# Patient Record
Sex: Male | Born: 1935 | Race: White | Hispanic: No | Smoking: Former smoker
Health system: Southern US, Community
[De-identification: ages and names within clinical notes are randomized; demographics above are authoritative.]

## PROBLEM LIST (undated history)

## (undated) DIAGNOSIS — J301 Allergic rhinitis due to pollen: Secondary | ICD-10-CM

## (undated) DIAGNOSIS — F329 Major depressive disorder, single episode, unspecified: Secondary | ICD-10-CM

## (undated) DIAGNOSIS — R350 Frequency of micturition: Secondary | ICD-10-CM

## (undated) DIAGNOSIS — R112 Nausea with vomiting, unspecified: Secondary | ICD-10-CM

## (undated) DIAGNOSIS — Z7189 Other specified counseling: Secondary | ICD-10-CM

## (undated) DIAGNOSIS — R5381 Other malaise: Secondary | ICD-10-CM

## (undated) DIAGNOSIS — R300 Dysuria: Secondary | ICD-10-CM

## (undated) DIAGNOSIS — F3289 Other specified depressive episodes: Secondary | ICD-10-CM

## (undated) DIAGNOSIS — K2100 Gastro-esophageal reflux disease with esophagitis, without bleeding: Secondary | ICD-10-CM

## (undated) DIAGNOSIS — G893 Neoplasm related pain (acute) (chronic): Secondary | ICD-10-CM

## (undated) DIAGNOSIS — R279 Unspecified lack of coordination: Secondary | ICD-10-CM

## (undated) DIAGNOSIS — M545 Low back pain, unspecified: Secondary | ICD-10-CM

## (undated) DIAGNOSIS — Z5111 Encounter for antineoplastic chemotherapy: Secondary | ICD-10-CM

## (undated) DIAGNOSIS — K219 Gastro-esophageal reflux disease without esophagitis: Secondary | ICD-10-CM

## (undated) DIAGNOSIS — I209 Angina pectoris, unspecified: Secondary | ICD-10-CM

## (undated) DIAGNOSIS — C4490 Unspecified malignant neoplasm of skin, unspecified: Secondary | ICD-10-CM

## (undated) DIAGNOSIS — A6000 Herpesviral infection of urogenital system, unspecified: Secondary | ICD-10-CM

## (undated) DIAGNOSIS — E559 Vitamin D deficiency, unspecified: Secondary | ICD-10-CM

## (undated) DIAGNOSIS — R519 Headache, unspecified: Secondary | ICD-10-CM

## (undated) DIAGNOSIS — R5383 Other fatigue: Secondary | ICD-10-CM

## (undated) DIAGNOSIS — M1990 Unspecified osteoarthritis, unspecified site: Secondary | ICD-10-CM

## (undated) DIAGNOSIS — G4730 Sleep apnea, unspecified: Secondary | ICD-10-CM

## (undated) DIAGNOSIS — I2510 Atherosclerotic heart disease of native coronary artery without angina pectoris: Secondary | ICD-10-CM

## (undated) DIAGNOSIS — R51 Headache: Secondary | ICD-10-CM

## (undated) DIAGNOSIS — E785 Hyperlipidemia, unspecified: Secondary | ICD-10-CM

## (undated) DIAGNOSIS — J189 Pneumonia, unspecified organism: Secondary | ICD-10-CM

## (undated) DIAGNOSIS — N400 Enlarged prostate without lower urinary tract symptoms: Secondary | ICD-10-CM

## (undated) DIAGNOSIS — R55 Syncope and collapse: Secondary | ICD-10-CM

## (undated) HISTORY — DX: Low back pain, unspecified: M54.50

## (undated) HISTORY — DX: Gastro-esophageal reflux disease with esophagitis, without bleeding: K21.00

## (undated) HISTORY — DX: Herpesviral infection of urogenital system, unspecified: A60.00

## (undated) HISTORY — DX: Dysuria: R30.0

## (undated) HISTORY — DX: Neoplasm related pain (acute) (chronic): G89.3

## (undated) HISTORY — DX: Unspecified osteoarthritis, unspecified site: M19.90

## (undated) HISTORY — PX: COLONOSCOPY: SHX174

## (undated) HISTORY — DX: Benign prostatic hyperplasia without lower urinary tract symptoms: N40.0

## (undated) HISTORY — DX: Unspecified lack of coordination: R27.9

## (undated) HISTORY — DX: Allergic rhinitis due to pollen: J30.1

## (undated) HISTORY — DX: Hyperlipidemia, unspecified: E78.5

## (undated) HISTORY — PX: EYE SURGERY: SHX253

## (undated) HISTORY — DX: Other malaise: R53.81

## (undated) HISTORY — DX: Syncope and collapse: R55

## (undated) HISTORY — DX: Vitamin D deficiency, unspecified: E55.9

## (undated) HISTORY — PX: TONSILLECTOMY: SUR1361

## (undated) HISTORY — DX: Other specified depressive episodes: F32.89

## (undated) HISTORY — DX: Low back pain: M54.5

## (undated) HISTORY — DX: Atherosclerotic heart disease of native coronary artery without angina pectoris: I25.10

## (undated) HISTORY — DX: Unspecified malignant neoplasm of skin, unspecified: C44.90

## (undated) HISTORY — DX: Nausea with vomiting, unspecified: R11.2

## (undated) HISTORY — DX: Gastro-esophageal reflux disease with esophagitis: K21.0

## (undated) HISTORY — DX: Encounter for antineoplastic chemotherapy: Z51.11

## (undated) HISTORY — DX: Major depressive disorder, single episode, unspecified: F32.9

## (undated) HISTORY — DX: Frequency of micturition: R35.0

## (undated) HISTORY — PX: VASECTOMY: SHX75

## (undated) HISTORY — DX: Other specified counseling: Z71.89

## (undated) HISTORY — DX: Other fatigue: R53.83

---

## 1983-10-13 HISTORY — PX: OTHER SURGICAL HISTORY: SHX169

## 1998-10-12 HISTORY — PX: CARDIAC CATHETERIZATION: SHX172

## 2009-10-12 HISTORY — PX: SKIN CANCER EXCISION: SHX779

## 2012-02-15 ENCOUNTER — Other Ambulatory Visit: Payer: Self-pay | Admitting: Internal Medicine

## 2012-02-15 DIAGNOSIS — I2510 Atherosclerotic heart disease of native coronary artery without angina pectoris: Secondary | ICD-10-CM

## 2012-02-17 ENCOUNTER — Ambulatory Visit
Admission: RE | Admit: 2012-02-17 | Discharge: 2012-02-17 | Disposition: A | Discharge status: Home / Self Care | Payer: Medicare Other | Source: Ambulatory Visit | Attending: Internal Medicine | Admitting: Internal Medicine

## 2012-02-17 DIAGNOSIS — I2510 Atherosclerotic heart disease of native coronary artery without angina pectoris: Secondary | ICD-10-CM

## 2013-01-04 ENCOUNTER — Ambulatory Visit: Payer: Self-pay | Admitting: Internal Medicine

## 2013-01-05 ENCOUNTER — Ambulatory Visit: Payer: Self-pay | Admitting: Nurse Practitioner

## 2013-01-06 ENCOUNTER — Ambulatory Visit (INDEPENDENT_AMBULATORY_CARE_PROVIDER_SITE_OTHER): Payer: Medicare Other | Admitting: Nurse Practitioner

## 2013-01-06 ENCOUNTER — Encounter: Payer: Self-pay | Admitting: Nurse Practitioner

## 2013-01-06 VITALS — BP 118/64 | HR 70 | Temp 97.0°F | Resp 16 | Ht 68.5 in | Wt 172.0 lb

## 2013-01-06 DIAGNOSIS — M1990 Unspecified osteoarthritis, unspecified site: Secondary | ICD-10-CM | POA: Insufficient documentation

## 2013-01-06 DIAGNOSIS — N400 Enlarged prostate without lower urinary tract symptoms: Secondary | ICD-10-CM

## 2013-01-06 DIAGNOSIS — I10 Essential (primary) hypertension: Secondary | ICD-10-CM

## 2013-01-06 DIAGNOSIS — K2100 Gastro-esophageal reflux disease with esophagitis, without bleeding: Secondary | ICD-10-CM | POA: Insufficient documentation

## 2013-01-06 DIAGNOSIS — F329 Major depressive disorder, single episode, unspecified: Secondary | ICD-10-CM

## 2013-01-06 DIAGNOSIS — E785 Hyperlipidemia, unspecified: Secondary | ICD-10-CM

## 2013-01-06 MED ORDER — PANTOPRAZOLE SODIUM 40 MG PO TBEC: 40.0000 mg | DELAYED_RELEASE_TABLET | Freq: Every day | ORAL | Status: DC

## 2013-01-06 NOTE — Assessment & Plan Note (Signed)
Will get lipids before next visit

## 2013-01-06 NOTE — Assessment & Plan Note (Signed)
Worse will have pt start protonix daily

## 2013-01-06 NOTE — Patient Instructions (Addendum)
Gastroesophageal Reflux Disease, Adult Gastroesophageal reflux disease (GERD) happens when acid from your stomach flows up into the esophagus. When acid comes in contact with the esophagus, the acid causes soreness (inflammation) in the esophagus. Over time, GERD may create small holes (ulcers) in the lining of the esophagus. CAUSES   Increased body weight. This puts pressure on the stomach, making acid rise from the stomach into the esophagus.  Smoking. This increases acid production in the stomach.  Drinking alcohol. This causes decreased pressure in the lower esophageal sphincter (valve or ring of muscle between the esophagus and stomach), allowing acid from the stomach into the esophagus.  Late evening meals and a full stomach. This increases pressure and acid production in the stomach.  A malformed lower esophageal sphincter. Sometimes, no cause is found. SYMPTOMS   Burning pain in the lower part of the mid-chest behind the breastbone and in the mid-stomach area. This may occur twice a week or more often.  Trouble swallowing.  Sore throat.  Dry cough.  Asthma-like symptoms including chest tightness, shortness of breath, or wheezing. DIAGNOSIS  Your caregiver may be able to diagnose GERD based on your symptoms. In some cases, X-rays and other tests may be done to check for complications or to check the condition of your stomach and esophagus. TREATMENT  Your caregiver may recommend over-the-counter or prescription medicines to help decrease acid production. Ask your caregiver before starting or adding any new medicines.  HOME CARE INSTRUCTIONS   Change the factors that you can control. Ask your caregiver for guidance concerning weight loss, quitting smoking, and alcohol consumption.  Avoid foods and drinks that make your symptoms worse, such as:  Caffeine or alcoholic drinks.  Chocolate.  Peppermint or mint flavorings.  Garlic and onions.  Spicy foods.  Citrus fruits,  such as oranges, lemons, or limes.  Tomato-based foods such as sauce, chili, salsa, and pizza.  Fried and fatty foods.  Avoid lying down for the 3 hours prior to your bedtime or prior to taking a nap.  Eat small, frequent meals instead of large meals.  Wear loose-fitting clothing. Do not wear anything tight around your waist that causes pressure on your stomach.  Raise the head of your bed 6 to 8 inches with wood blocks to help you sleep. Extra pillows will not help.  Only take over-the-counter or prescription medicines for pain, discomfort, or fever as directed by your caregiver.  Do not take aspirin, ibuprofen, or other nonsteroidal anti-inflammatory drugs (NSAIDs). SEEK IMMEDIATE MEDICAL CARE IF:   You have pain in your arms, neck, jaw, teeth, or back.  Your pain increases or changes in intensity or duration.  You develop nausea, vomiting, or sweating (diaphoresis).  You develop shortness of breath, or you faint.  Your vomit is green, yellow, black, or looks like coffee grounds or blood.  Your stool is red, bloody, or black. These symptoms could be signs of other problems, such as heart disease, gastric bleeding, or esophageal bleeding. MAKE SURE YOU:   Understand these instructions.  Will watch your condition.  Will get help right away if you are not doing well or get worse. Document Released: 07/08/2005 Document Revised: 12/21/2011 Document Reviewed: 04/17/2011 [facility] Patient Information 2013 [place], [place].   Depression, Adult Depression refers to feeling sad, low, down in the dumps, blue, gloomy, or empty. In general, there are two kinds of depression: 1. Depression that we all experience from time to time because of upsetting life experiences, including the loss of  a job or the ending of a relationship (normal sadness or normal grief). This kind of depression is considered normal, is short lived, and resolves within a few days to 2 weeks. (Depression experienced  after the loss of a loved one is called bereavement. Bereavement often lasts longer than 2 weeks but normally gets better with time.) 2. Clinical depression, which lasts longer than normal sadness or normal grief or interferes with your ability to function at home, at work, and in school. It also interferes with your personal relationships. It affects almost every aspect of your life. Clinical depression is an illness. Symptoms of depression also can be caused by conditions other than normal sadness and grief or clinical depression. Examples of these conditions are listed as follows:  Physical illness Some physical illnesses, including underactive thyroid gland (hypothyroidism), severe anemia, specific types of cancer, diabetes, uncontrolled seizures, heart and lung problems, strokes, and chronic pain are commonly associated with symptoms of depression.  Side effects of some prescription medicine In some people, certain types of prescription medicine can cause symptoms of depression.  Substance abuse Abuse of alcohol and illicit drugs can cause symptoms of depression. SYMPTOMS Symptoms of normal sadness and normal grief include the following:  Feeling sad or crying for short periods of time.  Not caring about anything (apathy).  Difficulty sleeping or sleeping too much.  No longer able to enjoy the things you used to enjoy.  Desire to be by oneself all the time (social isolation).  Lack of energy or motivation.  Difficulty concentrating or remembering.  Change in appetite or weight.  Restlessness or agitation. Symptoms of clinical depression include the same symptoms of normal sadness or normal grief and also the following symptoms:  Feeling sad or crying all the time.  Feelings of guilt or worthlessness.  Feelings of hopelessness or helplessness.  Thoughts of suicide or the desire to harm yourself (suicidal ideation).  Loss of touch with reality (psychotic symptoms). Seeing or  hearing things that are not real (hallucinations) or having false beliefs about your life or the people around you (delusions and paranoia). DIAGNOSIS  The diagnosis of clinical depression usually is based on the severity and duration of the symptoms. Your caregiver also will ask you questions about your medical history and substance use to find out if physical illness, use of prescription medicine, or substance abuse is causing your depression. Your caregiver also may order blood tests. TREATMENT  Typically, normal sadness and normal grief do not require treatment. However, sometimes antidepressant medicine is prescribed for bereavement to ease the depressive symptoms until they resolve. The treatment for clinical depression depends on the severity of your symptoms but typically includes antidepressant medicine, counseling with a mental health professional, or a combination of both. Your caregiver will help to determine what treatment is best for you. Depression caused by physical illness usually goes away with appropriate medical treatment of the illness. If prescription medicine is causing depression, talk with your caregiver about stopping the medicine, decreasing the dose, or substituting another medicine. Depression caused by abuse of alcohol or illicit drugs abuse goes away with abstinence from these substances. Some adults need professional help in order to stop drinking or using drugs. SEEK IMMEDIATE CARE IF:  You have thoughts about hurting yourself or others.  You lose touch with reality (have psychotic symptoms).  You are taking medicine for depression and have a serious side effect. FOR MORE INFORMATION National [employer] on Mental Illness: www.nami.[employer]:  http://www.maynard.net/ Document Released: 09/25/2000 Document Revised: 03/29/2012 Document Reviewed: 12/28/2011 [facility] Patient Information 2013 [place], [place].

## 2013-01-06 NOTE — Progress Notes (Signed)
Patient ID: Christian Munoz, male   DOB: [birth year], 78 y.o.   MRN: [id number]  No Known Allergies  Chief Complaint  Patient presents with  . Medical Managment of Chronic Issues  . Depression  . Hypertension  . hyperlipidema    HPI: Patient is a 78 y.o. male seen in the office today for routine follow up Reports depression is worse- needs referral to psych Some days he does not even get dressed. Small things like going to the store is a success. Currently taking bupropion ER 150 BID and cymbalta 60mg  2 tablets daily. Has been on several medications without success. Did not tolerate Zolft- could not sleep; lost weight; suicidal thoughts and increased anxiety and depression  Currently no suicidal thoughts  Review of Systems:  Review of Systems  Constitutional: Negative.   HENT: Positive for hearing loss (wears hearing aids). Negative for nosebleeds and ear discharge.   Eyes:       Thinks there has been a change in vision- seeing eye MD next week  Respiratory: Negative.  Negative for shortness of breath and wheezing.   Cardiovascular: Negative for chest pain and palpitations.  Gastrointestinal: Positive for heartburn. Negative for nausea, vomiting, abdominal pain, diarrhea and constipation.       Acid reflux worse at night  Genitourinary: Negative.   Musculoskeletal: Negative.        Sharp pains on bottoms of feet- tylenol helped   Skin: Negative.        Sees dermatology regularly   Neurological: Negative for dizziness, tingling and headaches.       Sometimes looses his balance- randomly occurs- no falls   Psychiatric/Behavioral: Positive for depression. Negative for suicidal ideas. The patient is not nervous/anxious.      Past Medical History  Diagnosis Date  . Syncope and collapse   . Lack of coordination   . Allergic rhinitis due to pollen   . Herpes genitalia   . Vitamin D deficiency   . Other and unspecified hyperlipidemia   . Depressive disorder, not elsewhere  classified   . Coronary atherosclerosis of native coronary artery   . Reflux esophagitis   . Hypertrophy of prostate without urinary obstruction and other lower urinary tract symptoms (LUTS)   . Osteoarthrosis, unspecified whether generalized or localized, unspecified site   . Lumbago   . Other malaise and fatigue   . Urinary frequency    No past surgical history on file. Social History:   reports that he has been smoking Cigarettes.  He has been smoking about 0.00 packs per day. He does not have any smokeless tobacco history on file. His alcohol and drug histories are not on file. Medications: Patient's Medications  New Prescriptions   PANTOPRAZOLE (PROTONIX) 40 MG TABLET    Take 1 tablet (40 mg total) by mouth daily.  Previous Medications   ACYCLOVIR (ZOVIRAX) 800 MG TABLET    Take 800 mg by mouth 2 (two) times daily. Take one tablet once a day to help prevent flare up   ASPIRIN 81 MG TABLET    Take 81 mg by mouth daily. Take one tablet once a day   BUPROPION (WELLBUTRIN SR) 150 MG 12 HR TABLET    Take 150 mg by mouth 2 (two) times daily. Take one tablet twice a day as needed for depression   CALCIUM CARBONATE (OS-CAL) 600 MG TABS    Take 600 mg by mouth 2 (two) times daily with a meal. Take one tablet once a day for calcium  supplement   DULOXETINE (CYMBALTA) 60 MG CAPSULE    Take 60 mg by mouth daily. Take one tablet twice a day for depression   FISH OIL-OMEGA-3 FATTY ACIDS 1000 MG CAPSULE    Take 2 g by mouth daily. Take one tablet twice a day   METOPROLOL (LOPRESSOR) 50 MG TABLET    Take 50 mg by mouth 2 (two) times daily. Take one tablet twice a day for blood pressure   MULTIPLE VITAMINS-MINERALS (VISION FORMULA PO)    Take by mouth. Take one tablet once a day to help with vision   RANITIDINE (ZANTAC) 150 MG TABLET    Take 150 mg by mouth 2 (two) times daily. Take one tablet once a day for acid reflux   SIMVASTATIN (ZOCOR) 20 MG TABLET    Take 20 mg by mouth every evening. Take one  tablet once a day for cholesterol   TAMSULOSIN (FLOMAX) 0.4 MG CAPS    Take by mouth. Take one tablet once a day for prostate  Modified Medications   No medications on file  Discontinued Medications   No medications on file     Physical Exam: Physical Exam  Constitutional: He is oriented to person, place, and time. He appears well-developed and well-nourished. No distress.  HENT:  Head: Normocephalic and atraumatic.  Eyes: EOM are normal. Pupils are equal, round, and reactive to light.  Neck: Normal range of motion. Neck supple.  Cardiovascular: Normal rate, regular rhythm and normal heart sounds.   Pulmonary/Chest: Effort normal and breath sounds normal.  Abdominal: Soft. Bowel sounds are normal.  Musculoskeletal: Normal range of motion.  Neurological: He is alert and oriented to person, place, and time.  Skin: Skin is warm and dry. He is not diaphoretic.  Psychiatric: He has a normal mood and affect.  (type .physexam) Filed Vitals:   01/06/13 1028  BP: 118/64  Pulse: 70  Temp: 97 F (36.1 C)  Resp: 16  Height: 5' 8.5" (1.74 m)  Weight: 172 lb (78.019 kg)   Labs reviewed    Assessment/Plan Unspecified essential hypertension Patient is stable; continue current regimen. Will monitor and make changes as necessary.  Reflux esophagitis Worse will have pt start protonix daily   Osteoarthrosis, unspecified whether generalized or localized, unspecified site Patient is stable; continue current regimen. Will monitor and make changes as necessary.  Depressive disorder, not elsewhere classified unchanged-pt has tried different medications without success - will refer to psych    Other and unspecified hyperlipidemia Will get lipids before next visit   Hypertrophy of prostate without urinary obstruction and other lower urinary tract symptoms (LUTS) Patient is stable; continue current regimen. Will monitor and make changes as necessary.  depression scale of 7

## 2013-01-06 NOTE — Assessment & Plan Note (Signed)
unchanged-pt has tried different medications without success - will refer to psych

## 2013-01-06 NOTE — Assessment & Plan Note (Signed)
Patient is stable; continue current regimen. Will monitor and make changes as necessary.  

## 2013-01-09 ENCOUNTER — Telehealth: Payer: Self-pay | Admitting: Nurse Practitioner

## 2013-01-09 NOTE — Telephone Encounter (Signed)
Called [employer].  Their process is to register the patient into their system and have the patient to call and schedule an appointment along with a refundable deposit of $ 150.00.  Christian Munoz has been registered.  I called Christian Munoz and left this information on his ans machine. cdavis 01-09-2013

## 2013-01-30 ENCOUNTER — Encounter: Payer: Self-pay | Admitting: *Deleted

## 2013-01-30 ENCOUNTER — Other Ambulatory Visit: Payer: Self-pay | Admitting: *Deleted

## 2013-01-30 MED ORDER — METOPROLOL TARTRATE 25 MG PO TABS: 25.0000 mg | ORAL_TABLET | Freq: Two times a day (BID) | ORAL | Status: DC

## 2013-01-31 ENCOUNTER — Ambulatory Visit (INDEPENDENT_AMBULATORY_CARE_PROVIDER_SITE_OTHER): Payer: Medicare Other | Admitting: Internal Medicine

## 2013-01-31 ENCOUNTER — Encounter: Payer: Self-pay | Admitting: Internal Medicine

## 2013-01-31 VITALS — BP 124/92 | HR 64 | Temp 97.6°F | Resp 20 | Ht 69.5 in | Wt 174.0 lb

## 2013-01-31 DIAGNOSIS — R5381 Other malaise: Secondary | ICD-10-CM

## 2013-01-31 DIAGNOSIS — E785 Hyperlipidemia, unspecified: Secondary | ICD-10-CM

## 2013-01-31 DIAGNOSIS — I10 Essential (primary) hypertension: Secondary | ICD-10-CM

## 2013-01-31 NOTE — Patient Instructions (Addendum)

## 2013-01-31 NOTE — Progress Notes (Signed)
  Subjective:    Patient ID: Christian Munoz, male    DOB: [birth year], 78 y.o.   MRN: [id number]  CC- hypertension, BPH, depression  HPI Patient here for routine follow up. He has been gaining weight.on review of our records, he weighed 172 lbs 6 months back and today he weighs 174 lb.  Has been having his hearing aid ifxed so hard of hearing today Miss his lab appointment. Can do his labs today Has not had his medications this am. Blood pressure slightly elevated this am. Denies headache or chest pain or SOB. No abdominal pain. Seen by Candelaria Celeste NP on routine basis Denies any complaints  Review of Systems  Constitutional: Negative for fever, activity change, appetite change and unexpected weight change.  HENT: Positive for hearing loss. Negative for congestion, neck pain and sinus pressure.   Eyes: Negative for visual disturbance.  Respiratory: Negative for cough and shortness of breath.   Cardiovascular: Negative for chest pain, palpitations and leg swelling.  Gastrointestinal: Negative for abdominal pain, constipation and abdominal distention.  Genitourinary: Negative for dysuria.  Musculoskeletal: Negative for back pain and arthralgias.  Neurological: Negative for dizziness, tremors and syncope.  Hematological: Negative for adenopathy.  Psychiatric/Behavioral: Negative for behavioral problems and agitation.       Objective:   Physical Exam  Constitutional: He is oriented to person, place, and time. He appears well-developed and well-nourished. No distress.  HENT:  Head: Normocephalic and atraumatic.  Mouth/Throat: Oropharynx is clear and moist.  Eyes: Conjunctivae are normal. Pupils are equal, round, and reactive to light.  Neck: Normal range of motion. Neck supple.  Cardiovascular: Normal rate and regular rhythm.   Pulmonary/Chest: Effort normal and breath sounds normal.  Abdominal: Soft. Bowel sounds are normal.  Musculoskeletal: Normal range of motion. He exhibits  no edema and no tenderness.  Lymphadenopathy:    He has no cervical adenopathy.  Neurological: He is alert and oriented to person, place, and time.  Skin: Skin is warm and dry. No rash noted. He is not diaphoretic. No pallor.  Psychiatric: He has a normal mood and affect. His behavior is normal.   BP 124/92  Pulse 64  Temp(Src) 97.6 F (36.4 C) (Oral)  Resp 20  Ht 5' 9.5" (1.765 m)  Wt 174 lb (78.926 kg)  BMI 25.34 kg/m2  SpO2 97%      Assessment & Plan:   Reflux esophagitis- under control with current dose of PPI  HTN- has not taken medication this am. Slightly elevated bp in office. Tries to be careful with his meal.he does treadmill and light weight and bicycle upto 40 min every other day.check bmp  Osteoarthritis- symptoms are under control. Heartburn under control. Not on any medication at r   Depression- stable with bupropion and duloxetine for now. Has occassional mood swings and low energy level. Has not been able to see his psychiatrist in town recently.  Hyperlipidemia- check flp today with lft. Continue simvasttain 20 mg daily.  counselled about diet and exercise and stopping smoking. Patient has cut down from a pack a day to half a pack a day for now  BPH- symptoms improved with current regimen of flomax. No problem reported

## 2013-02-01 LAB — COMPREHENSIVE METABOLIC PANEL
ALT: 19 IU/L (ref 0–44)
AST: 15 IU/L (ref 0–40)
Albumin/Globulin Ratio: 1.8 (ref 1.1–2.5)
CO2: 25 mmol/L (ref 19–28)
Calcium: 9.2 mg/dL (ref 8.6–10.2)
Chloride: 102 mmol/L (ref 97–108)
GFR calc non Af Amer: 58 mL/min/{1.73_m2} — ABNORMAL LOW (ref >59)
Glucose: 95 mg/dL (ref 65–99)
Potassium: 4.5 mmol/L (ref 3.5–5.2)
Sodium: 139 mmol/L (ref 134–144)
Total Protein: 6.2 g/dL (ref 6.0–8.5)

## 2013-02-01 LAB — LIPID PANEL
Cholesterol, Total: 140 mg/dL (ref 100–199)
HDL: 32 mg/dL — ABNORMAL LOW (ref >39)
VLDL Cholesterol Cal: 40 mg/dL (ref 5–40)

## 2013-02-01 LAB — CBC WITH DIFFERENTIAL/PLATELET
Basos: 0 % (ref 0–3)
Eos: 2 % (ref 0–5)
Eosinophils Absolute: 0.1 10*3/uL (ref 0.0–0.4)
HCT: 43.1 % (ref 37.5–51.0)
Immature Granulocytes: 0 % (ref 0–2)
Lymphocytes Absolute: 4.3 10*3/uL — ABNORMAL HIGH (ref 0.7–3.1)
MCH: 29.5 pg (ref 26.6–33.0)
MCV: 90 fL (ref 79–97)
Monocytes Absolute: 0.5 10*3/uL (ref 0.1–0.9)
RDW: 14.2 % (ref 12.3–15.4)
WBC: 9.1 10*3/uL (ref 3.4–10.8)

## 2013-03-03 ENCOUNTER — Telehealth: Payer: Self-pay | Admitting: Nurse Practitioner

## 2013-03-03 NOTE — Telephone Encounter (Signed)
03-03-2012, Christian Munoz called the office today Says [facility] did not call him back for an appointment. Their appointment process is for the referring office is to fax patients notes and then have the patient to call for an appt-notes were faced..  Christian Munoz says he called, the they were going to call him back,  but they never call him back.  Says he never call them back as well.  Now, he wants another referral to another facility.  I call [name].  They are presently not taking any new patients.   Call Christian Munoz back, left message for him to call me back.  cdavis 03-03-2013

## 2013-03-07 ENCOUNTER — Telehealth: Payer: Self-pay | Admitting: Nurse Practitioner

## 2013-03-07 NOTE — Telephone Encounter (Signed)
Called [facility] per patients request for an appointment.  Says they are not accepting new patients at this time.  Says they have a large volume of patients waiting for appointments already.  They did not want to accept anymore pts. Called pt and explained.  Says he will call his ins company again and call me back...fyi to provider...cdavis

## 2013-04-24 ENCOUNTER — Other Ambulatory Visit: Payer: Medicare Other

## 2013-04-24 DIAGNOSIS — N400 Enlarged prostate without lower urinary tract symptoms: Secondary | ICD-10-CM

## 2013-04-24 DIAGNOSIS — F329 Major depressive disorder, single episode, unspecified: Secondary | ICD-10-CM

## 2013-04-24 DIAGNOSIS — E785 Hyperlipidemia, unspecified: Secondary | ICD-10-CM

## 2013-04-25 LAB — CBC WITH DIFFERENTIAL/PLATELET
Basos: 0 % (ref 0–3)
Eosinophils Absolute: 0.2 10*3/uL (ref 0.0–0.4)
Hemoglobin: 14.7 g/dL (ref 12.6–17.7)
MCH: 29.8 pg (ref 26.6–33.0)
MCHC: 33 g/dL (ref 31.5–35.7)
MCV: 90 fL (ref 79–97)
Monocytes Absolute: 0.5 10*3/uL (ref 0.1–0.9)
Neutrophils Absolute: 3.8 10*3/uL (ref 1.4–7.0)
RBC: 4.94 x10E6/uL (ref 4.14–5.80)

## 2013-04-25 LAB — LIPID PANEL
Chol/HDL Ratio: 3.9 ratio units (ref 0.0–5.0)
Cholesterol, Total: 131 mg/dL (ref 100–199)
HDL: 34 mg/dL — ABNORMAL LOW (ref >39)
LDL Calculated: 72 mg/dL (ref 0–99)
Triglycerides: 126 mg/dL (ref 0–149)

## 2013-04-25 LAB — TSH: TSH: 4.13 u[IU]/mL (ref 0.450–4.500)

## 2013-04-25 LAB — COMPREHENSIVE METABOLIC PANEL
Albumin: 4.1 g/dL (ref 3.5–4.8)
BUN/Creatinine Ratio: 20 (ref 10–22)
CO2: 22 mmol/L (ref 18–29)
Calcium: 9 mg/dL (ref 8.6–10.2)
Chloride: 103 mmol/L (ref 97–108)
GFR calc Af Amer: 66 mL/min/{1.73_m2} (ref >59)
GFR calc non Af Amer: 57 mL/min/{1.73_m2} — ABNORMAL LOW (ref >59)
Globulin, Total: 2.1 g/dL (ref 1.5–4.5)
Potassium: 4 mmol/L (ref 3.5–5.2)
Sodium: 141 mmol/L (ref 134–144)

## 2013-04-27 ENCOUNTER — Encounter: Payer: Self-pay | Admitting: Nurse Practitioner

## 2013-04-27 ENCOUNTER — Ambulatory Visit (INDEPENDENT_AMBULATORY_CARE_PROVIDER_SITE_OTHER): Payer: Medicare Other | Admitting: Nurse Practitioner

## 2013-04-27 VITALS — BP 138/84 | HR 67 | Temp 98.6°F | Resp 13 | Ht 69.5 in | Wt 171.6 lb

## 2013-04-27 DIAGNOSIS — N400 Enlarged prostate without lower urinary tract symptoms: Secondary | ICD-10-CM

## 2013-04-27 DIAGNOSIS — F329 Major depressive disorder, single episode, unspecified: Secondary | ICD-10-CM

## 2013-04-27 DIAGNOSIS — E785 Hyperlipidemia, unspecified: Secondary | ICD-10-CM

## 2013-04-27 DIAGNOSIS — I10 Essential (primary) hypertension: Secondary | ICD-10-CM

## 2013-04-27 NOTE — Progress Notes (Signed)
Patient ID: Christian Munoz, male   DOB: [birth year], 78 y.o.   MRN: [id number]   Allergies  Allergen Reactions  . Chantix (Varenicline)     Makes patient suicidal  . Zoloft (Sertraline Hcl)     Makes patient suicidal    Chief Complaint  Patient presents with  . Medical Managment of Chronic Issues    HPI: Patient is a 78 y.o. 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  Review of Systems:  Review of Systems  Constitutional: Positive for weight loss (trying to eat better- with 2 lbs intentional weight loss).  Respiratory: Negative for cough and shortness of breath.   Cardiovascular: Negative for chest pain and palpitations.  Gastrointestinal: Negative for heartburn, diarrhea and constipation.  Genitourinary: Negative for dysuria, urgency and frequency.  Musculoskeletal: Negative.   Skin: Negative.   Neurological: Negative for weakness.    Past Medical History  Diagnosis Date  . Syncope and collapse   . Lack of coordination   . Allergic rhinitis due to pollen   . Herpes genitalia   . Vitamin D deficiency   . Other and unspecified hyperlipidemia   . Depressive disorder, not elsewhere classified   . Coronary atherosclerosis of native coronary artery   . Reflux esophagitis   . Hypertrophy of prostate without urinary obstruction and other lower urinary tract symptoms (LUTS)   . Osteoarthrosis, unspecified whether generalized or localized, unspecified site   . Lumbago   . Other malaise and fatigue   . Urinary frequency   . Lack of coordination   . Coronary atherosclerosis of native coronary artery   . Hypertrophy of prostate without urinary obstruction and other lower urinary tract symptoms (LUTS)   . Osteoarthrosis, unspecified  whether generalized or localized, unspecified site    Past Surgical History  Procedure Laterality Date  . Poly vocal cords  1985  . Skin cancer excision  2011  . Cardiac catheterization     Social History:   reports that he has been smoking Cigarettes.  He has been smoking about 0.00 packs per day. He does not have any smokeless tobacco history on file. His alcohol and drug histories are not on file.  Family History  Problem Relation Age of Onset  . Heart disease Father   . Cancer Father   . Cancer Brother   . Cancer Brother     Medications: Patient's Medications  New Prescriptions   No medications on file  Previous Medications   ACYCLOVIR (ZOVIRAX) 800 MG TABLET    Take 800 mg by mouth 2 (two) times daily. Take one tablet once a day to help prevent flare up   ASPIRIN 81 MG TABLET    Take 81 mg by mouth daily. Take one tablet once a day   BUPROPION (WELLBUTRIN SR) 150 MG 12 HR TABLET    Take 150 mg by mouth 2 (two) times daily. Take one tablet twice a day as needed for depression   CALCIUM CARBONATE (OS-CAL) 600 MG TABS    Take 600 mg by mouth 2 (two) times daily with a meal. Take one tablet once a day for calcium supplement   DULOXETINE (CYMBALTA) 60 MG CAPSULE    Take 60 mg by mouth daily.  Take one tablet twice a day for depression   FISH OIL-OMEGA-3 FATTY ACIDS 1000 MG CAPSULE    Take 2 g by mouth daily. Take one tablet twice a day   METOPROLOL TARTRATE (LOPRESSOR) 25 MG TABLET    Take 1 tablet (25 mg total) by mouth 2 (two) times daily.   PANTOPRAZOLE (PROTONIX) 40 MG TABLET    Take 1 tablet (40 mg total) by mouth daily.   SIMVASTATIN (ZOCOR) 20 MG TABLET    Take 20 mg by mouth every evening. Take one tablet once a day for cholesterol   TAMSULOSIN (FLOMAX) 0.4 MG CAPS    Take by mouth. Take one tablet once a day for prostate  Modified Medications   No medications on file  Discontinued Medications   No medications on file     Physical Exam:  Filed Vitals:   04/27/13  1024  BP: 138/84  Pulse: 67  Temp: 98.6 F (37 C)  TempSrc: Oral  Resp: 13  Height: 5' 9.5" (1.765 m)  Weight: 171 lb 9.6 oz (77.837 kg)    Physical Exam  Constitutional: He is oriented to person, place, and time and well-developed, well-nourished, and in no distress. No distress.  HENT:  Head: Normocephalic and atraumatic.  Eyes: Conjunctivae and EOM are normal. Pupils are equal, round, and reactive to light.  Neck: Normal range of motion. Neck supple. No thyromegaly present.  Cardiovascular: Normal rate, regular rhythm and normal heart sounds.   Pulmonary/Chest: Effort normal and breath sounds normal.  Abdominal: Soft. Bowel sounds are normal. He exhibits no distension.  Musculoskeletal: Normal range of motion. He exhibits no tenderness.  Lymphadenopathy:    He has no cervical adenopathy.  Neurological: He is alert and oriented to person, place, and time.  Skin: Skin is warm and dry. He is not diaphoretic.     Labs reviewed: Basic Metabolic Panel:  Recent Labs  16/10/96 1016 04/24/13 0933  NA 139 141  K 4.5 4.0  CL 102 103  CO2 25 22  GLUCOSE 95 102*  BUN 20 24  CREATININE 1.22 1.23  CALCIUM 9.2 9.0  TSH 4.820* 4.130   Liver Function Tests:  Recent Labs  01/31/13 1016 04/24/13 0933  AST 15 17  ALT 19 13  ALKPHOS 83 83  BILITOT 0.3 0.3  PROT 6.2 6.2   No results found for this basename: LIPASE, AMYLASE,  in the last 8760 hours No results found for this basename: AMMONIA,  in the last 8760 hours CBC:  Recent Labs  01/31/13 1016 04/24/13 0933  WBC 9.1 9.0  NEUTROABS 4.0 3.8  HGB 14.1 14.7  HCT 43.1 44.6  MCV 90 90   Lipid Panel:  Recent Labs  01/31/13 1016 04/24/13 0933  HDL 32* 34*  LDLCALC 68 72  TRIG 199* 126  CHOLHDL 4.4 3.9    Assessment/Plan   1.   Other and unspecified hyperlipidemia 272.4   - stable; no side effects noted from medications; cont diet and exercise    2.   Depressive disorder, not elsewhere classified 311   -  has improved; no thoughts of hurting himself or others; cont Wellbutrin and cymalta   3.   Hypertrophy of prostate without urinary obstruction and other lower urinary tract symptoms (LUTS) 600.00   - stable on current medications   4.   Unspecified essential hypertension 401.9   Patient is stable; continue current regimen. Will monitor and make changes as necessary   5.   Reflux esophagitis  stable on protonix

## 2013-05-15 ENCOUNTER — Other Ambulatory Visit: Payer: Self-pay | Admitting: Geriatric Medicine

## 2013-05-15 MED ORDER — SIMVASTATIN 20 MG PO TABS: 20.0000 mg | ORAL_TABLET | Freq: Every evening | ORAL | Status: DC

## 2013-05-19 ENCOUNTER — Other Ambulatory Visit: Payer: Self-pay | Admitting: Geriatric Medicine

## 2013-05-19 MED ORDER — PANTOPRAZOLE SODIUM 40 MG PO TBEC: 40.0000 mg | DELAYED_RELEASE_TABLET | Freq: Every day | ORAL | Status: DC

## 2013-05-23 ENCOUNTER — Encounter: Payer: Self-pay | Admitting: Nurse Practitioner

## 2013-05-23 ENCOUNTER — Ambulatory Visit (INDEPENDENT_AMBULATORY_CARE_PROVIDER_SITE_OTHER): Payer: Medicare Other | Admitting: Nurse Practitioner

## 2013-05-23 VITALS — BP 118/70 | HR 65 | Temp 97.4°F | Resp 18 | Ht 69.0 in | Wt 173.8 lb

## 2013-05-23 DIAGNOSIS — M25569 Pain in unspecified knee: Secondary | ICD-10-CM

## 2013-05-23 DIAGNOSIS — M25561 Pain in right knee: Secondary | ICD-10-CM

## 2013-05-23 NOTE — Patient Instructions (Addendum)
May take tylenol 325 mg 2 tablet - take every 8 hours as needed Start taking twice daily at breakfast and dinner for now

## 2013-05-23 NOTE — Progress Notes (Signed)
Patient ID: Christian Munoz, male   DOB: October 08, 1935, 78 y.o.   MRN: [id number]   Allergies  Allergen Reactions  . Chantix (Varenicline)     Makes patient suicidal  . Zoloft (Sertraline Hcl)     Makes patient suicidal    Chief Complaint  Patient presents with  . Acute Visit    rt knee pain, used a heating patch, approx 8 hrs    HPI: Patient is a 78 y.o. male seen in the office today for right knee pain. Pain has been off and on for pain years. No injury. Can not exercise when it hurts. Reports he has not had any imagining but does have  OA. Has not tried any medication. Has tried medicated patches (icyhot) this helped. Sits with knees crossed and on the floor and when he gets up he has pain. No pain at night.   Review of Systems:  Review of Systems  Constitutional: Negative for fever and chills.  HENT: Negative for neck pain.   Respiratory: Negative for shortness of breath.   Cardiovascular: Negative for chest pain.  Musculoskeletal: Positive for joint pain (knee pain). Negative for myalgias, back pain and falls.  Skin: Negative.   Neurological: Negative for dizziness, tingling and weakness.     Past Medical History  Diagnosis Date  . Syncope and collapse   . Lack of coordination   . Allergic rhinitis due to pollen   . Herpes genitalia   . Vitamin D deficiency   . Other and unspecified hyperlipidemia   . Depressive disorder, not elsewhere classified   . Coronary atherosclerosis of native coronary artery   . Reflux esophagitis   . Hypertrophy of prostate without urinary obstruction and other lower urinary tract symptoms (LUTS)   . Osteoarthrosis, unspecified whether generalized or localized, unspecified site   . Lumbago   . Other malaise and fatigue   . Urinary frequency   . Lack of coordination   . Coronary atherosclerosis of native coronary artery   . Hypertrophy of prostate without urinary obstruction and other lower urinary tract symptoms (LUTS)   .  Osteoarthrosis, unspecified whether generalized or localized, unspecified site    Past Surgical History  Procedure Laterality Date  . Poly vocal cords  1985  . Skin cancer excision  2011  . Cardiac catheterization     Social History:   reports that he has been smoking Cigarettes.  He has been smoking about 0.00 packs per day. He does not have any smokeless tobacco history on file. His alcohol and drug histories are not on file.  Family History  Problem Relation Age of Onset  . Heart disease Father   . Cancer Father   . Cancer Brother   . Cancer Brother     Medications: Patient's Medications  New Prescriptions   No medications on file  Previous Medications   ACYCLOVIR (ZOVIRAX) 800 MG TABLET    Take 800 mg by mouth 2 (two) times daily. Take one tablet once a day to help prevent flare up   ASPIRIN 81 MG TABLET    Take 81 mg by mouth daily. Take one tablet once a day   BUPROPION (WELLBUTRIN SR) 150 MG 12 HR TABLET    Take 150 mg by mouth 2 (two) times daily. Take one tablet twice a day as needed for depression   CALCIUM CARBONATE (OS-CAL) 600 MG TABS    Take 600 mg by mouth 2 (two) times daily with a meal. Take one tablet once a  day for calcium supplement   DULOXETINE (CYMBALTA) 60 MG CAPSULE    Take 60 mg by mouth daily.    FISH OIL-OMEGA-3 FATTY ACIDS 1000 MG CAPSULE    Take 2 g by mouth daily. Take one tablet twice a day   METOPROLOL TARTRATE (LOPRESSOR) 25 MG TABLET    Take 1 tablet (25 mg total) by mouth 2 (two) times daily.   PANTOPRAZOLE (PROTONIX) 40 MG TABLET    Take 1 tablet (40 mg total) by mouth daily.   SIMVASTATIN (ZOCOR) 20 MG TABLET    Take 1 tablet (20 mg total) by mouth every evening. Take one tablet once a day for cholesterol   TAMSULOSIN (FLOMAX) 0.4 MG CAPS    Take by mouth. Take one tablet once a day for prostate  Modified Medications   No medications on file  Discontinued Medications   No medications on file     Physical Exam:  Filed Vitals:   05/23/13  1123  BP: 118/70  Pulse: 65  Temp: 97.4 F (36.3 C)  TempSrc: Oral  Resp: 18  Height: 5\' 9"  (1.753 m)  Weight: 173 lb 12.8 oz (78.835 kg)  SpO2: 95%    Physical Exam  Constitutional: He is well-developed, well-nourished, and in no distress. No distress.  Cardiovascular: Normal rate, regular rhythm and normal heart sounds.   Pulmonary/Chest: Effort normal and breath sounds normal.  Musculoskeletal:       Right knee: Normal. He exhibits normal range of motion, no swelling and no effusion. No tenderness found.       Left knee: Normal. He exhibits normal range of motion, no swelling and no effusion. No tenderness found.  Skin: He is not diaphoretic.     Labs reviewed: Basic Metabolic Panel:  Recent Labs  16/10/96 1016 04/24/13 0933  NA 139 141  K 4.5 4.0  CL 102 103  CO2 25 22  GLUCOSE 95 102*  BUN 20 24  CREATININE 1.22 1.23  CALCIUM 9.2 9.0  TSH 4.820* 4.130   Liver Function Tests:  Recent Labs  01/31/13 1016 04/24/13 0933  AST 15 17  ALT 19 13  ALKPHOS 83 83  BILITOT 0.3 0.3  PROT 6.2 6.2   No results found for this basename: LIPASE, AMYLASE,  in the last 8760 hours No results found for this basename: AMMONIA,  in the last 8760 hours CBC:  Recent Labs  01/31/13 1016 04/24/13 0933  WBC 9.1 9.0  NEUTROABS 4.0 3.8  HGB 14.1 14.7  HCT 43.1 44.6  MCV 90 90   Lipid Panel:  Recent Labs  01/31/13 1016 04/24/13 0933  HDL 32* 34*  LDLCALC 68 72  TRIG 199* 126  CHOLHDL 4.4 3.9    Assessment/Plan 1. Knee pain, right Pt currently not taking any medication for pain in knee- most likey OA due to history; for now may take tylenol 325 mg 2 tablet - take every 8 hours as needed; Start taking twice daily at breakfast and dinner. Pt to cont exercise as tolerated; muscle strength training, discussed getting PT referral but he would like to hold off at this time. To follow up as needed

## 2013-06-09 ENCOUNTER — Other Ambulatory Visit: Payer: Self-pay | Admitting: Nurse Practitioner

## 2013-07-27 ENCOUNTER — Other Ambulatory Visit: Payer: Self-pay | Admitting: Internal Medicine

## 2013-08-24 ENCOUNTER — Other Ambulatory Visit: Payer: Self-pay | Admitting: Internal Medicine

## 2013-09-28 ENCOUNTER — Other Ambulatory Visit: Payer: Self-pay | Admitting: Internal Medicine

## 2013-10-28 ENCOUNTER — Other Ambulatory Visit: Payer: Self-pay | Admitting: Internal Medicine

## 2013-11-30 ENCOUNTER — Other Ambulatory Visit: Payer: Self-pay | Admitting: Internal Medicine

## 2013-12-04 ENCOUNTER — Telehealth: Payer: Self-pay | Admitting: *Deleted

## 2013-12-04 NOTE — Telephone Encounter (Signed)
Patient called and stated that he swallowed an hearing aid battery. Instructed patient to go to ER and patient refused. Told patient to call [facility] to get their advise # [phone] and he agreed.  I called patient to check on him and see what Poison control said and they told him that he would pass it but they was concerned with it getting stuck and suggested him having a x-Ray. Patient wanted to make an appointment here to go have that done instead of going to Urgent Care. Scheduled an appointment for Wednesday with Hassell Done. Told patient that if any symptoms occur he needed to go to the ER. Patient agreed.

## 2013-12-06 ENCOUNTER — Encounter: Payer: Self-pay | Admitting: Nurse Practitioner

## 2013-12-06 ENCOUNTER — Ambulatory Visit (INDEPENDENT_AMBULATORY_CARE_PROVIDER_SITE_OTHER): Payer: Medicare Other | Admitting: Nurse Practitioner

## 2013-12-06 VITALS — BP 116/70 | HR 64 | Temp 96.6°F | Wt 174.2 lb

## 2013-12-06 DIAGNOSIS — F329 Major depressive disorder, single episode, unspecified: Secondary | ICD-10-CM

## 2013-12-06 DIAGNOSIS — F3289 Other specified depressive episodes: Secondary | ICD-10-CM

## 2013-12-06 DIAGNOSIS — K2100 Gastro-esophageal reflux disease with esophagitis, without bleeding: Secondary | ICD-10-CM

## 2013-12-06 DIAGNOSIS — E785 Hyperlipidemia, unspecified: Secondary | ICD-10-CM

## 2013-12-06 DIAGNOSIS — I10 Essential (primary) hypertension: Secondary | ICD-10-CM

## 2013-12-06 DIAGNOSIS — T189XXA Foreign body of alimentary tract, part unspecified, initial encounter: Secondary | ICD-10-CM

## 2013-12-06 DIAGNOSIS — N400 Enlarged prostate without lower urinary tract symptoms: Secondary | ICD-10-CM

## 2013-12-06 NOTE — Progress Notes (Signed)
Patient ID: Christian Munoz, male   DOB: [birth year], 79 y.o.   MRN: [id number]    Allergies  Allergen Reactions  . Chantix [Varenicline]     Makes patient suicidal  . Zoloft [Sertraline Hcl]     Makes patient suicidal    Chief Complaint  Patient presents with  . Acute Visit    swallowed a hearing aid battery on 12/04/13. needs x-ray per poison control  . Immunizations    ?pnuemo vaccine at previous PCP & not sure when last Tdap was.    HPI: Patient is a 79 y.o. male seen in the office today for routine follow up and has swallowed his hearing aid battery (puts it closed to his medication pills and accidentally took it thinking it was part of his medication) 2 days ago, has not really checked his stool; no GI symptoms, no burning in esophagus or pain in abdomen, moving bowels normally. No complaints and otherwise doing well, exercising 1 hr every other day; Has a sweet tooth, weight is not were he wants it to be.   Review of Systems:  Review of Systems  Constitutional: Negative for fever and chills.  HENT: Negative for congestion.   Respiratory: Negative for cough and shortness of breath.   Cardiovascular: Negative for chest pain and leg swelling.  Gastrointestinal: Negative for heartburn, abdominal pain, diarrhea and constipation.  Genitourinary: Positive for frequency (following with urology).  Musculoskeletal: Negative for back pain, falls, joint pain (knee pain has improved), myalgias and neck pain.  Skin: Negative.   Neurological: Negative for dizziness, tingling and weakness.  Psychiatric/Behavioral: Positive for depression. Negative for suicidal ideas. The patient is not nervous/anxious.      Past Medical History  Diagnosis Date  . Syncope and collapse   . Lack of coordination   . Allergic rhinitis due to pollen   . Herpes genitalia   . Vitamin D deficiency   . Other and unspecified hyperlipidemia   . Depressive disorder, not elsewhere classified   . Coronary  atherosclerosis of native coronary artery   . Reflux esophagitis   . Hypertrophy of prostate without urinary obstruction and other lower urinary tract symptoms (LUTS)   . Osteoarthrosis, unspecified whether generalized or localized, unspecified site   . Lumbago   . Other malaise and fatigue   . Urinary frequency   . Lack of coordination   . Coronary atherosclerosis of native coronary artery   . Hypertrophy of prostate without urinary obstruction and other lower urinary tract symptoms (LUTS)   . Osteoarthrosis, unspecified whether generalized or localized, unspecified site    Past Surgical History  Procedure Laterality Date  . Poly vocal cords  1985  . Skin cancer excision  2011  . Cardiac catheterization     Social History:   reports that he has been smoking Cigarettes.  He has been smoking about 0.00 packs per day. He does not have any smokeless tobacco history on file. His alcohol and drug histories are not on file.  Family History  Problem Relation Age of Onset  . Heart disease Father   . Cancer Father   . Cancer Brother   . Cancer Brother     Medications: Patient's Medications  New Prescriptions   No medications on file  Previous Medications   ACYCLOVIR (ZOVIRAX) 800 MG TABLET    Take 800 mg by mouth 2 (two) times daily. Take one tablet once a day to help prevent flare up   ASPIRIN 81 MG TABLET  Take 81 mg by mouth daily. Take one tablet once a day   BUPROPION (WELLBUTRIN SR) 150 MG 12 HR TABLET    APPOINTMENT OVERDUE, 1-2 by mouth every morning   CALCIUM CARBONATE (OS-CAL) 600 MG TABS    Take 600 mg by mouth 2 (two) times daily with a meal. Take one tablet once a day for calcium supplement   DULOXETINE (CYMBALTA) 60 MG CAPSULE    TAKE 2 TABLETS EVERY DAY   FISH OIL-OMEGA-3 FATTY ACIDS 1000 MG CAPSULE    Take 2 g by mouth daily. Take one tablet twice a day   METOPROLOL TARTRATE (LOPRESSOR) 25 MG TABLET    Take 1 tablet (25 mg total) by mouth 2 (two) times daily.    PANTOPRAZOLE (PROTONIX) 40 MG TABLET    APPOINTMENT OVERDUE, 1 by mouth daily   SIMVASTATIN (ZOCOR) 20 MG TABLET    TAKE 1 TABLET BY MOUTH EVERY EVENING FOR CHOLESTEROL   TAMSULOSIN (FLOMAX) 0.4 MG CAPS CAPSULE    TAKE ONE CAPSULE BY MOUTH EVERY DAY  Modified Medications   No medications on file  Discontinued Medications   No medications on file     Physical Exam:  Filed Vitals:   12/06/13 1616  BP: 116/70  Pulse: 64  Temp: 96.6 F (35.9 C)  TempSrc: Oral  Weight: 174 lb 3.2 oz (79.017 kg)    Physical Exam  Constitutional: He is oriented to person, place, and time and well-developed, well-nourished, and in no distress.  HENT:  Head: Normocephalic and atraumatic.  Eyes: Conjunctivae and EOM are normal. Pupils are equal, round, and reactive to light.  Neck: Normal range of motion. Neck supple.  Cardiovascular: Normal rate, regular rhythm and normal heart sounds.   Pulmonary/Chest: Effort normal and breath sounds normal.  Abdominal: Soft. Bowel sounds are normal. He exhibits no distension.  Musculoskeletal: Normal range of motion. He exhibits no edema and no tenderness.  Neurological: He is alert and oriented to person, place, and time.  Skin: Skin is warm and dry. He is not diaphoretic.  Psychiatric: Affect normal.     Labs reviewed: Basic Metabolic Panel:  Recent Labs  01/31/13 1016 04/24/13 0933  NA 139 141  K 4.5 4.0  CL 102 103  CO2 25 22  GLUCOSE 95 102*  BUN 20 24  CREATININE 1.22 1.23  CALCIUM 9.2 9.0  TSH 4.820* 4.130   Liver Function Tests:  Recent Labs  01/31/13 1016 04/24/13 0933  AST 15 17  ALT 19 13  ALKPHOS 83 83  BILITOT 0.3 0.3  PROT 6.2 6.2   No results found for this basename: LIPASE, AMYLASE,  in the last 8760 hours No results found for this basename: AMMONIA,  in the last 8760 hours CBC:  Recent Labs  01/31/13 1016 04/24/13 0933  WBC 9.1 9.0  NEUTROABS 4.0 3.8  HGB 14.1 14.7  HCT 43.1 44.6  MCV 90 90   Lipid  Panel:  Recent Labs  01/31/13 1016 04/24/13 0933  HDL 32* 34*  LDLCALC 68 72  TRIG 199* 126  CHOLHDL 4.4 3.9   TSH:  Recent Labs  01/31/13 1016 04/24/13 0933  TSH 4.820* 4.130   A1C: No components found with this basename: A1C,    Assessment/Plan 1. Reflux esophagitis -no symptoms while on protonix  2. Unspecified essential hypertension -Patient is stable; continue current regimen. Will monitor and make changes as necessary.  3. Swallowed foreign body -no symptoms, has not looked for batteries in stool - DG  Abd 1 View and DG Chest 1 View to make sure the hearing aid batteries have passed  4. Other and unspecified hyperlipidemia -conts on zocor and ASA 81 daily; tolerating medication without side effects, last lipids in July 2014 -LDL at goal  5. Depressive disorder, not elsewhere classified -remains stable; conts on wellbutrin and cymbata  6. Hypertrophy of prostate without urinary obstruction and other lower urinary tract symptoms (LUTS) -conts to follow up with urology, currently on flomax  Follow up in 6 months for EV with blood work the day of physical; will get MMSE

## 2013-12-06 NOTE — Patient Instructions (Signed)
Will get xrays today  Follow up in 6 months for physical with fasting blood work the day of your visit so make appt early

## 2013-12-07 ENCOUNTER — Ambulatory Visit
Admission: RE | Admit: 2013-12-07 | Discharge: 2013-12-07 | Disposition: A | Discharge status: Home / Self Care | Payer: Medicare Other | Source: Ambulatory Visit | Attending: Nurse Practitioner | Admitting: Nurse Practitioner

## 2013-12-07 DIAGNOSIS — T189XXA Foreign body of alimentary tract, part unspecified, initial encounter: Secondary | ICD-10-CM

## 2013-12-13 ENCOUNTER — Ambulatory Visit: Payer: Medicare Other | Admitting: Nurse Practitioner

## 2013-12-15 ENCOUNTER — Telehealth: Payer: Self-pay | Admitting: *Deleted

## 2013-12-15 NOTE — Telephone Encounter (Signed)
Send for repeat abdominal/kub xray to confirm

## 2013-12-15 NOTE — Telephone Encounter (Signed)
Patient called and stated that the battery he swallowed over a week ago has not passed yet and the poison control center told him to call us. Please Advise.

## 2013-12-19 ENCOUNTER — Other Ambulatory Visit: Payer: Self-pay | Admitting: *Deleted

## 2013-12-19 DIAGNOSIS — T189XXA Foreign body of alimentary tract, part unspecified, initial encounter: Secondary | ICD-10-CM

## 2013-12-19 NOTE — Telephone Encounter (Signed)
Patient notified and placed order to have done at [facility]

## 2013-12-20 ENCOUNTER — Ambulatory Visit
Admission: RE | Admit: 2013-12-20 | Discharge: 2013-12-20 | Disposition: A | Discharge status: Home / Self Care | Payer: Medicare Other | Source: Ambulatory Visit | Attending: Nurse Practitioner | Admitting: Nurse Practitioner

## 2013-12-20 DIAGNOSIS — T189XXA Foreign body of alimentary tract, part unspecified, initial encounter: Secondary | ICD-10-CM

## 2013-12-30 ENCOUNTER — Other Ambulatory Visit: Payer: Self-pay | Admitting: Internal Medicine

## 2013-12-30 ENCOUNTER — Other Ambulatory Visit: Payer: Self-pay | Admitting: Nurse Practitioner

## 2014-01-27 ENCOUNTER — Other Ambulatory Visit: Payer: Self-pay | Admitting: Nurse Practitioner

## 2014-03-03 ENCOUNTER — Other Ambulatory Visit: Payer: Self-pay | Admitting: Internal Medicine

## 2014-03-11 ENCOUNTER — Other Ambulatory Visit: Payer: Self-pay | Admitting: Nurse Practitioner

## 2014-04-06 ENCOUNTER — Other Ambulatory Visit: Payer: Self-pay | Admitting: Nurse Practitioner

## 2014-04-19 ENCOUNTER — Other Ambulatory Visit: Payer: Self-pay | Admitting: Nurse Practitioner

## 2014-04-23 ENCOUNTER — Other Ambulatory Visit: Payer: Medicare Other

## 2014-04-23 DIAGNOSIS — E785 Hyperlipidemia, unspecified: Secondary | ICD-10-CM

## 2014-04-23 DIAGNOSIS — I10 Essential (primary) hypertension: Secondary | ICD-10-CM

## 2014-04-23 DIAGNOSIS — T887XXA Unspecified adverse effect of drug or medicament, initial encounter: Secondary | ICD-10-CM

## 2014-04-24 ENCOUNTER — Other Ambulatory Visit: Payer: Self-pay | Admitting: *Deleted

## 2014-04-24 ENCOUNTER — Other Ambulatory Visit: Payer: Self-pay

## 2014-04-24 DIAGNOSIS — R739 Hyperglycemia, unspecified: Secondary | ICD-10-CM

## 2014-04-24 LAB — CBC WITH DIFFERENTIAL/PLATELET
BASOS ABS: 0 10*3/uL (ref 0.0–0.2)
BASOS: 0 %
Eos: 2 %
Eosinophils Absolute: 0.2 10*3/uL (ref 0.0–0.4)
HCT: 44.2 % (ref 37.5–51.0)
HEMOGLOBIN: 14.7 g/dL (ref 12.6–17.7)
Immature Grans (Abs): 0 10*3/uL (ref 0.0–0.1)
Immature Granulocytes: 0 %
LYMPHS: 44 %
Lymphocytes Absolute: 4.7 10*3/uL — ABNORMAL HIGH (ref 0.7–3.1)
MCH: 30.2 pg (ref 26.6–33.0)
MCHC: 33.3 g/dL (ref 31.5–35.7)
MCV: 91 fL (ref 79–97)
MONOCYTES: 7 %
Monocytes Absolute: 0.7 10*3/uL (ref 0.1–0.9)
Neutrophils Absolute: 5.1 10*3/uL (ref 1.4–7.0)
Neutrophils Relative %: 47 %
RBC: 4.87 x10E6/uL (ref 4.14–5.80)
RDW: 14.3 % (ref 12.3–15.4)
WBC: 10.8 10*3/uL (ref 3.4–10.8)

## 2014-04-24 LAB — LIPID PANEL
CHOL/HDL RATIO: 5.3 ratio — AB (ref 0.0–5.0)
Cholesterol, Total: 139 mg/dL (ref 100–199)
HDL: 26 mg/dL — AB (ref >39)
TRIGLYCERIDES: 480 mg/dL — AB (ref 0–149)

## 2014-04-24 LAB — COMPREHENSIVE METABOLIC PANEL
A/G RATIO: 2 (ref 1.1–2.5)
ALT: 13 IU/L (ref 0–44)
AST: 15 IU/L (ref 0–40)
Albumin: 4.3 g/dL (ref 3.5–4.8)
Alkaline Phosphatase: 96 IU/L (ref 39–117)
BUN/Creatinine Ratio: 12 (ref 10–22)
BUN: 16 mg/dL (ref 8–27)
CALCIUM: 9.4 mg/dL (ref 8.6–10.2)
CHLORIDE: 103 mmol/L (ref 97–108)
CO2: 22 mmol/L (ref 18–29)
Creatinine, Ser: 1.38 mg/dL — ABNORMAL HIGH (ref 0.76–1.27)
GFR calc Af Amer: 57 mL/min/{1.73_m2} — ABNORMAL LOW (ref >59)
GFR calc non Af Amer: 49 mL/min/{1.73_m2} — ABNORMAL LOW (ref >59)
GLUCOSE: 104 mg/dL — AB (ref 65–99)
Globulin, Total: 2.1 g/dL (ref 1.5–4.5)
Potassium: 4 mmol/L (ref 3.5–5.2)
SODIUM: 142 mmol/L (ref 134–144)
TOTAL PROTEIN: 6.4 g/dL (ref 6.0–8.5)
Total Bilirubin: <0.2 mg/dL (ref 0.0–1.2)

## 2014-04-24 LAB — TSH: TSH: 4.52 u[IU]/mL — AB (ref 0.450–4.500)

## 2014-04-25 ENCOUNTER — Other Ambulatory Visit: Payer: Medicare Other

## 2014-04-25 ENCOUNTER — Encounter: Payer: Medicare Other | Admitting: Nurse Practitioner

## 2014-04-25 DIAGNOSIS — R739 Hyperglycemia, unspecified: Secondary | ICD-10-CM

## 2014-04-26 ENCOUNTER — Telehealth: Payer: Self-pay | Admitting: *Deleted

## 2014-04-26 LAB — HEMOGLOBIN A1C
ESTIMATED AVERAGE GLUCOSE: 120 mg/dL
HEMOGLOBIN A1C: 5.8 % — AB (ref 4.8–5.6)

## 2014-04-26 MED ORDER — SIMVASTATIN 40 MG PO TABS: 40.0000 mg | ORAL_TABLET | Freq: Every day | ORAL | Status: DC

## 2014-04-26 NOTE — Telephone Encounter (Signed)
Pt notified VIA phone regarding results and medication changes.  Per Janett Billow: increase Simvastatin form 20 mg to 40 mg and diet information mailed to pt's home address.  Also imformed of NL A1c.

## 2014-05-16 ENCOUNTER — Encounter: Payer: Medicare Other | Admitting: Nurse Practitioner

## 2014-05-17 ENCOUNTER — Encounter: Payer: Medicare Other | Admitting: Nurse Practitioner

## 2014-06-20 ENCOUNTER — Other Ambulatory Visit: Payer: Self-pay | Admitting: Nurse Practitioner

## 2014-07-05 ENCOUNTER — Encounter: Payer: Medicare Other | Admitting: Nurse Practitioner

## 2014-07-06 ENCOUNTER — Other Ambulatory Visit: Payer: Self-pay | Admitting: Internal Medicine

## 2014-07-18 ENCOUNTER — Other Ambulatory Visit: Payer: Self-pay | Admitting: Nurse Practitioner

## 2014-07-26 ENCOUNTER — Encounter: Payer: Medicare Other | Admitting: Nurse Practitioner

## 2014-08-09 ENCOUNTER — Encounter: Payer: Self-pay | Admitting: *Deleted

## 2014-08-09 ENCOUNTER — Encounter: Payer: Self-pay | Admitting: Nurse Practitioner

## 2014-08-09 ENCOUNTER — Ambulatory Visit (INDEPENDENT_AMBULATORY_CARE_PROVIDER_SITE_OTHER): Payer: Medicare Other | Admitting: Nurse Practitioner

## 2014-08-09 VITALS — BP 130/82 | HR 70 | Temp 97.7°F | Resp 18 | Ht 69.0 in | Wt 173.4 lb

## 2014-08-09 DIAGNOSIS — Z0000 Encounter for general adult medical examination without abnormal findings: Secondary | ICD-10-CM

## 2014-08-09 DIAGNOSIS — E785 Hyperlipidemia, unspecified: Secondary | ICD-10-CM

## 2014-08-09 NOTE — Progress Notes (Signed)
Clock =failed

## 2014-08-09 NOTE — Progress Notes (Signed)
Patient ID: Christian Munoz, male   DOB: [birth year], 79 y.o.   MRN: [id number]    PCP: [name], NP  Allergies  Allergen Reactions  . Chantix [Varenicline]     Makes patient suicidal  . Zoloft [Sertraline Hcl]     Makes patient suicidal    Chief Complaint  Patient presents with  . Annual Exam     HPI: Patient is a 79 y.o. male seen in the office today for wellness visit.  Labs reviewed with pt Would not like colonoscopy but willing to do colo-guard Does not want flu shot.  Unsure if he has had pneumonia vaccine- would like to get vaccine.  Urologist checking prostate Current smoker- would not like to have screening CT Has cut back - has had screening for AAA -follows with dermatologist yearly to have skin check, hx of skin cancer -Eye Doctor- every 6 months due to Macular Degeneration -Dentist- every 6 months  -exercising 3- 4 days a week for 1 hour Review of Systems:  Review of Systems  Constitutional: Negative for fever, activity change, appetite change and unexpected weight change.  HENT: Positive for hearing loss. Negative for congestion and sinus pressure.   Eyes: Negative for visual disturbance.  Respiratory: Negative for cough and shortness of breath.   Cardiovascular: Negative for chest pain, palpitations and leg swelling.  Gastrointestinal: Negative for abdominal pain, constipation and abdominal distention.  Genitourinary: Negative for dysuria.  Musculoskeletal: Negative for arthralgias, back pain and neck pain.  Neurological: Negative for dizziness, tremors and syncope.  Hematological: Negative for adenopathy.  Psychiatric/Behavioral: Negative for behavioral problems and agitation.    Past Medical History  Diagnosis Date  . Syncope and collapse   . Lack of coordination   . Allergic rhinitis due to pollen   . Herpes genitalia   . Vitamin D deficiency   . Other and unspecified hyperlipidemia   . Depressive disorder, not elsewhere classified    . Coronary atherosclerosis of native coronary artery   . Reflux esophagitis   . Hypertrophy of prostate without urinary obstruction and other lower urinary tract symptoms (LUTS)   . Osteoarthrosis, unspecified whether generalized or localized, unspecified site   . Lumbago   . Other malaise and fatigue   . Urinary frequency   . Lack of coordination   . Coronary atherosclerosis of native coronary artery   . Hypertrophy of prostate without urinary obstruction and other lower urinary tract symptoms (LUTS)   . Osteoarthrosis, unspecified whether generalized or localized, unspecified site    Past Surgical History  Procedure Laterality Date  . Poly vocal cords  1985  . Skin cancer excision  2011  . Cardiac catheterization     Social History:   reports that he has been smoking Cigarettes.  He has been smoking about 0.00 packs per day. He does not have any smokeless tobacco history on file. His alcohol and drug histories are not on file.  Family History  Problem Relation Age of Onset  . Heart disease Father   . Cancer Father   . Cancer Brother   . Cancer Brother     Medications: Patient's Medications  New Prescriptions   No medications on file  Previous Medications   ACYCLOVIR (ZOVIRAX) 800 MG TABLET    Take 800 mg by mouth 2 (two) times daily. Take one tablet once a day to help prevent flare up   ASPIRIN 81 MG TABLET    Take 81 mg by mouth daily. Take one tablet once  a day   BUPROPION (WELLBUTRIN SR) 150 MG 12 HR TABLET    TAKE 1-2 TABLETS BY MOUTH EVERY MORNING   CALCIUM CARBONATE (OS-CAL) 600 MG TABS    Take 600 mg by mouth 2 (two) times daily with a meal. Take one tablet once a day for calcium supplement   DULOXETINE (CYMBALTA) 60 MG CAPSULE    TAKE 2 CAPSULES EVERY DAY   FISH OIL-OMEGA-3 FATTY ACIDS 1000 MG CAPSULE    Take 2 g by mouth daily. Take one tablet twice a day   METOPROLOL TARTRATE (LOPRESSOR) 25 MG TABLET    TAKE 1 TABLET TWICE A DAY   PANTOPRAZOLE (PROTONIX) 40 MG  TABLET    TAKE 1 TABLET BY MOUTH DAILY   SIMVASTATIN (ZOCOR) 40 MG TABLET    TAKE 1 TABLET (40 MG TOTAL) BY MOUTH DAILY.   TAMSULOSIN (FLOMAX) 0.4 MG CAPS CAPSULE    TAKE ONE CAPSULE BY MOUTH EVERY DAY  Modified Medications   No medications on file  Discontinued Medications   No medications on file     Physical Exam:  Filed Vitals:   08/09/14 1131 08/09/14 1138  BP: 142/90 130/82  Pulse: 70   Temp: 97.7 F (36.5 C)   TempSrc: Oral   Resp: 18   Height: 5\' 9"  (1.753 m)   Weight: 173 lb 6.4 oz (78.654 kg)   SpO2: 91%     Physical Exam  Constitutional: He is oriented to person, place, and time. He appears well-developed and well-nourished. No distress.  HENT:  Head: Normocephalic and atraumatic.  Mouth/Throat: Oropharynx is clear and moist.  Eyes: Conjunctivae are normal. Pupils are equal, round, and reactive to light.  Neck: Normal range of motion. Neck supple.  Cardiovascular: Normal rate and regular rhythm.   Pulmonary/Chest: Effort normal and breath sounds normal.  Abdominal: Soft. Bowel sounds are normal.  Musculoskeletal: Normal range of motion. He exhibits no edema and no tenderness.  Lymphadenopathy:    He has no cervical adenopathy.  Neurological: He is alert and oriented to person, place, and time.  Skin: Skin is warm and dry. No rash noted. He is not diaphoretic. No pallor.  Psychiatric: He has a normal mood and affect. His behavior is normal.    Labs reviewed: Basic Metabolic Panel:  Recent Labs  04/23/14 0829  NA 142  K 4.0  CL 103  CO2 22  GLUCOSE 104*  BUN 16  CREATININE 1.38*  CALCIUM 9.4  TSH 4.520*   Liver Function Tests:  Recent Labs  04/23/14 0829  AST 15  ALT 13  ALKPHOS 96  BILITOT <0.2  PROT 6.4   No results found for this basename: LIPASE, AMYLASE,  in the last 8760 hours No results found for this basename: AMMONIA,  in the last 8760 hours CBC:  Recent Labs  04/23/14 0829  WBC 10.8  NEUTROABS 5.1  HGB 14.7  HCT 44.2    MCV 91   Lipid Panel:  Recent Labs  04/23/14 0829  HDL 26*  LDLCALC Comment  TRIG 480*  CHOLHDL 5.3*   TSH:  Recent Labs  04/23/14 0829  TSH 4.520*   A1C: Lab Results  Component Value Date   HGBA1C 5.8* 04/25/2014     Assessment/Plan  1. Wellness Exam PREVENTIVE COUNSELING:  The patient was counseled regarding the appropriate use of alcohol, regular self-examination of the breasts on a monthly basis, prevention of dental and periodontal disease, diet, regular sustained exercise for at least 30 minutes 5 times  per week, testicular self-examination on a monthly basis,smoking cessation, the proper use of sunscreen and protective clothing, tobacco use,  and recommended schedule for GI hemoccult testing, colonoscopy, cholesterol, thyroid and diabetes screening. -pt willing to do colo-guard, will send in information -declines flu -educated regarding Screening CT, pt declines  -continues following with urology, dermatology, opthalmology, dentist for surveillance   2. Hyperlipidemia -conts zocor, fish oil - cont lifestyle modification  - Comprehensive metabolic panel - Lipid panel  Follow up in 4 months with Dr Bubba Camp Will need Pneumonia vaccine

## 2014-08-09 NOTE — Patient Instructions (Signed)
Will get blood work today  Colo-guard information will be sent and they will contact you   Follow up in 4 months with Dr Bubba Camp

## 2014-08-10 LAB — COMPREHENSIVE METABOLIC PANEL
ALK PHOS: 80 IU/L (ref 39–117)
ALT: 16 IU/L (ref 0–44)
AST: 18 IU/L (ref 0–40)
Albumin/Globulin Ratio: 1.8 (ref 1.1–2.5)
Albumin: 4.2 g/dL (ref 3.5–4.8)
BUN / CREAT RATIO: 14 (ref 10–22)
BUN: 20 mg/dL (ref 8–27)
CHLORIDE: 100 mmol/L (ref 97–108)
CO2: 25 mmol/L (ref 18–29)
Calcium: 9.1 mg/dL (ref 8.6–10.2)
Creatinine, Ser: 1.48 mg/dL — ABNORMAL HIGH (ref 0.76–1.27)
GFR calc Af Amer: 52 mL/min/{1.73_m2} — ABNORMAL LOW (ref >59)
GFR calc non Af Amer: 45 mL/min/{1.73_m2} — ABNORMAL LOW (ref >59)
Globulin, Total: 2.3 g/dL (ref 1.5–4.5)
Glucose: 107 mg/dL — ABNORMAL HIGH (ref 65–99)
Potassium: 4.7 mmol/L (ref 3.5–5.2)
SODIUM: 139 mmol/L (ref 134–144)
Total Bilirubin: 0.3 mg/dL (ref 0.0–1.2)
Total Protein: 6.5 g/dL (ref 6.0–8.5)

## 2014-08-10 LAB — LIPID PANEL
CHOLESTEROL TOTAL: 136 mg/dL (ref 100–199)
Chol/HDL Ratio: 3.8 ratio units (ref 0.0–5.0)
HDL: 36 mg/dL — ABNORMAL LOW (ref >39)
LDL Calculated: 72 mg/dL (ref 0–99)
Triglycerides: 142 mg/dL (ref 0–149)
VLDL Cholesterol Cal: 28 mg/dL (ref 5–40)

## 2014-08-21 ENCOUNTER — Other Ambulatory Visit: Payer: Self-pay | Admitting: Nurse Practitioner

## 2014-09-07 LAB — COLOGUARD

## 2014-09-13 ENCOUNTER — Other Ambulatory Visit: Payer: Self-pay | Admitting: Nurse Practitioner

## 2014-09-24 ENCOUNTER — Encounter: Payer: Self-pay | Admitting: Nurse Practitioner

## 2014-09-28 ENCOUNTER — Ambulatory Visit (INDEPENDENT_AMBULATORY_CARE_PROVIDER_SITE_OTHER): Payer: No Typology Code available for payment source | Admitting: Licensed Clinical Social Worker

## 2014-09-28 DIAGNOSIS — F331 Major depressive disorder, recurrent, moderate: Secondary | ICD-10-CM | POA: Diagnosis not present

## 2014-10-10 ENCOUNTER — Other Ambulatory Visit: Payer: Self-pay | Admitting: Internal Medicine

## 2014-11-21 ENCOUNTER — Other Ambulatory Visit: Payer: Self-pay | Admitting: Internal Medicine

## 2014-11-28 ENCOUNTER — Other Ambulatory Visit: Payer: Self-pay | Admitting: Internal Medicine

## 2014-11-30 ENCOUNTER — Other Ambulatory Visit: Payer: Self-pay

## 2014-12-03 ENCOUNTER — Other Ambulatory Visit: Payer: Medicare Other

## 2014-12-03 DIAGNOSIS — N289 Disorder of kidney and ureter, unspecified: Secondary | ICD-10-CM

## 2014-12-04 ENCOUNTER — Ambulatory Visit (INDEPENDENT_AMBULATORY_CARE_PROVIDER_SITE_OTHER): Payer: Medicare Other | Admitting: Internal Medicine

## 2014-12-04 ENCOUNTER — Encounter: Payer: Self-pay | Admitting: Internal Medicine

## 2014-12-04 VITALS — BP 112/70 | HR 62 | Temp 97.8°F | Resp 20 | Ht 69.0 in | Wt 178.8 lb

## 2014-12-04 DIAGNOSIS — R7309 Other abnormal glucose: Secondary | ICD-10-CM

## 2014-12-04 DIAGNOSIS — F329 Major depressive disorder, single episode, unspecified: Secondary | ICD-10-CM | POA: Diagnosis not present

## 2014-12-04 DIAGNOSIS — K2100 Gastro-esophageal reflux disease with esophagitis, without bleeding: Secondary | ICD-10-CM

## 2014-12-04 DIAGNOSIS — R7303 Prediabetes: Secondary | ICD-10-CM

## 2014-12-04 DIAGNOSIS — I10 Essential (primary) hypertension: Secondary | ICD-10-CM | POA: Diagnosis not present

## 2014-12-04 DIAGNOSIS — N138 Other obstructive and reflux uropathy: Secondary | ICD-10-CM | POA: Insufficient documentation

## 2014-12-04 DIAGNOSIS — N401 Enlarged prostate with lower urinary tract symptoms: Secondary | ICD-10-CM

## 2014-12-04 DIAGNOSIS — E785 Hyperlipidemia, unspecified: Secondary | ICD-10-CM

## 2014-12-04 DIAGNOSIS — Z23 Encounter for immunization: Secondary | ICD-10-CM

## 2014-12-04 DIAGNOSIS — F32A Depression, unspecified: Secondary | ICD-10-CM | POA: Insufficient documentation

## 2014-12-04 LAB — BASIC METABOLIC PANEL
BUN/Creatinine Ratio: 14 (ref 10–22)
BUN: 19 mg/dL (ref 8–27)
CHLORIDE: 102 mmol/L (ref 97–108)
CO2: 22 mmol/L (ref 18–29)
Calcium: 9.3 mg/dL (ref 8.6–10.2)
Creatinine, Ser: 1.38 mg/dL — ABNORMAL HIGH (ref 0.76–1.27)
GFR calc Af Amer: 57 mL/min/{1.73_m2} — ABNORMAL LOW (ref >59)
GFR calc non Af Amer: 49 mL/min/{1.73_m2} — ABNORMAL LOW (ref >59)
GLUCOSE: 106 mg/dL — AB (ref 65–99)
POTASSIUM: 4.2 mmol/L (ref 3.5–5.2)
Sodium: 141 mmol/L (ref 134–144)

## 2014-12-04 MED ORDER — PANTOPRAZOLE SODIUM 20 MG PO TBEC: 20.0000 mg | DELAYED_RELEASE_TABLET | Freq: Every day | ORAL | Status: DC

## 2014-12-04 MED ORDER — SIMVASTATIN 20 MG PO TABS: 40.0000 mg | ORAL_TABLET | Freq: Every day | ORAL | Status: DC

## 2014-12-04 NOTE — Progress Notes (Signed)
Patient ID: Christian Munoz, male   DOB: Oct 15, 1934, 80 y.o.   MRN: [id number]    Chief Complaint  Patient presents with  . Medical Management of Chronic Issues   Allergies  Allergen Reactions  . Chantix [Varenicline]     Makes patient suicidal  . Zoloft [Sertraline Hcl]     Makes patient suicidal   HPI 80 y/o male patient is here for routine follow up visit. His reflux symptom is under control with protonix. Has been on wellbutrin and duloxetine for depression for 3 years without unchanged doses. He has been on several mood medication before- does not remember the names but remembers zoloft in particular. He feels his mood medication is not helping as it should and would like to see a psychiatrist. Taking his cholesterol medication. Reviewed recent labs with patient.  Review of Systems  Constitutional: Negative for fever, appetite change and unexpected weight change.  HENT: Positive for hearing loss. Negative for congestion and sinus pressure.   Eyes: Negative for visual disturbance.  Respiratory: Negative for cough and shortness of breath.   Cardiovascular: Negative for chest pain, palpitations and leg swelling.  Gastrointestinal: Negative for abdominal pain, constipation and abdominal distention.  Genitourinary: Negative for dysuria.  Musculoskeletal: Negative for arthralgias, back pain and neck pain.  Neurological: Negative for dizziness, tremors and syncope.  Hematological: Negative for adenopathy.  Psychiatric/Behavioral: Negative for agitation.   Past Medical History  Diagnosis Date  . Syncope and collapse   . Lack of coordination   . Allergic rhinitis due to pollen   . Herpes genitalia   . Vitamin D deficiency   . Other and unspecified hyperlipidemia   . Depressive disorder, not elsewhere classified   . Coronary atherosclerosis of native coronary artery   . Reflux esophagitis   . Hypertrophy of prostate without urinary obstruction and other lower urinary tract  symptoms (LUTS)   . Osteoarthrosis, unspecified whether generalized or localized, unspecified site   . Lumbago   . Other malaise and fatigue   . Urinary frequency   . Lack of coordination   . Coronary atherosclerosis of native coronary artery   . Hypertrophy of prostate without urinary obstruction and other lower urinary tract symptoms (LUTS)   . Osteoarthrosis, unspecified whether generalized or localized, unspecified site    Current Outpatient Prescriptions on File Prior to Visit  Medication Sig Dispense Refill  . acyclovir (ZOVIRAX) 800 MG tablet Take 800 mg by mouth 2 (two) times daily. Take one tablet once a day to help prevent flare up    . aspirin 81 MG tablet Take 81 mg by mouth daily. Take one tablet once a day    . buPROPion (WELLBUTRIN SR) 150 MG 12 hr tablet TAKE 1- 2 TABLETS BY MOUTH EVERY MORNING 60 tablet 0  . calcium carbonate (OS-CAL) 600 MG TABS Take 600 mg by mouth 2 (two) times daily with a meal. Take one tablet once a day for calcium supplement    . DULoxetine (CYMBALTA) 60 MG capsule TAKE 2 CAPSULES EVERY DAY 60 capsule 0  . fish oil-omega-3 fatty acids 1000 MG capsule Take 2 g by mouth daily. Take one tablet twice a day    . metoprolol tartrate (LOPRESSOR) 25 MG tablet TAKE 1 TABLET TWICE A DAY 180 tablet 3  . tamsulosin (FLOMAX) 0.4 MG CAPS capsule TAKE ONE CAPSULE BY MOUTH EVERY DAY 90 capsule 2   No current facility-administered medications on file prior to visit.    Physical exam BP 112/70 mmHg  Pulse 62  Temp(Src) 97.8 F (36.6 C) (Oral)  Resp 20  Ht 5\' 9"  (1.753 m)  Wt 178 lb 12.8 oz (81.103 kg)  BMI 26.39 kg/m2  SpO2 97%  Wt Readings from Last 3 Encounters:  12/04/14 178 lb 12.8 oz (81.103 kg)  08/09/14 173 lb 6.4 oz (78.654 kg)  12/06/13 174 lb 3.2 oz (79.017 kg)   Constitutional: He is oriented to person, place, and time. He appears well-developed and well-nourished. No distress.  HENT:   Head: Normocephalic and atraumatic.   Mouth/Throat:  Oropharynx is clear and moist.  Eyes: Conjunctivae are normal. Pupils are equal, round, and reactive to light.  Neck: Normal range of motion. Neck supple.  Cardiovascular: Normal rate and regular rhythm.   Pulmonary/Chest: Effort normal and breath sounds normal.  Abdominal: Soft. Bowel sounds are normal.  Musculoskeletal: Normal range of motion. He exhibits no edema and no tenderness.  Lymphadenopathy:    He has no cervical adenopathy.  Neurological: He is alert and oriented to person, place, and time.  Skin: Skin is warm and dry. No rash noted. He is not diaphoretic. No pallor.  Psychiatric: calm and conversational this visit, depression screening performed today   Labs CBC Latest Ref Rng 04/23/2014 04/24/2013 01/31/2013  WBC 3.4 - 10.8 x10E3/uL 10.8 9.0 9.1  Hemoglobin 12.6 - 17.7 g/dL 14.7 14.7 14.1  Hematocrit 37.5 - 51.0 % 44.2 44.6 43.1   CMP Latest Ref Rng 12/03/2014 08/09/2014 04/23/2014  Glucose 65 - 99 mg/dL 106(H) 107(H) 104(H)  BUN 8 - 27 mg/dL 19 20 16   Creatinine 0.76 - 1.27 mg/dL 1.38(H) 1.48(H) 1.38(H)  Sodium 134 - 144 mmol/L 141 139 142  Potassium 3.5 - 5.2 mmol/L 4.2 4.7 4.0  Chloride 97 - 108 mmol/L 102 100 103  CO2 18 - 29 mmol/L 22 25 22   Calcium 8.6 - 10.2 mg/dL 9.3 9.1 9.4  Total Protein 6.0 - 8.5 g/dL - 6.5 6.4  Albumin 3.5 - 4.8 g/dL - 4.2 4.3  Total Bilirubin 0.0 - 1.2 mg/dL - 0.3 <0.2  Alkaline Phos 39 - 117 IU/L - 80 96  AST 0 - 40 IU/L - 18 15  ALT 0 - 44 IU/L - 16 13   Lab Results  Component Value Date   HGBA1C 5.8* 04/25/2014   Lipid Panel     Component Value Date/Time   TRIG 142 08/09/2014 1227   HDL 36* 08/09/2014 1227   CHOLHDL 3.8 08/09/2014 1227   LDLCALC 72 08/09/2014 1227    Assessment/plan  1. Prediabetes Given his age, hx of HLD and HTN and prediabetes in past, recheck a1c to assess for diabetes - Hemoglobin A1c  2. Essential hypertension Stable readings, continue metoprolol with asa and statin for now. Monitor bp readings at  home  3. Reflux esophagitis Symptoms have been under control. Decrease pantoprazole to 20 mg daily and monitor his symptoms. If remains under control, will consider taking him off it to avoid long term use and side effects  4. Hyperlipidemia improved with ldl at goal. Decrease zocor to 20 mg daily for now. Dietary counselling provided  5. BPH (benign prostatic hypertrophy) with urinary obstruction Stable, continue current regimen flomax. Follows with urology  6. Depression PHQ-9- score of 12 suggestive of moderate depression. No suicidal toughts at present. Has been tried on several psych meds before without desired result as per patient. Continue wellbutrin and duloxetine for now. - Ambulatory referral to Psychiatry

## 2014-12-04 NOTE — Patient Instructions (Signed)
Please make your psychiatry appointment

## 2014-12-05 LAB — HEMOGLOBIN A1C
Est. average glucose Bld gHb Est-mCnc: 123 mg/dL
Hgb A1c MFr Bld: 5.9 % — ABNORMAL HIGH (ref 4.8–5.6)

## 2014-12-12 ENCOUNTER — Other Ambulatory Visit: Payer: Self-pay | Admitting: Internal Medicine

## 2014-12-25 ENCOUNTER — Other Ambulatory Visit: Payer: Self-pay | Admitting: *Deleted

## 2014-12-25 MED ORDER — TAMSULOSIN HCL 0.4 MG PO CAPS: 0.4000 mg | ORAL_CAPSULE | Freq: Every day | ORAL | Status: DC

## 2014-12-25 NOTE — Telephone Encounter (Signed)
[facility] called and requested refill

## 2015-01-02 ENCOUNTER — Other Ambulatory Visit: Payer: Self-pay | Admitting: Internal Medicine

## 2015-01-09 ENCOUNTER — Other Ambulatory Visit: Payer: Self-pay | Admitting: Internal Medicine

## 2015-03-29 ENCOUNTER — Other Ambulatory Visit: Payer: Self-pay | Admitting: Nurse Practitioner

## 2015-04-04 ENCOUNTER — Encounter: Payer: Self-pay | Admitting: Nurse Practitioner

## 2015-04-04 ENCOUNTER — Ambulatory Visit (INDEPENDENT_AMBULATORY_CARE_PROVIDER_SITE_OTHER): Payer: Medicare Other | Admitting: Nurse Practitioner

## 2015-04-04 VITALS — BP 112/70 | HR 62 | Temp 97.9°F | Resp 18 | Ht 70.0 in | Wt 176.0 lb

## 2015-04-04 DIAGNOSIS — K2100 Gastro-esophageal reflux disease with esophagitis, without bleeding: Secondary | ICD-10-CM

## 2015-04-04 DIAGNOSIS — I10 Essential (primary) hypertension: Secondary | ICD-10-CM | POA: Diagnosis not present

## 2015-04-04 DIAGNOSIS — R7303 Prediabetes: Secondary | ICD-10-CM

## 2015-04-04 DIAGNOSIS — E785 Hyperlipidemia, unspecified: Secondary | ICD-10-CM

## 2015-04-04 DIAGNOSIS — Z0000 Encounter for general adult medical examination without abnormal findings: Secondary | ICD-10-CM

## 2015-04-04 DIAGNOSIS — R7309 Other abnormal glucose: Secondary | ICD-10-CM | POA: Diagnosis not present

## 2015-04-04 MED ORDER — PANTOPRAZOLE SODIUM 20 MG PO TBEC: 20.0000 mg | DELAYED_RELEASE_TABLET | Freq: Every day | ORAL | Status: DC

## 2015-04-04 MED ORDER — SIMVASTATIN 20 MG PO TABS: 20.0000 mg | ORAL_TABLET | Freq: Every day | ORAL | Status: DC

## 2015-04-04 NOTE — Progress Notes (Signed)
Patient ID: Christian Munoz, male   DOB: July 16, 1935, 80 y.o.   MRN: [id number]    PCP: [name], NP  Allergies  Allergen Reactions  . Chantix [Varenicline]     Makes patient suicidal  . Zoloft [Sertraline Hcl]     Makes patient suicidal    Chief Complaint  Patient presents with  . Medical Management of Chronic Issues     HPI: Patient is a 80 y.o. male seen in the office today for routine follow up. Doing well. No complaints  Has still not see psychiatry, spends most of his time laying on the sofa, no SI or HI  Went to [place] with friends who went to a conference- had fun, was a little disappointed but enjoyed it over all.   Advanced Directive information Does patient have an advance directive?: Yes Review of Systems:  Review of Systems  Constitutional: Negative for fever, activity change, appetite change and unexpected weight change.  HENT: Positive for hearing loss. Negative for congestion and sinus pressure.   Eyes: Negative for visual disturbance.  Respiratory: Negative for cough and shortness of breath.   Cardiovascular: Negative for chest pain, palpitations and leg swelling.  Gastrointestinal: Negative for abdominal pain, constipation and abdominal distention.  Genitourinary: Negative for dysuria and difficulty urinating.       Following with urology due to BPH  Musculoskeletal: Negative for back pain, arthralgias and neck pain.  Neurological: Negative for dizziness, tremors and syncope.  Hematological: Negative for adenopathy.  Psychiatric/Behavioral: Negative for behavioral problems and agitation.    Past Medical History  Diagnosis Date  . Syncope and collapse   . Lack of coordination   . Allergic rhinitis due to pollen   . Herpes genitalia   . Vitamin D deficiency   . Other and unspecified hyperlipidemia   . Depressive disorder, not elsewhere classified   . Coronary atherosclerosis of native coronary artery   . Reflux esophagitis   .  Hypertrophy of prostate without urinary obstruction and other lower urinary tract symptoms (LUTS)   . Osteoarthrosis, unspecified whether generalized or localized, unspecified site   . Lumbago   . Other malaise and fatigue   . Urinary frequency   . Lack of coordination   . Coronary atherosclerosis of native coronary artery   . Hypertrophy of prostate without urinary obstruction and other lower urinary tract symptoms (LUTS)   . Osteoarthrosis, unspecified whether generalized or localized, unspecified site    Past Surgical History  Procedure Laterality Date  . Poly vocal cords  1985  . Skin cancer excision  2011  . Cardiac catheterization     Social History:   reports that he has been smoking Cigarettes.  He does not have any smokeless tobacco history on file. His alcohol and drug histories are not on file.  Family History  Problem Relation Age of Onset  . Heart disease Father   . Cancer Father   . Cancer Brother   . Cancer Brother     Medications: Patient's Medications  New Prescriptions   No medications on file  Previous Medications   ACYCLOVIR (ZOVIRAX) 800 MG TABLET    Take 800 mg by mouth 2 (two) times daily. Take one tablet once a day to help prevent flare up   ASPIRIN 81 MG TABLET    Take 81 mg by mouth daily. Take one tablet once a day   BUPROPION (WELLBUTRIN SR) 150 MG 12 HR TABLET    TAKE 1 TO 2 TABLET BY MOUTH  EVERY MORNING   CALCIUM CARBONATE (OS-CAL) 600 MG TABS    Take 600 mg by mouth 2 (two) times daily with a meal. Take one tablet once a day for calcium supplement   DULOXETINE (CYMBALTA) 60 MG CAPSULE    TAKE 2 CAPSULES BY MOUTH EVERY DAY   FISH OIL-OMEGA-3 FATTY ACIDS 1000 MG CAPSULE    Take 2 g by mouth daily. Take one tablet twice a day   GLUCOSAMINE-CHONDROIT-VIT C-MN (GLUCOSAMINE 1500 COMPLEX) CAPS    Take 2 capsules by mouth daily.   METOPROLOL TARTRATE (LOPRESSOR) 25 MG TABLET    TAKE 1 TABLET BY MOUTH TWICE DAILY   MULTIPLE VITAMINS-MINERALS (PRESERVISION  AREDS 2) CAPS    Take 2 capsules by mouth daily.   PANTOPRAZOLE (PROTONIX) 20 MG TABLET    Take 1 tablet (20 mg total) by mouth daily.   SIMVASTATIN (ZOCOR) 20 MG TABLET    Take 2 tablets (40 mg total) by mouth daily.   TAMSULOSIN (FLOMAX) 0.4 MG CAPS CAPSULE    Take 1 capsule (0.4 mg total) by mouth daily.  Modified Medications   No medications on file  Discontinued Medications   No medications on file     Physical Exam:  Filed Vitals:   04/04/15 1418  BP: 112/70  Pulse: 62  Temp: 97.9 F (36.6 C)  TempSrc: Oral  Resp: 18  Height: '5\' 10"'$  (1.778 m)  Weight: 176 lb (79.833 kg)  SpO2: 91%    Physical Exam  Constitutional: He is oriented to person, place, and time. He appears well-developed and well-nourished. No distress.  HENT:  Head: Normocephalic and atraumatic.  Right Ear: External ear normal.  Left Ear: External ear normal.  Nose: Nose normal.  Mouth/Throat: Oropharynx is clear and moist. No oropharyngeal exudate.  Eyes: Conjunctivae are normal. Pupils are equal, round, and reactive to light.  Neck: Normal range of motion. Neck supple.  Cardiovascular: Normal rate, regular rhythm and normal heart sounds.   Pulmonary/Chest: Effort normal and breath sounds normal.  Abdominal: Soft. Bowel sounds are normal.  Musculoskeletal: He exhibits no edema or tenderness.  Neurological: He is alert and oriented to person, place, and time.  Skin: Skin is warm and dry. He is not diaphoretic.  Psychiatric: He has a normal mood and affect. His behavior is normal.    Labs reviewed: Basic Metabolic Panel:  Recent Labs  04/23/14 0829 08/09/14 1227 12/03/14 1207  NA 142 139 141  K 4.0 4.7 4.2  CL 103 100 102  CO2 '22 25 22  '$ GLUCOSE 104* 107* 106*  BUN '16 20 19  '$ CREATININE 1.38* 1.48* 1.38*  CALCIUM 9.4 9.1 9.3  TSH 4.520*  --   --    Liver Function Tests:  Recent Labs  04/23/14 0829 08/09/14 1227  AST 15 18  ALT 13 16  ALKPHOS 96 80  BILITOT <0.2 0.3  PROT 6.4 6.5     No results for input(s): LIPASE, AMYLASE in the last 8760 hours. No results for input(s): AMMONIA in the last 8760 hours. CBC:  Recent Labs  04/23/14 0829  WBC 10.8  NEUTROABS 5.1  HGB 14.7  HCT 44.2  MCV 91   Lipid Panel:  Recent Labs  04/23/14 0829 08/09/14 1227  CHOL 139 136  HDL 26* 36*  LDLCALC Comment 72  TRIG 480* 142  CHOLHDL 5.3* 3.8   TSH:  Recent Labs  04/23/14 0829  TSH 4.520*   A1C: Lab Results  Component Value Date   HGBA1C 5.9*  12/04/2014     Assessment/Plan 1. Prediabetes -cont lifestyle modifications, to increase activity   2. Essential hypertension Blood pressure at goal, cont current regimen  - CBC with Differential  3. Reflux esophagitis -stable on current dose - pantoprazole (PROTONIX) 20 MG tablet; Take 1 tablet (20 mg total) by mouth daily.  Dispense: 90 tablet; Refill: 3  4. Hyperlipidemia - simvastatin (ZOCOR) 20 MG tablet; Take 1 tablet (20 mg total) by mouth daily.  Dispense: 90 tablet; Refill: 3 - Comprehensive metabolic panel  5. Preventative health care - DNR (Do Not Resuscitate)  6. Depression Unchanged, conts with psychotherapist but needs to see psychiatrist, reports he plans to make appt -no SI or HI  Jessica K. Harle Battiest  [facility] [phone] 8 am - 5 pm) [phone] (after hours)

## 2015-04-04 NOTE — Patient Instructions (Signed)
Follow up with psychiatrist-- make the call to go  Cut back on smoking- set a goal Drink more water- 64 oz a day is recommended   Eat more than 1 meal at day  Diabetes Mellitus and Food It is important for you to manage your blood sugar (glucose) level. Your blood glucose level can be greatly affected by what you eat. Eating healthier foods in the appropriate amounts throughout the day at about the same time each day will help you control your blood glucose level. It can also help slow or prevent worsening of your diabetes mellitus. Healthy eating may even help you improve the level of your blood pressure and reach or maintain a healthy weight.  HOW CAN FOOD AFFECT ME? Carbohydrates Carbohydrates affect your blood glucose level more than any other type of food. Your dietitian will help you determine how many carbohydrates to eat at each meal and teach you how to count carbohydrates. Counting carbohydrates is important to keep your blood glucose at a healthy level, especially if you are using insulin or taking certain medicines for diabetes mellitus. Alcohol Alcohol can cause sudden decreases in blood glucose (hypoglycemia), especially if you use insulin or take certain medicines for diabetes mellitus. Hypoglycemia can be a life-threatening condition. Symptoms of hypoglycemia (sleepiness, dizziness, and disorientation) are similar to symptoms of having too much alcohol.  If your health care provider has given you approval to drink alcohol, do so in moderation and use the following guidelines:  Women should not have more than one drink per day, and men should not have more than two drinks per day. One drink is equal to:  12 oz of beer.  5 oz of wine.  1 oz of hard liquor.  Do not drink on an empty stomach.  Keep yourself hydrated. Have water, diet soda, or unsweetened iced tea.  Regular soda, juice, and other mixers might contain a lot of carbohydrates and should be counted. WHAT FOODS ARE  NOT RECOMMENDED? As you make food choices, it is important to remember that all foods are not the same. Some foods have fewer nutrients per serving than other foods, even though they might have the same number of calories or carbohydrates. It is difficult to get your body what it needs when you eat foods with fewer nutrients. Examples of foods that you should avoid that are high in calories and carbohydrates but low in nutrients include:  Trans fats (most processed foods list trans fats on the Nutrition Facts label).  Regular soda.  Juice.  Candy.  Sweets, such as cake, pie, doughnuts, and cookies.  Fried foods. WHAT FOODS CAN I EAT? Have nutrient-rich foods, which will nourish your body and keep you healthy. The food you should eat also will depend on several factors, including:  The calories you need.  The medicines you take.  Your weight.  Your blood glucose level.  Your blood pressure level.  Your cholesterol level. You also should eat a variety of foods, including:  Protein, such as meat, poultry, fish, tofu, nuts, and seeds (lean animal proteins are best).  Fruits.  Vegetables.  Dairy products, such as milk, cheese, and yogurt (low fat is best).  Breads, grains, pasta, cereal, rice, and beans.  Fats such as olive oil, trans fat-free margarine, canola oil, avocado, and olives. DOES EVERYONE WITH DIABETES MELLITUS HAVE THE SAME MEAL PLAN? Because every person with diabetes mellitus is different, there is not one meal plan that works for everyone. It is very important  that you meet with a dietitian who will help you create a meal plan that is just right for you. Document Released: 06/25/2005 Document Revised: 10/03/2013 Document Reviewed: 08/25/2013 [facility] Patient Information 2015 [place], [place]. This information is not intended to replace advice given to you by your health care provider. Make sure you discuss any questions you have with your health care  provider.

## 2015-04-05 LAB — COMPREHENSIVE METABOLIC PANEL
A/G RATIO: 1.7 (ref 1.1–2.5)
ALBUMIN: 4 g/dL (ref 3.5–4.8)
ALT: 14 IU/L (ref 0–44)
AST: 14 IU/L (ref 0–40)
Alkaline Phosphatase: 90 IU/L (ref 39–117)
BUN/Creatinine Ratio: 15 (ref 10–22)
BUN: 18 mg/dL (ref 8–27)
Bilirubin Total: 0.3 mg/dL (ref 0.0–1.2)
CO2: 24 mmol/L (ref 18–29)
Calcium: 9.1 mg/dL (ref 8.6–10.2)
Chloride: 102 mmol/L (ref 97–108)
Creatinine, Ser: 1.18 mg/dL (ref 0.76–1.27)
GFR calc Af Amer: 68 mL/min/{1.73_m2} (ref >59)
GFR, EST NON AFRICAN AMERICAN: 59 mL/min/{1.73_m2} — AB (ref >59)
GLOBULIN, TOTAL: 2.3 g/dL (ref 1.5–4.5)
GLUCOSE: 78 mg/dL (ref 65–99)
Potassium: 4.3 mmol/L (ref 3.5–5.2)
Sodium: 140 mmol/L (ref 134–144)
Total Protein: 6.3 g/dL (ref 6.0–8.5)

## 2015-04-05 LAB — CBC WITH DIFFERENTIAL/PLATELET
BASOS: 0 %
Basophils Absolute: 0 10*3/uL (ref 0.0–0.2)
EOS (ABSOLUTE): 0.1 10*3/uL (ref 0.0–0.4)
Eos: 1 %
Hematocrit: 42.8 % (ref 37.5–51.0)
Hemoglobin: 14.1 g/dL (ref 12.6–17.7)
Immature Grans (Abs): 0 10*3/uL (ref 0.0–0.1)
Immature Granulocytes: 0 %
LYMPHS ABS: 4 10*3/uL — AB (ref 0.7–3.1)
Lymphs: 44 %
MCH: 28.8 pg (ref 26.6–33.0)
MCHC: 32.9 g/dL (ref 31.5–35.7)
MCV: 88 fL (ref 79–97)
MONOS ABS: 0.6 10*3/uL (ref 0.1–0.9)
Monocytes: 6 %
NEUTROS PCT: 49 %
Neutrophils Absolute: 4.4 10*3/uL (ref 1.4–7.0)
Platelets: 165 10*3/uL (ref 150–379)
RBC: 4.89 x10E6/uL (ref 4.14–5.80)
RDW: 14.6 % (ref 12.3–15.4)
WBC: 9.1 10*3/uL (ref 3.4–10.8)

## 2015-04-21 ENCOUNTER — Other Ambulatory Visit: Payer: Self-pay | Admitting: Nurse Practitioner

## 2015-05-27 ENCOUNTER — Other Ambulatory Visit: Payer: Self-pay | Admitting: Nurse Practitioner

## 2015-07-18 ENCOUNTER — Other Ambulatory Visit: Payer: Self-pay | Admitting: Internal Medicine

## 2015-08-06 ENCOUNTER — Encounter: Payer: Self-pay | Admitting: Nurse Practitioner

## 2015-08-06 ENCOUNTER — Ambulatory Visit (INDEPENDENT_AMBULATORY_CARE_PROVIDER_SITE_OTHER): Payer: Medicare Other | Admitting: Nurse Practitioner

## 2015-08-06 VITALS — BP 108/78 | HR 65 | Temp 97.6°F | Resp 20 | Ht 70.0 in | Wt 175.2 lb

## 2015-08-06 DIAGNOSIS — N401 Enlarged prostate with lower urinary tract symptoms: Secondary | ICD-10-CM | POA: Diagnosis not present

## 2015-08-06 DIAGNOSIS — K2100 Gastro-esophageal reflux disease with esophagitis, without bleeding: Secondary | ICD-10-CM

## 2015-08-06 DIAGNOSIS — I10 Essential (primary) hypertension: Secondary | ICD-10-CM | POA: Diagnosis not present

## 2015-08-06 DIAGNOSIS — F32A Depression, unspecified: Secondary | ICD-10-CM

## 2015-08-06 DIAGNOSIS — K649 Unspecified hemorrhoids: Secondary | ICD-10-CM

## 2015-08-06 DIAGNOSIS — F329 Major depressive disorder, single episode, unspecified: Secondary | ICD-10-CM | POA: Diagnosis not present

## 2015-08-06 DIAGNOSIS — N138 Other obstructive and reflux uropathy: Secondary | ICD-10-CM

## 2015-08-06 MED ORDER — VORTIOXETINE HBR 10 MG PO TABS: ORAL_TABLET | ORAL | Status: DC

## 2015-08-06 MED ORDER — HYDROCORTISONE 2.5 % RE CREA: 1.0000 "application " | TOPICAL_CREAM | Freq: Two times a day (BID) | RECTAL | Status: DC

## 2015-08-06 NOTE — Progress Notes (Signed)
Patient ID: Christian Munoz, male   DOB: Mar 15, 1935, 80 y.o.   MRN: [id number]    PCP: [name], NP  Advanced Directive information Does patient have an advance directive?: Yes  Allergies  Allergen Reactions  . Chantix [Varenicline]     Makes patient suicidal  . Zoloft [Sertraline Hcl]     Makes patient suicidal    Chief Complaint  Patient presents with  . Medical Management of Chronic Issues    4 month follow-up for Hyperlipdemia, Hypertension, Depression     HPI: Patient is a 80 y.o. male seen in the office today to follow up on chronic conditions. Pt with hx of hyperlipidemia, htn, depression, GERD, hyperglycemia.  Continues to see psychologist has not seen psychiatrist.  Staying on sofa most days. Does have have much motivation to get up and get things done. No SI or HI Good appetite.  Enjoys reading and politics but have not got involved in politics while here.  Still traveling, planning a trip to [place] in January.  Does not go to AA which is something he used to do regularly.    Review of Systems:  Review of Systems  Constitutional: Negative for activity change, appetite change, fatigue and unexpected weight change.  HENT: Negative for congestion and hearing loss.   Eyes: Negative.   Respiratory: Negative for cough and shortness of breath.   Cardiovascular: Negative for chest pain, palpitations and leg swelling.  Gastrointestinal: Negative for abdominal pain, diarrhea and constipation.       Clear discharge from rectum hemorrhoids   Genitourinary: Negative for dysuria and difficulty urinating.  Musculoskeletal: Negative for myalgias and arthralgias.  Skin: Negative for color change and wound.  Neurological: Negative for dizziness and weakness.  Psychiatric/Behavioral: Negative for behavioral problems, confusion and agitation.       Depression    Past Medical History  Diagnosis Date  . Syncope and collapse   . Lack of coordination   . Allergic  rhinitis due to pollen   . Herpes genitalia   . Vitamin D deficiency   . Other and unspecified hyperlipidemia   . Depressive disorder, not elsewhere classified   . Coronary atherosclerosis of native coronary artery   . Reflux esophagitis   . Hypertrophy of prostate without urinary obstruction and other lower urinary tract symptoms (LUTS)   . Osteoarthrosis, unspecified whether generalized or localized, unspecified site   . Lumbago   . Other malaise and fatigue   . Urinary frequency   . Lack of coordination   . Coronary atherosclerosis of native coronary artery   . Hypertrophy of prostate without urinary obstruction and other lower urinary tract symptoms (LUTS)   . Osteoarthrosis, unspecified whether generalized or localized, unspecified site    Past Surgical History  Procedure Laterality Date  . Poly vocal cords  1985  . Skin cancer excision  2011  . Cardiac catheterization     Social History:   reports that he has been smoking Cigarettes.  He has never used smokeless tobacco. He reports that he does not drink alcohol or use illicit drugs.  Family History  Problem Relation Age of Onset  . Heart disease Father   . Cancer Father   . Cancer Brother   . Cancer Brother     Medications: Patient's Medications  New Prescriptions   No medications on file  Previous Medications   ACYCLOVIR (ZOVIRAX) 800 MG TABLET    Take 800 mg by mouth 2 (two) times daily. Take one tablet once  a day to help prevent flare up   ASPIRIN 81 MG TABLET    Take 81 mg by mouth daily. Take one tablet once a day   BUPROPION (WELLBUTRIN SR) 150 MG 12 HR TABLET    TAKE 1 TO 2 TABLET BY MOUTH EVERY MORNING   CALCIUM CARBONATE (OS-CAL) 600 MG TABS    Take 600 mg by mouth 2 (two) times daily with a meal. Take one tablet once a day for calcium supplement   DULOXETINE (CYMBALTA) 60 MG CAPSULE    TAKE 2 CAPSULES BY MOUTH EVERY DAY   FISH OIL-OMEGA-3 FATTY ACIDS 1000 MG CAPSULE    Take 2 g by mouth daily. Take one  tablet twice a day   GLUCOSAMINE-CHONDROIT-VIT C-MN (GLUCOSAMINE 1500 COMPLEX) CAPS    Take 2 capsules by mouth daily.   METOPROLOL TARTRATE (LOPRESSOR) 25 MG TABLET    TAKE 1 TABLET BY MOUTH TWICE DAILY   MULTIPLE VITAMINS-MINERALS (PRESERVISION AREDS 2) CAPS    Take 2 capsules by mouth daily.   PANTOPRAZOLE (PROTONIX) 20 MG TABLET    Take 1 tablet (20 mg total) by mouth daily.   SIMVASTATIN (ZOCOR) 20 MG TABLET    Take 1 tablet (20 mg total) by mouth daily.   TAMSULOSIN (FLOMAX) 0.4 MG CAPS CAPSULE    Take 1 capsule (0.4 mg total) by mouth daily.  Modified Medications   No medications on file  Discontinued Medications   PANTOPRAZOLE (PROTONIX) 40 MG TABLET    TAKE 1 TABLET BY MOUTH EVERY DAY   SIMVASTATIN (ZOCOR) 40 MG TABLET    TAKE 1 TABLET BY MOUTH EVERY DAY     Physical Exam:  Filed Vitals:   08/06/15 1002  BP: 108/78  Pulse: 65  Temp: 97.6 F (36.4 C)  TempSrc: Oral  Resp: 20  Height: '5\' 10"'$  (1.778 m)  Weight: 175 lb 3.2 oz (79.47 kg)  SpO2: 94%   Body mass index is 25.14 kg/(m^2).  Physical Exam  Constitutional: He is oriented to person, place, and time. He appears well-developed and well-nourished. No distress.  HENT:  Head: Normocephalic and atraumatic.  Eyes: Conjunctivae are normal. Pupils are equal, round, and reactive to light.  Neck: Normal range of motion. Neck supple.  Cardiovascular: Normal rate, regular rhythm and normal heart sounds.   Pulmonary/Chest: Effort normal and breath sounds normal.  Abdominal: Soft. Bowel sounds are normal.  Genitourinary: Rectal exam shows external hemorrhoid (large).  Musculoskeletal: He exhibits no edema or tenderness.  Neurological: He is alert and oriented to person, place, and time.  Skin: Skin is warm and dry. He is not diaphoretic.  Psychiatric: He has a normal mood and affect. His behavior is normal.    Labs reviewed: Basic Metabolic Panel:  Recent Labs  08/09/14 1227 12/03/14 1207 04/04/15 1451  NA 139 141  140  K 4.7 4.2 4.3  CL 100 102 102  CO2 '25 22 24  '$ GLUCOSE 107* 106* 78  BUN '20 19 18  '$ CREATININE 1.48* 1.38* 1.18  CALCIUM 9.1 9.3 9.1   Liver Function Tests:  Recent Labs  08/09/14 1227 04/04/15 1451  AST 18 14  ALT 16 14  ALKPHOS 80 90  BILITOT 0.3 0.3  PROT 6.5 6.3  ALBUMIN 4.2 4.0   No results for input(s): LIPASE, AMYLASE in the last 8760 hours. No results for input(s): AMMONIA in the last 8760 hours. CBC:  Recent Labs  04/04/15 1451  WBC 9.1  NEUTROABS 4.4  HCT 42.8   Lipid  Panel:  Recent Labs  08/09/14 1227  CHOL 136  HDL 36*  LDLCALC 72  TRIG 142  CHOLHDL 3.8   TSH: No results for input(s): TSH in the last 8760 hours. A1C: Lab Results  Component Value Date   HGBA1C 5.9* 12/04/2014     Assessment/Plan 1. Depression -unmotivated to see psychiatrist but feels like medication needs to be changed. Has tried multiple medicaitons in the past without success and used zoloft with side effect -will titrate Cymbalta to 1 tablet daily for 1 week then stop -to decrease wellbutrin to 150 mg daily  - Vortioxetine HBr 10 MG TABS; To start with 5 mg daily for 2 weeks then increase to 10 mg daily  Dispense: 30 tablet -discussed possible side effects and adverse effects and when to call office, pt understands  2. Hemorrhoids, unspecified hemorrhoid type Large hemorrhoid with drainage - hydrocortisone (ANUSOL-HC) 2.5 % rectal cream; Place 1 application rectally 2 (two) times daily.  Dispense: 30 g; Refill: 0  3. Essential hypertension Blood pressure stable, conts on lopressor 25 mg twice daily   4. Reflux esophagitis Stable on protonix 20 mg daily, without increase symptoms  5. BPH (benign prostatic hypertrophy) with urinary obstruction  BPH stable on flomax daily   Follow up in 1 month for depression  Sylvestre Rathgeber K. Harle Munoz  [facility] [phone] 8 am - 5 pm) [phone] (after hours)

## 2015-08-06 NOTE — Patient Instructions (Signed)
Decrease Cymbalta to 30 mg (1 tablet)  And decrease Wellbutrin 150 mg (1 tablet) for 1 week then  start Trintellix 5 mg daily and stop Cymbalta   Cont on tritellix 5 mg daily for 2 week then increase to 10 mg daily   Follow up in 1 month.

## 2015-08-30 ENCOUNTER — Telehealth: Payer: Self-pay

## 2015-08-30 NOTE — Telephone Encounter (Signed)
Message left on triage voicemail: call to discuss side effects to medication.  I called patient back: Day before yesterday patient started Vortioxetine 10 mg, patient had trouble falling asleep and did not sleep many hours. Patient was also teary. When patient was taking 5 mg his appetite increased which online it said it should decrease.  Please advise

## 2015-08-30 NOTE — Telephone Encounter (Signed)
To take medication in the morning to see if that helps with symptoms. Will monitor appetite.

## 2015-08-30 NOTE — Telephone Encounter (Signed)
Patient aware of recommendations.  

## 2015-09-10 ENCOUNTER — Ambulatory Visit (INDEPENDENT_AMBULATORY_CARE_PROVIDER_SITE_OTHER): Payer: Medicare Other | Admitting: Nurse Practitioner

## 2015-09-10 ENCOUNTER — Encounter: Payer: Self-pay | Admitting: Nurse Practitioner

## 2015-09-10 VITALS — BP 124/78 | HR 62 | Temp 98.2°F | Resp 12 | Ht 70.0 in | Wt 178.0 lb

## 2015-09-10 DIAGNOSIS — K649 Unspecified hemorrhoids: Secondary | ICD-10-CM | POA: Diagnosis not present

## 2015-09-10 DIAGNOSIS — F329 Major depressive disorder, single episode, unspecified: Secondary | ICD-10-CM

## 2015-09-10 DIAGNOSIS — F32A Depression, unspecified: Secondary | ICD-10-CM

## 2015-09-10 MED ORDER — VORTIOXETINE HBR 10 MG PO TABS: 10.0000 mg | ORAL_TABLET | Freq: Every day | ORAL | Status: DC

## 2015-09-10 NOTE — Progress Notes (Signed)
Patient ID: Christian Munoz, male   DOB: Nov 17, 1934, 80 y.o.   MRN: [id number]    PCP: [name], NP  Advanced Directive information Does patient have an advance directive?: Yes, Type of Advance Directive: [place];Living will, Does patient want to make changes to advanced directive?: No - Patient declined  Allergies  Allergen Reactions  . Chantix [Varenicline]     Makes patient suicidal  . Zoloft [Sertraline Hcl]     Makes patient suicidal    Chief Complaint  Patient presents with  . Medical Management of Chronic Issues    1 month follow-up on depression, new medication not working. Patient c/o hemorrhoids (no pain)      HPI: Patient is a 80 y.o. male seen in the office today to follow up on depression . Pt with hx of hyperlipidemia, htn, depression, GERD, hyperglycemia. Had not seen psychiatrist and needing change with medication due to worsening depression.  Continues to see psychologist.  Last visit cymbalta was titrated off and instructed to to decrease wellbutrin to 150 mg daily   Pt was started on Vortioxetine 10 MG TABS; To start with 5 mg daily for 2 weeks then increase to 10 mg daily   Does not fell any difference while on the Vortioxetine. No increase in lack of energy, pt reports he can stay in bed until noon but this is not new.  Does report he is exercising more.  Election depressed him more. Reports still very unmotivated  Increased appetite.   Believes hemorrhoid has improved, just started using cream 2 weeks ago. No pain.    Review of Systems:  Review of Systems  Constitutional: Negative for activity change, appetite change, fatigue and unexpected weight change.  Eyes: Negative.   Respiratory: Negative for shortness of breath.   Cardiovascular: Negative for chest pain.  Gastrointestinal: Negative for diarrhea and constipation.  Genitourinary: Negative for dysuria and difficulty urinating.  Musculoskeletal: Negative for  myalgias and arthralgias.  Neurological: Negative for dizziness and weakness.  Psychiatric/Behavioral: Positive for sleep disturbance (increase in sleep). Negative for suicidal ideas, behavioral problems, confusion, self-injury and agitation. The patient is not nervous/anxious.        Depression    Past Medical History  Diagnosis Date  . Syncope and collapse   . Lack of coordination   . Allergic rhinitis due to pollen   . Herpes genitalia   . Vitamin D deficiency   . Other and unspecified hyperlipidemia   . Depressive disorder, not elsewhere classified   . Coronary atherosclerosis of native coronary artery   . Reflux esophagitis   . Hypertrophy of prostate without urinary obstruction and other lower urinary tract symptoms (LUTS)   . Osteoarthrosis, unspecified whether generalized or localized, unspecified site   . Lumbago   . Other malaise and fatigue   . Urinary frequency   . Lack of coordination   . Coronary atherosclerosis of native coronary artery   . Hypertrophy of prostate without urinary obstruction and other lower urinary tract symptoms (LUTS)   . Osteoarthrosis, unspecified whether generalized or localized, unspecified site    Past Surgical History  Procedure Laterality Date  . Poly vocal cords  1985  . Skin cancer excision  2011  . Cardiac catheterization     Social History:   reports that he has been smoking Cigarettes.  He has never used smokeless tobacco. He reports that he does not drink alcohol or use illicit drugs.  Family History  Problem Relation Age of  Onset  . Heart disease Father   . Cancer Father   . Cancer Brother   . Cancer Brother     Medications: Patient's Medications  New Prescriptions   No medications on file  Previous Medications   ACYCLOVIR (ZOVIRAX) 800 MG TABLET    Take 800 mg by mouth 2 (two) times daily. Take one tablet once a day to help prevent flare up   ASPIRIN 81 MG TABLET    Take 81 mg by mouth daily. Take one tablet once a day    BUPROPION (WELLBUTRIN SR) 150 MG 12 HR TABLET    TAKE 1 TO 2 TABLET BY MOUTH EVERY MORNING   CALCIUM CARBONATE (OS-CAL) 600 MG TABS    Take 600 mg by mouth 2 (two) times daily with a meal. Take one tablet once a day for calcium supplement   FISH OIL-OMEGA-3 FATTY ACIDS 1000 MG CAPSULE    Take 2 g by mouth daily. Take one tablet twice a day   GLUCOSAMINE-CHONDROIT-VIT C-MN (GLUCOSAMINE 1500 COMPLEX) CAPS    Take 2 capsules by mouth daily.   HYDROCORTISONE (ANUSOL-HC) 2.5 % RECTAL CREAM    Place 1 application rectally 2 (two) times daily.   METOPROLOL TARTRATE (LOPRESSOR) 25 MG TABLET    TAKE 1 TABLET BY MOUTH TWICE DAILY   MULTIPLE VITAMINS-MINERALS (PRESERVISION AREDS 2) CAPS    Take 2 capsules by mouth daily.   PANTOPRAZOLE (PROTONIX) 20 MG TABLET    Take 1 tablet (20 mg total) by mouth daily.   SIMVASTATIN (ZOCOR) 20 MG TABLET    Take 1 tablet (20 mg total) by mouth daily.   TAMSULOSIN (FLOMAX) 0.4 MG CAPS CAPSULE    Take 1 capsule (0.4 mg total) by mouth daily.   VORTIOXETINE HBR 10 MG TABS    To start with 5 mg daily for 2 weeks then increase to 10 mg daily  Modified Medications   No medications on file  Discontinued Medications   No medications on file     Physical Exam:  Filed Vitals:   09/10/15 1036  BP: 124/78  Pulse: 62  Temp: 98.2 F (36.8 C)  TempSrc: Oral  Resp: 12  Height: '5\' 10"'$  (1.778 m)  Weight: 178 lb (80.74 kg)  SpO2: 93%   Body mass index is 25.54 kg/(m^2).  Physical Exam  Constitutional: He is oriented to person, place, and time. He appears well-developed and well-nourished. No distress.  HENT:  Head: Normocephalic and atraumatic.  Eyes: Conjunctivae are normal. Pupils are equal, round, and reactive to light.  Neck: Normal range of motion. Neck supple.  Cardiovascular: Normal rate, regular rhythm and normal heart sounds.   Pulmonary/Chest: Effort normal and breath sounds normal.  Abdominal: Soft. Bowel sounds are normal.  Neurological: He is alert and  oriented to person, place, and time.  Skin: Skin is warm and dry. He is not diaphoretic.  Psychiatric: He has a normal mood and affect. His behavior is normal.    Labs reviewed: Basic Metabolic Panel:  Recent Labs  12/03/14 1207 04/04/15 1451  NA 141 140  K 4.2 4.3  CL 102 102  CO2 22 24  GLUCOSE 106* 78  BUN 19 18  CREATININE 1.38* 1.18  CALCIUM 9.3 9.1   Liver Function Tests:  Recent Labs  04/04/15 1451  AST 14  ALT 14  ALKPHOS 90  BILITOT 0.3  PROT 6.3  ALBUMIN 4.0   No results for input(s): LIPASE, AMYLASE in the last 8760 hours. No results for  input(s): AMMONIA in the last 8760 hours. CBC:  Recent Labs  04/04/15 1451  WBC 9.1  NEUTROABS 4.4  HCT 42.8   Lipid Panel: No results for input(s): CHOL, HDL, LDLCALC, TRIG, CHOLHDL, LDLDIRECT in the last 8760 hours. TSH: No results for input(s): TSH in the last 8760 hours. A1C: Lab Results  Component Value Date   HGBA1C 5.9* 12/04/2014     Assessment/Plan 1. Depression -unchanged, without side effects noted from Vortioxetine, will cont for now  - Vortioxetine HBr 10 MG TABS; Take 1 tablet (10 mg total) by mouth daily.  Dispense: 30 tablet; Refill: 3  2. Hemorrhoids, unspecified hemorrhoid type -stable, cont anusol as needed   Follow up in 3 months for routine follow up  [place]. Harle Battiest  [facility] & [facility] [phone] 8 am - 5 pm) [phone] (after hours)

## 2015-09-10 NOTE — Patient Instructions (Addendum)
Bring copy of [place] and/or Living Will to next appointment.   Cont current medications as prescribed

## 2015-09-23 ENCOUNTER — Other Ambulatory Visit: Payer: Self-pay | Admitting: Nurse Practitioner

## 2015-09-24 ENCOUNTER — Telehealth: Payer: Self-pay | Admitting: *Deleted

## 2015-09-24 NOTE — Telephone Encounter (Signed)
Patient called and Left message on voicemail stating he hadconcerns with new Depression medication. Tried calling patient back and Left message to return call.

## 2015-09-24 NOTE — Telephone Encounter (Signed)
[employer] Prior Authorization form completed for patient's Trintellix and faxed to Fax#[phone] #:[phone] for depression. Medications used in the past and failed Wellbutrin and Cymbalta. Sherrie Mustache signed  Member ID#: [id number]

## 2015-09-25 NOTE — Telephone Encounter (Signed)
Called patient regarding questions about new depression medication, LMOM for him to return call to the office. Received approved letter for medication Trintellix

## 2015-09-27 NOTE — Telephone Encounter (Signed)
Patient stated that he is having problems with his new Depression medication Trintellix. Stated he feels no difference, not taking any naps during the day but still sleeping till 1:00 in the afternoon and his motivation is no better. Wants something different. Please Advise.

## 2015-09-27 NOTE — Telephone Encounter (Signed)
Pt needs to see a psychiatrist for further evaluation, would not recommended changing medications since we just started that one, can go up on dose if he would like but ultimately needs to be followed by psychiatrist at this point

## 2015-09-30 NOTE — Telephone Encounter (Signed)
Patient notified and he will set up his psychiatrist appointment.

## 2015-11-12 ENCOUNTER — Other Ambulatory Visit: Payer: Self-pay | Admitting: Nurse Practitioner

## 2015-12-10 ENCOUNTER — Ambulatory Visit (INDEPENDENT_AMBULATORY_CARE_PROVIDER_SITE_OTHER): Payer: Medicare Other | Admitting: Nurse Practitioner

## 2015-12-10 ENCOUNTER — Encounter: Payer: Self-pay | Admitting: Nurse Practitioner

## 2015-12-10 VITALS — BP 110/68 | HR 53 | Temp 97.4°F | Resp 20 | Ht 70.0 in | Wt 173.8 lb

## 2015-12-10 DIAGNOSIS — K2100 Gastro-esophageal reflux disease with esophagitis, without bleeding: Secondary | ICD-10-CM

## 2015-12-10 DIAGNOSIS — E785 Hyperlipidemia, unspecified: Secondary | ICD-10-CM | POA: Diagnosis not present

## 2015-12-10 DIAGNOSIS — N401 Enlarged prostate with lower urinary tract symptoms: Secondary | ICD-10-CM

## 2015-12-10 DIAGNOSIS — R7303 Prediabetes: Secondary | ICD-10-CM

## 2015-12-10 DIAGNOSIS — F329 Major depressive disorder, single episode, unspecified: Secondary | ICD-10-CM | POA: Diagnosis not present

## 2015-12-10 DIAGNOSIS — K649 Unspecified hemorrhoids: Secondary | ICD-10-CM | POA: Diagnosis not present

## 2015-12-10 DIAGNOSIS — I10 Essential (primary) hypertension: Secondary | ICD-10-CM | POA: Diagnosis not present

## 2015-12-10 DIAGNOSIS — N138 Other obstructive and reflux uropathy: Secondary | ICD-10-CM

## 2015-12-10 DIAGNOSIS — F32A Depression, unspecified: Secondary | ICD-10-CM

## 2015-12-10 NOTE — Patient Instructions (Signed)
Follow up in 6 months for routine follow up, sooner if needed  Will get blood work today  Increase fiber intake

## 2015-12-10 NOTE — Progress Notes (Signed)
Patient ID: Christian Munoz, male   DOB: 20-May-1935, 81 y.o.   MRN: [id number]    PCP: [name], NP  Advanced Directive information Does patient have an advance directive?: Yes, Type of Advance Directive: Walker, Does patient want to make changes to advanced directive?: Yes - information given  Allergies  Allergen Reactions  . Chantix [Varenicline]     Makes patient suicidal  . Zoloft [Sertraline Hcl]     Makes patient suicidal    Chief Complaint  Patient presents with  . Medical Management of Chronic Issues    3 month follow-up      HPI: Patient is a 81 y.o. male seen in the office today for routine follow up. Pt with hx of depression.  Did not on his trip due to his depression. Has made appt with psychiatrist-- seeing them march 10th. Cont on Wellbutrin and Vortiozetine but not effective.   conts to leak from anus with hemorrhoids, no increase in pain.   Has not taken acyclovir in 6-8 months to prevent herpes flare up. Pt reports he has not any lesions in years.   conts on Protonix for acid reflux. No symptoms of GERD.   Increase in urination which has been stable for 2 days, following with urologist every 2 years. conts on Flomax   Had cup of coffee this morning with a small amount of cream.   Review of Systems:  Review of Systems  Constitutional: Negative for activity change, appetite change, fatigue and unexpected weight change.  Eyes: Negative.   Respiratory: Negative for shortness of breath.   Cardiovascular: Negative for chest pain.  Gastrointestinal: Negative for diarrhea and constipation.  Genitourinary: Negative for dysuria and difficulty urinating.       Getting up every 2 hours to urinate. Been going to urologist   Musculoskeletal: Negative for myalgias and arthralgias.  Neurological: Negative for dizziness and weakness.  Psychiatric/Behavioral: Positive for sleep disturbance (increase in sleep). Negative for suicidal  ideas, behavioral problems, confusion, self-injury and agitation. The patient is not nervous/anxious.        Depression    Past Medical History  Diagnosis Date  . Syncope and collapse   . Lack of coordination   . Allergic rhinitis due to pollen   . Herpes genitalia   . Vitamin D deficiency   . Other and unspecified hyperlipidemia   . Depressive disorder, not elsewhere classified   . Coronary atherosclerosis of native coronary artery   . Reflux esophagitis   . Hypertrophy of prostate without urinary obstruction and other lower urinary tract symptoms (LUTS)   . Osteoarthrosis, unspecified whether generalized or localized, unspecified site   . Lumbago   . Other malaise and fatigue   . Urinary frequency   . Lack of coordination   . Coronary atherosclerosis of native coronary artery   . Hypertrophy of prostate without urinary obstruction and other lower urinary tract symptoms (LUTS)   . Osteoarthrosis, unspecified whether generalized or localized, unspecified site    Past Surgical History  Procedure Laterality Date  . Poly vocal cords  1985  . Skin cancer excision  2011  . Cardiac catheterization     Social History:   reports that he has been smoking Cigarettes.  He has never used smokeless tobacco. He reports that he does not drink alcohol or use illicit drugs.  Family History  Problem Relation Age of Onset  . Heart disease Father   . Cancer Father   . Cancer Brother   .  Cancer Brother     Medications: Patient's Medications  New Prescriptions   No medications on file  Previous Medications   ACYCLOVIR (ZOVIRAX) 800 MG TABLET    Take 800 mg by mouth 2 (two) times daily as needed. Take one tablet once a day to help prevent flare up   ASPIRIN 81 MG TABLET    Take 81 mg by mouth daily. Take one tablet once a day   BUPROPION (WELLBUTRIN SR) 150 MG 12 HR TABLET    TAKE 1 TO 2 TABLETS BY MOUTH EVERY MORNING   CALCIUM CARBONATE (OS-CAL) 600 MG TABS    Take 600 mg by mouth 2 (two)  times daily with a meal. Take one tablet once a day for calcium supplement   FISH OIL-OMEGA-3 FATTY ACIDS 1000 MG CAPSULE    Take 2 g by mouth daily. Take one tablet twice a day   GLUCOSAMINE-CHONDROIT-VIT C-MN (GLUCOSAMINE 1500 COMPLEX) CAPS    Take 2 capsules by mouth daily.   HYDROCORTISONE (ANUSOL-HC) 2.5 % RECTAL CREAM    Place 1 application rectally 2 (two) times daily.   METOPROLOL TARTRATE (LOPRESSOR) 25 MG TABLET    TAKE 1 TABLET BY MOUTH TWICE DAILY   MULTIPLE VITAMINS-MINERALS (PRESERVISION AREDS 2) CAPS    Take 2 capsules by mouth daily.   PANTOPRAZOLE (PROTONIX) 20 MG TABLET    Take 1 tablet (20 mg total) by mouth daily.   SIMVASTATIN (ZOCOR) 20 MG TABLET    Take 1 tablet (20 mg total) by mouth daily.   TAMSULOSIN (FLOMAX) 0.4 MG CAPS CAPSULE    TAKE 1 CAPSULE(0.4 MG) BY MOUTH DAILY   VORTIOXETINE HBR 10 MG TABS    Take 1 tablet (10 mg total) by mouth daily.  Modified Medications   No medications on file  Discontinued Medications   No medications on file     Physical Exam:  Filed Vitals:   12/10/15 1009  BP: 110/68  Pulse: 53  Temp: 97.4 F (36.3 C)  TempSrc: Oral  Resp: 20  Height: '5\' 10"'$  (1.778 m)  Weight: 173 lb 12.8 oz (78.835 kg)  SpO2: 94%   Body mass index is 24.94 kg/(m^2).  Physical Exam  Constitutional: He is oriented to person, place, and time. He appears well-developed and well-nourished. No distress.  HENT:  Head: Normocephalic and atraumatic.  Eyes: Conjunctivae are normal. Pupils are equal, round, and reactive to light.  Neck: Normal range of motion. Neck supple.  Cardiovascular: Normal rate, regular rhythm and normal heart sounds.   Pulmonary/Chest: Effort normal and breath sounds normal.  Abdominal: Soft. Bowel sounds are normal.  Musculoskeletal: He exhibits no edema.  Neurological: He is alert and oriented to person, place, and time.  Skin: Skin is warm and dry. He is not diaphoretic.  Psychiatric: He has a normal mood and affect. His  behavior is normal.    Labs reviewed: Basic Metabolic Panel:  Recent Labs  04/04/15 1451  NA 140  K 4.3  CL 102  CO2 24  GLUCOSE 78  BUN 18  CREATININE 1.18  CALCIUM 9.1   Liver Function Tests:  Recent Labs  04/04/15 1451  AST 14  ALT 14  ALKPHOS 90  BILITOT 0.3  PROT 6.3  ALBUMIN 4.0   No results for input(s): LIPASE, AMYLASE in the last 8760 hours. No results for input(s): AMMONIA in the last 8760 hours. CBC:  Recent Labs  04/04/15 1451  WBC 9.1  NEUTROABS 4.4  HCT 42.8  MCV 88  PLT 165   Lipid Panel: No results for input(s): CHOL, HDL, LDLCALC, TRIG, CHOLHDL, LDLDIRECT in the last 8760 hours. TSH: No results for input(s): TSH in the last 8760 hours. A1C: Lab Results  Component Value Date   HGBA1C 5.9* 12/04/2014     Assessment/Plan 1. Depression -not well managed on current regimen, no SI or HI. Has set up appt with psychiatrist at this time - Vitamin D, 25-hydroxy - TSH  2. Essential hypertension -well controlled on lopressor. conts on ASA daily - Comprehensive metabolic panel  3. Reflux esophagitis -well controlled on protonix 20 mg daily  4. BPH (benign prostatic hypertrophy) with urinary obstruction Unchanged, conts on flomax 0.4 mg daily  5. Prediabetes -diet controlled, will follow up Hemoglobin A1c  6. Hyperlipidemia -no recent Lipid panel, will follow up today. Pt has had a little bit of low fat cream in coffee today. Pt conts on zocor daily  7. Hemorrhoids, unspecified hemorrhoid type -encouraged high fiber diet. Avoiding constipation. conts anusol  Follow up in 6 months, sooner if needed   Essie Gehret K. Harle Battiest  [facility] [phone] 8 am - 5 pm) [phone] (after hours)

## 2015-12-11 LAB — HEMOGLOBIN A1C
Est. average glucose Bld gHb Est-mCnc: 123 mg/dL
HEMOGLOBIN A1C: 5.9 % — AB (ref 4.8–5.6)

## 2015-12-11 LAB — LIPID PANEL
CHOL/HDL RATIO: 3.3 ratio (ref 0.0–5.0)
CHOLESTEROL TOTAL: 123 mg/dL (ref 100–199)
HDL: 37 mg/dL — AB (ref >39)
LDL CALC: 70 mg/dL (ref 0–99)
TRIGLYCERIDES: 81 mg/dL (ref 0–149)
VLDL CHOLESTEROL CAL: 16 mg/dL (ref 5–40)

## 2015-12-11 LAB — COMPREHENSIVE METABOLIC PANEL
A/G RATIO: 1.9 (ref 1.1–2.5)
ALBUMIN: 4.1 g/dL (ref 3.5–4.8)
ALT: 14 IU/L (ref 0–44)
AST: 18 IU/L (ref 0–40)
Alkaline Phosphatase: 77 IU/L (ref 39–117)
BUN/Creatinine Ratio: 19 (ref 10–22)
BUN: 23 mg/dL (ref 8–27)
Bilirubin Total: 0.3 mg/dL (ref 0.0–1.2)
CALCIUM: 9.1 mg/dL (ref 8.6–10.2)
CO2: 21 mmol/L (ref 18–29)
CREATININE: 1.23 mg/dL (ref 0.76–1.27)
Chloride: 101 mmol/L (ref 96–106)
GFR, EST AFRICAN AMERICAN: 65 mL/min/{1.73_m2} (ref >59)
GFR, EST NON AFRICAN AMERICAN: 56 mL/min/{1.73_m2} — AB (ref >59)
GLOBULIN, TOTAL: 2.2 g/dL (ref 1.5–4.5)
Glucose: 102 mg/dL — ABNORMAL HIGH (ref 65–99)
Potassium: 4.8 mmol/L (ref 3.5–5.2)
SODIUM: 140 mmol/L (ref 134–144)
TOTAL PROTEIN: 6.3 g/dL (ref 6.0–8.5)

## 2015-12-11 LAB — VITAMIN D 25 HYDROXY (VIT D DEFICIENCY, FRACTURES): Vit D, 25-Hydroxy: 35.2 ng/mL (ref 30.0–100.0)

## 2015-12-11 LAB — TSH: TSH: 4.69 u[IU]/mL — ABNORMAL HIGH (ref 0.450–4.500)

## 2015-12-20 ENCOUNTER — Ambulatory Visit (INDEPENDENT_AMBULATORY_CARE_PROVIDER_SITE_OTHER): Payer: No Typology Code available for payment source | Admitting: Psychiatry

## 2015-12-20 DIAGNOSIS — F329 Major depressive disorder, single episode, unspecified: Secondary | ICD-10-CM

## 2015-12-20 MED ORDER — BUPROPION HCL ER (XL) 150 MG PO TB24: ORAL_TABLET | ORAL | Status: DC

## 2015-12-20 NOTE — Progress Notes (Signed)
Psychiatric Initial Adult Assessment   Patient Identification: Christian Munoz MRN:  [id number] Date of Evaluation:  12/20/2015 Referral Source: Dr. Sherrie Mustache Chief Complaint:   no motivation Visit Diagnosis: Major depression, chronic, mild Diagnosis:  Major depression, chronic, mild Patient Active Problem List   Diagnosis Date Noted  . Essential hypertension [I10] 12/04/2014  . BPH (benign prostatic hypertrophy) with urinary obstruction [N40.1] 12/04/2014  . Depression [F32.9] 12/04/2014  . Hyperlipidemia [E78.5] 01/06/2013  . Depressive disorder, not elsewhere classified [F32.9] 01/06/2013  . Unspecified essential hypertension [I10] 01/06/2013  . Reflux esophagitis [K21.0]   . Hypertrophy of prostate without urinary obstruction and other lower urinary tract symptoms (LUTS) [N40.0]   . 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 He is divorced since 64 and spends time with his granddaughter age 73 a few days a week. The patient is seen a psychiatrist for years and in therapy as well. He's been on multiple antidepressants. The patient is a poor historian of the specific symptoms. As best as he can think and 1990 had his last big episode of depression. Since November he been talking with his primary care doctor about his depression and she decided to take them all Cymbalta and put him on Brintellix. He's been taking 10 mg since November. He says he feels no better. At this time the patient is not dating. Noted is his brother and his sister-in-law died this year. His health status is pretty good. He has no financial complaints. In a close evaluation the patient denies persistent daily depression. He denies anhedonia. Enjoys reading working on the computer watching TV and listening to music. The patient says his sleep is disturbed in that he is taking a long  time to fall off to sleep and overall he sleeping excessively since being on trach Brintellix. His energy level is low. The patient says he has no motivation and if not having to get up he would not get off the couch. The patient denies problems thinking and concentrating. He is eating well. He denies a sense of worthlessness. He is not suicidal and never has been. The patient had a significant problem without back in the 1980s and went to [place]. He doesn't drink any alcohol at this time. He denies the use of any illicit drugs. He denies symptoms of mania. His episodes of major depression are not clear but being that he seen a psychiatrist and been on multiple medications I suspect he has this condition. The patient was psychiatrically hospitalized in 1969 for depression but was also drinking a lot of alcohol at the time. I'm not clear if this patient is ever truly been in psychotherapy. The patient has been on multiple medications in the past including Zoloft which oversedated him and recently coming off Cymbalta. The patient denies the use of cigarettes. The patient has an upper level education very close to achieving a PhD. 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 Elements:   Associated Signs/Symptoms: Depression Symptoms:  fatigue, (Hypo) Manic Symptoms:   Anxiety Symptoms:   Psychotic Symptoms:   PTSD Symptoms:   Past Medical History:  Past Medical History  Diagnosis Date  . Syncope and collapse   . Lack of coordination   . Allergic rhinitis due to pollen   . Herpes genitalia   . Vitamin D deficiency   . Other and unspecified hyperlipidemia   . Depressive disorder, not  elsewhere classified   . Coronary atherosclerosis of native coronary artery   . Reflux esophagitis   . Hypertrophy of prostate without urinary obstruction and other lower urinary tract symptoms (LUTS)   . Osteoarthrosis, unspecified whether generalized or localized, unspecified site   . Lumbago   . Other malaise and  fatigue   . Urinary frequency   . Lack of coordination   . Coronary atherosclerosis of native coronary artery   . Hypertrophy of prostate without urinary obstruction and other lower urinary tract symptoms (LUTS)   . Osteoarthrosis, unspecified whether generalized or localized, unspecified site     Past Surgical History  Procedure Laterality Date  . Poly vocal cords  1985  . Skin cancer excision  2011  . Cardiac catheterization     Family History:  Family History  Problem Relation Age of Onset  . Heart disease Father   . Cancer Father   . Cancer Brother   . Cancer Brother    Social History:   Social History   Social History  . Marital Status: Married    Spouse Name: N/A  . Number of Children: N/A  . Years of Education: N/A   Social History Main Topics  . Smoking status: Light Tobacco Smoker    Types: Cigarettes  . Smokeless tobacco: Never Used     Comment: keep trying   . Alcohol Use: No  . Drug Use: No  . Sexual Activity: Not on file   Other Topics Concern  . Not on file   Social History Narrative   Additional Social History:   Musculoskeletal: Strength & Muscle Tone: within normal limits Gait & Station: normal Patient leans: N/A  Psychiatric Specialty Exam: HPI  ROS  There were no vitals taken for this visit.There is no weight on file to calculate BMI.  General Appearance: Casual  Eye Contact:  Good  Speech:  Clear and Coherent  Volume:  Normal  Mood:  NA  Affect:  Congruent  Thought Process:  Goal Directed  Orientation:  Full (Time, Place, and Person)  Thought Content:  WDL  Suicidal Thoughts:  No  Homicidal Thoughts:  No  Memory:  NA  Judgement:  Good  Insight:  Good  Psychomotor Activity:  Normal  Concentration:  Good  Recall:  [place] of Knowledge:Fair  Language: Good  Akathisia:  No  Handed:  Right  AIMS (if indicated):    Assets:  Desire for Improvement  ADL's:  Intact  Cognition: WNL  Sleep:     Is the patient at risk to  self?  No. Has the patient been a risk to self in the past 6 months?  No. Has the patient been a risk to self within the distant past?  No. Is the patient a risk to others?  No. Has the patient been a risk to others in the past 6 months?  No. Has the patient been a risk to others within the distant past?  No.  Allergies:   Allergies  Allergen Reactions  . Chantix [Varenicline]     Makes patient suicidal  . Zoloft [Sertraline Hcl]     Makes patient suicidal   Current Medications: Current Outpatient Prescriptions  Medication Sig Dispense Refill  . acyclovir (ZOVIRAX) 800 MG tablet Take 800 mg by mouth 2 (two) times daily as needed. Take one tablet once a day to help prevent flare up    . aspirin 81 MG tablet Take 81 mg by mouth daily. Take one tablet once  a day    . buPROPion (WELLBUTRIN SR) 150 MG 12 hr tablet TAKE 1 TO 2 TABLETS BY MOUTH EVERY MORNING 60 tablet 3  . buPROPion (WELLBUTRIN XL) 150 MG 24 hr tablet 2  qam 30 tablet 3  . calcium carbonate (OS-CAL) 600 MG TABS Take 600 mg by mouth 2 (two) times daily with a meal. Take one tablet once a day for calcium supplement    . fish oil-omega-3 fatty acids 1000 MG capsule Take 2 g by mouth daily. Take one tablet twice a day    . Glucosamine-Chondroit-Vit C-Mn (GLUCOSAMINE 1500 COMPLEX) CAPS Take 2 capsules by mouth daily.    . hydrocortisone (ANUSOL-HC) 2.5 % rectal cream Place 1 application rectally 2 (two) times daily. 30 g 0  . metoprolol tartrate (LOPRESSOR) 25 MG tablet TAKE 1 TABLET BY MOUTH TWICE DAILY 180 tablet 3  . Multiple Vitamins-Minerals (PRESERVISION AREDS 2) CAPS Take 2 capsules by mouth daily.    . pantoprazole (PROTONIX) 20 MG tablet Take 1 tablet (20 mg total) by mouth daily. 90 tablet 3  . simvastatin (ZOCOR) 20 MG tablet Take 1 tablet (20 mg total) by mouth daily. 90 tablet 3  . tamsulosin (FLOMAX) 0.4 MG CAPS capsule TAKE 1 CAPSULE(0.4 MG) BY MOUTH DAILY 90 capsule 1  . Vortioxetine HBr 10 MG TABS Take 1 tablet  (10 mg total) by mouth daily. 30 tablet 3   No current facility-administered medications for this visit.    Previous Psychotropic Medications: Yes   Substance Abuse History in the last 12 months:  No.  Consequences of Substance Abuse:   Medical Decision Making:  New problem, with additional work up planned  Treatment Plan Summary: At this time the patient will discontinue his Brintellix. It has not been helpful. Instead he will change from Wellbutrin slow release to Wellbutrin extended release and take 300 mg in the morning. Possibility of in the future to increase it to the maximum dose of 450 is possible. In my view the only target symptoms seem to be a lack of energy and perhaps lack of the ability to have focus and importance in his future. The patient has issues with passion and purpose. The patient is resistant to being back in therapy. This patient is safe. He is not suicidal nor is he homicidal. Is no physical complaints. I think once we got his sleep improved back to normal he feel better and perhaps have more motivation. His issue is around motivation which I think is related to having purpose and passion. This patient to return to see me in 7 weeks.    Giovanne Nickolson, [place] 3/10/201711:05 AM

## 2016-01-16 ENCOUNTER — Other Ambulatory Visit: Payer: Self-pay | Admitting: Nurse Practitioner

## 2016-01-16 NOTE — Telephone Encounter (Signed)
Left message on machine for patient to return call when available

## 2016-01-17 NOTE — Telephone Encounter (Signed)
Patient called back and he is no longer taking trintellix.

## 2016-01-21 ENCOUNTER — Telehealth (HOSPITAL_COMMUNITY): Payer: Self-pay

## 2016-01-21 DIAGNOSIS — F329 Major depressive disorder, single episode, unspecified: Secondary | ICD-10-CM

## 2016-01-21 MED ORDER — VENLAFAXINE HCL ER 75 MG PO CP24: 75.0000 mg | ORAL_CAPSULE | Freq: Every day | ORAL | Status: DC

## 2016-01-21 MED ORDER — LORAZEPAM 0.5 MG PO TABS: 0.5000 mg | ORAL_TABLET | Freq: Every day | ORAL | Status: DC

## 2016-01-21 NOTE — Telephone Encounter (Signed)
Patient called back and he was still having a problem sleeping longer that 2 hours at a time, patient had stopped taking the Wellbutrin a couple of days ago. I called and spoke to Dr. Casimiro Needle and he would like me to send in an order for Effexor 75 mg 1 po qd and ativan 0.5 mg 1 po qhs prn. Patient is to discontinue the wellbutrin. I sent in the medication to the pharmacy and left a detailed message for the patient on how to take medication. I left our number and asked patient to please call back in 2 weeks to let us know how he is doing.

## 2016-02-12 ENCOUNTER — Encounter: Payer: Self-pay | Admitting: Internal Medicine

## 2016-02-12 ENCOUNTER — Ambulatory Visit (INDEPENDENT_AMBULATORY_CARE_PROVIDER_SITE_OTHER): Payer: Medicare Other | Admitting: Internal Medicine

## 2016-02-12 VITALS — BP 118/76 | HR 63 | Temp 97.9°F | Ht 70.0 in | Wt 181.0 lb

## 2016-02-12 DIAGNOSIS — I10 Essential (primary) hypertension: Secondary | ICD-10-CM | POA: Diagnosis not present

## 2016-02-12 DIAGNOSIS — F329 Major depressive disorder, single episode, unspecified: Secondary | ICD-10-CM

## 2016-02-12 DIAGNOSIS — Z9181 History of falling: Secondary | ICD-10-CM | POA: Diagnosis not present

## 2016-02-12 DIAGNOSIS — F32A Depression, unspecified: Secondary | ICD-10-CM

## 2016-02-12 NOTE — Patient Instructions (Signed)
Ty Aspercream with Lidocaine on your calves to relieve pain.

## 2016-02-12 NOTE — Progress Notes (Signed)
Patient ID: Christian Munoz, male   DOB: 19-Mar-1935, 81 y.o.   MRN: [id number]    Facility  [facility]    Place of Service:   OFFICE    Allergies  Allergen Reactions  . Chantix [Varenicline]     Makes patient suicidal  . Zoloft [Sertraline Hcl]     Makes patient suicidal    Chief Complaint  Patient presents with  . Acute Visit    falling, 3 times in 2 weeks, not tripping, not unbalance, not passing out, "just the hell of it".    HPI:  Patient states he has fallen 3 times in the last 2 weeks. He denies tripping, becoming unbalanced, or blackouts. He doesn't understand why he was falling. He was encouraged by friends and family to see his physician about it. Patient has previously seen Sherrie Mustache and heme upon the  Medications: Patient's Medications  New Prescriptions   No medications on file  Previous Medications   ACYCLOVIR (ZOVIRAX) 800 MG TABLET    Take 800 mg by mouth 2 (two) times daily as needed. Take one tablet once a day to help prevent flare up   ASPIRIN 81 MG TABLET    Take 81 mg by mouth daily. Take one tablet once a day   CALCIUM CARBONATE (OS-CAL) 600 MG TABS    Take 600 mg by mouth 2 (two) times daily with a meal. Take one tablet once a day for calcium supplement   FISH OIL-OMEGA-3 FATTY ACIDS 1000 MG CAPSULE    Take 2 g by mouth daily. Take one tablet twice a day   GLUCOSAMINE-CHONDROIT-VIT C-MN (GLUCOSAMINE 1500 COMPLEX) CAPS    Take 2 capsules by mouth daily.   HYDROCORTISONE (ANUSOL-HC) 2.5 % RECTAL CREAM    Place 1 application rectally 2 (two) times daily.   LORAZEPAM (ATIVAN) 0.5 MG TABLET    Take 1 tablet (0.5 mg total) by mouth at bedtime.   METOPROLOL TARTRATE (LOPRESSOR) 25 MG TABLET    TAKE 1 TABLET BY MOUTH TWICE DAILY   MULTIPLE VITAMINS-MINERALS (PRESERVISION AREDS 2) CAPS    Take 2 capsules by mouth daily.   PANTOPRAZOLE (PROTONIX) 20 MG TABLET    Take 1 tablet (20 mg total) by mouth daily.   SIMVASTATIN (ZOCOR) 20 MG TABLET    Take 1 tablet (20  mg total) by mouth daily.   TAMSULOSIN (FLOMAX) 0.4 MG CAPS CAPSULE    TAKE 1 CAPSULE(0.4 MG) BY MOUTH DAILY   VENLAFAXINE XR (EFFEXOR XR) 75 MG 24 HR CAPSULE    Take 1 capsule (75 mg total) by mouth daily.   VORTIOXETINE HBR 10 MG TABS    Take 1 tablet (10 mg total) by mouth daily.  Modified Medications   No medications on file  Discontinued Medications   No medications on file    Review of Systems  Constitutional: Negative for activity change, appetite change, fatigue and unexpected weight change.  Eyes: Negative.   Respiratory: Negative for shortness of breath.   Cardiovascular: Negative for chest pain.  Gastrointestinal: Negative for diarrhea and constipation.  Genitourinary: Negative for dysuria and difficulty urinating.       Getting up every 2 hours to urinate. Been going to urologist   Musculoskeletal: Negative for myalgias and arthralgias.       Tender in calves  Neurological: Negative for dizziness and weakness.  Psychiatric/Behavioral: Positive for sleep disturbance (increase in sleep). Negative for suicidal ideas, behavioral problems, confusion, self-injury and agitation. The patient is not nervous/anxious.  Depression    Filed Vitals:   02/12/16 1600  BP: 118/76  Pulse: 63  Temp: 97.9 F (36.6 C)  TempSrc: Oral  Height: 5' 10"  (1.778 m)  Weight: 181 lb (82.101 kg)  SpO2: 98%   Body mass index is 25.97 kg/(m^2). Filed Weights   02/12/16 1600  Weight: 181 lb (82.101 kg)     Physical Exam  Constitutional: He is oriented to person, place, and time. He appears well-developed and well-nourished. No distress.  History of falls  HENT:  Head: Normocephalic and atraumatic.  Eyes: Conjunctivae are normal. Pupils are equal, round, and reactive to light.  Neck: Normal range of motion. Neck supple.  Cardiovascular: Normal rate, regular rhythm and normal heart sounds.   Bilateral varicosities  Pulmonary/Chest: Effort normal and breath sounds normal.    Abdominal: Soft. Bowel sounds are normal.  Musculoskeletal: He exhibits edema (1-2+ bipedal).  Neurological: He is alert and oriented to person, place, and time.  Skin: Skin is warm and dry. He is not diaphoretic.  Tattoo on his left arm states "I love [place]". Below this is a statement of "live free or die". He then has vertically in capital letters [id number] 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 His behavior is normal.    Labs reviewed: Lab Summary Latest Ref Rng 12/10/2015 04/04/2015 12/03/2014  Hemoglobin 12.6 - 17.7 g/dL (None) 14.1 (None)  Hematocrit 37.5 - 51.0 % (None) 42.8 (None)  White count 3.4 - 10.8 x10E3/uL (None) 9.1 (None)  Platelet count 150 - 379 x10E3/uL (None) 165 (None)  Sodium 134 - 144 mmol/L 140 140 141  Potassium 3.5 - 5.2 mmol/L 4.8 4.3 4.2  Calcium 8.6 - 10.2 mg/dL 9.1 9.1 9.3  Phosphorus - (None) (None) (None)  Creatinine 0.76 - 1.27 mg/dL 1.23 1.18 1.38(H)  AST 0 - 40 IU/L 18 14 (None)  Alk Phos 39 - 117 IU/L 77 90 (None)  Bilirubin 0.0 - 1.2 mg/dL 0.3 0.3 (None)  Glucose 65 - 99 mg/dL 102(H) 78 106(H)  Cholesterol - (None) (None) (None)  HDL cholesterol >39 mg/dL 37(L) (None) (None)  Triglycerides 0 - 149 mg/dL 81 (None) (None)  LDL Direct - (None) (None) (None)  LDL Calc 0 - 99 mg/dL 70 (None) (None)  Total protein - (None) (None) (None)  Albumin 3.5 - 4.8 g/dL 4.1 4.0 (None)   Lab Results  Component Value Date   TSH 4.690* 12/10/2015   TSH 4.520* 04/23/2014   TSH 4.130 04/24/2013   Lab Results  Component Value Date   BUN 23 12/10/2015   BUN 18 04/04/2015   BUN 19 12/03/2014   Lab Results  Component Value Date   HGBA1C 5.9* 12/10/2015   HGBA1C 5.9* 12/04/2014   HGBA1C 5.8* 04/25/2014    Assessment/Plan  1. History of fall Etiology of falls is unclear. Discussed possible CT brain scan with the patient, but he doesn't see the need for this at present. No recent change in  medications.  2. Essential hypertension Well-controlled  3. Depression Continues on Effexor

## 2016-02-20 ENCOUNTER — Ambulatory Visit (INDEPENDENT_AMBULATORY_CARE_PROVIDER_SITE_OTHER): Payer: No Typology Code available for payment source | Admitting: Psychiatry

## 2016-02-20 VITALS — BP 122/74 | HR 69 | Ht 70.0 in | Wt 175.2 lb

## 2016-02-20 DIAGNOSIS — F3341 Major depressive disorder, recurrent, in partial remission: Secondary | ICD-10-CM

## 2016-02-20 NOTE — Progress Notes (Signed)
Patient ID: Christian Munoz, male   DOB: 06-20-1935, 81 y.o.   MRN: [id number]  Psychiatric Initial Adult Assessment   Patient Identification: Christian Munoz MRN:  [id number] Date of Evaluation:  02/20/2016 Referral Source: Dr. Sherrie Mustache Chief Complaint:   no motivation Visit Diagnosis: Major depression, chronic, mild Diagnosis:  Major depression, chronic, mild Patient Active Problem List   Diagnosis Date Noted  . Essential hypertension [I10] 12/04/2014  . BPH (benign prostatic hypertrophy) with urinary obstruction [N40.1] 12/04/2014  . Depression [F32.9] 12/04/2014  . Hyperlipidemia [E78.5] 01/06/2013  . Reflux esophagitis [K21.0]   . Osteoarthrosis, unspecified whether generalized or localized, unspecified site [M19.90]    History of Present Illness: Today the patient seems like he is baseline. The patient has a difficult time describing a depressed mood state. At times she describes himself like he is losing interest in some things. He still enjoys his 52-year-old granddaughter and his 15 year old grandchild. He still likes to read but it's less so. The patient doesn't join TV to some degree. Unfortunately hed side effects from Wellbutrin and that it caused insomnia and he said caused some neck pain. He stopped the Wellbutrin and the side effects went away. We changed him to Effexor 75 mg she's been on for 1 week. Patient says his appetite is good. He says his energy is adequate. Again he says he sleeping too much at this time. At first we also prescribed Ativan which he has stopped which is good. The patient says he can think and concentrate well he denies worthlessness and he denies suicidal ideation. This patient is a very poor historian and is unclear what treatment efforts have been used. He says he's been in therapy many times in the last time was over 10 years ago. The patient at times is circumstantial. He's hard to stay focused. He is best I can tell this primary care doctor  had changed him from Cymbalta which is been on for a long time because it produced oversedation. Now he reconsiders that maybe it wasn't oversedation and that in fact Cymbalta worked well for a long time. Nonetheless his doctor changed him to a new antidepressant which didn't seem to work I changed him to Wellbutrin which produce side effects and now is been on Effexor 1 week. The patient is not psychotic. He denies the use of alcohol or drugs. He is difficult to get clear information from. I'm not clear if he has major clinical depression. Associated Signs/Symptoms: Depression Symptoms:  fatigue, (Hypo) Manic Symptoms:   Anxiety Symptoms:   Psychotic Symptoms:   PTSD Symptoms:   Past Medical History:  Past Medical History  Diagnosis Date  . Syncope and collapse   . Lack of coordination   . Allergic rhinitis due to pollen   . Herpes genitalia   . Vitamin D deficiency   . Other and unspecified hyperlipidemia   . Depressive disorder, not elsewhere classified   . Coronary atherosclerosis of native coronary artery   . Reflux esophagitis   . Hypertrophy of prostate without urinary obstruction and other lower urinary tract symptoms (LUTS)   . Osteoarthrosis, unspecified whether generalized or localized, unspecified site   . Lumbago   . Other malaise and fatigue   . Urinary frequency   . Lack of coordination   . Coronary atherosclerosis of native coronary artery   . Hypertrophy of prostate without urinary obstruction and other lower urinary tract symptoms (LUTS)   . Osteoarthrosis, unspecified whether generalized or localized, unspecified site  Past Surgical History  Procedure Laterality Date  . Poly vocal cords  1985  . Skin cancer excision  2011  . Cardiac catheterization     Family History:  Family History  Problem Relation Age of Onset  . Heart disease Father   . Cancer Father   . Cancer Brother   . Cancer Brother    Social History:   Social History   Social History   . Marital Status: Married    Spouse Name: N/A  . Number of Children: N/A  . Years of Education: N/A   Social History Main Topics  . Smoking status: Light Tobacco Smoker    Types: Cigarettes  . Smokeless tobacco: Never Used     Comment: keep trying   . Alcohol Use: No  . Drug Use: No  . Sexual Activity: Not on file   Other Topics Concern  . Not on file   Social History Narrative   Additional Social History:   Musculoskeletal: Strength & Muscle Tone: within normal limits Gait & Station: normal Patient leans: N/A  Psychiatric Specialty Exam: HPI  ROS  Blood pressure 122/74, pulse 69, height '5\' 10"'$  (1.778 m), weight 175 lb 3.2 oz (79.47 kg).Body mass index is 25.14 kg/(m^2).  General Appearance: Casual  Eye Contact:  Good  Speech:  Clear and Coherent  Volume:  Normal  Mood:  NA  Affect:  Congruent  Thought Process:  Goal Directed  Orientation:  Full (Time, Place, and Person)  Thought Content:  WDL  Suicidal Thoughts:  No  Homicidal Thoughts:  No  Memory:  NA  Judgement:  Good  Insight:  Good  Psychomotor Activity:  Normal  Concentration:  Good  Recall:  [place] of Knowledge:Fair  Language: Good  Akathisia:  No  Handed:  Right  AIMS (if indicated):    Assets:  Desire for Improvement  ADL's:  Intact  Cognition: WNL  Sleep:     Is the patient at risk to self?  No. Has the patient been a risk to self in the past 6 months?  No. Has the patient been a risk to self within the distant past?  No. Is the patient a risk to others?  No. Has the patient been a risk to others in the past 6 months?  No. Has the patient been a risk to others within the distant past?  No.  Allergies:   Allergies  Allergen Reactions  . Chantix [Varenicline]     Makes patient suicidal  . Zoloft [Sertraline Hcl]     Makes patient suicidal   Current Medications: Current Outpatient Prescriptions  Medication Sig Dispense Refill  . acyclovir (ZOVIRAX) 800 MG tablet Take 800 mg by  mouth 2 (two) times daily as needed. Take one tablet once a day to help prevent flare up    . aspirin 81 MG tablet Take 81 mg by mouth daily. Take one tablet once a day    . calcium carbonate (OS-CAL) 600 MG TABS Take 600 mg by mouth 2 (two) times daily with a meal. Take one tablet once a day for calcium supplement    . fish oil-omega-3 fatty acids 1000 MG capsule Take 2 g by mouth daily. Take one tablet twice a day    . Glucosamine-Chondroit-Vit C-Mn (GLUCOSAMINE 1500 COMPLEX) CAPS Take 2 capsules by mouth daily.    . hydrocortisone (ANUSOL-HC) 2.5 % rectal cream Place 1 application rectally 2 (two) times daily. 30 g 0  . LORazepam (ATIVAN) 0.5 MG  tablet Take 1 tablet (0.5 mg total) by mouth at bedtime. 30 tablet 2  . metoprolol tartrate (LOPRESSOR) 25 MG tablet TAKE 1 TABLET BY MOUTH TWICE DAILY 180 tablet 3  . Multiple Vitamins-Minerals (PRESERVISION AREDS 2) CAPS Take 2 capsules by mouth daily.    . pantoprazole (PROTONIX) 20 MG tablet Take 1 tablet (20 mg total) by mouth daily. 90 tablet 3  . simvastatin (ZOCOR) 20 MG tablet Take 1 tablet (20 mg total) by mouth daily. 90 tablet 3  . tamsulosin (FLOMAX) 0.4 MG CAPS capsule TAKE 1 CAPSULE(0.4 MG) BY MOUTH DAILY 90 capsule 1  . venlafaxine XR (EFFEXOR XR) 75 MG 24 hr capsule Take 1 capsule (75 mg total) by mouth daily. 30 capsule 2  . Vortioxetine HBr 10 MG TABS Take 1 tablet (10 mg total) by mouth daily. 30 tablet 3   No current facility-administered medications for this visit.    Previous Psychotropic Medications: Yes   Substance Abuse History in the last 12 months:  No.  Consequences of Substance Abuse:   Medical Decision Making:  New problem, with additional work up planned  Treatment Plan Summary: At this time the patient will continue taking Effexor 75 mg X are. Noted is they no longer takes Ativan and he was taking melatonin for a while that the stop. Again I brought up the concept of being in psychotherapy but he is hesitant and  resistant. The patient return to see me in approximately 2 months and will continue this evaluation. I want to give Effexor at least some time to work if it's going to. It makes sense to be on Effexor since its internal mechanism drug and he does claim Cymbalta was helpful. He said Cymbalta oversedated so it'll be interesting to see what Effexor to [place]. At this time he says he is sleeping too much already. I will not make any changes. The patient denies chest pain or shortness of breath. He denies any neurological symptoms at this time.   Charlestine Night [place] D[place] 5/11/20171:54 PM

## 2016-02-28 DIAGNOSIS — Z9181 History of falling: Secondary | ICD-10-CM | POA: Insufficient documentation

## 2016-03-16 ENCOUNTER — Other Ambulatory Visit: Payer: Self-pay | Admitting: Nurse Practitioner

## 2016-03-30 ENCOUNTER — Encounter: Payer: Self-pay | Admitting: Nurse Practitioner

## 2016-03-30 ENCOUNTER — Ambulatory Visit (INDEPENDENT_AMBULATORY_CARE_PROVIDER_SITE_OTHER): Payer: Medicare Other | Admitting: Nurse Practitioner

## 2016-03-30 VITALS — BP 112/68 | HR 70 | Temp 97.7°F | Resp 17 | Ht 70.0 in | Wt 173.4 lb

## 2016-03-30 DIAGNOSIS — F329 Major depressive disorder, single episode, unspecified: Secondary | ICD-10-CM

## 2016-03-30 DIAGNOSIS — M791 Myalgia, unspecified site: Secondary | ICD-10-CM

## 2016-03-30 DIAGNOSIS — K649 Unspecified hemorrhoids: Secondary | ICD-10-CM | POA: Diagnosis not present

## 2016-03-30 DIAGNOSIS — F32A Depression, unspecified: Secondary | ICD-10-CM

## 2016-03-30 MED ORDER — ZOSTER VACCINE LIVE 19400 UNT/0.65ML ~~LOC~~ SUSR: 0.6500 mL | Freq: Once | SUBCUTANEOUS | Status: DC

## 2016-03-30 MED ORDER — LIDOCAINE 5 % EX PTCH: 1.0000 | MEDICATED_PATCH | CUTANEOUS | Status: DC

## 2016-03-30 MED ORDER — HYDROCORTISONE 2.5 % RE CREA: 1.0000 "application " | TOPICAL_CREAM | Freq: Two times a day (BID) | RECTAL | Status: DC

## 2016-03-30 NOTE — Patient Instructions (Addendum)
May use salonpas patch as needed for calf pain or Lidoderm patch (apply to affected area and remove after 12 hours- leave off for 12 hours)  Will refer to surgery at this time

## 2016-03-30 NOTE — Progress Notes (Signed)
Patient ID: Christian Munoz, male   DOB: 1935/07/25, 81 y.o.   MRN: [id number]    PCP: [name], NP  Advanced Directive information Does patient have an advance directive?: Yes, Type of Advance Directive: Living will, Does patient want to make changes to advanced directive?: No - Patient declined  Allergies  Allergen Reactions  . Chantix [Varenicline]     Makes patient suicidal  . Zoloft [Sertraline Hcl]     Makes patient suicidal    Chief Complaint  Patient presents with  . Medical Management of Chronic Issues    Bilateral leg pain since November. Not hurting today due to taking 2 Aleve per day x 1week.    . OTHER    Wants to discuss private matter with provider.      HPI: Patient is a 82 y.o. male seen in the office today due to leg pain. Left leg and foot. Taking aleve on a regular basis which has cleared it. Did not take it this morning but does not have pain. Also using OTC lidocaine cream which has helped but only for a short time.   Having anal leakage which has been ongoing also has hemorrhoids -- would like to see a specialist for this  Depression- ongoing, seeing psychiatrist for this. Currently on effexor-- unsure if he is seeing much benefit to this. No motivation. Does not enjoy things. More of a loner- so SI or HI but thinks about dying.    Review of Systems:  Review of Systems  Constitutional: Negative for activity change, appetite change, fatigue and unexpected weight change.  HENT: Negative for congestion and hearing loss.   Eyes: Negative.   Respiratory: Negative for cough and shortness of breath.   Cardiovascular: Negative for chest pain, palpitations and leg swelling.  Gastrointestinal: Negative for abdominal pain, diarrhea and constipation.       Clear discharge from rectum hemorrhoids   Genitourinary: Negative for dysuria and difficulty urinating.  Musculoskeletal: Negative for myalgias and arthralgias.  Skin: Negative for color change and  wound.  Neurological: Negative for dizziness and weakness.  Psychiatric/Behavioral: Positive for sleep disturbance. Negative for behavioral problems, confusion and agitation.       Depression    Past Medical History  Diagnosis Date  . Syncope and collapse   . Lack of coordination   . Allergic rhinitis due to pollen   . Herpes genitalia   . Vitamin D deficiency   . Other and unspecified hyperlipidemia   . Depressive disorder, not elsewhere classified   . Coronary atherosclerosis of native coronary artery   . Reflux esophagitis   . Hypertrophy of prostate without urinary obstruction and other lower urinary tract symptoms (LUTS)   . Osteoarthrosis, unspecified whether generalized or localized, unspecified site   . Lumbago   . Other malaise and fatigue   . Urinary frequency   . Lack of coordination   . Coronary atherosclerosis of native coronary artery   . Hypertrophy of prostate without urinary obstruction and other lower urinary tract symptoms (LUTS)   . Osteoarthrosis, unspecified whether generalized or localized, unspecified site    Past Surgical History  Procedure Laterality Date  . Poly vocal cords  1985  . Skin cancer excision  2011  . Cardiac catheterization     Social History:   reports that he has been smoking Cigarettes.  He has never used smokeless tobacco. He reports that he does not drink alcohol or use illicit drugs.  Family History  Problem Relation Age  of Onset  . Heart disease Father   . Cancer Father   . Cancer Brother   . Cancer Brother     Medications: Patient's Medications  New Prescriptions   No medications on file  Previous Medications   ASPIRIN 81 MG TABLET    Take 81 mg by mouth daily. Take one tablet once a day   CALCIUM CARBONATE (OS-CAL) 600 MG TABS    Take 600 mg by mouth 2 (two) times daily with a meal. Take one tablet once a day for calcium supplement   FISH OIL-OMEGA-3 FATTY ACIDS 1000 MG CAPSULE    Take 2 g by mouth daily. Take one  tablet twice a day   GLUCOSAMINE-CHONDROIT-VIT C-MN (GLUCOSAMINE 1500 COMPLEX) CAPS    Take 2 capsules by mouth daily.   HYDROCORTISONE (ANUSOL-HC) 2.5 % RECTAL CREAM    Place 1 application rectally 2 (two) times daily.   METOPROLOL TARTRATE (LOPRESSOR) 25 MG TABLET    TAKE 1 TABLET BY MOUTH TWICE DAILY   MULTIPLE VITAMINS-MINERALS (PRESERVISION AREDS 2) CAPS    Take 2 capsules by mouth daily.   PANTOPRAZOLE (PROTONIX) 20 MG TABLET    Take 1 tablet (20 mg total) by mouth daily.   SIMVASTATIN (ZOCOR) 20 MG TABLET    Take 1 tablet (20 mg total) by mouth daily.   TAMSULOSIN (FLOMAX) 0.4 MG CAPS CAPSULE    TAKE 1 CAPSULE(0.4 MG) BY MOUTH DAILY   TRINTELLIX 10 MG TABS    TK 1 T PO QD   VENLAFAXINE XR (EFFEXOR XR) 75 MG 24 HR CAPSULE    Take 1 capsule (75 mg total) by mouth daily.  Modified Medications   No medications on file  Discontinued Medications   ACYCLOVIR (ZOVIRAX) 800 MG TABLET    Take 800 mg by mouth 2 (two) times daily as needed. Take one tablet once a day to help prevent flare up   LORAZEPAM (ATIVAN) 0.5 MG TABLET    TK 1 T PO QHS PRN     Physical Exam:  Filed Vitals:   03/30/16 1506  BP: 112/68  Pulse: 70  Temp: 97.7 F (36.5 C)  TempSrc: Oral  Resp: 17  Height: '5\' 10"'$  (1.778 m)  Weight: 173 lb 6.4 oz (78.654 kg)  SpO2: 92%   Body mass index is 24.88 kg/(m^2).  Physical Exam  Constitutional: He is oriented to person, place, and time. He appears well-developed and well-nourished. No distress.  HENT:  Head: Normocephalic and atraumatic.  Eyes: Conjunctivae are normal. Pupils are equal, round, and reactive to light.  Neck: Normal range of motion. Neck supple.  Cardiovascular: Normal rate, regular rhythm and normal heart sounds.   Bilateral varicosities  Pulmonary/Chest: Effort normal and breath sounds normal.  Abdominal: Soft. Bowel sounds are normal.  Musculoskeletal: He exhibits no edema.  Neurological: He is alert and oriented to person, place, and time.  Skin:  Skin is warm and dry. He is not diaphoretic.  Tattoo on his left arm states "I love [place]". Below this is a statement of "live free or die". He then has vertically in capital letters [employer] 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 His behavior is normal.    Labs reviewed: Basic Metabolic Panel:  Recent Labs  04/04/15 1451 12/10/15 1017  NA 140 140  K 4.3 4.8  CL 102 101  CO2 24 21  GLUCOSE 78 102*  BUN 18 23  CREATININE 1.18 1.23  CALCIUM 9.1 9.1  TSH  --  4.690*   Liver Function Tests:  Recent Labs  04/04/15 1451 12/10/15 1017  AST 14 18  ALT 14 14  ALKPHOS 90 77  BILITOT 0.3 0.3  PROT 6.3 6.3  ALBUMIN 4.0 4.1   No results for input(s): LIPASE, AMYLASE in the last 8760 hours. No results for input(s): AMMONIA in the last 8760 hours. CBC:  Recent Labs  04/04/15 1451  WBC 9.1  NEUTROABS 4.4  HCT 42.8  MCV 88  PLT 165   Lipid Panel:  Recent Labs  12/10/15 1017  CHOL 123  HDL 37*  LDLCALC 70  TRIG 81  CHOLHDL 3.3   TSH:  Recent Labs  12/10/15 1017  TSH 4.690*   A1C: Lab Results  Component Value Date   HGBA1C 5.9* 12/10/2015     Assessment/Plan 1. Hemorrhoids, unspecified hemorrhoid type -would like to see specialist for hemorrhoids to help with leakage - hydrocortisone (ANUSOL-HC) 2.5 % rectal cream; Place 1 application rectally 2 (two) times daily.  Dispense: 30 g; Refill: 0 - Ambulatory referral to General Surgery for evaluation  2. Muscle ache -has been using aleve for last week with good results. To stop this at this time and may use muscle rubs OTC or salon pas patch, if this is not effective may use  - lidocaine (LIDODERM) 5 %; Place 1 patch onto the skin daily. Remove & Discard patch within 12 hours  Dispense: 30 patch; Refill: 0  3. Depression -ongoing depression, following with psychiatrist, no SI or HI at this time.  To keep routine follow up  [place]. Harle Battiest  [facility] [phone] 8 am - 5 pm) [phone] (after hours)

## 2016-04-08 ENCOUNTER — Ambulatory Visit (INDEPENDENT_AMBULATORY_CARE_PROVIDER_SITE_OTHER): Payer: No Typology Code available for payment source | Admitting: Psychiatry

## 2016-04-08 ENCOUNTER — Encounter (HOSPITAL_COMMUNITY): Payer: Self-pay | Admitting: Psychiatry

## 2016-04-08 VITALS — BP 118/70 | HR 73 | Ht 70.0 in | Wt 171.4 lb

## 2016-04-08 DIAGNOSIS — F339 Major depressive disorder, recurrent, unspecified: Secondary | ICD-10-CM

## 2016-04-08 MED ORDER — DOXEPIN HCL 25 MG PO CAPS: 25.0000 mg | ORAL_CAPSULE | Freq: Every day | ORAL | Status: DC

## 2016-04-08 NOTE — Progress Notes (Signed)
Patient ID: Christian Munoz, male   DOB: September 17, 1935, 81 y.o.   MRN: [id number] Patient ID: Christian Munoz, male   DOB: Oct 16, 1934, 81 y.o.   MRN: [id number]  Psychiatric Initial Adult Assessment   Patient Identification: Christian Munoz MRN:  [id number] Date of Evaluation:  04/08/2016 Referral Source: Dr. Sherrie Mustache Chief Complaint:   no motivation Visit Diagnosis: Major depression, chronic, mild Diagnosis:  Major depression, chronic, mild Patient Active Problem List   Diagnosis Date Noted  . History of fall [Z91.81] 02/28/2016  . Essential hypertension [I10] 12/04/2014  . BPH (benign prostatic hypertrophy) with urinary obstruction [N40.1] 12/04/2014  . Depression [F32.9] 12/04/2014  . Hyperlipidemia [E78.5] 01/06/2013  . Reflux esophagitis [K21.0]   . Osteoarthrosis, unspecified whether generalized or localized, unspecified site [M19.90]    History of Present Illness: Today the patient arrived 10 minutes late for a 15 minute visit. There is confusion about why he was here. He said he was called to take a cancellation position but he didn't really want to be seen earlier. I'm not clear why we called and he was is confused as I was. Nonetheless he said it made good use of his time by telling me that he was sleeping poorly. Today I offered him a sleeping aid and doxepin. There was little time to talk about much more 5 interestingly he now knowledge is that he would like to have a therapist. I think this is great. The patient denies persistent daily depression. He is denies any other significant physical complaints at this time. Is a very poor historian and is difficult to follow. Depression Symptoms:  fatigue, (Hypo) Manic Symptoms:   Anxiety Symptoms:   Psychotic Symptoms:   PTSD Symptoms:   Past Medical History:  Past Medical History  Diagnosis Date  . Syncope and collapse   . Lack of coordination   . Allergic rhinitis due to pollen   . Herpes genitalia   . Vitamin D  deficiency   . Other and unspecified hyperlipidemia   . Depressive disorder, not elsewhere classified   . Coronary atherosclerosis of native coronary artery   . Reflux esophagitis   . Hypertrophy of prostate without urinary obstruction and other lower urinary tract symptoms (LUTS)   . Osteoarthrosis, unspecified whether generalized or localized, unspecified site   . Lumbago   . Other malaise and fatigue   . Urinary frequency   . Lack of coordination   . Coronary atherosclerosis of native coronary artery   . Hypertrophy of prostate without urinary obstruction and other lower urinary tract symptoms (LUTS)   . Osteoarthrosis, unspecified whether generalized or localized, unspecified site     Past Surgical History  Procedure Laterality Date  . Poly vocal cords  1985  . Skin cancer excision  2011  . Cardiac catheterization     Family History:  Family History  Problem Relation Age of Onset  . Heart disease Father   . Cancer Father   . Cancer Brother   . Cancer Brother    Social History:   Social History   Social History  . Marital Status: Married    Spouse Name: N/A  . Number of Children: N/A  . Years of Education: N/A   Social History Main Topics  . Smoking status: Light Tobacco Smoker    Types: Cigarettes  . Smokeless tobacco: Never Used     Comment: keep trying   . Alcohol Use: No  . Drug Use: No  . Sexual Activity: Not Asked  Other Topics Concern  . None   Social History Narrative   Additional Social History:   Musculoskeletal: Strength & Muscle Tone: within normal limits Gait & Station: normal Patient leans: N/A  Psychiatric Specialty Exam: HPI  ROS  Blood pressure 118/70, pulse 73, height '5\' 10"'$  (1.778 m), weight 171 lb 6.4 oz (77.747 kg).Body mass index is 24.59 kg/(m^2).  General Appearance: Casual  Eye Contact:  Good  Speech:  Clear and Coherent  Volume:  Normal  Mood:  NA  Affect:  Congruent  Thought Process:  Goal Directed  Orientation:   Full (Time, Place, and Person)  Thought Content:  WDL  Suicidal Thoughts:  No  Homicidal Thoughts:  No  Memory:  NA  Judgement:  Good  Insight:  Good  Psychomotor Activity:  Normal  Concentration:  Good  Recall:  [place] of Knowledge:Fair  Language: Good  Akathisia:  No  Handed:  Right  AIMS (if indicated):    Assets:  Desire for Improvement  ADL's:  Intact  Cognition: WNL  Sleep:     Is the patient at risk to self?  No. Has the patient been a risk to self in the past 6 months?  No. Has the patient been a risk to self within the distant past?  No. Is the patient a risk to others?  No. Has the patient been a risk to others in the past 6 months?  No. Has the patient been a risk to others within the distant past?  No.  Allergies:   Allergies  Allergen Reactions  . Chantix [Varenicline]     Makes patient suicidal  . Zoloft [Sertraline Hcl]     Makes patient suicidal   Current Medications: Current Outpatient Prescriptions  Medication Sig Dispense Refill  . aspirin 81 MG tablet Take 81 mg by mouth daily. Take one tablet once a day    . calcium carbonate (OS-CAL) 600 MG TABS Take 600 mg by mouth 2 (two) times daily with a meal. Take one tablet once a day for calcium supplement    . doxepin (SINEQUAN) 25 MG capsule Take 1 capsule (25 mg total) by mouth at bedtime. 30 capsule 4  . fish oil-omega-3 fatty acids 1000 MG capsule Take 2 g by mouth daily. Take one tablet twice a day    . Glucosamine-Chondroit-Vit C-Mn (GLUCOSAMINE 1500 COMPLEX) CAPS Take 2 capsules by mouth daily.    . hydrocortisone (ANUSOL-HC) 2.5 % rectal cream Place 1 application rectally 2 (two) times daily. 30 g 0  . lidocaine (LIDODERM) 5 % Place 1 patch onto the skin daily. Remove & Discard patch within 12 hours 30 patch 0  . metoprolol tartrate (LOPRESSOR) 25 MG tablet TAKE 1 TABLET BY MOUTH TWICE DAILY 180 tablet 3  . Multiple Vitamins-Minerals (PRESERVISION AREDS 2) CAPS Take 2 capsules by mouth daily.     . pantoprazole (PROTONIX) 20 MG tablet Take 1 tablet (20 mg total) by mouth daily. 90 tablet 3  . simvastatin (ZOCOR) 20 MG tablet Take 1 tablet (20 mg total) by mouth daily. 90 tablet 3  . tamsulosin (FLOMAX) 0.4 MG CAPS capsule TAKE 1 CAPSULE(0.4 MG) BY MOUTH DAILY 90 capsule 0  . TRINTELLIX 10 MG TABS TK 1 T PO QD  3  . venlafaxine XR (EFFEXOR XR) 75 MG 24 hr capsule Take 1 capsule (75 mg total) by mouth daily. 30 capsule 2  . Zoster Vaccine Live, PF, (ZOSTAVAX) 39767 UNT/0.65ML injection Inject 19,400 Units into the skin once.  1 each 0   No current facility-administered medications for this visit.    Previous Psychotropic Medications: Yes   Substance Abuse History in the last 12 months:  No.  Consequences of Substance Abuse:   Medical Decision Making:  New problem, with additional work up planned  Treatment Plan Summary: At this time the patient begin doxepin 25 mg for sleep. I will avoid any benzodiazepines in this individual I will assess patient to return to see me in 3 months we'll go ahead and sign and with a therapist. Charlestine Night Erhard Senske 6/28/20172:25 PM

## 2016-04-09 ENCOUNTER — Telehealth (HOSPITAL_COMMUNITY): Payer: Self-pay

## 2016-04-16 ENCOUNTER — Telehealth (HOSPITAL_COMMUNITY): Payer: Self-pay | Admitting: *Deleted

## 2016-04-16 NOTE — Telephone Encounter (Signed)
Prior authorization for Doxepin received. Submitted online with cover my meds. Awaiting decision to be faxed.

## 2016-04-20 ENCOUNTER — Other Ambulatory Visit (HOSPITAL_COMMUNITY): Payer: Self-pay | Admitting: Psychiatry

## 2016-04-20 ENCOUNTER — Ambulatory Visit (INDEPENDENT_AMBULATORY_CARE_PROVIDER_SITE_OTHER): Payer: No Typology Code available for payment source | Admitting: Licensed Clinical Social Worker

## 2016-04-20 ENCOUNTER — Other Ambulatory Visit (HOSPITAL_COMMUNITY): Payer: Self-pay

## 2016-04-20 DIAGNOSIS — F329 Major depressive disorder, single episode, unspecified: Secondary | ICD-10-CM | POA: Diagnosis not present

## 2016-04-20 MED ORDER — VENLAFAXINE HCL ER 75 MG PO CP24: 75.0000 mg | ORAL_CAPSULE | Freq: Every day | ORAL | Status: DC

## 2016-04-20 NOTE — Progress Notes (Signed)
   THERAPIST PROGRESS NOTE  Session Time: 1:20-2:00pm  Participation Level: Active  Behavioral Response: Fairly GroomedAlertDepressed somewhat  Type of Therapy: Individual Therapy  Treatment Goals addressed: Coping  Interventions: CBT and Supportive  Summary: Christian Munoz is a 81 y.o. male who presents with depression with lack of motivation and sleep.  Suicidal/Homicidal: Nowithout intent/plan  Therapist Response: Today was pt'Munoz first day in therapy. He was referred by his psychiatrist Dr. Casimiro Needle. This initial appt was an introductory appt, getting to know about the pt and what he wants to work on in therapy. Pt has kept a sleep log and it appears he only sleeps a few hours a night and doesn't go to sleep until 4am or later. Reviewed medications with pt and asked CMA Ragen to explain meds to pt, when to take them and the effects of the medications. Pt is concerned about his lack of motivation. He shared he has been to his PCP and had blood work done, has tried many different anti-depressants, and many different sleep aids. Pt has few if any friends. Lives in [place] because his son and 2 grandaughters live here. Now he rarely gets off the couch. Challenged pt to begin walking in his swimming pool and exercise more. Will also talk with Dr. Casimiro Needle to discuss his medications before his next appt.  Plan: Return again in 1 weeks, requested by pt.   Diagnosis: Axis I: Major Depressive Disorder    Axis II: No DX    Christian Munoz, Licensed Cli 5/87/2761

## 2016-04-23 ENCOUNTER — Telehealth (HOSPITAL_COMMUNITY): Payer: Self-pay | Admitting: Licensed Clinical Social Worker

## 2016-04-26 ENCOUNTER — Other Ambulatory Visit: Payer: Self-pay | Admitting: Nurse Practitioner

## 2016-04-30 ENCOUNTER — Ambulatory Visit (INDEPENDENT_AMBULATORY_CARE_PROVIDER_SITE_OTHER): Payer: No Typology Code available for payment source | Admitting: Licensed Clinical Social Worker

## 2016-04-30 DIAGNOSIS — F339 Major depressive disorder, recurrent, unspecified: Secondary | ICD-10-CM

## 2016-04-30 NOTE — Progress Notes (Signed)
   THERAPIST PROGRESS NOTE  Session Time: 10:10-11:00am  Participation Level: Active  Behavioral Response: CasualAlert  Type of Therapy: Individual Therapy  Treatment Goals addressed: Coping  Interventions: Supportive and Reframing  Summary: Christian Munoz is a 81 y.o. male who presents with depression.  Suicidal/Homicidal: Nowithout intent/plan  Therapist Response: Met with pt to discuss continued symptoms: lack of motivation, no energy, no stamina, erratic sleep patterns. Pt began taking his Effexor and Doxepin as prescribed. It has helped miminally but will continue to take them for now. Pt's symptoms can be related to depression but discussed other options. Together we developed a plan for the next week which will include medication, bedtime, alarm clock awakening at specific time, 3 meals per day exercise 3x per week. PT was in agreement to this. The schedule did not work on any social activities. Discussed a call to his NP to discuss symptoms and any blood work previously done. Pt has lost interest in previous enjoyable activities. Reading, writing, going to bookstores, seeing his granddaughter, going to the movies. Asked pt if he wants to live. He reported "I really don't want to die, not afraid of dying like I used to be." He re;ports no thoughts or plans for self-harm. Pt benefited by intervention.  Plan: Return again in 1` week with CBT therapy  Diagnosis: Axis I: Major depressive disorder chronic    Axis II: No DX    Xavier Munger S, Licensed Cli 3/33/8329

## 2016-05-07 ENCOUNTER — Ambulatory Visit (INDEPENDENT_AMBULATORY_CARE_PROVIDER_SITE_OTHER): Payer: No Typology Code available for payment source | Admitting: Licensed Clinical Social Worker

## 2016-05-07 DIAGNOSIS — F339 Major depressive disorder, recurrent, unspecified: Secondary | ICD-10-CM | POA: Diagnosis not present

## 2016-05-07 NOTE — Progress Notes (Signed)
THERAPIST PROGRESS NOTE  Session Time: 10:10-11:00am  Participation Level: Active  Behavioral Response: CasualAlert  Type of Therapy: Individual Therapy  Treatment Goals addressed: Coping  Interventions: Supportive and Reframing  Summary: Christian Munoz is a 81 y.o. male who presents with depression.  Suicidal/Homicidal: Nowithout intent/plan  Therapist Response: Met with pt to discuss continued symptoms: lack of motivation, no energy, no stamina, erratic sleep patterns. Pt is taking his Effexor and Doxepin as prescribed, but we discussed talking to Dr. Mamie Nick about increasing his Doxepin. Pt signed a release to talk with his NP about his symptoms. Will contact her. Pt brought in a ledger showing his weekly plan. 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 Pt presented more upbeat. Pt will continue keeping track of his daily activities. .  Pt does not see the increase in his activities. He said he felt hopeless this week. When asked about suicidality, he responded he wants to live. Will continue to see pt weekly while working on changing behaviors.  Plan: Return again in 1` week with CBT therapy  Diagnosis:      Axis I: Major depressive disorder chronic   Axis II: No DX    Charmayne Odell S, Licensed Cli

## 2016-05-13 ENCOUNTER — Ambulatory Visit (HOSPITAL_COMMUNITY): Payer: Self-pay | Admitting: Psychiatry

## 2016-05-14 ENCOUNTER — Ambulatory Visit (INDEPENDENT_AMBULATORY_CARE_PROVIDER_SITE_OTHER): Payer: No Typology Code available for payment source | Admitting: Licensed Clinical Social Worker

## 2016-05-14 ENCOUNTER — Telehealth: Payer: Self-pay

## 2016-05-14 ENCOUNTER — Telehealth (HOSPITAL_COMMUNITY): Payer: Self-pay | Admitting: Licensed Clinical Social Worker

## 2016-05-14 ENCOUNTER — Encounter (HOSPITAL_COMMUNITY): Payer: Self-pay | Admitting: Licensed Clinical Social Worker

## 2016-05-14 DIAGNOSIS — F339 Major depressive disorder, recurrent, unspecified: Secondary | ICD-10-CM | POA: Diagnosis not present

## 2016-05-14 NOTE — Progress Notes (Signed)
Patient ID: Christian Munoz, male   DOB: 30-Nov-1934, 81 y.o.   MRN: [id number]  NP Sherrie Mustache returned my call in reference to the pt. She reports he has a hx of lack of motivation and depression. She reviewed his medical information with me and she saw no red flags for this pt's physical health. Discussed what pt is currently working on with daily activities planning. NP Eubanks reviewed his blood work and found all WNL.

## 2016-05-14 NOTE — Progress Notes (Signed)
   THERAPIST PROGRESS NOTE  Session Time: 11:10-12:00pm  Participation Level: Active  Behavioral Response: CasualAlertDepressed  Type of Therapy: Individual Therapy  Treatment Goals addressed: Coping  Interventions: CBT and Supportive  Summary: Christian Munoz is a 80 y.o. male who presents with depressive symptoms. Pt brought in his diary of activities for the week. He is still not sleeping regularly, is eating 2 meals per day now. Taking his meds as prescribed. 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 He finds little pleasure in his life, no desire, no motivation. Pt did go to the coffee shop downtown but had no personal contact with anyone. Pt completed PHQ-9 and scored 14, last week it was 14. Pt will continue to complete his daily diary of activities. Pt had planned a trip to [place] with a friend but just didn't feel like going so canceled the trip. I have left a msg for his NP [name] and will speak with Dr. Christie Beckers about his medications.  Suicidal/Homicidal: Nowithout intent/plan  Therapist Response: Assessed pt's current functioning and reviewed progress. Assisted pt processing issues with daily activity planning, his family and management of depressive symptoms.  Plan: Return again in 1 weeks with CBT based therapy  Diagnosis: Axis I: Major Depressive Disorder    Axis II: No DX    Kaja Jackowski S, LCAS-A 05/14/2016

## 2016-05-14 NOTE — Telephone Encounter (Signed)
done

## 2016-05-14 NOTE — Telephone Encounter (Signed)
Beth from [employer] call to discuss patient's current status with provider. Patient has a lack of motivation, inactive, eating 1 meal daily, and erratic sleep patterns. Eustaquio Maize is concerned if patients symptoms are related to a medical issues vs depression. Please call to further discuss  Last OV  03/30/16, Pending OV 07/09/16

## 2016-05-20 ENCOUNTER — Other Ambulatory Visit (HOSPITAL_COMMUNITY): Payer: Self-pay

## 2016-05-20 MED ORDER — DOXEPIN HCL 25 MG PO CAPS: 50.0000 mg | ORAL_CAPSULE | Freq: Every day | ORAL | 5 refills | Status: DC

## 2016-05-21 ENCOUNTER — Encounter (HOSPITAL_COMMUNITY): Payer: Self-pay | Admitting: Licensed Clinical Social Worker

## 2016-05-21 ENCOUNTER — Ambulatory Visit (INDEPENDENT_AMBULATORY_CARE_PROVIDER_SITE_OTHER): Payer: No Typology Code available for payment source | Admitting: Licensed Clinical Social Worker

## 2016-05-21 DIAGNOSIS — F339 Major depressive disorder, recurrent, unspecified: Secondary | ICD-10-CM

## 2016-05-21 NOTE — Progress Notes (Signed)
   THERAPIST PROGRESS NOTE  Session Time: 1:15-2:00pm  Participation Level: Active  Behavioral Response: CasualAlertEuthymic  Type of Therapy: Individual Therapy  Treatment Goals addressed: Coping  Interventions: Strength-based and Supportive  Summary: Christian Munoz is a 81 y.o. male who presents with depressive symptoms. Reviewed pt's diary of self-care. He is eating 2 meals per day, went to the gym 4x this week, tutored neighbor daily, went to coffee shop 1x for socialization. Pt reports he still has erratic sleep patterns. Asked pt what makes you happy. Pt was thoughtful in his answer and said "my work." Pt is long retired as a Network engineer. Suggested to pt to go to [employer] and [place] [employer] for volunteer opportunities. Pt agreed to continue to work on his dairy of self care.  Suicidal/Homicidal: Nowithout intent/plan  Therapist Response: Assessed pt's current functioning and reviewed progress. Assisted pt processing issues with sleep patterns, volunteer oportunities and the management of his stressors.   Plan: Return again in 1 weeks using CGT-based therapy  Diagnosis: Axis I: F33.9        [place], LCAS-A 05/21/2016

## 2016-05-21 NOTE — Telephone Encounter (Signed)
Left msg with CNAabout pt's medical issues vs psychiatric issues.    By Jenkins Rouge, LCAS-A

## 2016-06-04 ENCOUNTER — Ambulatory Visit (INDEPENDENT_AMBULATORY_CARE_PROVIDER_SITE_OTHER): Payer: No Typology Code available for payment source | Admitting: Licensed Clinical Social Worker

## 2016-06-04 ENCOUNTER — Encounter (HOSPITAL_COMMUNITY): Payer: Self-pay | Admitting: Licensed Clinical Social Worker

## 2016-06-04 DIAGNOSIS — F339 Major depressive disorder, recurrent, unspecified: Secondary | ICD-10-CM

## 2016-06-04 NOTE — Progress Notes (Signed)
   THERAPIST PROGRESS NOTE  Session Time: 1:15-2:00pm  Participation Level: Active  Behavioral Response: CasualAlert  Type of Therapy: Individual Therapy  Treatment Goals addressed: Coping  Interventions: Supportive  Summary: Christian Munoz is a 81 y.o. male who presents with chronic depression. Pt brought in his 2 week diary of self-care. Pt still is not sleeping well and when he does sleep he goes to sleep at 4am. Pt did see his grandchildren, whio was excited to see.Marland Kitchen He has had little contact with them but they came over to his pool before school starts.Pt has been sick this week with fever, chills but is feeling better. Pt had some thoughts of death during his illness. Pt reports he is not having suicidal thoughts or plans. Discussed with pt about what he wants to do with the rest of his life. Pt is depressed because he is always tired and has no motivation to do anything. Suggested to pt to keep up his self-care and continue to keep his diary.  Suicidal/Homicidal: Nowithout intent/plan  Therapist Response: Assessed pt's current functioning and reviewed progress. Assisted pt processing issues with motivation, grandchildren and process for management of stressors.  Plan: Return again in 2 weeks.  Diagnosis: Axis I: Major depression recurrent, chronic        Francee Setzer S, LCAS-A 06/04/2016

## 2016-06-09 ENCOUNTER — Ambulatory Visit: Payer: Medicare Other | Admitting: Nurse Practitioner

## 2016-06-10 ENCOUNTER — Encounter (HOSPITAL_COMMUNITY): Payer: Self-pay | Admitting: Psychiatry

## 2016-06-10 ENCOUNTER — Ambulatory Visit (INDEPENDENT_AMBULATORY_CARE_PROVIDER_SITE_OTHER): Payer: No Typology Code available for payment source | Admitting: Psychiatry

## 2016-06-10 ENCOUNTER — Other Ambulatory Visit (HOSPITAL_COMMUNITY): Payer: Self-pay | Admitting: Psychiatry

## 2016-06-10 DIAGNOSIS — F329 Major depressive disorder, single episode, unspecified: Secondary | ICD-10-CM | POA: Diagnosis not present

## 2016-06-10 MED ORDER — BUPROPION HCL ER (XL) 150 MG PO TB24: 150.0000 mg | ORAL_TABLET | ORAL | 2 refills | Status: DC

## 2016-06-10 MED ORDER — VENLAFAXINE HCL ER 37.5 MG PO CP24: ORAL_CAPSULE | ORAL | 1 refills | Status: DC

## 2016-06-10 NOTE — Progress Notes (Signed)
Patient ID: Christian Munoz, male   DOB: Oct 26, 1934, 80 y.o.   MRN: [id number] Patient ID: Christian Munoz, male   DOB: 1935/09/07, 81 y.o.   MRN: [id number]  Psychiatric Initial Adult Assessment   Patient Identification: Christian Munoz MRN:  [id number] Date of Evaluation:  06/10/2016 Referral Source: Dr. Sherrie Mustache Chief Complaint:   no motivation Visit Diagnosis: Major depression, chronic, mild Diagnosis:  Major depression, chronic, mild Patient Active Problem List   Diagnosis Date Noted  . History of fall [Z91.81] 02/28/2016  . Essential hypertension [I10] 12/04/2014  . BPH (benign prostatic hypertrophy) with urinary obstruction [N40.1] 12/04/2014  . Depression [F32.9] 12/04/2014  . Hyperlipidemia [E78.5] 01/06/2013  . Reflux esophagitis [K21.0]   . Osteoarthrosis, unspecified whether generalized or localized, unspecified site [M19.90]    History of Present Illness Today the patient was seen as he just recently got his jeans site psychotropic report. It demonstrated that Effexor would be cheaper for him in the serum levels would be too high and average dose. Presently he is taking 75 mg and he feels oversedated. Also initiated this may produce side effects in this elderly man. Therefore we will discontinue his Effexor by reducing it to 37.5 mg for 2 weeks and then stop it. We discussed other antidepressants any claims is been well within the past which she thought was helpful. The patient says that he thinks the doxepin also was too strong and one list in fact it is to difficult to metabolize in this individual. The patient is eating well. He denies daily depression. He's looking for to change his care to a psychiatric setting is close to his home. (Hypo) Manic Symptoms:   Anxiety Symptoms:   Psychotic Symptoms:   PTSD Symptoms:   Past Medical History:  Past Medical History:  Diagnosis Date  . Allergic rhinitis due to pollen   . Coronary atherosclerosis of native  coronary artery   . Coronary atherosclerosis of native coronary artery   . Depressive disorder, not elsewhere classified   . Herpes genitalia   . Hypertrophy of prostate without urinary obstruction and other lower urinary tract symptoms (LUTS)   . Hypertrophy of prostate without urinary obstruction and other lower urinary tract symptoms (LUTS)   . Lack of coordination   . Lack of coordination   . Lumbago   . Osteoarthrosis, unspecified whether generalized or localized, unspecified site   . Osteoarthrosis, unspecified whether generalized or localized, unspecified site   . Other and unspecified hyperlipidemia   . Other malaise and fatigue   . Reflux esophagitis   . Syncope and collapse   . Urinary frequency   . Vitamin D deficiency     Past Surgical History:  Procedure Laterality Date  . CARDIAC CATHETERIZATION    . poly vocal cords  1985  . SKIN CANCER EXCISION  2011   Family History:  Family History  Problem Relation Age of Onset  . Heart disease Father   . Cancer Father   . Cancer Brother   . Cancer Brother    Social History:   Social History   Social History  . Marital status: Married    Spouse name: N/A  . Number of children: N/A  . Years of education: N/A   Social History Main Topics  . Smoking status: Light Tobacco Smoker    Types: Cigarettes  . Smokeless tobacco: Never Used     Comment: keep trying   . Alcohol use No  . Drug use: No  . Sexual activity:  Not Asked   Other Topics Concern  . None   Social History Narrative  . None   Additional Social History:   Musculoskeletal: Strength & Muscle Tone: within normal limits Gait & Station: normal Patient leans: N/A  Psychiatric Specialty Exam: HPI  ROS  Blood pressure 110/76, pulse 70, height '5\' 10"'$  (1.778 m), weight 171 lb (77.6 kg).Body mass index is 24.54 kg/m.  General Appearance: Casual  Eye Contact:  Good  Speech:  Clear and Coherent  Volume:  Normal  Mood:  NA  Affect:  Congruent   Thought Process:  Goal Directed  Orientation:  Full (Time, Place, and Person)  Thought Content:  WDL  Suicidal Thoughts:  No  Homicidal Thoughts:  No  Memory:  NA  Judgement:  Good  Insight:  Good  Psychomotor Activity:  Normal  Concentration:  Good  Recall:  [place] of Knowledge:Fair  Language: Good  Akathisia:  No  Handed:  Right  AIMS (if indicated):    Assets:  Desire for Improvement  ADL's:  Intact  Cognition: WNL  Sleep:     Is the patient at risk to self?  No. Has the patient been a risk to self in the past 6 months?  No. Has the patient been a risk to self within the distant past?  No. Is the patient a risk to others?  No. Has the patient been a risk to others in the past 6 months?  No. Has the patient been a risk to others within the distant past?  No.  Allergies:   Allergies  Allergen Reactions  . Chantix [Varenicline]     Makes patient suicidal  . Zoloft [Sertraline Hcl]     Makes patient suicidal   Current Medications: Current Outpatient Prescriptions  Medication Sig Dispense Refill  . aspirin 81 MG tablet Take 81 mg by mouth daily. Take one tablet once a day    . buPROPion (WELLBUTRIN XL) 150 MG 24 hr tablet Take 1 tablet (150 mg total) by mouth every morning. 30 tablet 2  . calcium carbonate (OS-CAL) 600 MG TABS Take 600 mg by mouth 2 (two) times daily with a meal. Take one tablet once a day for calcium supplement    . doxepin (SINEQUAN) 25 MG capsule Take 2 capsules (50 mg total) by mouth at bedtime. 60 capsule 5  . fish oil-omega-3 fatty acids 1000 MG capsule Take 2 g by mouth daily. Take one tablet twice a day    . Glucosamine-Chondroit-Vit C-Mn (GLUCOSAMINE 1500 COMPLEX) CAPS Take 2 capsules by mouth daily.    . hydrocortisone (ANUSOL-HC) 2.5 % rectal cream Place 1 application rectally 2 (two) times daily. 30 g 0  . lidocaine (LIDODERM) 5 % Place 1 patch onto the skin daily. Remove & Discard patch within 12 hours 30 patch 0  . metoprolol tartrate  (LOPRESSOR) 25 MG tablet TAKE 1 TABLET BY MOUTH TWICE DAILY 180 tablet 3  . Multiple Vitamins-Minerals (PRESERVISION AREDS 2) CAPS Take 2 capsules by mouth daily.    . pantoprazole (PROTONIX) 20 MG tablet TAKE 1 TABLET(20 MG) BY MOUTH DAILY 90 tablet 0  . simvastatin (ZOCOR) 20 MG tablet Take 1 tablet (20 mg total) by mouth daily. 90 tablet 3  . tamsulosin (FLOMAX) 0.4 MG CAPS capsule TAKE 1 CAPSULE(0.4 MG) BY MOUTH DAILY 90 capsule 0  . TRINTELLIX 10 MG TABS TK 1 T PO QD  3  . venlafaxine XR (EFFEXOR-XR) 37.5 MG 24 hr capsule 1 qam  For  2 weeks  Then DC 30 capsule 1  . Zoster Vaccine Live, PF, (ZOSTAVAX) 93968 UNT/0.65ML injection Inject 19,400 Units into the skin once. 1 each 0   No current facility-administered medications for this visit.     Previous Psychotropic Medications: Yes   Substance Abuse History in the last 12 months:  No.  Consequences of Substance Abuse:   Medical Decision Making:  New problem, with additional work up planned  Treatment Plan Summary: At this time the patient reviews Effexor down to 37.5 mg take every day for 2 weeks and then stop it. He'll then give it to daily watch out. Start Wellbutrin at low dose 150 mg XL each morning. Within a month after that he'll likely see his psychiatrist and his new setting. This patient is not suicidal. This patient has a significant amount characterological issues. He is not suicidal and I have no evidence that using any substances.

## 2016-06-10 NOTE — Progress Notes (Signed)
Patient ID: Christian Munoz, male   DOB: [birth year], 81 y.o.   MRN: [id number] Patient ID: Christian Munoz, male   DOB: 10/27/1934, 81 y.o.   MRN: [id number]  Psychiatric Initial Adult Assessment   Patient Identification: Christian Munoz MRN:  [id number] Date of Evaluation:  06/10/2016 Referral Source: Dr. Sherrie Mustache Chief Complaint:   no motivation Visit Diagnosis: Major depression, chronic, mild Diagnosis:  Major depression, chronic, mild Patient Active Problem List   Diagnosis Date Noted  . History of fall [Z91.81] 02/28/2016  . Essential hypertension [I10] 12/04/2014  . BPH (benign prostatic hypertrophy) with urinary obstruction [N40.1] 12/04/2014  . Depression [F32.9] 12/04/2014  . Hyperlipidemia [E78.5] 01/06/2013  . Reflux esophagitis [K21.0]   . Osteoarthrosis, unspecified whether generalized or localized, unspecified site [M19.90]    History of Present Illness: Today the patient arrived 10 minutes late for a 15 minute visit. There is confusion about why he was here. He said he was called to take a cancellation position but he didn't really want to be seen earlier. I'm not clear why we called and he was is confused as I was. Nonetheless he said it made good use of his time by telling me that he was sleeping poorly. Today I offered him a sleeping aid and doxepin. There was little time to talk about much more 5 interestingly he now knowledge is that he would like to have a therapist. I think this is great. The patient denies persistent daily depression. He is denies any other significant physical complaints at this time. Is a very poor historian and is difficult to follow. Depression Symptoms:  fatigue, (Hypo) Manic Symptoms:   Anxiety Symptoms:   Psychotic Symptoms:   PTSD Symptoms:   Past Medical History:  Past Medical History:  Diagnosis Date  . Allergic rhinitis due to pollen   . Coronary atherosclerosis of native coronary artery   . Coronary atherosclerosis of  native coronary artery   . Depressive disorder, not elsewhere classified   . Herpes genitalia   . Hypertrophy of prostate without urinary obstruction and other lower urinary tract symptoms (LUTS)   . Hypertrophy of prostate without urinary obstruction and other lower urinary tract symptoms (LUTS)   . Lack of coordination   . Lack of coordination   . Lumbago   . Osteoarthrosis, unspecified whether generalized or localized, unspecified site   . Osteoarthrosis, unspecified whether generalized or localized, unspecified site   . Other and unspecified hyperlipidemia   . Other malaise and fatigue   . Reflux esophagitis   . Syncope and collapse   . Urinary frequency   . Vitamin D deficiency     Past Surgical History:  Procedure Laterality Date  . CARDIAC CATHETERIZATION    . poly vocal cords  1985  . SKIN CANCER EXCISION  2011   Family History:  Family History  Problem Relation Age of Onset  . Heart disease Father   . Cancer Father   . Cancer Brother   . Cancer Brother    Social History:   Social History   Social History  . Marital status: Married    Spouse name: N/A  . Number of children: N/A  . Years of education: N/A   Social History Main Topics  . Smoking status: Light Tobacco Smoker    Types: Cigarettes  . Smokeless tobacco: Never Used     Comment: keep trying   . Alcohol use No  . Drug use: No  . Sexual activity: Not Asked  Other Topics Concern  . None   Social History Narrative  . None   Additional Social History:   Musculoskeletal: Strength & Muscle Tone: within normal limits Gait & Station: normal Patient leans: N/A  Psychiatric Specialty Exam: HPI  ROS  Blood pressure 110/76, pulse 70, height '5\' 10"'$  (1.778 m), weight 171 lb (77.6 kg).Body mass index is 24.54 kg/m.  General Appearance: Casual  Eye Contact:  Good  Speech:  Clear and Coherent  Volume:  Normal  Mood:  NA  Affect:  Congruent  Thought Process:  Goal Directed  Orientation:  Full  (Time, Place, and Person)  Thought Content:  WDL  Suicidal Thoughts:  No  Homicidal Thoughts:  No  Memory:  NA  Judgement:  Good  Insight:  Good  Psychomotor Activity:  Normal  Concentration:  Good  Recall:  [place] of Knowledge:Fair  Language: Good  Akathisia:  No  Handed:  Right  AIMS (if indicated):    Assets:  Desire for Improvement  ADL's:  Intact  Cognition: WNL  Sleep:     Is the patient at risk to self?  No. Has the patient been a risk to self in the past 6 months?  No. Has the patient been a risk to self within the distant past?  No. Is the patient a risk to others?  No. Has the patient been a risk to others in the past 6 months?  No. Has the patient been a risk to others within the distant past?  No.  Allergies:   Allergies  Allergen Reactions  . Chantix [Varenicline]     Makes patient suicidal  . Zoloft [Sertraline Hcl]     Makes patient suicidal   Current Medications: Current Outpatient Prescriptions  Medication Sig Dispense Refill  . aspirin 81 MG tablet Take 81 mg by mouth daily. Take one tablet once a day    . buPROPion (WELLBUTRIN XL) 150 MG 24 hr tablet Take 1 tablet (150 mg total) by mouth every morning. 30 tablet 2  . calcium carbonate (OS-CAL) 600 MG TABS Take 600 mg by mouth 2 (two) times daily with a meal. Take one tablet once a day for calcium supplement    . doxepin (SINEQUAN) 25 MG capsule Take 2 capsules (50 mg total) by mouth at bedtime. 60 capsule 5  . fish oil-omega-3 fatty acids 1000 MG capsule Take 2 g by mouth daily. Take one tablet twice a day    . Glucosamine-Chondroit-Vit C-Mn (GLUCOSAMINE 1500 COMPLEX) CAPS Take 2 capsules by mouth daily.    . hydrocortisone (ANUSOL-HC) 2.5 % rectal cream Place 1 application rectally 2 (two) times daily. 30 g 0  . lidocaine (LIDODERM) 5 % Place 1 patch onto the skin daily. Remove & Discard patch within 12 hours 30 patch 0  . metoprolol tartrate (LOPRESSOR) 25 MG tablet TAKE 1 TABLET BY MOUTH TWICE  DAILY 180 tablet 3  . Multiple Vitamins-Minerals (PRESERVISION AREDS 2) CAPS Take 2 capsules by mouth daily.    . pantoprazole (PROTONIX) 20 MG tablet TAKE 1 TABLET(20 MG) BY MOUTH DAILY 90 tablet 0  . simvastatin (ZOCOR) 20 MG tablet Take 1 tablet (20 mg total) by mouth daily. 90 tablet 3  . tamsulosin (FLOMAX) 0.4 MG CAPS capsule TAKE 1 CAPSULE(0.4 MG) BY MOUTH DAILY 90 capsule 0  . TRINTELLIX 10 MG TABS TK 1 T PO QD  3  . venlafaxine XR (EFFEXOR-XR) 37.5 MG 24 hr capsule 1 qam  For 2 weeks  Then  DC 30 capsule 1  . Zoster Vaccine Live, PF, (ZOSTAVAX) 63785 UNT/0.65ML injection Inject 19,400 Units into the skin once. 1 each 0   No current facility-administered medications for this visit.     Previous Psychotropic Medications: Yes   Substance Abuse History in the last 12 months:  No.  Consequences of Substance Abuse:   Medical Decision Making:  New problem, with additional work up planned  Treatment Plan Summary: At this time the patient begin doxepin 25 mg for sleep. I will avoid any benzodiazepines in this individual I will assess patient to return to see me in 3 months we'll go ahead and sign and with a therapist. Charlestine Night Jearldine Cassady 8/30/20172:22 PM

## 2016-06-11 ENCOUNTER — Other Ambulatory Visit: Payer: Self-pay | Admitting: Nurse Practitioner

## 2016-06-11 DIAGNOSIS — E785 Hyperlipidemia, unspecified: Secondary | ICD-10-CM

## 2016-06-12 ENCOUNTER — Other Ambulatory Visit: Payer: Self-pay | Admitting: Nurse Practitioner

## 2016-06-22 ENCOUNTER — Other Ambulatory Visit (HOSPITAL_COMMUNITY): Payer: Self-pay | Admitting: Psychiatry

## 2016-06-22 DIAGNOSIS — F329 Major depressive disorder, single episode, unspecified: Secondary | ICD-10-CM

## 2016-06-24 ENCOUNTER — Ambulatory Visit (INDEPENDENT_AMBULATORY_CARE_PROVIDER_SITE_OTHER): Payer: No Typology Code available for payment source | Admitting: Licensed Clinical Social Worker

## 2016-06-24 DIAGNOSIS — F339 Major depressive disorder, recurrent, unspecified: Secondary | ICD-10-CM

## 2016-06-25 ENCOUNTER — Encounter (HOSPITAL_COMMUNITY): Payer: Self-pay | Admitting: Licensed Clinical Social Worker

## 2016-06-25 NOTE — Progress Notes (Signed)
   THERAPIST PROGRESS NOTE  Session Time: 3:10-4:00pm  Participation Level: Active  Behavioral Response: CasualAlertEuthymic  Type of Therapy: Individual Therapy  Treatment Goals addressed: Coping  Interventions: Supportive  Summary: Christian Munoz is a 81 y.o. male who presents with depression.  He did get the genetic test for anti-depressants and has an appt with Dr. Gretel Acre at [facility]. He has been titrating off the efexxor and has some side effects and called his pharmacist on Labor Day. He reports most of the side effects are gone except for a slight headache. Pt was interested in some meet-up groups to meet new people. Congratulated pt on making this positive step in his self-care. He is going to visit his daughter in [place] in the next couple of weeks so is going to hold off on making another appt until her returns. Pt seemed more clear today and making positive steps. He was receptive to suggestions and interventions.  Suicidal/Homicidal: Nowithout intent/plan  Therapist Response: Assessed pt's current functioning and reviewed progress. Assisted pt in processing issues with depressive symptoms, self-care and processing for management of stressors.    Diagnosis: Axis I: F33.9        MACKENZIE,LISBETH S, LCAS-A 06/25/2016

## 2016-07-07 ENCOUNTER — Other Ambulatory Visit: Payer: Self-pay | Admitting: Nurse Practitioner

## 2016-07-08 ENCOUNTER — Ambulatory Visit: Payer: Self-pay | Admitting: Psychiatry

## 2016-07-09 ENCOUNTER — Ambulatory Visit (HOSPITAL_COMMUNITY): Payer: Self-pay | Admitting: Psychiatry

## 2016-07-09 ENCOUNTER — Telehealth (HOSPITAL_COMMUNITY): Payer: Self-pay

## 2016-07-09 ENCOUNTER — Ambulatory Visit: Payer: Medicare Other | Admitting: Nurse Practitioner

## 2016-07-13 ENCOUNTER — Encounter: Payer: Self-pay | Admitting: Nurse Practitioner

## 2016-07-13 ENCOUNTER — Ambulatory Visit (INDEPENDENT_AMBULATORY_CARE_PROVIDER_SITE_OTHER): Payer: Medicare Other | Admitting: Nurse Practitioner

## 2016-07-13 ENCOUNTER — Telehealth (HOSPITAL_COMMUNITY): Payer: Self-pay

## 2016-07-13 VITALS — BP 118/82 | HR 64 | Temp 97.7°F | Resp 17 | Ht 70.0 in | Wt 171.8 lb

## 2016-07-13 DIAGNOSIS — F339 Major depressive disorder, recurrent, unspecified: Secondary | ICD-10-CM

## 2016-07-13 DIAGNOSIS — N401 Enlarged prostate with lower urinary tract symptoms: Secondary | ICD-10-CM

## 2016-07-13 DIAGNOSIS — E785 Hyperlipidemia, unspecified: Secondary | ICD-10-CM

## 2016-07-13 DIAGNOSIS — I10 Essential (primary) hypertension: Secondary | ICD-10-CM | POA: Diagnosis not present

## 2016-07-13 DIAGNOSIS — K2100 Gastro-esophageal reflux disease with esophagitis, without bleeding: Secondary | ICD-10-CM

## 2016-07-13 DIAGNOSIS — N138 Other obstructive and reflux uropathy: Secondary | ICD-10-CM

## 2016-07-13 DIAGNOSIS — R739 Hyperglycemia, unspecified: Secondary | ICD-10-CM

## 2016-07-13 DIAGNOSIS — Z23 Encounter for immunization: Secondary | ICD-10-CM

## 2016-07-13 LAB — COMPLETE METABOLIC PANEL WITH GFR
ALT: 5 U/L — ABNORMAL LOW (ref 9–46)
AST: 12 U/L (ref 10–35)
Albumin: 4 g/dL (ref 3.6–5.1)
Alkaline Phosphatase: 70 U/L (ref 40–115)
BUN: 21 mg/dL (ref 7–25)
CHLORIDE: 103 mmol/L (ref 98–110)
CO2: 26 mmol/L (ref 20–31)
Calcium: 9.5 mg/dL (ref 8.6–10.3)
Creat: 1.1 mg/dL (ref 0.70–1.18)
GFR, EST AFRICAN AMERICAN: 74 mL/min (ref >=60)
GFR, Est Non African American: 64 mL/min (ref >=60)
Glucose, Bld: 97 mg/dL (ref 65–99)
POTASSIUM: 4.3 mmol/L (ref 3.5–5.3)
Sodium: 139 mmol/L (ref 135–146)
Total Bilirubin: 0.4 mg/dL (ref 0.2–1.2)
Total Protein: 6.5 g/dL (ref 6.1–8.1)

## 2016-07-13 LAB — CBC WITH DIFFERENTIAL/PLATELET
BASOS ABS: 0 {cells}/uL (ref 0–200)
BASOS PCT: 0 %
EOS ABS: 93 {cells}/uL (ref 15–500)
Eosinophils Relative: 1 %
HCT: 43.2 % (ref 38.5–50.0)
HEMOGLOBIN: 14.1 g/dL (ref 13.2–17.1)
LYMPHS ABS: 3813 {cells}/uL (ref 850–3900)
Lymphocytes Relative: 41 %
MCH: 28.2 pg (ref 27.0–33.0)
MCHC: 32.6 g/dL (ref 32.0–36.0)
MCV: 86.4 fL (ref 80.0–100.0)
MONOS PCT: 5 %
MPV: 10.5 fL (ref 7.5–12.5)
Monocytes Absolute: 465 cells/uL (ref 200–950)
NEUTROS ABS: 4929 {cells}/uL (ref 1500–7800)
Neutrophils Relative %: 53 %
PLATELETS: 164 10*3/uL (ref 140–400)
RBC: 5 MIL/uL (ref 4.20–5.80)
RDW: 15.4 % — AB (ref 11.0–15.0)
WBC: 9.3 10*3/uL (ref 3.8–10.8)

## 2016-07-13 LAB — TSH: TSH: 2.62 m[IU]/L (ref 0.40–4.50)

## 2016-07-13 MED ORDER — ZOSTER VACCINE LIVE 19400 UNT/0.65ML ~~LOC~~ SUSR: 0.6500 mL | Freq: Once | SUBCUTANEOUS | 0 refills | Status: AC

## 2016-07-13 NOTE — Patient Instructions (Signed)
Follow up in 6 months for annual exam, place with LPN for annual exam then CPE with Janett Billow

## 2016-07-13 NOTE — Progress Notes (Signed)
Careteam: Patient Care Team: [name], NP as PCP - General (Nurse Practitioner)  Advanced Directive information Does patient have an advance directive?: Yes, Type of Advance Directive: Healthcare Power of [place];Living will;Out of facility DNR (pink MOST or yellow form)  Allergies  Allergen Reactions  . Chantix [Varenicline]     Makes patient suicidal  . Zoloft [Sertraline Hcl]     Makes patient suicidal    Chief Complaint  Patient presents with  . Medical Management of Chronic Issues    6 month follow up  . Other    Does not want flu vaccine     HPI: Patient is a 81 y.o. male seen in the office today for routine follow up.  Pt reports he is fair.  Seeing his psychiatrist. Very disappointed with [facility] Psych department. He is trying to switch from Dr Casimiro Needle. Has appt on Friday the 13th with new Doctor.  Latest medication he is on Bupropion XL. Which he feels like his effecting his vision. Most distracting when he reads  Also has been seeing therapist for counseling, which he does not feel like is very beneficial.   No SI, HI  Follow up with urologist every 2years, was having increase in frequency in urination. Medication change and it has improved   Has followed with surgery- Dr Hassell Done, plans to have surgery for prolapsed internal hemorrhoids but wants to wait until depression has improved.   Review of Systems:  Review of Systems  Constitutional: Negative for activity change, appetite change, fatigue and unexpected weight change.  HENT: Negative for congestion and hearing loss.   Eyes: Negative.   Respiratory: Negative for cough and shortness of breath.   Cardiovascular: Negative for chest pain, palpitations and leg swelling.  Gastrointestinal: Negative for abdominal pain, constipation and diarrhea.        hemorrhoids   Genitourinary: Positive for frequency. Negative for difficulty urinating and dysuria.  Musculoskeletal: Negative for arthralgias and  myalgias.  Skin: Negative for color change and wound.  Neurological: Negative for dizziness and weakness.  Psychiatric/Behavioral: Positive for dysphoric mood and sleep disturbance. Negative for agitation, behavioral problems and confusion.       Depression    Past Medical History:  Diagnosis Date  . Allergic rhinitis due to pollen   . Coronary atherosclerosis of native coronary artery   . Coronary atherosclerosis of native coronary artery   . Depressive disorder, not elsewhere classified   . Herpes genitalia   . Hypertrophy of prostate without urinary obstruction and other lower urinary tract symptoms (LUTS)   . Hypertrophy of prostate without urinary obstruction and other lower urinary tract symptoms (LUTS)   . Lack of coordination   . Lack of coordination   . Lumbago   . Osteoarthrosis, unspecified whether generalized or localized, unspecified site   . Osteoarthrosis, unspecified whether generalized or localized, unspecified site   . Other and unspecified hyperlipidemia   . Other malaise and fatigue   . Reflux esophagitis   . Syncope and collapse   . Urinary frequency   . Vitamin D deficiency    Past Surgical History:  Procedure Laterality Date  . CARDIAC CATHETERIZATION    . poly vocal cords  1985  . SKIN CANCER EXCISION  2011   Social History:   reports that he has been smoking Cigarettes.  He has never used smokeless tobacco. He reports that he does not drink alcohol or use drugs.  Family History  Problem Relation Age of Onset  .  Heart disease Father   . Cancer Father   . Cancer Brother   . Cancer Brother     Medications: Patient's Medications  New Prescriptions   No medications on file  Previous Medications   ASPIRIN 81 MG TABLET    Take 81 mg by mouth daily. Take one tablet once a day   BUPROPION (WELLBUTRIN XL) 150 MG 24 HR TABLET    Take 1 tablet (150 mg total) by mouth every morning.   CALCIUM CARBONATE (OS-CAL) 600 MG TABS    Take 600 mg by mouth 2  (two) times daily with a meal. Take one tablet once a day for calcium supplement   FISH OIL-OMEGA-3 FATTY ACIDS 1000 MG CAPSULE    Take 2 g by mouth daily. Take one tablet twice a day   GLUCOSAMINE-CHONDROIT-VIT C-MN (GLUCOSAMINE 1500 COMPLEX) CAPS    Take 2 capsules by mouth daily.   METOPROLOL TARTRATE (LOPRESSOR) 25 MG TABLET    TAKE 1 TABLET BY MOUTH TWICE DAILY   MULTIPLE VITAMINS-MINERALS (PRESERVISION AREDS 2) CAPS    Take 2 capsules by mouth daily.   PANTOPRAZOLE (PROTONIX) 20 MG TABLET    TAKE 1 TABLET(20 MG) BY MOUTH DAILY   SIMVASTATIN (ZOCOR) 20 MG TABLET    TAKE 1 TABLET(20 MG) BY MOUTH DAILY   TAMSULOSIN (FLOMAX) 0.4 MG CAPS CAPSULE    TAKE 1 CAPSULE(0.4 MG) BY MOUTH DAILY   ZOSTER VACCINE LIVE, PF, (ZOSTAVAX) 37482 UNT/0.65ML INJECTION    Inject 19,400 Units into the skin once.  Modified Medications   No medications on file  Discontinued Medications   DOXEPIN (SINEQUAN) 25 MG CAPSULE    Take 2 capsules (50 mg total) by mouth at bedtime.   HYDROCORTISONE (ANUSOL-HC) 2.5 % RECTAL CREAM    Place 1 application rectally 2 (two) times daily.   LIDOCAINE (LIDODERM) 5 %    Place 1 patch onto the skin daily. Remove & Discard patch within 12 hours   TRINTELLIX 10 MG TABS    TK 1 T PO QD   VENLAFAXINE XR (EFFEXOR-XR) 37.5 MG 24 HR CAPSULE    1 qam  For 2 weeks  Then DC     Physical Exam:  Vitals:   07/13/16 1320  BP: 118/82  Pulse: 64  Resp: 17  Temp: 97.7 F (36.5 C)  TempSrc: Oral  SpO2: 95%  Weight: 171 lb 12.8 oz (77.9 kg)  Height: 5' 10" (1.778 m)   Body mass index is 24.65 kg/m.  Physical Exam  Constitutional: He is oriented to person, place, and time. He appears well-developed and well-nourished. No distress.  HENT:  Head: Normocephalic and atraumatic.  Eyes: Conjunctivae are normal. Pupils are equal, round, and reactive to light.  Neck: Normal range of motion. Neck supple.  Cardiovascular: Normal rate, regular rhythm and normal heart sounds.   Bilateral  varicosities  Pulmonary/Chest: Effort normal and breath sounds normal.  Abdominal: Soft. Bowel sounds are normal.  Musculoskeletal: He exhibits no edema.  Neurological: He is alert and oriented to person, place, and time.  Skin: Skin is warm and dry. He is not diaphoretic.  Tattoo on his left arm states "I love [place]". Below this is a statement of "live free or die". He then has vertically in capital letters [employer] 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 His behavior is normal.    Labs reviewed: Basic Metabolic Panel:  Recent Labs  12/10/15 1017  NA 140  K 4.8  CL 101  CO2 21  GLUCOSE 102*  BUN 23  CREATININE 1.23  CALCIUM 9.1  TSH 4.690*   Liver Function Tests:  Recent Labs  12/10/15 1017  AST 18  ALT 14  ALKPHOS 77  BILITOT 0.3  PROT 6.3  ALBUMIN 4.1   No results for input(s): LIPASE, AMYLASE in the last 8760 hours. No results for input(s): AMMONIA in the last 8760 hours. CBC: No results for input(s): WBC, NEUTROABS, HGB, HCT, MCV, PLT in the last 8760 hours. Lipid Panel:  Recent Labs  12/10/15 1017  CHOL 123  HDL 37*  LDLCALC 70  TRIG 81  CHOLHDL 3.3   TSH:  Recent Labs  12/10/15 1017  TSH 4.690*   A1C: Lab Results  Component Value Date   HGBA1C 5.9 (H) 12/10/2015     Assessment/Plan 1. Essential hypertension Blood pressure stable on metoprolol - CBC with Differential/Platelets  2. Reflux esophagitis Stable on protonix   3. Benign prostatic hyperplasia with urinary obstruction -stable, conts on flomax  4. Hyperlipidemia, unspecified hyperlipidemia type LDL at goal at 70, cont on zocor  - CMP with eGFR  5. Episode of recurrent major depressive disorder, unspecified depression episode severity ([place]) -ongoing depression, following with psychiatry and psychology -to cont current medications, to follow up with eye MD due to blurry vision  - TSH - Vitamin D, 25-hydroxy  6.  Hyperglycemia - Hemoglobin A1c  7. Need for pneumococcal vaccine - Pneumococcal polysaccharide vaccine 23-valent greater than or equal to 2yo subcutaneous/IM   Jessica K. Harle Battiest  [facility] [phone] 8 am - 5 pm) [phone] (after hours)

## 2016-07-13 NOTE — Telephone Encounter (Signed)
Patient called and said that ever since starting Wellbutrin he has had a haziness in his eyes. Patient states it is like looking through a filter. I called Dr. Casimiro Needle who told me to have the patient stop Wellbutrin for a few days and see if it clears up. I called the patient back and relayed this message, patient voiced his understanding and he will call me back on Thursday to let me know how he is doing.

## 2016-07-14 LAB — HEMOGLOBIN A1C
Hgb A1c MFr Bld: 5.7 % — ABNORMAL HIGH (ref <5.7)
MEAN PLASMA GLUCOSE: 117 mg/dL

## 2016-07-14 LAB — VITAMIN D 25 HYDROXY (VIT D DEFICIENCY, FRACTURES): Vit D, 25-Hydroxy: 46 ng/mL (ref 30–100)

## 2016-07-16 ENCOUNTER — Telehealth (HOSPITAL_COMMUNITY): Payer: Self-pay

## 2016-07-21 ENCOUNTER — Ambulatory Visit (INDEPENDENT_AMBULATORY_CARE_PROVIDER_SITE_OTHER): Payer: No Typology Code available for payment source | Admitting: Licensed Clinical Social Worker

## 2016-07-21 ENCOUNTER — Encounter (HOSPITAL_COMMUNITY): Payer: Self-pay | Admitting: Licensed Clinical Social Worker

## 2016-07-21 DIAGNOSIS — F339 Major depressive disorder, recurrent, unspecified: Secondary | ICD-10-CM | POA: Diagnosis not present

## 2016-07-21 NOTE — Progress Notes (Signed)
   THERAPIST PROGRESS NOTE  Session Time: 2:10-3:00pm  Participation Level: Active  Behavioral Response: CasualAlertEuthymic  Type of Therapy: Individual Therapy  Treatment Goals addressed: Coping  Interventions: Supportive  Summary: Christian Munoz is a 81 y.o. male who presents with depression. Pt reported he missed his appt with Dr. Gretel Munoz at [facility] but now has one Thursady 07/23/16. Pt is hoping the psychiatrist will have ideas about medications for his depression. Processed with pt there is no "magic pill." Processed with pt his relationships with his children. He has 1 daughter he hasn't had contact with in 10 years. He has ok relationship with other daughter and son and 2 grandaughters. It distresses him he is not more close with them. DIscussed with pt ego integrity vs despair Christian Munoz) at length. Discussing regrets in his lifetime. 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 Pt is improving on his self-care: exercised 6 x iin 10 days and is eating more regular meals. Pt had questions for the nurse so took him to see Christian Munoz CMA.        Suicidal/Homicidal: Nowithout intent/plan  Therapist Response: Assessed pt's current functioning and reviewed progress. Assisted pt in processing issues with depressive symptoms, self-care and processing for management of stressors.    Diagnosis: Axis I: F33.9       [place]  07/21/2016

## 2016-07-23 ENCOUNTER — Other Ambulatory Visit: Payer: Self-pay | Admitting: Nurse Practitioner

## 2016-07-24 ENCOUNTER — Ambulatory Visit (INDEPENDENT_AMBULATORY_CARE_PROVIDER_SITE_OTHER): Payer: Medicare Other | Admitting: Psychiatry

## 2016-07-24 ENCOUNTER — Encounter: Payer: Self-pay | Admitting: Psychiatry

## 2016-07-24 VITALS — BP 132/80 | HR 60 | Temp 97.5°F | Wt 172.0 lb

## 2016-07-24 DIAGNOSIS — F339 Major depressive disorder, recurrent, unspecified: Secondary | ICD-10-CM

## 2016-07-24 DIAGNOSIS — F5101 Primary insomnia: Secondary | ICD-10-CM

## 2016-07-24 MED ORDER — BUPROPION HCL 75 MG PO TABS: 75.0000 mg | ORAL_TABLET | Freq: Every morning | ORAL | 1 refills | Status: DC

## 2016-07-24 MED ORDER — QUETIAPINE FUMARATE 25 MG PO TABS: 25.0000 mg | ORAL_TABLET | Freq: Every day | ORAL | 1 refills | Status: DC

## 2016-07-24 NOTE — Progress Notes (Signed)
Psychiatric Initial Adult Assessment   Patient Identification: Christian Munoz MRN:  [id number] Date of Evaluation:  07/24/2016 Referral Source: [facility]  Chief Complaint:   Chief Complaint    Establish Care; Depression; Fatigue     Visit Diagnosis:    ICD-9-CM ICD-10-CM   1. Major depression, recurrent, chronic (HCC) 296.30 F33.9   2. Primary insomnia 307.42 F51.01     History of Present Illness:    Patient is a 81 year old male who presented for initial assessment. He has recently been evaluated at the [facility] outpatient office by Dr. Jeb Levering. Ordered that he wanted to change his psychiatrist as he did not get along well with him. He also had his testing done for his vacations. He reported that his major issue is insomnia and he does not sleep until 7 AM. He reported that he was recently started on bupropion by Dr. Nelda Marseille. He went with his therapist but did not get along well. Patient reported that he wants to get his medications adjusted. Patient appears alert and calm during the interview. He reported that he has been tried on several psychotropic medications and brought a list of his medications. He reported that he wants something else to take for sleep at night. He is currently taking melatonin at this time. Patient ported that he currently lives by himself. He currently denied feeling depressed. He reported that he denied having any perceptual disturbances. He feels energetic in the evening and has been exercising around 10:30 PM. He also read several books throughout the day. Initially he  reported that his memory is impaired but when I did the MMSE on him it came out 30 over 30 and he was able to respond to all the questions.  He  currently denied having any suicidal homicidal ideations or plans. He was calm and alert throughout the interview.  Associated Signs/Symptoms: Depression Symptoms:  depressed mood, insomnia, fatigue, anxiety, disturbed sleep, (Hypo)  Manic Symptoms:  none Anxiety Symptoms:  Excessive Worry, Psychotic Symptoms:  none PTSD Symptoms: Negative NA  Past Psychiatric History:  Psychiatric Hospitalization x 2-  1981- Alcohol use 1968- Depression , anxiety  Previous Psychotropic Medications: Duloxetine Bupropion Trintillex Lorazepam  Doxepin Venlafaxine Melatonin   Substance Abuse History in the last 12 months:  No.  Consequences of Substance Abuse: Negative NA  Past Medical History:  Past Medical History:  Diagnosis Date  . Allergic rhinitis due to pollen   . Coronary atherosclerosis of native coronary artery   . Coronary atherosclerosis of native coronary artery   . Depressive disorder, not elsewhere classified   . Herpes genitalia   . Hypertrophy of prostate without urinary obstruction and other lower urinary tract symptoms (LUTS)   . Hypertrophy of prostate without urinary obstruction and other lower urinary tract symptoms (LUTS)   . Lack of coordination   . Lack of coordination   . Lumbago   . Osteoarthrosis, unspecified whether generalized or localized, unspecified site   . Osteoarthrosis, unspecified whether generalized or localized, unspecified site   . Other and unspecified hyperlipidemia   . Other malaise and fatigue   . Reflux esophagitis   . Skin cancer   . Syncope and collapse   . Urinary frequency   . Vitamin D deficiency     Past Surgical History:  Procedure Laterality Date  . CARDIAC CATHETERIZATION    . EYE SURGERY    . poly vocal cords  1985  . SKIN CANCER EXCISION  2011  . VASECTOMY  Family Psychiatric History:  Denied   Family History:  Family History  Problem Relation Age of Onset  . Heart disease Father   . Cancer Father   . Cancer Brother   . Cancer Brother     Social History:   Social History   Social History  . Marital status: Married    Spouse name: N/A  . Number of children: N/A  . Years of education: N/A   Social History Main Topics  .  Smoking status: Light Tobacco Smoker    Types: Cigarettes  . Smokeless tobacco: Never Used     Comment: keep trying   . Alcohol use No  . Drug use: No  . Sexual activity: Not Currently   Other Topics Concern  . None   Social History Narrative  . None    Additional Social History:  Divorced, Lives in [place].   Allergies:   Allergies  Allergen Reactions  . Chantix [Varenicline]     Makes patient suicidal  . Zoloft [Sertraline Hcl]     Makes patient suicidal    Metabolic Disorder Labs: Lab Results  Component Value Date   HGBA1C 5.7 (H) 07/13/2016   MPG 117 07/13/2016   No results found for: PROLACTIN Lab Results  Component Value Date   CHOL 123 12/10/2015   TRIG 81 12/10/2015   HDL 37 (L) 12/10/2015   CHOLHDL 3.3 12/10/2015   LDLCALC 70 12/10/2015   LDLCALC 72 08/09/2014     Current Medications: Current Outpatient Prescriptions  Medication Sig Dispense Refill  . aspirin 81 MG tablet Take 81 mg by mouth daily. Take one tablet once a day    . buPROPion (WELLBUTRIN XL) 150 MG 24 hr tablet Take 1 tablet (150 mg total) by mouth every morning. 30 tablet 2  . calcium carbonate (OS-CAL) 600 MG TABS Take 600 mg by mouth 2 (two) times daily with a meal. Take one tablet once a day for calcium supplement    . fish oil-omega-3 fatty acids 1000 MG capsule Take 2 g by mouth daily. Take one tablet twice a day    . Glucosamine-Chondroit-Vit C-Mn (GLUCOSAMINE 1500 COMPLEX) CAPS Take 2 capsules by mouth daily.    . metoprolol tartrate (LOPRESSOR) 25 MG tablet TAKE 1 TABLET BY MOUTH TWICE DAILY 180 tablet 1  . Multiple Vitamins-Minerals (PRESERVISION AREDS 2) CAPS Take 2 capsules by mouth daily.    . pantoprazole (PROTONIX) 20 MG tablet TAKE 1 TABLET(20 MG) BY MOUTH DAILY 90 tablet 1  . simvastatin (ZOCOR) 20 MG tablet TAKE 1 TABLET(20 MG) BY MOUTH DAILY 90 tablet 2  . tamsulosin (FLOMAX) 0.4 MG CAPS capsule TAKE 1 CAPSULE(0.4 MG) BY MOUTH DAILY 90 capsule 1   No current  facility-administered medications for this visit.     Neurologic: Headache: No Seizure: No Paresthesias:No  Musculoskeletal: Strength & Muscle Tone: within normal limits Gait & Station: normal Patient leans: N/A  Psychiatric Specialty Exam: ROS  Blood pressure 132/80, pulse 60, temperature 97.5 F (36.4 C), temperature source Oral, weight 172 lb (78 kg).Body mass index is 24.68 kg/m.  General Appearance: Casual  Eye Contact:  Fair  Speech:  Clear and Coherent  Volume:  Normal  Mood:  Anxious  Affect:  Congruent  Thought Process:  Coherent and Goal Directed  Orientation:  Full (Time, Place, and Person)  Thought Content:  WDL  Suicidal Thoughts:  No  Homicidal Thoughts:  No  Memory:  Immediate;   Fair Recent;   Fair Remote;  Fair  Judgement:  Fair  Insight:  Fair  Psychomotor Activity:  Normal  Concentration:  Concentration: Fair and Attention Span: Fair  Recall:  [employer] of Knowledge:Fair  Language: Fair  Akathisia:  No  Handed:  Right  AIMS (if indicated):    Assets:  Communication Skills Desire for Improvement Physical Health  ADL's:  Intact  Cognition: WNL  Sleep: poor    Treatment Plan Summary: Medication management   Discussed with him about his medications at length. He reported that he has tried several medications in the past.  I will decrease the dose of Wellbutrin to 75 mg in the morning as he is currently experiencing adverse reactions including problems in his eyes. He is active and to start taking Seroquel 12.5 mg at bedtime as he feels hyper and energetic in the evening. Advised him about the side effects of the medications. Patient reported that he will call for the follow-up appointment. I also advised him that I will be leaving this practice in the end of November and he demonstrated understanding. He will continue to follow-up with Dr. Jeb Levering in [place]   More than 50% of the time spent in psychoeducation, counseling and  coordination of care.    This note was generated in part or whole with voice recognition software. Voice regonition is usually quite accurate but there are transcription errors that can and very often do occur. I apologize for any typographical errors that were not detected and corrected.   Rainey Pines, MD 10/13/201711:16 AM

## 2016-08-21 ENCOUNTER — Ambulatory Visit (INDEPENDENT_AMBULATORY_CARE_PROVIDER_SITE_OTHER): Payer: Medicare Other | Admitting: Psychiatry

## 2016-08-21 ENCOUNTER — Encounter: Payer: Self-pay | Admitting: Psychiatry

## 2016-08-21 VITALS — BP 116/66 | HR 68 | Ht 70.0 in | Wt 175.8 lb

## 2016-08-21 DIAGNOSIS — F339 Major depressive disorder, recurrent, unspecified: Secondary | ICD-10-CM

## 2016-08-21 DIAGNOSIS — F5101 Primary insomnia: Secondary | ICD-10-CM

## 2016-08-21 MED ORDER — QUETIAPINE FUMARATE 25 MG PO TABS: 25.0000 mg | ORAL_TABLET | Freq: Every day | ORAL | 1 refills | Status: DC

## 2016-08-21 MED ORDER — ESCITALOPRAM OXALATE 5 MG PO TABS: 5.0000 mg | ORAL_TABLET | ORAL | 1 refills | Status: DC

## 2016-08-21 NOTE — Progress Notes (Signed)
Psychiatric MD Progress Note   Patient Identification: Christian Munoz MRN:  [id number] Date of Evaluation:  08/21/2016 Referral Source: [facility]  Chief Complaint:   Chief Complaint    Follow-up     Visit Diagnosis:    ICD-9-CM ICD-10-CM   1. Major depression, recurrent, chronic (HCC) 296.30 F33.9   2. Primary insomnia 307.42 F51.01     History of Present Illness:    Patient is a 81 year old male who presented forFollow-up. He reported that he is coming in late and was apologetic. He reported that he missed the office. He has recently been evaluated at the [facility] outpatient office by Dr. Jeb Levering. Patient brought a list of his symptoms and they were demented in detail. He has problems with sleep. He is currently taking Seroquel 12.5 mg at bedtime. It was also mentioned that he has been taking bupropion around 3- 4 AM. We discussed about his medications in detail. He reported that he continues to feel depressed and has problems with sleep.  Patient reported that he go to bed around midnight as he likes to read. He reported that he does not drive due to macular degeneration. He usually gets a ride to come to this office. He reported that he likes being and wants to continue coming to this office. We discussed about his medications in detail. He is receptive to increasing the dose of the Seroquel at this time. He currently denied having any suicidal ideations or plans. She denied having any perceptual disturbances.   He was calm and alert throughout the interview.  Associated Signs/Symptoms: Depression Symptoms:  depressed mood, insomnia, fatigue, anxiety, disturbed sleep, (Hypo) Manic Symptoms:  none Anxiety Symptoms:  Excessive Worry, Psychotic Symptoms:  none PTSD Symptoms: Negative NA  Past Psychiatric History:  Psychiatric Hospitalization x 2-  1981- Alcohol use 1968- Depression , anxiety  Previous Psychotropic  Medications: Duloxetine Bupropion Trintillex Lorazepam  Doxepin Venlafaxine Melatonin   Substance Abuse History in the last 12 months:  No.  Consequences of Substance Abuse: Negative NA  Past Medical History:  Past Medical History:  Diagnosis Date  . Allergic rhinitis due to pollen   . Coronary atherosclerosis of native coronary artery   . Coronary atherosclerosis of native coronary artery   . Depressive disorder, not elsewhere classified   . Herpes genitalia   . Hypertrophy of prostate without urinary obstruction and other lower urinary tract symptoms (LUTS)   . Hypertrophy of prostate without urinary obstruction and other lower urinary tract symptoms (LUTS)   . Lack of coordination   . Lack of coordination   . Lumbago   . Osteoarthrosis, unspecified whether generalized or localized, unspecified site   . Osteoarthrosis, unspecified whether generalized or localized, unspecified site   . Other and unspecified hyperlipidemia   . Other malaise and fatigue   . Reflux esophagitis   . Skin cancer   . Syncope and collapse   . Urinary frequency   . Vitamin D deficiency     Past Surgical History:  Procedure Laterality Date  . CARDIAC CATHETERIZATION    . EYE SURGERY    . poly vocal cords  1985  . SKIN CANCER EXCISION  2011  . VASECTOMY      Family Psychiatric History:  Denied   Family History:  Family History  Problem Relation Age of Onset  . Heart disease Father   . Cancer Father   . Cancer Brother   . Cancer Brother     Social History:   Social History  Social History  . Marital status: Married    Spouse name: N/A  . Number of children: N/A  . Years of education: N/A   Social History Main Topics  . Smoking status: Light Tobacco Smoker    Types: Cigarettes  . Smokeless tobacco: Never Used     Comment: 2 per day  . Alcohol use No  . Drug use: No  . Sexual activity: Not Currently   Other Topics Concern  . None   Social History Narrative  . None     Additional Social History:  Divorced, Lives in [place].   Allergies:   Allergies  Allergen Reactions  . Chantix [Varenicline]     Makes patient suicidal  . Zoloft [Sertraline Hcl]     Makes patient suicidal    Metabolic Disorder Labs: Lab Results  Component Value Date   HGBA1C 5.7 (H) 07/13/2016   MPG 117 07/13/2016   No results found for: PROLACTIN Lab Results  Component Value Date   CHOL 123 12/10/2015   TRIG 81 12/10/2015   HDL 37 (L) 12/10/2015   CHOLHDL 3.3 12/10/2015   LDLCALC 70 12/10/2015   LDLCALC 72 08/09/2014     Current Medications: Current Outpatient Prescriptions  Medication Sig Dispense Refill  . aspirin 81 MG tablet Take 81 mg by mouth daily. Take one tablet once a day    . buPROPion (WELLBUTRIN) 75 MG tablet Take 1 tablet (75 mg total) by mouth every morning. 30 tablet 1  . calcium carbonate (OS-CAL) 600 MG TABS Take 600 mg by mouth 2 (two) times daily with a meal. Take one tablet once a day for calcium supplement    . fish oil-omega-3 fatty acids 1000 MG capsule Take 2 g by mouth daily. Take one tablet twice a day    . Glucosamine-Chondroit-Vit C-Mn (GLUCOSAMINE 1500 COMPLEX) CAPS Take 2 capsules by mouth daily.    . metoprolol tartrate (LOPRESSOR) 25 MG tablet TAKE 1 TABLET BY MOUTH TWICE DAILY 180 tablet 1  . Multiple Vitamins-Minerals (PRESERVISION AREDS 2) CAPS Take 2 capsules by mouth daily.    . pantoprazole (PROTONIX) 20 MG tablet TAKE 1 TABLET(20 MG) BY MOUTH DAILY 90 tablet 1  . QUEtiapine (SEROQUEL) 25 MG tablet Take 1 tablet (25 mg total) by mouth at bedtime. 1/2- 1 pill po qhs 30 tablet 1  . simvastatin (ZOCOR) 20 MG tablet TAKE 1 TABLET(20 MG) BY MOUTH DAILY 90 tablet 2  . tamsulosin (FLOMAX) 0.4 MG CAPS capsule TAKE 1 CAPSULE(0.4 MG) BY MOUTH DAILY 90 capsule 1   No current facility-administered medications for this visit.     Neurologic: Headache: No Seizure: No Paresthesias:No  Musculoskeletal: Strength & Muscle Tone:  within normal limits Gait & Station: normal Patient leans: N/A  Psychiatric Specialty Exam: ROS  Blood pressure 116/66, pulse 68, height '5\' 10"'$  (1.778 m), weight 175 lb 12.8 oz (79.7 kg).Body mass index is 25.22 kg/m.  General Appearance: Casual  Eye Contact:  Fair  Speech:  Clear and Coherent  Volume:  Normal  Mood:  Anxious  Affect:  Congruent  Thought Process:  Coherent and Goal Directed  Orientation:  Full (Time, Place, and Person)  Thought Content:  WDL  Suicidal Thoughts:  No  Homicidal Thoughts:  No  Memory:  Immediate;   Fair Recent;   Fair Remote;   Fair  Judgement:  Fair  Insight:  Fair  Psychomotor Activity:  Normal  Concentration:  Concentration: Fair and Attention Span: Fair  Recall:  [employer]  of Knowledge:Fair  Language: Fair  Akathisia:  No  Handed:  Right  AIMS (if indicated):    Assets:  Communication Skills Desire for Improvement Physical Health  ADL's:  Intact  Cognition: WNL  Sleep: poor    Treatment Plan Summary: Medication management   Discussed with him about his medications at length. He reported that he has tried several medications in the past.  I will discontinue bupropion at this time. Start him on Lexapro 5 mg every morning and advised patient to take at 11:00 in the morning. He will titrate Seroquel 25 mg by mouth daily at bedtime and he agreed with the plan. Follow-up in one month or earlier depending on his symptoms     More than 50% of the time spent in psychoeducation, counseling and coordination of care.    This note was generated in part or whole with voice recognition software. Voice regonition is usually quite accurate but there are transcription errors that can and very often do occur. I apologize for any typographical errors that were not detected and corrected.   Rainey Pines, MD 11/10/201710:32 AM

## 2016-08-24 ENCOUNTER — Telehealth: Payer: Self-pay

## 2016-08-24 NOTE — Telephone Encounter (Signed)
Medication management - Attempted to reach patient to follow up on message requesting a call back.  Requested pt call this nurse back and to leave the nature of his call if unable to reach RN with call back.  Will continue to attempt to connect with pt.

## 2016-08-24 NOTE — Telephone Encounter (Signed)
Medication problem - Attempted to reach patient back several times this date after he left another message stating he was worried about continuing to take one of his medication but did not name.  Informed to please call our office back to discuss further so could relay an accurate message and concern to Dr. Gretel Acre.

## 2016-08-25 NOTE — Telephone Encounter (Signed)
noted 

## 2016-08-27 NOTE — Telephone Encounter (Signed)
Advise pt to continue lexapro as prescribed.

## 2016-08-27 NOTE — Telephone Encounter (Signed)
Pt returned call 11/14. We spoke about his concerns: starting lexapro based on genome study that says it may not work as well for him. States he showed the report to Dr. Gretel Acre. Based on his information, I reassured him that he would be ok to start it and to call  us back if he has additional concerns

## 2016-09-18 ENCOUNTER — Ambulatory Visit: Payer: Medicare Other | Admitting: Psychiatry

## 2016-09-20 ENCOUNTER — Other Ambulatory Visit: Payer: Self-pay | Admitting: Psychiatry

## 2016-09-23 ENCOUNTER — Telehealth: Payer: Self-pay | Admitting: *Deleted

## 2016-09-23 NOTE — Telephone Encounter (Signed)
Based on last labs his potasium is fine and should not need it however if this helps the cramps and he really wants to take it his kidney function is stable and should be able to excrete supplement approprietly. I would recommend a BMP follow up in 4 weeks if he is going to take it to make sure his levels do not get too high

## 2016-09-23 NOTE — Telephone Encounter (Signed)
Patient called and stated that he had eye surgery last week and his eye doctor stated it would be ok with him for him to take Potassium. Patient wants to take Potassium daily because he exercise regular and hasn't been able to. Has been getting more muscle cramps/strains and wants to the potassium once daily instead of irregular, Potassium Gluclnate 595. Please Advise.

## 2016-09-24 NOTE — Telephone Encounter (Signed)
LMOM to return call.

## 2016-10-18 ENCOUNTER — Other Ambulatory Visit: Payer: Self-pay | Admitting: Psychiatry

## 2016-10-20 ENCOUNTER — Encounter: Payer: Self-pay | Admitting: Nurse Practitioner

## 2016-10-22 ENCOUNTER — Ambulatory Visit (INDEPENDENT_AMBULATORY_CARE_PROVIDER_SITE_OTHER): Payer: Medicare Other | Admitting: Internal Medicine

## 2016-10-22 ENCOUNTER — Encounter: Payer: Self-pay | Admitting: Internal Medicine

## 2016-10-22 VITALS — BP 148/70 | HR 64 | Temp 98.1°F | Wt 162.0 lb

## 2016-10-22 DIAGNOSIS — R091 Pleurisy: Secondary | ICD-10-CM

## 2016-10-22 DIAGNOSIS — G609 Hereditary and idiopathic neuropathy, unspecified: Secondary | ICD-10-CM

## 2016-10-22 DIAGNOSIS — H547 Unspecified visual loss: Secondary | ICD-10-CM | POA: Diagnosis not present

## 2016-10-22 DIAGNOSIS — R739 Hyperglycemia, unspecified: Secondary | ICD-10-CM | POA: Diagnosis not present

## 2016-10-22 LAB — TSH: TSH: 2.2 mIU/L (ref 0.40–4.50)

## 2016-10-22 LAB — VITAMIN B12: Vitamin B-12: 230 pg/mL (ref 200–1100)

## 2016-10-22 NOTE — Progress Notes (Signed)
Location:  [facility] clinic Provider: Brentlee Delage L. Mariea Clonts, D.O., C.M.D.  Code Status: DNR Goals of Care:  Advanced Directives 07/13/2016  Does Patient Have a Medical Advance Directive? Yes  Type of Paramedic of [place];Living will;Out of facility DNR (pink MOST or yellow form)  Does patient want to make changes to medical advance directive? -  Copy of [place] in Chart? Yes  Some encounter information is confidential and restricted. Go to Review Flowsheets activity to see all data.     Chief Complaint  Patient presents with  . Acute Visit    sob    HPI: Patient is a 82 y.o. male with h/o major depression, hypertension, htn, hyperlipidemia, prior falls, bph, GERD, OA seen today for an acute visit for shortness of breath.  He had said to the receptionist that he was having pain breathing in and out.   No longer able to see well--only sees shadows--had a hole closed in his eye, but his vision has never improved.    He had pleurisy once before. He was up in a chair last night.  The night before he took extra strength tylenol and a sleeping pill, also kept him up the night before.  He exercised regularly prior to his eye surgery, and reports he has not been walking and exercising as much until a couple of days ago.  Had more pain why lying down in bed, was yelling out when trying to get up.  No edema in legs.    Toes on both feet have been bothering him--the two toes next to his pinky toes, sometimes fine and sometimes hurt like hell.  It's a tingling and burning pain.  Keeps his home cool around 60 degrees and may have to take his socks off.  They may hurt when he gets up.    His spirits were improving considerably, then his eye problems began end of Oct.  Surgery was early December.  Dr. Baird Cancer did the surgery.  Dr. Posey Pronto was his eye doctor.    He has also lost 9 lbs.  Has no desire to eat.  His son got him a ch[employer]-fila sandwich and he threw half out.      Past Medical History:  Diagnosis Date  . Allergic rhinitis due to pollen   . Coronary atherosclerosis of native coronary artery   . Coronary atherosclerosis of native coronary artery   . Depressive disorder, not elsewhere classified   . Herpes genitalia   . Hypertrophy of prostate without urinary obstruction and other lower urinary tract symptoms (LUTS)   . Hypertrophy of prostate without urinary obstruction and other lower urinary tract symptoms (LUTS)   . Lack of coordination   . Lack of coordination   . Lumbago   . Osteoarthrosis, unspecified whether generalized or localized, unspecified site   . Osteoarthrosis, unspecified whether generalized or localized, unspecified site   . Other and unspecified hyperlipidemia   . Other malaise and fatigue   . Reflux esophagitis   . Skin cancer   . Syncope and collapse   . Urinary frequency   . Vitamin D deficiency     Past Surgical History:  Procedure Laterality Date  . CARDIAC CATHETERIZATION    . EYE SURGERY    . poly vocal cords  1985  . SKIN CANCER EXCISION  2011  . VASECTOMY      Allergies  Allergen Reactions  . Chantix [Varenicline]     Makes patient suicidal  .  Zoloft [Sertraline Hcl]     Makes patient suicidal    Allergies as of 10/22/2016      Reactions   Chantix [varenicline]    Makes patient suicidal   Zoloft [sertraline Hcl]    Makes patient suicidal      Medication List       Accurate as of 10/22/16  3:23 PM. Always use your most recent med list.          aspirin 81 MG tablet Take 81 mg by mouth daily. Take one tablet once a day   calcium carbonate 600 MG Tabs tablet Commonly known as:  OS-CAL Take 600 mg by mouth 2 (two) times daily with a meal. Take one tablet once a day for calcium supplement   escitalopram 5 MG tablet Commonly known as:  LEXAPRO Take 1 tablet (5 mg total) by mouth every morning.   fish oil-omega-3 fatty acids 1000 MG capsule Take 2 g by mouth daily. Take one tablet twice a  day   GLUCOSAMINE 1500 COMPLEX Caps Take 2 capsules by mouth daily.   metoprolol tartrate 25 MG tablet Commonly known as:  LOPRESSOR TAKE 1 TABLET BY MOUTH TWICE DAILY   pantoprazole 20 MG tablet Commonly known as:  PROTONIX TAKE 1 TABLET(20 MG) BY MOUTH DAILY   PRESERVISION AREDS 2 Caps Take 2 capsules by mouth daily.   QUEtiapine 25 MG tablet Commonly known as:  SEROQUEL Take 1 tablet (25 mg total) by mouth at bedtime. 1/2- 1 pill po qhs   simvastatin 20 MG tablet Commonly known as:  ZOCOR TAKE 1 TABLET(20 MG) BY MOUTH DAILY   tamsulosin 0.4 MG Caps capsule Commonly known as:  FLOMAX TAKE 1 CAPSULE(0.4 MG) BY MOUTH DAILY   triamcinolone cream 0.1 % Commonly known as:  KENALOG Apply 1 application topically 2 (two) times daily.       Review of Systems:  Review of Systems  Constitutional: Negative for chills and fever.  HENT: Positive for hearing loss.        Bilateral hearing aides  Eyes: Positive for blurred vision.       Nearly blind now he reports  Respiratory: Negative for cough, sputum production, shortness of breath and wheezing.   Cardiovascular: Positive for chest pain. Negative for palpitations.  Musculoskeletal: Negative for falls.       Using walking stick now  Psychiatric/Behavioral: Positive for depression.    Health Maintenance  Topic Date Due  . ZOSTAVAX  07/31/1997  . INFLUENZA VACCINE  05/12/2016  . TETANUS/TDAP  04/10/2024 (Originally 07/31/1954)  . PNA vac Low Risk Adult  Completed    Physical Exam: Vitals:   10/22/16 1516  BP: (!) 148/70  Pulse: 92  Temp: 98.1 F (36.7 C)  TempSrc: Oral  SpO2: (!) 64%  Weight: 162 lb (73.5 kg)   Body mass index is 23.24 kg/m. Physical Exam  Constitutional: No distress.  Has lost weight  Cardiovascular: Normal rate, regular rhythm, normal heart sounds and intact distal pulses.   Pulmonary/Chest: Effort normal and breath sounds normal. No respiratory distress. He has no wheezes. He has no  rales. He exhibits no tenderness.  No chest tenderness  Abdominal: Bowel sounds are normal.  Musculoskeletal: Normal range of motion.  Walking with walking stick now, requires guidance  Skin: Skin is warm and dry. There is pallor.  Psychiatric: He has a normal mood and affect.    Labs reviewed: Basic Metabolic Panel:  Recent Labs  12/10/15 1017 07/13/16 1407  NA  140 139  K 4.8 4.3  CL 101 103  CO2 21 26  GLUCOSE 102* 97  BUN 23 21  CREATININE 1.23 1.10  CALCIUM 9.1 9.5  TSH 4.690* 2.62   Liver Function Tests:  Recent Labs  12/10/15 1017 07/13/16 1407  AST 18 12  ALT 14 5*  ALKPHOS 77 70  BILITOT 0.3 0.4  PROT 6.3 6.5  ALBUMIN 4.1 4.0   No results for input(s): LIPASE, AMYLASE in the last 8760 hours. No results for input(s): AMMONIA in the last 8760 hours. CBC:  Recent Labs  07/13/16 1407  WBC 9.3  NEUTROABS 4,929  HGB 14.1  HCT 43.2  MCV 86.4  PLT 164   Lipid Panel:  Recent Labs  12/10/15 1017  CHOL 123  HDL 37*  LDLCALC 70  TRIG 81  CHOLHDL 3.3   Lab Results  Component Value Date   HGBA1C 5.7 (H) 07/13/2016    Assessment/Plan 1. Pleurisy -seems nearly resolved, may have been more of a costochondritis -using tylenol -POX normal (was initially acidentally entered as 64 but was 94%) -call back if symptoms persist or worsen  2. Hyperglycemia -last hba1c still in this range on seroquel  3. Hereditary and idiopathic peripheral neuropathy -new symptoms in 3rd and 4th toes - TSH - Vitamin B12  4. Visual loss -need records from Dr. Baird Cancer and Dr. Posey Pronto who have been addressing his visual deficits and Dr. Baird Cancer performed surgery on him  Labs/tests ordered:   Orders Placed This Encounter  Procedures  . TSH  . Vitamin B12   Next appt:  01/14/2017--keep regular visit with Janett Billow  Deena Shaub L. Dejan Angert, D.O. [facility] [address], [place] [address] Cell Phone (Mon-Fri 8am-5pm):   [phone] On Call:  [phone] & follow prompts after 5pm & weekends Office Phone:  [phone] Office Fax:  [phone]

## 2016-10-23 ENCOUNTER — Emergency Department (HOSPITAL_COMMUNITY): Payer: Medicare Other

## 2016-10-23 ENCOUNTER — Encounter (HOSPITAL_COMMUNITY): Payer: Self-pay

## 2016-10-23 ENCOUNTER — Telehealth: Payer: Self-pay

## 2016-10-23 ENCOUNTER — Emergency Department (HOSPITAL_COMMUNITY)
Admission: EM | Admit: 2016-10-23 | Discharge: 2016-10-23 | Disposition: A | Discharge status: Home / Self Care | Payer: Medicare Other | Attending: Emergency Medicine | Admitting: Emergency Medicine

## 2016-10-23 ENCOUNTER — Ambulatory Visit
Admission: RE | Admit: 2016-10-23 | Discharge: 2016-10-23 | Disposition: A | Discharge status: Home / Self Care | Payer: Medicare Other | Source: Ambulatory Visit | Attending: Internal Medicine | Admitting: Internal Medicine

## 2016-10-23 DIAGNOSIS — I2510 Atherosclerotic heart disease of native coronary artery without angina pectoris: Secondary | ICD-10-CM | POA: Diagnosis not present

## 2016-10-23 DIAGNOSIS — R0602 Shortness of breath: Secondary | ICD-10-CM | POA: Insufficient documentation

## 2016-10-23 DIAGNOSIS — J188 Other pneumonia, unspecified organism: Secondary | ICD-10-CM | POA: Insufficient documentation

## 2016-10-23 DIAGNOSIS — I10 Essential (primary) hypertension: Secondary | ICD-10-CM | POA: Diagnosis not present

## 2016-10-23 DIAGNOSIS — Z79899 Other long term (current) drug therapy: Secondary | ICD-10-CM | POA: Insufficient documentation

## 2016-10-23 DIAGNOSIS — Z7982 Long term (current) use of aspirin: Secondary | ICD-10-CM | POA: Insufficient documentation

## 2016-10-23 DIAGNOSIS — R0781 Pleurodynia: Secondary | ICD-10-CM

## 2016-10-23 DIAGNOSIS — F17210 Nicotine dependence, cigarettes, uncomplicated: Secondary | ICD-10-CM | POA: Diagnosis not present

## 2016-10-23 DIAGNOSIS — J189 Pneumonia, unspecified organism: Secondary | ICD-10-CM

## 2016-10-23 DIAGNOSIS — R918 Other nonspecific abnormal finding of lung field: Secondary | ICD-10-CM

## 2016-10-23 DIAGNOSIS — Z85828 Personal history of other malignant neoplasm of skin: Secondary | ICD-10-CM | POA: Diagnosis not present

## 2016-10-23 LAB — CBC WITH DIFFERENTIAL/PLATELET
BASOS ABS: 0 10*3/uL (ref 0.0–0.1)
BASOS PCT: 0 %
Eosinophils Absolute: 0.1 10*3/uL (ref 0.0–0.7)
Eosinophils Relative: 1 %
HCT: 34.8 % — ABNORMAL LOW (ref 39.0–52.0)
HEMOGLOBIN: 11 g/dL — AB (ref 13.0–17.0)
LYMPHS PCT: 25 %
Lymphs Abs: 3.2 10*3/uL (ref 0.7–4.0)
MCH: 27.1 pg (ref 26.0–34.0)
MCHC: 31.6 g/dL (ref 30.0–36.0)
MCV: 85.7 fL (ref 78.0–100.0)
MONO ABS: 0.8 10*3/uL (ref 0.1–1.0)
Monocytes Relative: 6 %
NEUTROS ABS: 9 10*3/uL — AB (ref 1.7–7.7)
NEUTROS PCT: 68 %
Platelets: 291 10*3/uL (ref 150–400)
RBC: 4.06 MIL/uL — AB (ref 4.22–5.81)
RDW: 14.2 % (ref 11.5–15.5)
WBC: 13.1 10*3/uL — AB (ref 4.0–10.5)

## 2016-10-23 LAB — BASIC METABOLIC PANEL
ANION GAP: 12 (ref 5–15)
BUN: 11 mg/dL (ref 6–20)
CALCIUM: 8.7 mg/dL — AB (ref 8.9–10.3)
CO2: 24 mmol/L (ref 22–32)
Chloride: 98 mmol/L — ABNORMAL LOW (ref 101–111)
Creatinine, Ser: 1.13 mg/dL (ref 0.61–1.24)
GFR, EST NON AFRICAN AMERICAN: 60 mL/min — AB (ref >60)
GLUCOSE: 168 mg/dL — AB (ref 65–99)
POTASSIUM: 3.7 mmol/L (ref 3.5–5.1)
Sodium: 134 mmol/L — ABNORMAL LOW (ref 135–145)

## 2016-10-23 LAB — BRAIN NATRIURETIC PEPTIDE: B NATRIURETIC PEPTIDE 5: 133.4 pg/mL — AB (ref 0.0–100.0)

## 2016-10-23 LAB — TROPONIN I: Troponin I: <0.03 ng/mL (ref <0.03)

## 2016-10-23 MED ORDER — IOPAMIDOL (ISOVUE-370) INJECTION 76%
INTRAVENOUS | Status: AC
  Administered 2016-10-23: 100 mL
  Filled 2016-10-23: qty 100

## 2016-10-23 MED ORDER — AZITHROMYCIN 250 MG PO TABS: 250.0000 mg | ORAL_TABLET | Freq: Every day | ORAL | 0 refills | Status: DC

## 2016-10-23 MED ORDER — AMOXICILLIN 500 MG PO CAPS: 500.0000 mg | ORAL_CAPSULE | Freq: Three times a day (TID) | ORAL | 0 refills | Status: DC

## 2016-10-23 MED ORDER — AMOXICILLIN 500 MG PO CAPS
1000.0000 mg | ORAL_CAPSULE | Freq: Once | ORAL | Status: AC
  Administered 2016-10-23: 1000 mg via ORAL
  Filled 2016-10-23: qty 2

## 2016-10-23 MED ORDER — SODIUM CHLORIDE 0.9 % IV BOLUS (SEPSIS)
1000.0000 mL | Freq: Once | INTRAVENOUS | Status: AC
  Administered 2016-10-23: 1000 mL via INTRAVENOUS

## 2016-10-23 MED ORDER — AZITHROMYCIN 250 MG PO TABS
500.0000 mg | ORAL_TABLET | Freq: Once | ORAL | Status: AC
  Administered 2016-10-23: 500 mg via ORAL
  Filled 2016-10-23: qty 2

## 2016-10-23 NOTE — ED Notes (Signed)
Patient reports that he will be going home by cab d/t poor night vision.

## 2016-10-23 NOTE — ED Notes (Signed)
EKG given to Dr. Kohut 

## 2016-10-23 NOTE — Telephone Encounter (Signed)
Patient called c/o pain in lungs, worse. Patient was seen yesterday and instructed to call if symptoms persist or progress. Pain is worse at night and sometimes causes patient to yell out. Patient had to sleep sitting in a chair and is also experiencing sweats off/on x 3 nights (not mentioned at [place] yesterday). Patient aware Dr.Reed is out of office and I will contact her and call him back with recommendations.  I spoke with Dr.Reed via cell phone and was informed to place order for chest xray. If chest xray is negative the covering provider/PCP will need to consider a CT of Chest.  Order placed. I left message on voicemail informing patient of Dr.Reed's recommendations. I left the address for [facility] and instructed patient to call if he has questions or concerns.   Message will be forwarded to PCP- Sherrie Mustache, NP

## 2016-10-23 NOTE — Telephone Encounter (Signed)
We received correspondence from [facility] for visit on 10/20/16. Under the medical history was a diagnosis of HIV/Infectious Disease, h/o herpes.  I called [facility] to clarify diagnosis for we are the patient's primary care provider and we do not have a diagnosis of HIV or h/o herpes on patient's chart. Also after reviewing the patient's chart we do not see any notes from Infectious Disease or no medications for HIV treatment.  I spoke with Amy and this is a misprint. The office of [facility] groups all infectious diseases together. The patient selected the box that said HIV/Infectious Disease and wrote out to the side h/o Herpes and it was placed in his past medical history as so.

## 2016-10-23 NOTE — Telephone Encounter (Signed)
Patient returned call and stated he can not see, so he was unable to listen to message (could not see what button to press). I informed patient of Dr.Reed's response and patient  would have to call a cab to go to [place]. Patient states I am very discouraged, I am not going to do anything. I asked patient if I can call his son to see if he could assist him and he said "Do not call anyone." Patient ended call.  2 minutes later patient called back and apologized for ending call abruptly. Patient would like to reconsider Dr.Reed's recommendations. Patient states he is going to try his best to go get xray. Patient is devastated that he can not see.

## 2016-10-23 NOTE — ED Triage Notes (Signed)
Per pT, Pt is coming from PCP with reports of PNA and blood on the left lung after having a chest xray today. Pt reports that he was having left lower back pain at night and MD ordered xray to diagnose. Denies chest pain or SOB.

## 2016-10-23 NOTE — Telephone Encounter (Signed)
noted 

## 2016-10-23 NOTE — ED Provider Notes (Signed)
[facility] DEPT Provider Note   CSN: [id number] Arrival date & time: 10/23/16  1507 By signing my name below, I, Georgette Shell, attest that this documentation has been prepared under the direction and in the presence of Virgel Manifold, MD. Electronically Signed: Georgette Shell, ED Scribe. 10/23/16. 6:44 PM.  History   Chief Complaint Chief Complaint  Patient presents with  . Pneumonia   The history is provided by the patient. No language interpreter was used.    HPI Comments: Worley Radermacher is a 82 y.o. male with h/o skin cancer and HTN, who presents to the Emergency Department complaining of intermittent left lower back pain onset a couple days ago. Pain is exacerbated with deep breathing, lying flat, and sneezing; pain is improved with sitting up. He has not tried any OTC medications PTA. Pt was seen by his PCP and had a CXR done that showed pneumonia and blood in his left lung and was referred here. He denies h/o DVT/PE. Pt further denies cough, chest pain, leg swelling, ankle pain, fever, chills, or any other associated symptoms.  PCP: Sherrie Mustache, NP  Past Medical History:  Diagnosis Date  . Allergic rhinitis due to pollen   . Coronary atherosclerosis of native coronary artery   . Coronary atherosclerosis of native coronary artery   . Depressive disorder, not elsewhere classified   . Herpes genitalia   . Hypertrophy of prostate without urinary obstruction and other lower urinary tract symptoms (LUTS)   . Hypertrophy of prostate without urinary obstruction and other lower urinary tract symptoms (LUTS)   . Lack of coordination   . Lack of coordination   . Lumbago   . Osteoarthrosis, unspecified whether generalized or localized, unspecified site   . Osteoarthrosis, unspecified whether generalized or localized, unspecified site   . Other and unspecified hyperlipidemia   . Other malaise and fatigue   . Reflux esophagitis   . Skin cancer   . Syncope and collapse   . Urinary  frequency   . Vitamin D deficiency     Patient Active Problem List   Diagnosis Date Noted  . History of fall 02/28/2016  . Essential hypertension 12/04/2014  . Benign prostatic hyperplasia with urinary obstruction 12/04/2014  . Depression 12/04/2014  . Hyperlipidemia 01/06/2013  . Reflux esophagitis   . Osteoarthrosis, unspecified whether generalized or localized, unspecified site     Past Surgical History:  Procedure Laterality Date  . CARDIAC CATHETERIZATION    . EYE SURGERY    . poly vocal cords  1985  . SKIN CANCER EXCISION  2011  . VASECTOMY         Home Medications    Prior to Admission medications   Medication Sig Start Date End Date Taking? Authorizing Provider  aspirin 81 MG tablet Take 81 mg by mouth daily. Take one tablet once a day    Historical Provider, MD  calcium carbonate (OS-CAL) 600 MG TABS Take 600 mg by mouth 2 (two) times daily with a meal. Take one tablet once a day for calcium supplement    Historical Provider, MD  escitalopram (LEXAPRO) 5 MG tablet Take 1 tablet (5 mg total) by mouth every morning. 08/21/16   Rainey Pines, MD  fish oil-omega-3 fatty acids 1000 MG capsule Take 2 g by mouth daily. Take one tablet twice a day    Historical Provider, MD  Glucosamine-Chondroit-Vit C-Mn (GLUCOSAMINE 1500 COMPLEX) CAPS Take 2 capsules by mouth daily.    Historical Provider, MD  metoprolol tartrate (LOPRESSOR) 25 MG  tablet TAKE 1 TABLET BY MOUTH TWICE DAILY 07/08/16   [name], NP  Multiple Vitamins-Minerals (PRESERVISION AREDS 2) CAPS Take 2 capsules by mouth daily.    Historical Provider, MD  pantoprazole (PROTONIX) 20 MG tablet TAKE 1 TABLET(20 MG) BY MOUTH DAILY 07/23/16   [name], NP  QUEtiapine (SEROQUEL) 25 MG tablet Take 1 tablet (25 mg total) by mouth at bedtime. 1/2- 1 pill po qhs 08/21/16   Rainey Pines, MD  simvastatin (ZOCOR) 20 MG tablet TAKE 1 TABLET(20 MG) BY MOUTH DAILY 06/12/16   [name], NP  tamsulosin (FLOMAX) 0.4  MG CAPS capsule TAKE 1 CAPSULE(0.4 MG) BY MOUTH DAILY 06/12/16   [name], NP  triamcinolone cream (KENALOG) 0.1 % Apply 1 application topically 2 (two) times daily.    Historical Provider, MD    Family History Family History  Problem Relation Age of Onset  . Heart disease Father   . Cancer Father   . Cancer Brother   . Cancer Brother     Social History Social History  Substance Use Topics  . Smoking status: Light Tobacco Smoker    Types: Cigarettes  . Smokeless tobacco: Never Used     Comment: 2 per day  . Alcohol use No     Allergies   Chantix [varenicline] and Zoloft [sertraline hcl]   Review of Systems Review of Systems  Constitutional: Negative for chills and fever.  Respiratory: Negative for cough and shortness of breath.   Cardiovascular: Negative for chest pain and leg swelling.  Musculoskeletal: Positive for back pain.  All other systems reviewed and are negative.    Physical Exam Updated Vital Signs BP 131/61   Pulse 88   Temp 98 F (36.7 C) (Oral)   Resp 18   Ht '5\' 10"'$  (1.778 m)   Wt 162 lb (73.5 kg)   SpO2 98%   BMI 23.24 kg/m   Physical Exam  Constitutional: He is oriented to person, place, and time. Vital signs are normal. He appears well-developed and well-nourished.  Non-toxic appearance. He does not appear ill. No distress.  HENT:  Head: Normocephalic and atraumatic.  Nose: Nose normal.  Mouth/Throat: Oropharynx is clear and moist. No oropharyngeal exudate.  Eyes: Conjunctivae and EOM are normal. Pupils are equal, round, and reactive to light. No scleral icterus.  Neck: Normal range of motion. Neck supple. No tracheal deviation, no edema, no erythema and normal range of motion present. No thyroid mass and no thyromegaly present.  Cardiovascular: Normal rate, regular rhythm, S1 normal, S2 normal, normal heart sounds, intact distal pulses and normal pulses.  Exam reveals no gallop and no friction rub.   No murmur  heard. Pulmonary/Chest: Effort normal and breath sounds normal. No respiratory distress. He has no wheezes. He has no rhonchi. He has no rales.  Abdominal: Soft. Normal appearance and bowel sounds are normal. He exhibits no distension, no ascites and no mass. There is no hepatosplenomegaly. There is no tenderness. There is no rebound, no guarding and no CVA tenderness.  Musculoskeletal: Normal range of motion. He exhibits no edema or tenderness.  Lymphadenopathy:    He has no cervical adenopathy.  Neurological: He is alert and oriented to person, place, and time. He has normal strength. No cranial nerve deficit or sensory deficit.  Skin: Skin is warm, dry and intact. No petechiae and no rash noted. He is not diaphoretic. No erythema. No pallor.  Nursing note and vitals reviewed.    ED Treatments /  Results  DIAGNOSTIC STUDIES: Oxygen Saturation is 98% on RA, normal by my interpretation.    COORDINATION OF CARE: 6:44 PM Discussed treatment plan with pt at bedside which includes CT scan and pt agreed to plan.  Labs (all labs ordered are listed, but only abnormal results are displayed) Labs Reviewed  BRAIN NATRIURETIC PEPTIDE - Abnormal; Notable for the following:       Result Value   B Natriuretic Peptide 133.4 (*)    All other components within normal limits  BASIC METABOLIC PANEL - Abnormal; Notable for the following:    Sodium 134 (*)    Chloride 98 (*)    Glucose, Bld 168 (*)    Calcium 8.7 (*)    GFR calc non Af Amer 60 (*)    All other components within normal limits  CBC WITH DIFFERENTIAL/PLATELET - Abnormal; Notable for the following:    WBC 13.1 (*)    RBC 4.06 (*)    Hemoglobin 11.0 (*)    HCT 34.8 (*)    Neutro Abs 9.0 (*)    All other components within normal limits  TROPONIN I    EKG  EKG Interpretation  Date/Time:  Friday October 23 2016 18:31:59 EST Ventricular Rate:  82 PR Interval:    QRS Duration: 89 QT Interval:  391 QTC Calculation: 457 R  Axis:   49 Text Interpretation:  Sinus rhythm Abnormal R-wave progression, early transition no ischemic appearance. no old comparison Confirmed by Johnney Killian, MD, Jeannie Done [phone]) on 10/26/2016 1:43:59 PM       Radiology No results found.   Dg Chest 2 View  Result Date: 10/23/2016 CLINICAL DATA:  Left posterior chest pain for the past 3 days. EXAM: CHEST  2 VIEW COMPARISON:  12/07/2013. FINDINGS: Normal sized heart. Interval patchy and linear density at the left lung base. Clear right lung. Mild thoracic spine degenerative changes and scoliosis. IMPRESSION: Left basilar atelectasis and pneumonia. Pulmonary infarction is also a possibility. Electronically Signed   By: Claudie Revering M.D.   On: 10/23/2016 14:15   Ct Angio Chest Pe W And/or Wo Contrast  Result Date: 10/23/2016 CLINICAL DATA:  82 y/o M; pleuritic left mid back pain and abnormal chest radiograph. EXAM: CT ANGIOGRAPHY CHEST WITH CONTRAST TECHNIQUE: Multidetector CT imaging of the chest was performed using the standard protocol during bolus administration of intravenous contrast. Multiplanar CT image reconstructions and MIPs were obtained to evaluate the vascular anatomy. CONTRAST:  100 cc Isovue 370. COMPARISON:  10/23/2016 chest radiograph. FINDINGS: Cardiovascular: Satisfactory opacification of the pulmonary arteries to the segmental level. No evidence of pulmonary embolism. Normal heart size. Moderate coronary artery calcification. Normal caliber thoracic aorta. Mild aortic atherosclerosis with calcification. Mediastinum/Nodes: Sub- carinal lymphadenopathy measuring up to 13 mm short axis. Left infrahilar lymph nodes are confluent with the masslike opacification in the infrahilar region. Lungs/Pleura: There is dense masslike left infrahilar and lower lobe consolidation measuring up to 5.6 x 6.8 cm (AP by ML, series 401, image 63) there are ground-glass opacities throughout the rest of the left lower lobe and several nodular foci for example near  the superior segment of left lower lobe measuring up to 26 mm at the periphery (series 407, image 63). There is tapering occlusion of a left lower lobe segmental bronchus (series 404, image 58) and of a left lower lobe segmental pulmonary artery (series 404, image 62) near the hilum. Additionally, there is occlusion of the left inferior pulmonary vein. Moderate centrilobular emphysema of the lungs. There is  a moderate left-sided pleural effusion. Upper Abdomen: Numerous fluid attenuating lucencies throughout the liver, some too small to characterize, probably representing multiple liver cysts measuring up to 4.5 mm. Musculoskeletal: No acute osseous abnormality. Review of the MIP images confirms the above findings. IMPRESSION: 1. No evidence for pulmonary embolus. 2. Masslike opacity in the left infrahilar region with occlusion of segmental bronchi, a segmental pulmonary artery, and the left inferior pulmonary vein. Findings probably represent underlying bronchogenic carcinoma. 3. Additional ground-glass opacities and nodules throughout the left lower lobe may represent postobstructive pneumonitis and/or lymphangitic carcinomatosis. 4. Subcarinal lymphadenopathy. 5. Moderate emphysema. 6. Moderate coronary artery calcification and mild aortic atherosclerosis. 7. Multiple lucencies throughout the liver with fluid attenuation likely cysts, some too small to characterize. These results were called by telephone at the time of interpretation on 10/23/2016 at 9:30 pm to Dr. Virgel Manifold , who verbally acknowledged these results. Electronically Signed   By: Kristine Garbe M.D.   On: 10/23/2016 21:31    Procedures Procedures (including critical care time)  Medications Ordered in ED Medications - No data to display   Initial Impression / Assessment and Plan / ED Course  I have reviewed the triage vital signs and the nursing notes.  Pertinent labs & imaging results that were available during my care of  the patient were reviewed by me and considered in my medical decision making (see chart for details).  Clinical Course     Unfortunately imaging as above. Discussed with pt. Will place on abx, but more importantly he needs to follow-up with oncology as soon as he can.   Final Clinical Impressions(s) / ED Diagnoses   Final diagnoses:  Lung mass  Postobstructive pneumonia    New Prescriptions New Prescriptions   No medications on file   I personally preformed the services scribed in my presence. The recorded information has been reviewed is accurate. Virgel Manifold, MD.     Virgel Manifold, MD 11/04/16 [phone]

## 2016-10-26 ENCOUNTER — Telehealth: Payer: Self-pay

## 2016-10-26 ENCOUNTER — Encounter: Payer: Self-pay | Admitting: *Deleted

## 2016-10-26 NOTE — Telephone Encounter (Signed)
I called patient to see how he was doing and to see if we could schedule him for a follow up appointment on his pneumonia. I left a message for patient to call the office.

## 2016-10-27 ENCOUNTER — Ambulatory Visit (INDEPENDENT_AMBULATORY_CARE_PROVIDER_SITE_OTHER): Payer: Medicare Other | Admitting: Nurse Practitioner

## 2016-10-27 ENCOUNTER — Encounter: Payer: Self-pay | Admitting: Nurse Practitioner

## 2016-10-27 VITALS — BP 124/84 | HR 88 | Temp 98.0°F | Resp 18 | Ht 70.0 in | Wt 163.2 lb

## 2016-10-27 DIAGNOSIS — J181 Lobar pneumonia, unspecified organism: Secondary | ICD-10-CM

## 2016-10-27 DIAGNOSIS — J189 Pneumonia, unspecified organism: Secondary | ICD-10-CM

## 2016-10-27 DIAGNOSIS — H3530 Unspecified macular degeneration: Secondary | ICD-10-CM

## 2016-10-27 DIAGNOSIS — R918 Other nonspecific abnormal finding of lung field: Secondary | ICD-10-CM

## 2016-10-27 NOTE — Progress Notes (Signed)
Careteam: Patient Care Team: [name], NP as PCP - General (Nurse Practitioner)  Advanced Directive information Does Patient Have a Medical Advance Directive?: Yes, Type of Advance Directive: Healthcare Power of Attorney  Allergies  Allergen Reactions  . Chantix [Varenicline]     Makes patient suicidal  . Zoloft [Sertraline Hcl]     Makes patient suicidal    Chief Complaint  Patient presents with  . Acute Visit    Discuss cancer found in lung and would like a referral to cancer center.      HPI: Patient is a 82 y.o. male seen in the office today to follow up ED visit. Pt was sent to the ED after chest xray was obtained due to chest pain. Xray revealed Left basilar atelectasis and pneumonia. Pulmonary infarction is also a possibility.  CT obtained and revealed No evidence for pulmonary embolus. 2. Masslike opacity in the left infrahilar region with occlusion of segmental bronchi, a segmental pulmonary artery, and the left inferior pulmonary vein. Findings probably represent underlying bronchogenic carcinoma. 3. Additional ground-glass opacities and nodules throughout the left lower lobe may represent postobstructive pneumonitis and/or lymphangitic carcinomatosis. 4. Subcarinal lymphadenopathy. 5. Moderate emphysema. 6. Moderate coronary artery calcification and mild aortic atherosclerosis. 7. Multiple lucencies throughout the liver with fluid attenuation likely cysts, some too small to characterize.  Pt was told he had a lung cancer and to follow up with PCP and oncology. Pt was also given amoxicillin and azithromycin for pneumonia. currently cough is not bad- no pain with cough. No shortness of breath or fever noted.   Christian Munoz said Dr Baird Cancer (ophthalmology) said to talk to his PCP regarding his vision.  Trying to get different lens because his vision is so poor.  Seeing shadows, his peripheral vision is better.   Review of Systems:  Review of Systems   Constitutional: Negative for activity change, appetite change, fatigue and unexpected weight change.  HENT: Negative for congestion and hearing loss.   Eyes: Positive for visual disturbance.  Respiratory: Negative for cough and shortness of breath.   Cardiovascular: Negative for chest pain, palpitations and leg swelling.  Gastrointestinal: Negative for abdominal pain, constipation and diarrhea.  Genitourinary: Negative for difficulty urinating and dysuria.  Musculoskeletal: Negative for arthralgias and myalgias.  Skin: Negative for color change and wound.  Neurological: Negative for dizziness and weakness.  Psychiatric/Behavioral: Positive for sleep disturbance. Negative for agitation, behavioral problems and confusion.       Depression- has improved    Past Medical History:  Diagnosis Date  . Allergic rhinitis due to pollen   . Coronary atherosclerosis of native coronary artery   . Coronary atherosclerosis of native coronary artery   . Depressive disorder, not elsewhere classified   . Herpes genitalia   . Hypertrophy of prostate without urinary obstruction and other lower urinary tract symptoms (LUTS)   . Hypertrophy of prostate without urinary obstruction and other lower urinary tract symptoms (LUTS)   . Lack of coordination   . Lack of coordination   . Lumbago   . Osteoarthrosis, unspecified whether generalized or localized, unspecified site   . Osteoarthrosis, unspecified whether generalized or localized, unspecified site   . Other and unspecified hyperlipidemia   . Other malaise and fatigue   . Reflux esophagitis   . Skin cancer   . Syncope and collapse   . Urinary frequency   . Vitamin D deficiency    Past Surgical History:  Procedure Laterality Date  . CARDIAC  CATHETERIZATION    . EYE SURGERY    . poly vocal cords  1985  . SKIN CANCER EXCISION  2011  . VASECTOMY     Social History:   reports that he has quit smoking. His smoking use included Cigarettes. He has  never used smokeless tobacco. He reports that he does not drink alcohol or use drugs.  Family History  Problem Relation Age of Onset  . Heart disease Father   . Cancer Father   . Cancer Brother   . Cancer Brother     Medications: Patient's Medications  New Prescriptions   No medications on file  Previous Medications   AMOXICILLIN (AMOXIL) 500 MG CAPSULE    Take 1 capsule (500 mg total) by mouth 3 (three) times daily.   ASPIRIN 81 MG TABLET    Take 81 mg by mouth daily. Take one tablet once a day   AZITHROMYCIN (ZITHROMAX) 250 MG TABLET    Take 1 tablet (250 mg total) by mouth daily. Take first 2 tablets together, then 1 every day until finished.   CALCIUM CARBONATE (OS-CAL) 600 MG TABS    Take 600 mg by mouth 2 (two) times daily with a meal. Take one tablet once a day for calcium supplement   ESCITALOPRAM (LEXAPRO) 5 MG TABLET    Take 1 tablet (5 mg total) by mouth every morning.   FISH OIL-OMEGA-3 FATTY ACIDS 1000 MG CAPSULE    Take 2 g by mouth daily. Take one tablet twice a day   GLUCOSAMINE-CHONDROIT-VIT C-MN (GLUCOSAMINE 1500 COMPLEX) CAPS    Take 2 capsules by mouth daily.   METOPROLOL TARTRATE (LOPRESSOR) 25 MG TABLET    TAKE 1 TABLET BY MOUTH TWICE DAILY   MULTIPLE VITAMINS-MINERALS (PRESERVISION AREDS 2) CAPS    Take 2 capsules by mouth daily.   PANTOPRAZOLE (PROTONIX) 20 MG TABLET    TAKE 1 TABLET(20 MG) BY MOUTH DAILY   QUETIAPINE (SEROQUEL) 25 MG TABLET    Take 1 tablet (25 mg total) by mouth at bedtime. 1/2- 1 pill po qhs   SIMVASTATIN (ZOCOR) 20 MG TABLET    TAKE 1 TABLET(20 MG) BY MOUTH DAILY   TAMSULOSIN (FLOMAX) 0.4 MG CAPS CAPSULE    TAKE 1 CAPSULE(0.4 MG) BY MOUTH DAILY   TRIAMCINOLONE CREAM (KENALOG) 0.1 %    Apply 1 application topically 2 (two) times daily.  Modified Medications   No medications on file  Discontinued Medications   No medications on file     Physical Exam:  Vitals:   10/27/16 1527  BP: 124/84  Pulse: 88  Resp: 18  Temp: 98 F (36.7 C)    TempSrc: Oral  SpO2: 95%  Weight: 163 lb 3.2 oz (74 kg)  Height: '5\' 10"'$  (1.778 m)   Body mass index is 23.42 kg/m.  Physical Exam  Constitutional: He is oriented to person, place, and time. No distress.  8 lb weight loss since 07/2016  Cardiovascular: Normal rate, regular rhythm and normal heart sounds.   Pulmonary/Chest: Effort normal and breath sounds normal. No respiratory distress. He has no wheezes. He has no rales. He exhibits no tenderness.  Abdominal: Soft. Bowel sounds are normal.  Musculoskeletal: Normal range of motion.  Walking with walking stick now, requires guidance  Neurological: He is alert and oriented to person, place, and time.  Skin: Skin is warm and dry. There is pallor.  Psychiatric: He has a normal mood and affect.    Labs reviewed: Basic Metabolic Panel:  Recent  Labs  12/10/15 1017 07/13/16 1407 10/22/16 1601 10/23/16 1523  NA 140 139  --  134*  K 4.8 4.3  --  3.7  CL 101 103  --  98*  CO2 21 26  --  24  GLUCOSE 102* 97  --  168*  BUN 23 21  --  11  CREATININE 1.23 1.10  --  1.13  CALCIUM 9.1 9.5  --  8.7*  TSH 4.690* 2.62 2.20  --    Liver Function Tests:  Recent Labs  12/10/15 1017 07/13/16 1407  AST 18 12  ALT 14 5*  ALKPHOS 77 70  BILITOT 0.3 0.4  PROT 6.3 6.5  ALBUMIN 4.1 4.0   No results for input(s): LIPASE, AMYLASE in the last 8760 hours. No results for input(s): AMMONIA in the last 8760 hours. CBC:  Recent Labs  07/13/16 1407 10/23/16 1523  WBC 9.3 13.1*  NEUTROABS 4,929 9.0*  HGB 14.1 11.0*  HCT 43.2 34.8*  MCV 86.4 85.7  PLT 164 291   Lipid Panel:  Recent Labs  12/10/15 1017  CHOL 123  HDL 37*  LDLCALC 70  TRIG 81  CHOLHDL 3.3   TSH:  Recent Labs  12/10/15 1017 07/13/16 1407 10/22/16 1601  TSH 4.690* 2.62 2.20   A1C: Lab Results  Component Value Date   HGBA1C 5.7 (H) 07/13/2016   10/23/16 CT angio 1.No evidence for pulmonary embolus. 2. Masslike opacity in the left infrahilar region  with occlusion of segmental bronchi, a segmental pulmonary artery, and the left inferior pulmonary vein. Findings probably represent underlying bronchogenic carcinoma. 3. Additional ground-glass opacities and nodules throughout the left lower lobe may represent postobstructive pneumonitis and/or lymphangitic carcinomatosis. 4. Subcarinal lymphadenopathy. 5. Moderate emphysema. 6. Moderate coronary artery calcification and mild aortic atherosclerosis. 7. Multiple lucencies throughout the liver with fluid attenuation likely cysts, some too small to characterize.    Assessment/Plan 1. Lung mass Reviewed results of CT scan with pt and his son who is at visit today. Pt does wish to pursue evaluation and treatment of lung mass. Will refer to pulmonary for follow up and pathology, then will send to oncology for treatment based on these results.  Pt not having any pain at this time.  - Ambulatory referral to Pulmonology  2. Macular degeneration -has lost visit in left eye due to macular hole and can not see his phone or read, no longer driving at this time. -ophthalmology recommended 6 month follow up, no improvement with pinhole test which is test to see if glasses would improve vision.  - Ambulatory referral to [facility] to help with resources for the legal blind.  3. Pneumonia of left lower lobe due to infectious organism ([place]) -without pain, shortness of breath or fevers, overall feeling much better. Tolerating antibiotics without GI upset. Can use florastor twice daily for 1 week to prevent upset and to take with food.  -cont compete course.   Christian Munoz. Christian Munoz  [facility] [phone] 8 am - 5 pm) [phone] (after hours)

## 2016-10-27 NOTE — Patient Instructions (Signed)
To use probiotic (florastor) twice daily for 1 week We will place referral for pulmonary for further evaluation of lung nodule

## 2016-10-30 ENCOUNTER — Telehealth: Payer: Self-pay | Admitting: *Deleted

## 2016-10-30 NOTE — Telephone Encounter (Signed)
I agree, okay for him to go if he is feeling up to it. Make sure to stay hydration and eat proper meals. Make sure he will be able to sit down if he is fatigued.

## 2016-10-30 NOTE — Telephone Encounter (Signed)
Patient son, Christian Munoz, called and stated that patient is interested in going to an event this weekend but wants to make sure it is ok with you with him having pneumonia. Son told him as long as he was feeling ok and not fatigue it would be fine, but patient wants your opinion. Please Advise.

## 2016-10-30 NOTE — Telephone Encounter (Signed)
Patient son notified and agreed.  ?

## 2016-11-09 ENCOUNTER — Telehealth: Payer: Self-pay

## 2016-11-09 NOTE — Telephone Encounter (Signed)
I spoke with patient's son to inform him that Christian Munoz spoke with patient's opthalmology office and she was told that patient could schedule an appointment to be reevaluated for new glasses to see if new glasses would make a difference in patient's vision.   Patient's son verbalized understanding and stated that he would call to schedule an appointment with opthalmology.

## 2016-11-12 ENCOUNTER — Other Ambulatory Visit: Payer: Self-pay | Admitting: Psychiatry

## 2016-11-13 ENCOUNTER — Encounter: Payer: Self-pay | Admitting: Emergency Medicine

## 2016-11-13 ENCOUNTER — Telehealth: Payer: Self-pay

## 2016-11-13 ENCOUNTER — Ambulatory Visit (INDEPENDENT_AMBULATORY_CARE_PROVIDER_SITE_OTHER): Payer: Medicare Other | Admitting: Emergency Medicine

## 2016-11-13 VITALS — BP 110/80 | HR 83 | Ht 70.0 in | Wt 157.6 lb

## 2016-11-13 DIAGNOSIS — R918 Other nonspecific abnormal finding of lung field: Secondary | ICD-10-CM | POA: Diagnosis not present

## 2016-11-13 DIAGNOSIS — I2510 Atherosclerotic heart disease of native coronary artery without angina pectoris: Secondary | ICD-10-CM | POA: Diagnosis not present

## 2016-11-13 MED ORDER — ESCITALOPRAM OXALATE 5 MG PO TABS: 5.0000 mg | ORAL_TABLET | ORAL | 0 refills | Status: DC

## 2016-11-13 NOTE — Progress Notes (Signed)
Subjective:    Patient ID: Christian Munoz, male    DOB: 07-11-1935, 82 y.o.   MRN: [id number]  HPI 74 Former smoker with a history of coronary disease last cath was '95, several squamous cell skin cancers, prostatic hypertrophy, macular degeneration. He is referred for an abnormal CT scan chest. He tells me that he started to have L back and flank pleuritic pain that started about two weeks ago. He was treated for PNA at the same time. Ct chest was done that I have reviewed >> shows for pulmonary embolism, there is a left infrahilar rounded opacity surrounds the segmental pulmonary artery and vein.  L lower lobe bronchus appears to be encased and narrowed by external compression, unclear whether there is an endobronchial lesion. There is a small L effusion. The proximal mass extends out to a more peripheral rounded satellite lesion. The entire picture looks like primary lung CA  Review of Systems  Constitutional: Positive for unexpected weight change. Negative for fever.  HENT: Positive for rhinorrhea and sneezing. Negative for congestion, dental problem, ear pain, nosebleeds, postnasal drip, sinus pressure, sore throat and trouble swallowing.   Eyes: Negative.  Negative for redness and itching.  Respiratory: Positive for cough. Negative for chest tightness, shortness of breath and wheezing.   Cardiovascular: Negative.  Negative for palpitations and leg swelling.  Gastrointestinal: Negative.  Negative for nausea and vomiting.  Genitourinary: Negative.  Negative for dysuria.  Musculoskeletal: Negative.  Negative for joint swelling.  Skin: Negative.  Negative for rash.  Neurological: Negative.  Negative for headaches.  Hematological: Negative.  Does not bruise/bleed easily.  Psychiatric/Behavioral: Negative.  Negative for dysphoric mood. The patient is not nervous/anxious.    Past Medical History:  Diagnosis Date  . Allergic rhinitis due to pollen   . Coronary atherosclerosis of native  coronary artery   . Coronary atherosclerosis of native coronary artery   . Depressive disorder, not elsewhere classified   . Herpes genitalia   . Hypertrophy of prostate without urinary obstruction and other lower urinary tract symptoms (LUTS)   . Hypertrophy of prostate without urinary obstruction and other lower urinary tract symptoms (LUTS)   . Lack of coordination   . Lack of coordination   . Lumbago   . Osteoarthrosis, unspecified whether generalized or localized, unspecified site   . Osteoarthrosis, unspecified whether generalized or localized, unspecified site   . Other and unspecified hyperlipidemia   . Other malaise and fatigue   . Reflux esophagitis   . Skin cancer   . Syncope and collapse   . Urinary frequency   . Vitamin D deficiency      Family History  Problem Relation Age of Onset  . Heart disease Father   . Cancer Father   . Cancer Brother   . Cancer Brother      Social History   Social History  . Marital status: Married    Spouse name: N/A  . Number of children: N/A  . Years of education: N/A   Occupational History  . Not on file.   Social History Main Topics  . Smoking status: Former Smoker    Types: Cigarettes    Quit date: 08/12/2016  . Smokeless tobacco: Never Used     Comment: 3 per day  . Alcohol use No  . Drug use: No  . Sexual activity: Not Currently   Other Topics Concern  . Not on file   Social History Narrative  . 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   Allergies  Allergen Reactions  . Chantix [Varenicline]     Makes patient suicidal  . Zoloft [Sertraline Hcl]     Makes patient suicidal     Outpatient Medications Prior to Visit  Medication Sig Dispense Refill  . aspirin 81 MG tablet Take 81 mg by mouth daily. Take one tablet once a day    . calcium carbonate (OS-CAL) 600 MG TABS Take 600 mg by mouth 2 (two) times daily with a meal. Take one tablet once a day for  calcium supplement    . escitalopram (LEXAPRO) 5 MG tablet Take 1 tablet (5 mg total) by mouth every morning. 30 tablet 1  . fish oil-omega-3 fatty acids 1000 MG capsule Take 2 g by mouth daily. Take one tablet twice a day    . Glucosamine-Chondroit-Vit C-Mn (GLUCOSAMINE 1500 COMPLEX) CAPS Take 2 capsules by mouth daily.    . metoprolol tartrate (LOPRESSOR) 25 MG tablet TAKE 1 TABLET BY MOUTH TWICE DAILY 180 tablet 1  . Multiple Vitamins-Minerals (PRESERVISION AREDS 2) CAPS Take 2 capsules by mouth daily.    . pantoprazole (PROTONIX) 20 MG tablet TAKE 1 TABLET(20 MG) BY MOUTH DAILY 90 tablet 1  . QUEtiapine (SEROQUEL) 25 MG tablet Take 1 tablet (25 mg total) by mouth at bedtime. 1/2- 1 pill po qhs 30 tablet 1  . simvastatin (ZOCOR) 20 MG tablet TAKE 1 TABLET(20 MG) BY MOUTH DAILY 90 tablet 2  . tamsulosin (FLOMAX) 0.4 MG CAPS capsule TAKE 1 CAPSULE(0.4 MG) BY MOUTH DAILY 90 capsule 1  . amoxicillin (AMOXIL) 500 MG capsule Take 1 capsule (500 mg total) by mouth 3 (three) times daily. (Patient not taking: Reported on 11/13/2016) 21 capsule 0  . azithromycin (ZITHROMAX) 250 MG tablet Take 1 tablet (250 mg total) by mouth daily. Take first 2 tablets together, then 1 every day until finished. (Patient not taking: Reported on 11/13/2016) 6 tablet 0  . triamcinolone cream (KENALOG) 0.1 % Apply 1 application topically 2 (two) times daily.     No facility-administered medications prior to visit.         Objective:   Physical Exam Vitals:   11/13/16 1130  BP: 110/80  Pulse: 83  SpO2: 95%  Weight: 157 lb 9.6 oz (71.5 kg)  Height: '5\' 10"'$  (1.778 m)   Gen: Pleasant, well-nourished, in no distress,  normal affect  ENT: No lesions,  mouth clear,  oropharynx clear, no postnasal drip, poor vision, hearing aides  Neck: No JVD, no stridor  Lungs: No use of accessory muscles, decreased on the L, clear without rales or rhonchi  Cardiovascular: RRR, heart sounds normal, no murmur or gallops, no peripheral  edema  Musculoskeletal: No deformities, no cyanosis or clubbing  Neuro: alert, non focal  Skin: Warm, no lesions or rashes   CT chest 10/23/16 --  COMPARISON:  10/23/2016 chest radiograph.  FINDINGS: Cardiovascular: Satisfactory opacification of the pulmonary arteries to the segmental level. No evidence of pulmonary embolism. Normal heart size. Moderate coronary artery calcification. Normal caliber thoracic aorta. Mild aortic atherosclerosis with calcification.  Mediastinum/Nodes: Sub- carinal lymphadenopathy measuring up to 13 mm short axis. Left infrahilar lymph nodes are confluent with the masslike opacification in the infrahilar region.  Lungs/Pleura: There is dense masslike left infrahilar and lower lobe consolidation measuring up to 5.6 x 6.8 cm (AP by ML, series 401, image 63) there are ground-glass opacities throughout the rest of  the left lower lobe and several nodular foci for example near the superior segment of left lower lobe measuring up to 26 mm at the periphery (series 407, image 63). There is tapering occlusion of a left lower lobe segmental bronchus (series 404, image 58) and of a left lower lobe segmental pulmonary artery (series 404, image 62) near the hilum. Additionally, there is occlusion of the left inferior pulmonary vein.  Moderate centrilobular emphysema of the lungs.  There is a moderate left-sided pleural effusion.  Upper Abdomen: Numerous fluid attenuating lucencies throughout the liver, some too small to characterize, probably representing multiple liver cysts measuring up to 4.5 mm.  Musculoskeletal: No acute osseous abnormality.  Review of the MIP images confirms the above findings.  IMPRESSION: 1. No evidence for pulmonary embolus. 2. Masslike opacity in the left infrahilar region with occlusion of segmental bronchi, a segmental pulmonary artery, and the left inferior pulmonary vein. Findings probably represent  underlying bronchogenic carcinoma. 3. Additional ground-glass opacities and nodules throughout the left lower lobe may represent postobstructive pneumonitis and/or lymphangitic carcinomatosis. 4. Subcarinal lymphadenopathy. 5. Moderate emphysema. 6. Moderate coronary artery calcification and mild aortic atherosclerosis. 7. Multiple lucencies throughout the liver with fluid attenuation likely cysts, some too small to characterize.      Assessment & Plan:  Lung mass Patient presents with left pleuritic pain and a CT scan of the chest that now identifies a left parahilar mass and a smaller more peripheral satellite lesion. Most consistent with primary lung CA. There is some compression of the LLL bronchus, ? Whether there is an endobronchial lesion. He does have subcarinal LAD. I believe most straightforward approach would be FOB + EBUS to biopsy. He has a hx CAD, will check an ECG now. If normal then will go ahead and schedule. If any abnormality then will request a cardiology eval prior to general anesthesia.   Baltazar Apo, MD, PhD 11/13/2016, 12:14 PM [facility] [phone] or if no answer [phone]

## 2016-11-13 NOTE — Telephone Encounter (Signed)
RX sent, patient's son aware

## 2016-11-13 NOTE — Telephone Encounter (Signed)
Message left on clinical intake voicemail:   Patient is out of his escitalopram 5 mg once daily that is prescribed by patient's psych doctor. Patient is unable to make contact with his psych doctor and would like to know if Christian Munoz will prescribe for #30  Please advise

## 2016-11-13 NOTE — Patient Instructions (Addendum)
Your ECG today was normal.  We will work on scheduling a bronchoscopy with endobronchial ultrasound to biopsy your left lung mass.  We will set a follow up visit for next available opening

## 2016-11-13 NOTE — Telephone Encounter (Signed)
Yes this is okay 

## 2016-11-13 NOTE — Assessment & Plan Note (Signed)
Patient presents with left pleuritic pain and a CT scan of the chest that now identifies a left parahilar mass and a smaller more peripheral satellite lesion. Most consistent with primary lung CA. There is some compression of the LLL bronchus, ? Whether there is an endobronchial lesion. He does have subcarinal LAD. I believe most straightforward approach would be FOB + EBUS to biopsy. He has a hx CAD, will check an ECG now. If normal then will go ahead and schedule. If any abnormality then will request a cardiology eval prior to general anesthesia.

## 2016-11-18 ENCOUNTER — Encounter (HOSPITAL_COMMUNITY): Payer: Self-pay | Admitting: *Deleted

## 2016-11-18 ENCOUNTER — Other Ambulatory Visit: Payer: Self-pay | Admitting: Emergency Medicine

## 2016-11-18 ENCOUNTER — Telehealth: Payer: Self-pay | Admitting: Emergency Medicine

## 2016-11-18 NOTE — Telephone Encounter (Signed)
RB- the hospital is calling needing orders for procedure scheduled for 11/20/16  Thanks

## 2016-11-18 NOTE — Progress Notes (Signed)
Pt SDW-Pre-op call was completed by both pt and pt Son, Vicente Males [facility]). Pt denies SOB, chest pain, and being under the care of a cardiologist. Pt stated that he last saw a cardiologist " 6 years ago in [place]." Pt stated that a cardiac cath was done 10 years ago and a stress test was performed > 10 years ago. Pt denies having an echo. Pt made aware to stop taking Aspirin, vitamins, fish oil, Glucosamine and herbal medications. Do not take any NSAIDs ie: Ibuprofen, Advil, Naproxen, BC and Goody Powder or any medication containing Aspirin. Pt and son, Vicente Males, verbalized understanding of all pre-op instructions.

## 2016-11-20 ENCOUNTER — Encounter (HOSPITAL_COMMUNITY)
Admission: RE | Disposition: A | Discharge status: Home / Self Care | Payer: Self-pay | Source: Ambulatory Visit | Attending: Emergency Medicine

## 2016-11-20 ENCOUNTER — Encounter (HOSPITAL_COMMUNITY): Payer: Self-pay | Admitting: *Deleted

## 2016-11-20 ENCOUNTER — Ambulatory Visit (HOSPITAL_COMMUNITY): Payer: Medicare Other | Admitting: Certified Registered Nurse Anesthetist

## 2016-11-20 ENCOUNTER — Ambulatory Visit (HOSPITAL_COMMUNITY)
Admission: RE | Admit: 2016-11-20 | Discharge: 2016-11-20 | Disposition: A | Discharge status: Home / Self Care | Payer: Medicare Other | Source: Ambulatory Visit | Attending: Emergency Medicine | Admitting: Emergency Medicine

## 2016-11-20 DIAGNOSIS — J439 Emphysema, unspecified: Secondary | ICD-10-CM | POA: Insufficient documentation

## 2016-11-20 DIAGNOSIS — G4730 Sleep apnea, unspecified: Secondary | ICD-10-CM | POA: Diagnosis not present

## 2016-11-20 DIAGNOSIS — M1990 Unspecified osteoarthritis, unspecified site: Secondary | ICD-10-CM | POA: Diagnosis not present

## 2016-11-20 DIAGNOSIS — R918 Other nonspecific abnormal finding of lung field: Secondary | ICD-10-CM

## 2016-11-20 DIAGNOSIS — E785 Hyperlipidemia, unspecified: Secondary | ICD-10-CM | POA: Insufficient documentation

## 2016-11-20 DIAGNOSIS — Z7982 Long term (current) use of aspirin: Secondary | ICD-10-CM | POA: Diagnosis not present

## 2016-11-20 DIAGNOSIS — K210 Gastro-esophageal reflux disease with esophagitis: Secondary | ICD-10-CM | POA: Insufficient documentation

## 2016-11-20 DIAGNOSIS — I2510 Atherosclerotic heart disease of native coronary artery without angina pectoris: Secondary | ICD-10-CM | POA: Insufficient documentation

## 2016-11-20 DIAGNOSIS — E559 Vitamin D deficiency, unspecified: Secondary | ICD-10-CM | POA: Diagnosis not present

## 2016-11-20 DIAGNOSIS — I10 Essential (primary) hypertension: Secondary | ICD-10-CM | POA: Diagnosis not present

## 2016-11-20 DIAGNOSIS — C3432 Malignant neoplasm of lower lobe, left bronchus or lung: Secondary | ICD-10-CM | POA: Insufficient documentation

## 2016-11-20 DIAGNOSIS — Z87891 Personal history of nicotine dependence: Secondary | ICD-10-CM | POA: Diagnosis not present

## 2016-11-20 DIAGNOSIS — F329 Major depressive disorder, single episode, unspecified: Secondary | ICD-10-CM | POA: Diagnosis not present

## 2016-11-20 DIAGNOSIS — I700 Atherosclerosis of aorta: Secondary | ICD-10-CM | POA: Diagnosis not present

## 2016-11-20 DIAGNOSIS — N400 Enlarged prostate without lower urinary tract symptoms: Secondary | ICD-10-CM | POA: Insufficient documentation

## 2016-11-20 DIAGNOSIS — Z888 Allergy status to other drugs, medicaments and biological substances status: Secondary | ICD-10-CM | POA: Insufficient documentation

## 2016-11-20 DIAGNOSIS — Z85828 Personal history of other malignant neoplasm of skin: Secondary | ICD-10-CM | POA: Insufficient documentation

## 2016-11-20 HISTORY — DX: Sleep apnea, unspecified: G47.30

## 2016-11-20 HISTORY — DX: Angina pectoris, unspecified: I20.9

## 2016-11-20 HISTORY — DX: Headache: R51

## 2016-11-20 HISTORY — PX: VIDEO BRONCHOSCOPY WITH ENDOBRONCHIAL ULTRASOUND: SHX6177

## 2016-11-20 HISTORY — DX: Gastro-esophageal reflux disease without esophagitis: K21.9

## 2016-11-20 HISTORY — DX: Headache, unspecified: R51.9

## 2016-11-20 HISTORY — DX: Pneumonia, unspecified organism: J18.9

## 2016-11-20 LAB — COMPREHENSIVE METABOLIC PANEL
ALT: 11 U/L — AB (ref 17–63)
AST: 20 U/L (ref 15–41)
Albumin: 3 g/dL — ABNORMAL LOW (ref 3.5–5.0)
Alkaline Phosphatase: 59 U/L (ref 38–126)
Anion gap: 8 (ref 5–15)
BUN: 14 mg/dL (ref 6–20)
CHLORIDE: 105 mmol/L (ref 101–111)
CO2: 27 mmol/L (ref 22–32)
CREATININE: 1.25 mg/dL — AB (ref 0.61–1.24)
Calcium: 8.7 mg/dL — ABNORMAL LOW (ref 8.9–10.3)
GFR calc non Af Amer: 53 mL/min — ABNORMAL LOW (ref >60)
Glucose, Bld: 104 mg/dL — ABNORMAL HIGH (ref 65–99)
Potassium: 3.9 mmol/L (ref 3.5–5.1)
SODIUM: 140 mmol/L (ref 135–145)
Total Bilirubin: 0.7 mg/dL (ref 0.3–1.2)
Total Protein: 6 g/dL — ABNORMAL LOW (ref 6.5–8.1)

## 2016-11-20 LAB — PROTIME-INR
INR: 1.23
Prothrombin Time: 15.6 seconds — ABNORMAL HIGH (ref 11.4–15.2)

## 2016-11-20 LAB — CBC
HCT: 35.4 % — ABNORMAL LOW (ref 39.0–52.0)
HEMOGLOBIN: 10.6 g/dL — AB (ref 13.0–17.0)
MCH: 26.3 pg (ref 26.0–34.0)
MCHC: 29.9 g/dL — ABNORMAL LOW (ref 30.0–36.0)
MCV: 87.8 fL (ref 78.0–100.0)
PLATELETS: 198 10*3/uL (ref 150–400)
RBC: 4.03 MIL/uL — AB (ref 4.22–5.81)
RDW: 15 % (ref 11.5–15.5)
WBC: 10.2 10*3/uL (ref 4.0–10.5)

## 2016-11-20 LAB — APTT: APTT: 31 s (ref 24–36)

## 2016-11-20 SURGERY — BRONCHOSCOPY, WITH EBUS
Anesthesia: General

## 2016-11-20 MED ORDER — FENTANYL CITRATE (PF) 100 MCG/2ML IJ SOLN
INTRAMUSCULAR | Status: AC
  Filled 2016-11-20: qty 2

## 2016-11-20 MED ORDER — PHENYLEPHRINE HCL 10 MG/ML IJ SOLN
INTRAVENOUS | Status: DC | PRN
  Administered 2016-11-20: 40 ug/min via INTRAVENOUS

## 2016-11-20 MED ORDER — LIDOCAINE HCL (CARDIAC) 20 MG/ML IV SOLN
INTRAVENOUS | Status: DC | PRN
  Administered 2016-11-20: 60 mg via INTRAVENOUS

## 2016-11-20 MED ORDER — LIDOCAINE HCL 4 % EX SOLN
CUTANEOUS | Status: DC | PRN
  Administered 2016-11-20: 2 mL via TOPICAL

## 2016-11-20 MED ORDER — PROPOFOL 10 MG/ML IV BOLUS
INTRAVENOUS | Status: DC | PRN
  Administered 2016-11-20: 150 mg via INTRAVENOUS
  Administered 2016-11-20: 30 mg via INTRAVENOUS

## 2016-11-20 MED ORDER — OXYCODONE HCL 5 MG/5ML PO SOLN: 5.0000 mg | Freq: Once | ORAL | Status: DC | PRN

## 2016-11-20 MED ORDER — MIDAZOLAM HCL 2 MG/2ML IJ SOLN
INTRAMUSCULAR | Status: AC
  Filled 2016-11-20: qty 2

## 2016-11-20 MED ORDER — OXYCODONE HCL 5 MG PO TABS: 5.0000 mg | ORAL_TABLET | Freq: Once | ORAL | Status: DC | PRN

## 2016-11-20 MED ORDER — SUGAMMADEX SODIUM 200 MG/2ML IV SOLN
INTRAVENOUS | Status: AC
  Filled 2016-11-20: qty 2

## 2016-11-20 MED ORDER — ARTIFICIAL TEARS OP OINT
TOPICAL_OINTMENT | OPHTHALMIC | Status: AC
  Filled 2016-11-20: qty 3.5

## 2016-11-20 MED ORDER — ONDANSETRON HCL 4 MG/2ML IJ SOLN
INTRAMUSCULAR | Status: DC | PRN
  Administered 2016-11-20: 4 mg via INTRAVENOUS

## 2016-11-20 MED ORDER — LACTATED RINGERS IV SOLN
INTRAVENOUS | Status: DC
  Administered 2016-11-20 (×3): via INTRAVENOUS

## 2016-11-20 MED ORDER — MIDAZOLAM HCL 5 MG/5ML IJ SOLN
INTRAMUSCULAR | Status: DC | PRN
  Administered 2016-11-20: .5 mg via INTRAVENOUS

## 2016-11-20 MED ORDER — LIDOCAINE 2% (20 MG/ML) 5 ML SYRINGE
INTRAMUSCULAR | Status: AC
  Filled 2016-11-20: qty 10

## 2016-11-20 MED ORDER — FENTANYL CITRATE (PF) 100 MCG/2ML IJ SOLN: 25.0000 ug | INTRAMUSCULAR | Status: DC | PRN

## 2016-11-20 MED ORDER — ROCURONIUM BROMIDE 50 MG/5ML IV SOSY
PREFILLED_SYRINGE | INTRAVENOUS | Status: AC
  Filled 2016-11-20: qty 5

## 2016-11-20 MED ORDER — FENTANYL CITRATE (PF) 100 MCG/2ML IJ SOLN
INTRAMUSCULAR | Status: DC | PRN
  Administered 2016-11-20 (×2): 50 ug via INTRAVENOUS

## 2016-11-20 MED ORDER — ONDANSETRON HCL 4 MG/2ML IJ SOLN: 4.0000 mg | Freq: Four times a day (QID) | INTRAMUSCULAR | Status: DC | PRN

## 2016-11-20 MED ORDER — ONDANSETRON HCL 4 MG/2ML IJ SOLN
INTRAMUSCULAR | Status: AC
  Filled 2016-11-20: qty 2

## 2016-11-20 MED ORDER — PROPOFOL 10 MG/ML IV BOLUS
INTRAVENOUS | Status: AC
  Filled 2016-11-20: qty 20

## 2016-11-20 MED ORDER — SUGAMMADEX SODIUM 200 MG/2ML IV SOLN
INTRAVENOUS | Status: DC | PRN
  Administered 2016-11-20: 200 mg via INTRAVENOUS

## 2016-11-20 SURGICAL SUPPLY — 33 items
BRUSH CYTOL CELLEBRITY 1.5X140 (MISCELLANEOUS) ×6 IMPLANT
CANISTER SUCTION 2500CC (MISCELLANEOUS) ×3 IMPLANT
CONT SPEC 4OZ CLIKSEAL STRL BL (MISCELLANEOUS) ×3 IMPLANT
COVER DOME SNAP 22 D (MISCELLANEOUS) ×3 IMPLANT
COVER TABLE BACK 60X90 (DRAPES) ×3 IMPLANT
FORCEPS BIOP RJ4 1.8 (CUTTING FORCEPS) ×3 IMPLANT
GAUZE SPONGE 4X4 12PLY STRL (GAUZE/BANDAGES/DRESSINGS) ×3 IMPLANT
GLOVE BIO SURGEON STRL SZ7.5 (GLOVE) ×3 IMPLANT
GLOVE SURG SS PI 6.5 STRL IVOR (GLOVE) IMPLANT
GLOVE SURG SS PI 7.0 STRL IVOR (GLOVE) ×3 IMPLANT
GLOVE SURG SS PI 7.5 STRL IVOR (GLOVE) ×3 IMPLANT
GOWN STRL REUS W/ TWL LRG LVL3 (GOWN DISPOSABLE) ×2 IMPLANT
GOWN STRL REUS W/ TWL XL LVL3 (GOWN DISPOSABLE) ×1 IMPLANT
GOWN STRL REUS W/TWL LRG LVL3 (GOWN DISPOSABLE) ×4
GOWN STRL REUS W/TWL XL LVL3 (GOWN DISPOSABLE) ×2
KIT CLEAN ENDO COMPLIANCE (KITS) ×6 IMPLANT
KIT ROOM TURNOVER OR (KITS) ×3 IMPLANT
MARKER SKIN DUAL TIP RULER LAB (MISCELLANEOUS) ×3 IMPLANT
NEEDLE BIOPSY TRANSBRONCH 21G (NEEDLE) IMPLANT
NEEDLE EBUS SONO TIP PENTAX (NEEDLE) ×3 IMPLANT
NS IRRIG 1000ML POUR BTL (IV SOLUTION) ×3 IMPLANT
OIL SILICONE PENTAX (PARTS (SERVICE/REPAIRS)) ×3 IMPLANT
PAD ARMBOARD 7.5X6 YLW CONV (MISCELLANEOUS) ×6 IMPLANT
SYR 20CC LL (SYRINGE) ×3 IMPLANT
SYR 20ML ECCENTRIC (SYRINGE) ×6 IMPLANT
SYR 50ML LL SCALE MARK (SYRINGE) IMPLANT
SYR 50ML SLIP (SYRINGE) IMPLANT
SYR 5ML LUER SLIP (SYRINGE) ×3 IMPLANT
TOWEL OR 17X24 6PK STRL BLUE (TOWEL DISPOSABLE) IMPLANT
TRAP SPECIMEN MUCOUS 40CC (MISCELLANEOUS) IMPLANT
TUBE CONNECTING 20'X1/4 (TUBING) ×2
TUBE CONNECTING 20X1/4 (TUBING) ×4 IMPLANT
WATER STERILE IRR 1000ML POUR (IV SOLUTION) ×3 IMPLANT

## 2016-11-20 NOTE — Anesthesia Procedure Notes (Signed)
Procedure Name: Intubation Date/Time: 11/20/2016 9:59 AM Performed by: Izora Gala Pre-anesthesia Checklist: Patient identified Patient Re-evaluated:Patient Re-evaluated prior to inductionOxygen Delivery Method: Circle system utilized Preoxygenation: Pre-oxygenation with 100% oxygen Intubation Type: IV induction Ventilation: Mask ventilation without difficulty Laryngoscope Size: Miller and 3 Grade View: Grade I Tube type: Oral Tube size: 8.5 mm Number of attempts: 1 Airway Equipment and Method: Stylet and LTA kit utilized Placement Confirmation: ETT inserted through vocal cords under direct vision,  positive ETCO2 and breath sounds checked- equal and bilateral Secured at: 22 cm Tube secured with: Tape Dental Injury: Teeth and Oropharynx as per pre-operative assessment

## 2016-11-20 NOTE — H&P (View-Only) (Signed)
Subjective:    Patient ID: Christian Munoz, male    DOB: 09/20/1935, 82 y.o.   MRN: [id number]  HPI 74 Former smoker with a history of coronary disease last cath was '95, several squamous cell skin cancers, prostatic hypertrophy, macular degeneration. He is referred for an abnormal CT scan chest. He tells me that he started to have L back and flank pleuritic pain that started about two weeks ago. He was treated for PNA at the same time. Ct chest was done that I have reviewed >> shows for pulmonary embolism, there is a left infrahilar rounded opacity surrounds the segmental pulmonary artery and vein.  L lower lobe bronchus appears to be encased and narrowed by external compression, unclear whether there is an endobronchial lesion. There is a small L effusion. The proximal mass extends out to a more peripheral rounded satellite lesion. The entire picture looks like primary lung CA  Review of Systems  Constitutional: Positive for unexpected weight change. Negative for fever.  HENT: Positive for rhinorrhea and sneezing. Negative for congestion, dental problem, ear pain, nosebleeds, postnasal drip, sinus pressure, sore throat and trouble swallowing.   Eyes: Negative.  Negative for redness and itching.  Respiratory: Positive for cough. Negative for chest tightness, shortness of breath and wheezing.   Cardiovascular: Negative.  Negative for palpitations and leg swelling.  Gastrointestinal: Negative.  Negative for nausea and vomiting.  Genitourinary: Negative.  Negative for dysuria.  Musculoskeletal: Negative.  Negative for joint swelling.  Skin: Negative.  Negative for rash.  Neurological: Negative.  Negative for headaches.  Hematological: Negative.  Does not bruise/bleed easily.  Psychiatric/Behavioral: Negative.  Negative for dysphoric mood. The patient is not nervous/anxious.    Past Medical History:  Diagnosis Date  . Allergic rhinitis due to pollen   . Coronary atherosclerosis of native  coronary artery   . Coronary atherosclerosis of native coronary artery   . Depressive disorder, not elsewhere classified   . Herpes genitalia   . Hypertrophy of prostate without urinary obstruction and other lower urinary tract symptoms (LUTS)   . Hypertrophy of prostate without urinary obstruction and other lower urinary tract symptoms (LUTS)   . Lack of coordination   . Lack of coordination   . Lumbago   . Osteoarthrosis, unspecified whether generalized or localized, unspecified site   . Osteoarthrosis, unspecified whether generalized or localized, unspecified site   . Other and unspecified hyperlipidemia   . Other malaise and fatigue   . Reflux esophagitis   . Skin cancer   . Syncope and collapse   . Urinary frequency   . Vitamin D deficiency      Family History  Problem Relation Age of Onset  . Heart disease Father   . Cancer Father   . Cancer Brother   . Cancer Brother      Social History   Social History  . Marital status: Married    Spouse name: N/A  . Number of children: N/A  . Years of education: N/A   Occupational History  . Not on file.   Social History Main Topics  . Smoking status: Former Smoker    Types: Cigarettes    Quit date: 08/12/2016  . Smokeless tobacco: Never Used     Comment: 3 per day  . Alcohol use No  . Drug use: No  . Sexual activity: Not Currently   Other Topics Concern  . Not on file   Social History Narrative  . 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   Allergies  Allergen Reactions  . Chantix [Varenicline]     Makes patient suicidal  . Zoloft [Sertraline Hcl]     Makes patient suicidal     Outpatient Medications Prior to Visit  Medication Sig Dispense Refill  . aspirin 81 MG tablet Take 81 mg by mouth daily. Take one tablet once a day    . calcium carbonate (OS-CAL) 600 MG TABS Take 600 mg by mouth 2 (two) times daily with a meal. Take one tablet once a day for  calcium supplement    . escitalopram (LEXAPRO) 5 MG tablet Take 1 tablet (5 mg total) by mouth every morning. 30 tablet 1  . fish oil-omega-3 fatty acids 1000 MG capsule Take 2 g by mouth daily. Take one tablet twice a day    . Glucosamine-Chondroit-Vit C-Mn (GLUCOSAMINE 1500 COMPLEX) CAPS Take 2 capsules by mouth daily.    . metoprolol tartrate (LOPRESSOR) 25 MG tablet TAKE 1 TABLET BY MOUTH TWICE DAILY 180 tablet 1  . Multiple Vitamins-Minerals (PRESERVISION AREDS 2) CAPS Take 2 capsules by mouth daily.    . pantoprazole (PROTONIX) 20 MG tablet TAKE 1 TABLET(20 MG) BY MOUTH DAILY 90 tablet 1  . QUEtiapine (SEROQUEL) 25 MG tablet Take 1 tablet (25 mg total) by mouth at bedtime. 1/2- 1 pill po qhs 30 tablet 1  . simvastatin (ZOCOR) 20 MG tablet TAKE 1 TABLET(20 MG) BY MOUTH DAILY 90 tablet 2  . tamsulosin (FLOMAX) 0.4 MG CAPS capsule TAKE 1 CAPSULE(0.4 MG) BY MOUTH DAILY 90 capsule 1  . amoxicillin (AMOXIL) 500 MG capsule Take 1 capsule (500 mg total) by mouth 3 (three) times daily. (Patient not taking: Reported on 11/13/2016) 21 capsule 0  . azithromycin (ZITHROMAX) 250 MG tablet Take 1 tablet (250 mg total) by mouth daily. Take first 2 tablets together, then 1 every day until finished. (Patient not taking: Reported on 11/13/2016) 6 tablet 0  . triamcinolone cream (KENALOG) 0.1 % Apply 1 application topically 2 (two) times daily.     No facility-administered medications prior to visit.         Objective:   Physical Exam Vitals:   11/13/16 1130  BP: 110/80  Pulse: 83  SpO2: 95%  Weight: 157 lb 9.6 oz (71.5 kg)  Height: '5\' 10"'$  (1.778 m)   Gen: Pleasant, well-nourished, in no distress,  normal affect  ENT: No lesions,  mouth clear,  oropharynx clear, no postnasal drip, poor vision, hearing aides  Neck: No JVD, no stridor  Lungs: No use of accessory muscles, decreased on the L, clear without rales or rhonchi  Cardiovascular: RRR, heart sounds normal, no murmur or gallops, no peripheral  edema  Musculoskeletal: No deformities, no cyanosis or clubbing  Neuro: alert, non focal  Skin: Warm, no lesions or rashes   CT chest 10/23/16 --  COMPARISON:  10/23/2016 chest radiograph.  FINDINGS: Cardiovascular: Satisfactory opacification of the pulmonary arteries to the segmental level. No evidence of pulmonary embolism. Normal heart size. Moderate coronary artery calcification. Normal caliber thoracic aorta. Mild aortic atherosclerosis with calcification.  Mediastinum/Nodes: Sub- carinal lymphadenopathy measuring up to 13 mm short axis. Left infrahilar lymph nodes are confluent with the masslike opacification in the infrahilar region.  Lungs/Pleura: There is dense masslike left infrahilar and lower lobe consolidation measuring up to 5.6 x 6.8 cm (AP by ML, series 401, image 63) there are ground-glass opacities throughout the rest of  the left lower lobe and several nodular foci for example near the superior segment of left lower lobe measuring up to 26 mm at the periphery (series 407, image 63). There is tapering occlusion of a left lower lobe segmental bronchus (series 404, image 58) and of a left lower lobe segmental pulmonary artery (series 404, image 62) near the hilum. Additionally, there is occlusion of the left inferior pulmonary vein.  Moderate centrilobular emphysema of the lungs.  There is a moderate left-sided pleural effusion.  Upper Abdomen: Numerous fluid attenuating lucencies throughout the liver, some too small to characterize, probably representing multiple liver cysts measuring up to 4.5 mm.  Musculoskeletal: No acute osseous abnormality.  Review of the MIP images confirms the above findings.  IMPRESSION: 1. No evidence for pulmonary embolus. 2. Masslike opacity in the left infrahilar region with occlusion of segmental bronchi, a segmental pulmonary artery, and the left inferior pulmonary vein. Findings probably represent  underlying bronchogenic carcinoma. 3. Additional ground-glass opacities and nodules throughout the left lower lobe may represent postobstructive pneumonitis and/or lymphangitic carcinomatosis. 4. Subcarinal lymphadenopathy. 5. Moderate emphysema. 6. Moderate coronary artery calcification and mild aortic atherosclerosis. 7. Multiple lucencies throughout the liver with fluid attenuation likely cysts, some too small to characterize.      Assessment & Plan:  Lung mass Patient presents with left pleuritic pain and a CT scan of the chest that now identifies a left parahilar mass and a smaller more peripheral satellite lesion. Most consistent with primary lung CA. There is some compression of the LLL bronchus, ? Whether there is an endobronchial lesion. He does have subcarinal LAD. I believe most straightforward approach would be FOB + EBUS to biopsy. He has a hx CAD, will check an ECG now. If normal then will go ahead and schedule. If any abnormality then will request a cardiology eval prior to general anesthesia.   Baltazar Apo, MD, PhD 11/13/2016, 12:14 PM [facility] [phone] or if no answer [phone]

## 2016-11-20 NOTE — Discharge Instructions (Signed)
Flexible Bronchoscopy, Care After These instructions give you information on caring for yourself after your procedure. Your doctor may also give you more specific instructions. Call your doctor if you have any problems or questions after your procedure. Follow these instructions at home:  Do not eat or drink anything for 2 hours after your procedure. If you try to eat or drink before the medicine wears off, food or drink could go into your lungs. You could also burn yourself.  After 2 hours have passed and when you can cough and gag normally, you may eat soft food and drink liquids slowly.  The day after the test, you may eat your normal diet.  You may do your normal activities.  Keep all doctor visits. Get help right away if:  You get more and more short of breath.  You get light-headed.  You feel like you are going to pass out (faint).  You have chest pain.  You have new problems that worry you.  You cough up more than a little blood.  You cough up more blood than before. This information is not intended to replace advice given to you by your health care provider. Make sure you discuss any questions you have with your health care provider. Document Released: 07/26/2009 Document Revised: 03/05/2016 Document Reviewed: 06/02/2013 Elsevier Interactive Patient Education  2017 [employer]. [phone].

## 2016-11-20 NOTE — Anesthesia Preprocedure Evaluation (Signed)
Anesthesia Evaluation  Patient identified by MRN, date of birth, ID band Patient awake    Reviewed: Allergy & Precautions, H&P , NPO status , Patient's Chart, lab work & pertinent test results  Airway Mallampati: II   Neck ROM: full    Dental   Pulmonary sleep apnea , pneumonia, former smoker,    breath sounds clear to auscultation       Cardiovascular hypertension, + angina + CAD   Rhythm:regular Rate:Normal     Neuro/Psych  Headaches, PSYCHIATRIC DISORDERS Depression    GI/Hepatic GERD  ,  Endo/Other    Renal/GU      Musculoskeletal  (+) Arthritis ,   Abdominal   Peds  Hematology   Anesthesia Other Findings   Reproductive/Obstetrics                             Anesthesia Physical Anesthesia Plan  ASA: III  Anesthesia Plan: General   Post-op Pain Management:    Induction: Intravenous  Airway Management Planned: Oral ETT  Additional Equipment:   Intra-op Plan:   Post-operative Plan: Extubation in OR  Informed Consent: I have reviewed the patients History and Physical, chart, labs and discussed the procedure including the risks, benefits and alternatives for the proposed anesthesia with the patient or authorized representative who has indicated his/her understanding and acceptance.     Plan Discussed with: CRNA, Anesthesiologist and Surgeon  Anesthesia Plan Comments:         Anesthesia Quick Evaluation

## 2016-11-20 NOTE — Op Note (Signed)
Video Bronchoscopy Procedure Note  Date of Operation: 11/20/2016  Pre-op Diagnosis: Left lower lobe mass  Post-op Diagnosis: Same  Surgeon: Baltazar Apo  Assistants: none  Anesthesia: General anesthesia with intubation and ventilation  Operation: Flexible video fiberoptic bronchoscopy and biopsies.  Estimated Blood Loss: 93AT  Complications: none noted  Indications and History: Christian Munoz is 82 y.o. with history of tobacco use, found to have a LLL mass with hilar involvement.  Recommendation was to perform video fiberoptic bronchoscopy with biopsies, possible EBUS depending on the endobronchial findings. The risks, benefits, complications, treatment options and expected outcomes were discussed with the patient.  The possibilities of pneumothorax, pneumonia, reaction to medication, pulmonary aspiration, perforation of a viscus, bleeding, failure to diagnose a condition and creating a complication requiring transfusion or operation were discussed with the patient who freely signed the consent.    Description of Procedure: The patient was seen in the Preoperative Area, was examined and was deemed appropriate to proceed.  The patient was taken to OR 10, identified as Christian Munoz and the procedure verified as Flexible Video Fiberoptic Bronchoscopy.  A Time Out was held and the above information confirmed. He was intubated and supported by Anesthesia for the entire case.   The video fiberoptic bronchoscope was introduced via the ETT and a general inspection was performed which normal trachea, normal main carina. The R sided airways were inspected and showed normal RUL, BI, RML and RLL. The left mainstem bronchus was normal. The left upper lobe carina was slightly splayed and edematous. The left upper lobe airways were all inspected and were normal. The left lower lobe airways were abnormal. There was a friable exophytic white mass completely occluding the superior segmental airway.  The other left lower lobe airways were erythematous with some vascular hyperplasia. Also noted was some less conspicuous white raised mucosa consistent with malignancy. Both of these areas, the superior segment and also the more distal left lower lobe airways, were brushed for cytology. Endobronchial biopsies were then performed with a forceps on the exophytic mass occluding the superior segment. These were sent for pathology. Quick stain evaluation of the superior segmental brushings was consistent with a high-grade carcinoma. Because a diagnosis was obtained the endobronchial ultrasound was deferred and no needle biopsies were taken.  There was some initial moderate bleeding from the superior segmental lesion that was stopping by the end of the case. The patient tolerated the procedure well. The bronchoscope was removed. There were no obvious complications. He will be waked from anesthesia and will go to the PACU for recovery.   Samples: 1. Endobronchial brushings from LLL Superior segmental mass 2. Endobronchial brushings from LLL Basal airways 3. Endobronchial forceps biopsies from LLL Superior segmental mass  Plans:  We will review the cytology, pathology results with the patient when they become available.  Outpatient followup will be with Dr Lamonte Sakai and with Joelene Millin, NP.    Baltazar Apo, MD, PhD 11/20/2016, 10:49 AM [facility] [phone] or if no answer [phone]

## 2016-11-20 NOTE — Transfer of Care (Signed)
Immediate Anesthesia Transfer of Care Note  Patient: Christian Munoz  Procedure(s) Performed: Procedure(s): VIDEO BRONCHOSCOPY WITH ENDOBRONCHIAL ULTRASOUND (N/A)  Patient Location: PACU  Anesthesia Type:General  Level of Consciousness: awake, alert , oriented and patient cooperative  Airway & Oxygen Therapy: Patient Spontanous Breathing and Patient connected to nasal cannula oxygen  Post-op Assessment: Report given to RN, Post -op Vital signs reviewed and stable, Patient moving all extremities and Patient moving all extremities X 4  Post vital signs: Reviewed and stable  Last Vitals:  Vitals:   11/20/16 0744  BP: 110/67  Pulse: 73  Resp: 18  Temp: 36.8 C    Last Pain:  Vitals:   11/20/16 0744  TempSrc: Oral      Patients Stated Pain Goal: 2 (58/85/02 7741)  Complications: No apparent anesthesia complications

## 2016-11-20 NOTE — Interval H&P Note (Signed)
PCCM Interval Note  Pt presents today for further eval of his LLL mass, LAD.  He reports that he is doing well, has had no new issues, denies SOB or cough. No CP No new meds started, not on anticoagulation.  Discussed procedure with him today, risks and benefits. He understands and has signed the consent form. Elects to proceed.   Vitals:   11/20/16 0738 11/20/16 0744  BP:  110/67  Pulse:  73  Resp:  18  Temp:  98.2 F (36.8 C)  TempSrc:  Oral  SpO2:  99%  Weight: 71.2 kg (157 lb)   Height: '5\' 10"'$  (1.778 m)   Gen: Pleasant, elderly man, in no distress,  normal affect  ENT: No lesions,  mouth clear,  oropharynx clear, no postnasal drip, poor hearing  Neck: No JVD, no stridor  Lungs: No use of accessory muscles, some bronchial BS on the L, otherwise clear  Cardiovascular: RRR, heart sounds normal, no murmur or gallops, no peripheral edema  Musculoskeletal: No deformities, no cyanosis or clubbing  Neuro: alert, non focal  Skin: Warm, no lesions or rashes   Plans:  Plan for FOB with inspection of LLL bronchus. EBUS planned if no endobronchial lesion seen.   Baltazar Apo, MD, PhD 11/20/2016, 9:21 AM [facility] [phone] or if no answer [phone]

## 2016-11-20 NOTE — Anesthesia Postprocedure Evaluation (Signed)
Anesthesia Post Note  Patient: Christian Munoz  Procedure(s) Performed: Procedure(s) (LRB): VIDEO BRONCHOSCOPY WITH ENDOBRONCHIAL ULTRASOUND (N/A)  Patient location during evaluation: PACU Anesthesia Type: General Level of consciousness: awake and alert and patient cooperative Pain management: pain level controlled Vital Signs Assessment: post-procedure vital signs reviewed and stable Respiratory status: spontaneous breathing and respiratory function stable Cardiovascular status: stable Anesthetic complications: no       Last Vitals:  Vitals:   11/20/16 1145 11/20/16 1200  BP:  (!) 91/55  Pulse: 61 (!) 57  Resp: 17 14  Temp:      Last Pain:  Vitals:   11/20/16 1105  TempSrc:   PainSc: 0-No pain                 [name]

## 2016-11-21 ENCOUNTER — Encounter (HOSPITAL_COMMUNITY): Payer: Self-pay | Admitting: Emergency Medicine

## 2016-11-24 ENCOUNTER — Telehealth: Payer: Self-pay | Admitting: Emergency Medicine

## 2016-11-24 DIAGNOSIS — C3490 Malignant neoplasm of unspecified part of unspecified bronchus or lung: Secondary | ICD-10-CM

## 2016-11-24 NOTE — Telephone Encounter (Signed)
Called and spoke with pts son and he is aware that we will send this over to [place] to review and send the results back to [place].  He stated to call his dad at [phone].  RB please advise. Thanks   Allergies  Allergen Reactions  . Chantix [Varenicline] Other (See Comments)    Makes patient suicidal  . Zoloft [Sertraline Hcl] Other (See Comments)    Makes patient suicidal

## 2016-11-25 NOTE — Telephone Encounter (Signed)
Spoke with the patient today. Tissue bx's show SCLCS. Suspect limited stage based on existing imaging. He needs a PET Scan and I will order. I will refer him to [place] at [facility].

## 2016-11-25 NOTE — Telephone Encounter (Signed)
RB please advise on results. Thanks.

## 2016-11-25 NOTE — Telephone Encounter (Signed)
Pt son calling back again about results, please call father @ [phone].Dorothyann Peng A Dalton\

## 2016-11-26 ENCOUNTER — Encounter: Payer: Self-pay | Admitting: *Deleted

## 2016-11-26 ENCOUNTER — Telehealth: Payer: Self-pay | Admitting: *Deleted

## 2016-11-26 ENCOUNTER — Other Ambulatory Visit: Payer: Self-pay | Admitting: Emergency Medicine

## 2016-11-26 DIAGNOSIS — R918 Other nonspecific abnormal finding of lung field: Secondary | ICD-10-CM

## 2016-11-26 DIAGNOSIS — C801 Malignant (primary) neoplasm, unspecified: Secondary | ICD-10-CM

## 2016-11-26 NOTE — Telephone Encounter (Signed)
Oncology Nurse Navigator Documentation  Oncology Nurse Navigator Flowsheets 11/26/2016  Navigator Location [facility]  Referral date to RadOnc/MedOnc 11/26/2016  Navigator Encounter Type Telephone/I received referral on Christian Munoz.  Appt given for 11/30/16 arrive at 1:15  Telephone Outgoing Call  Treatment Phase Pre-Tx/Tx Discussion  Barriers/Navigation Needs Coordination of Care  Interventions Coordination of Care  Acuity Level 1  Acuity Level 1 Initial guidance, education and coordination as needed  Time Spent with Patient 30

## 2016-11-26 NOTE — Progress Notes (Signed)
Oncology Nurse Navigator Documentation  Oncology Nurse Navigator Flowsheets 11/26/2016  Navigator Encounter Type Other/I updated Dr. Lamonte Sakai on cancer conference discussion today via EMR.  Treatment Phase Pre-Tx/Tx Discussion  Interventions Other  Acuity Level 1  Time Spent with Patient 15

## 2016-11-30 ENCOUNTER — Ambulatory Visit (HOSPITAL_BASED_OUTPATIENT_CLINIC_OR_DEPARTMENT_OTHER): Payer: Medicare Other | Admitting: Internal Medicine

## 2016-11-30 ENCOUNTER — Encounter: Payer: Self-pay | Admitting: Internal Medicine

## 2016-11-30 ENCOUNTER — Ambulatory Visit: Payer: Self-pay | Admitting: Internal Medicine

## 2016-11-30 ENCOUNTER — Other Ambulatory Visit: Payer: Self-pay | Admitting: Internal Medicine

## 2016-11-30 ENCOUNTER — Telehealth: Payer: Self-pay | Admitting: Internal Medicine

## 2016-11-30 ENCOUNTER — Other Ambulatory Visit (HOSPITAL_BASED_OUTPATIENT_CLINIC_OR_DEPARTMENT_OTHER): Payer: Medicare Other

## 2016-11-30 VITALS — BP 113/71 | HR 61 | Temp 98.1°F | Resp 17 | Ht 70.0 in | Wt 159.0 lb

## 2016-11-30 DIAGNOSIS — R630 Anorexia: Secondary | ICD-10-CM

## 2016-11-30 DIAGNOSIS — I10 Essential (primary) hypertension: Secondary | ICD-10-CM | POA: Diagnosis not present

## 2016-11-30 DIAGNOSIS — R918 Other nonspecific abnormal finding of lung field: Secondary | ICD-10-CM

## 2016-11-30 DIAGNOSIS — C3492 Malignant neoplasm of unspecified part of left bronchus or lung: Secondary | ICD-10-CM | POA: Insufficient documentation

## 2016-11-30 DIAGNOSIS — R112 Nausea with vomiting, unspecified: Secondary | ICD-10-CM | POA: Insufficient documentation

## 2016-11-30 DIAGNOSIS — Z7189 Other specified counseling: Secondary | ICD-10-CM | POA: Insufficient documentation

## 2016-11-30 DIAGNOSIS — C3432 Malignant neoplasm of lower lobe, left bronchus or lung: Secondary | ICD-10-CM

## 2016-11-30 DIAGNOSIS — R110 Nausea: Secondary | ICD-10-CM

## 2016-11-30 DIAGNOSIS — R634 Abnormal weight loss: Secondary | ICD-10-CM

## 2016-11-30 DIAGNOSIS — Z5111 Encounter for antineoplastic chemotherapy: Secondary | ICD-10-CM

## 2016-11-30 HISTORY — DX: Nausea with vomiting, unspecified: R11.2

## 2016-11-30 HISTORY — DX: Other specified counseling: Z71.89

## 2016-11-30 HISTORY — DX: Encounter for antineoplastic chemotherapy: Z51.11

## 2016-11-30 LAB — COMPREHENSIVE METABOLIC PANEL WITH GFR
ALT: 11 U/L (ref 0–55)
AST: 18 U/L (ref 5–34)
Albumin: 3.2 g/dL — ABNORMAL LOW (ref 3.5–5.0)
Alkaline Phosphatase: 82 U/L (ref 40–150)
Anion Gap: 9 meq/L (ref 3–11)
BUN: 15.3 mg/dL (ref 7.0–26.0)
CO2: 27 meq/L (ref 22–29)
Calcium: 9.5 mg/dL (ref 8.4–10.4)
Chloride: 105 meq/L (ref 98–109)
Creatinine: 1.1 mg/dL (ref 0.7–1.3)
EGFR: 65 ml/min/1.73 m2 — ABNORMAL LOW
Glucose: 109 mg/dL (ref 70–140)
Potassium: 4.4 meq/L (ref 3.5–5.1)
Sodium: 140 meq/L (ref 136–145)
Total Bilirubin: 0.35 mg/dL (ref 0.20–1.20)
Total Protein: 6.9 g/dL (ref 6.4–8.3)

## 2016-11-30 LAB — CBC WITH DIFFERENTIAL/PLATELET
BASO%: 0.2 % (ref 0.0–2.0)
Basophils Absolute: 0 10e3/uL (ref 0.0–0.1)
EOS%: 7.3 % — ABNORMAL HIGH (ref 0.0–7.0)
Eosinophils Absolute: 0.7 10e3/uL — ABNORMAL HIGH (ref 0.0–0.5)
HCT: 38.3 % — ABNORMAL LOW (ref 38.4–49.9)
HGB: 11.9 g/dL — ABNORMAL LOW (ref 13.0–17.1)
LYMPH%: 37.1 % (ref 14.0–49.0)
MCH: 27 pg — ABNORMAL LOW (ref 27.2–33.4)
MCHC: 31.1 g/dL — ABNORMAL LOW (ref 32.0–36.0)
MCV: 86.8 fL (ref 79.3–98.0)
MONO#: 0.6 10e3/uL (ref 0.1–0.9)
MONO%: 5.5 % (ref 0.0–14.0)
NEUT#: 5.1 10e3/uL (ref 1.5–6.5)
NEUT%: 49.9 % (ref 39.0–75.0)
Platelets: 229 10e3/uL (ref 140–400)
RBC: 4.41 10e6/uL (ref 4.20–5.82)
RDW: 15.1 % — ABNORMAL HIGH (ref 11.0–14.6)
WBC: 10.2 10e3/uL (ref 4.0–10.3)
lymph#: 3.8 10e3/uL — ABNORMAL HIGH (ref 0.9–3.3)

## 2016-11-30 MED ORDER — LIDOCAINE-PRILOCAINE 2.5-2.5 % EX CREA: 1.0000 "application " | TOPICAL_CREAM | CUTANEOUS | 0 refills | Status: DC | PRN

## 2016-11-30 MED ORDER — PROCHLORPERAZINE MALEATE 10 MG PO TABS: 10.0000 mg | ORAL_TABLET | Freq: Four times a day (QID) | ORAL | 0 refills | Status: DC | PRN

## 2016-11-30 NOTE — Progress Notes (Signed)
START ON PATHWAY REGIMEN - Small Cell Lung  LOS320: Etoposide 100 mg/m2 Days 1, 2, 3 + Carboplatin AUC=5 Day 1 q21 Days x 4 Cycles with Concurrent Radiation**    A cycle is every 21 days:     Etoposide (Toposar(R)) 100 mg/m2 in 500 mL NS IV over 2 hours days 1-3       Dose Mod: None     Carboplatin (Paraplatin(R)) AUC=5 in 500 mL NS IV over 1 hour day 1 only       Dose Mod: None         Additional Orders: * All AUC calculations intended to be used in [employer] formula  **Always confirm dose/schedule in your pharmacy ordering system**    Patient Characteristics: Limited Stage, First Line Stage Grouping: Limited AJCC T Category: T3 AJCC N Category: N2 AJCC M Category: M0 AJCC 8 Stage Grouping: IIIB Line of therapy: First Line Would you be surprised if this patient died  in the next year? I would NOT be surprised if this patient died in the next year  Intent of Therapy: Curative Intent, Not Discussed with Patient

## 2016-11-30 NOTE — Progress Notes (Signed)
[facility] Telephone:([phone]) [phone]   Fax:(336) [phone]  CONSULT NOTE  REFERRING PHYSICIAN: Dr. Baltazar Apo  REASON FOR CONSULTATION:  82 years old white male recently diagnosed with lung cancer.  HPI Christian Munoz is a 82 y.o. male was past medical history significant for squamous cell carcinoma of the skin, macular degeneration, benign prostatic hypertrophy, osteoarthritis, depression, GERD, vitamin D deficiency as well as long history of smoking. The patient mentions that in early January 2018 he started having pain in the left posterior chest area. He was seen by his primary care physician and was monitored for few days. His pain was getting worse and the patient had chest x-ray performed on 10/23/2016 and it showed left basilar atelectasis and pneumonia. He was sent to the emergency department at [facility] for evaluation and CT angiogram of the chest was performed on 10/23/2016 and it showed no evidence of pulmonary embolus. There was masslike opacity in the left infrahilar region measuring 5.6 x 6.8 cm with occlusion of the segmental bronchi, segmental pulmonary artery and left inferior pulmonary vein and the findings probably represent underlying bronchogenic carcinoma. There was additionally groundglass opacities and nodules throughout the left lower lobe suspicious for postobstructive pneumonitis and 4 lymphangitic carcinomatosis. The scan also showed subcarinal lymphadenopathy. The patient was referred to Dr. Lamonte Sakai and on 11/20/2016 he underwent bronchoscopy which showed friable exophytic right mass completely occluding the superior segmental airway. Endobronchial biopsies were performed. The final pathology ([id number]) was consistent with small cell carcinoma. Dr. Lamonte Sakai kindly referred the patient to me today for evaluation and recommendation regarding treatment of his condition. When seen today the patient denied having any chest pain but continues to  have shortness breath with exertion and productive cough with no hemoptysis. He also has intermittent nausea especially in the evening time. He has lack of appetite and lost around 17 pounds in the last 8 weeks. He has no headache or visual changes. Family history significant for father died at age 25 with cancer, to Brother had cancer 1 in cancer and the other one lung cancer and 2 sisters with breast cancer. The patient is single and has 3 children. He was accompanied today by his son Christian Munoz. He is a retired Pharmacist, hospital. He has a history of smoking more than one pack per day for over 40 years and quit 1 month ago. He has no history of alcohol or drug abuse.  HPI  Past Medical History:  Diagnosis Date  . Allergic rhinitis due to pollen   . Anginal pain ([place])    Past medical history " does not seem to be a problem at all now."  . Coronary atherosclerosis of native coronary artery   . Coronary atherosclerosis of native coronary artery   . Depressive disorder, not elsewhere classified   . GERD (gastroesophageal reflux disease)   . Headache    PMH: Migraines  . Herpes genitalia   . Hypertrophy of prostate without urinary obstruction and other lower urinary tract symptoms (LUTS)   . Hypertrophy of prostate without urinary obstruction and other lower urinary tract symptoms (LUTS)   . Lack of coordination   . Lack of coordination   . Lumbago   . Osteoarthrosis, unspecified whether generalized or localized, unspecified site   . Osteoarthrosis, unspecified whether generalized or localized, unspecified site   . Other and unspecified hyperlipidemia   . Other malaise and fatigue   . Pneumonia   . Reflux esophagitis   . Skin cancer   .  Sleep apnea    does not wear CPAP  . Syncope and collapse   . Urinary frequency   . Vitamin D deficiency     Past Surgical History:  Procedure Laterality Date  . CARDIAC CATHETERIZATION    . COLONOSCOPY    . EYE SURGERY    . poly vocal cords  1985  . SKIN  CANCER EXCISION  2011  . TONSILLECTOMY    . VASECTOMY    . VIDEO BRONCHOSCOPY WITH ENDOBRONCHIAL ULTRASOUND N/A 11/20/2016   Procedure: VIDEO BRONCHOSCOPY WITH ENDOBRONCHIAL ULTRASOUND;  Surgeon: Collene Gobble, MD;  Location: [facility] OR;  Service: Thoracic;  Laterality: N/A;    Family History  Problem Relation Age of Onset  . Heart disease Father   . Cancer Father   . Cancer Brother   . Cancer Brother     Social History Social History  Substance Use Topics  . Smoking status: Former Smoker    Types: Cigarettes    Quit date: 08/12/2016  . Smokeless tobacco: Never Used     Comment: 3 per day  . Alcohol use No    Allergies  Allergen Reactions  . Chantix [Varenicline] Other (See Comments)    Makes patient suicidal  . Zoloft [Sertraline Hcl] Other (See Comments)    Makes patient suicidal    Current Outpatient Prescriptions  Medication Sig Dispense Refill  . aspirin 81 MG tablet Take 81 mg by mouth daily. Take one tablet once a day    . escitalopram (LEXAPRO) 5 MG tablet Take 1 tablet (5 mg total) by mouth every morning. 30 tablet 0  . fish oil-omega-3 fatty acids 1000 MG capsule Take 2 g by mouth daily. Take one tablet twice a day    . Glucosamine-Chondroit-Vit C-Mn (GLUCOSAMINE 1500 COMPLEX) CAPS Take 2 capsules by mouth daily.    . metoprolol tartrate (LOPRESSOR) 25 MG tablet TAKE 1 TABLET BY MOUTH TWICE DAILY 180 tablet 1  . Multiple Vitamins-Minerals (MULTIVITAMIN WITH MINERALS) tablet Take 1 tablet by mouth daily.    . Multiple Vitamins-Minerals (PRESERVISION AREDS 2) CAPS Take 2 capsules by mouth daily.    . pantoprazole (PROTONIX) 20 MG tablet TAKE 1 TABLET(20 MG) BY MOUTH DAILY 90 tablet 1  . QUEtiapine (SEROQUEL) 25 MG tablet Take 1 tablet (25 mg total) by mouth at bedtime. 1/2- 1 pill po qhs 30 tablet 1  . simvastatin (ZOCOR) 20 MG tablet TAKE 1 TABLET(20 MG) BY MOUTH DAILY 90 tablet 2  . tamsulosin (FLOMAX) 0.4 MG CAPS capsule TAKE 1 CAPSULE(0.4 MG) BY MOUTH DAILY 90  capsule 1   No current facility-administered medications for this visit.     Review of Systems  Constitutional: positive for fatigue Eyes: negative Ears, nose, mouth, throat, and face: negative Respiratory: positive for cough, dyspnea on exertion and sputum Cardiovascular: negative Gastrointestinal: positive for nausea Genitourinary:negative Integument/breast: negative Hematologic/lymphatic: negative Musculoskeletal:negative Neurological: negative Behavioral/Psych: negative Endocrine: negative Allergic/Immunologic: negative  Physical Exam  IDP:OEUMP, healthy, no distress, well nourished, well developed and anxious SKIN: skin color, texture, turgor are normal, no rashes or significant lesions HEAD: Normocephalic, No masses, lesions, tenderness or abnormalities EYES: normal, PERRLA, Conjunctiva are pink and non-injected EARS: External ears normal, Canals clear OROPHARYNX:no exudate, no erythema and lips, buccal mucosa, and tongue normal  NECK: supple, no adenopathy, no JVD LYMPH:  no palpable lymphadenopathy, no hepatosplenomegaly LUNGS: decreased breath sounds HEART: regular rate & rhythm, no murmurs and no gallops ABDOMEN:abdomen soft, non-tender, normal bowel sounds and no masses or organomegaly BACK:  Back symmetric, no curvature., No CVA tenderness EXTREMITIES:no joint deformities, effusion, or inflammation, no edema, no skin discoloration  NEURO: alert & oriented x 3 with fluent speech, no focal motor/sensory deficits  PERFORMANCE STATUS: ECOG 1  LABORATORY DATA: Lab Results  Component Value Date   WBC 10.2 11/30/2016   HGB 11.9 (L) 11/30/2016   HCT 38.3 (L) 11/30/2016   MCV 86.8 11/30/2016   PLT 229 11/30/2016      Chemistry      Component Value Date/Time   NA 140 11/30/2016 1341   K 4.4 11/30/2016 1341   CL 105 11/20/2016 0800   CO2 27 11/30/2016 1341   BUN 15.3 11/30/2016 1341   CREATININE 1.1 11/30/2016 1341      Component Value Date/Time   CALCIUM  9.5 11/30/2016 1341   ALKPHOS 82 11/30/2016 1341   AST 18 11/30/2016 1341   ALT 11 11/30/2016 1341   BILITOT 0.35 11/30/2016 1341       RADIOGRAPHIC STUDIES: No results found.  ASSESSMENT:This is a very pleasant 82 years old white male with small cell lung cancer, questionable limited stage (T3, N2, M0) pending further staging workup diagnosed in February 2018 and presented with left lower lobe lung mass in addition to subcarinal lymphadenopathy.   PLAN: I had a lengthy discussion with the patient and his son today about his current disease stage, prognosis and treatment options. I personally and independently reviewed the scan images and discuss the results and showed the images to the patient and his son today. I recommended for the patient to complete the staging workup by ordering a MRI of the brain to rule out brain metastasis. The patient also has a PET scan scheduled to be performed on 12/03/2016. I discussed with the patient several options for management of his condition including palliative care and hospice referral versus consideration of systemic chemotherapy with carboplatin for AUC of 5 on day 1 and etoposide 100 MG/M2 on days 1, 2 and 3 with Neulasta support. This will be concurrent with radiotherapy if the patient has no evidence for metastatic disease. I would not consider treating this patient with cisplatin because of the toxicity. I discussed with the patient adverse effect of this treatment including but not limited to alopecia, myelosuppression, nausea and vomiting, peripheral neuropathy, liver or renal dysfunction. He is expected to start the first dose of this treatment next week. I referred the patient to interventional radiology for consideration of Port-A-Cath placement. I ordered Emla cream to be applied to the Port-A-Cath site before treatment. I will also arrange for the patient to have a chemotherapy education class before the first dose of his treatment. For  nausea, I will call his pharmacy with prescription for Compazine 10 mg by mouth every 6 hours as needed for nausea For hypertension, the patient will continue his current treatment with Lopressor. I will see him back for follow-up visit in 2 weeks for evaluation and management of any adverse effect of his treatment. He was advised to call immediately if he has any concerning symptoms in the interval. The patient voices understanding of current disease status and treatment options and is in agreement with the current care plan.  All questions were answered. The patient knows to call the clinic with any problems, questions or concerns. We can certainly see the patient much sooner if necessary.  Thank you so much for allowing me to participate in the care of [facility]. I will continue to follow up the patient with you  and assist in his care.  I spent 55 minutes counseling the patient face to face. The total time spent in the appointment was 80 minutes.  Disclaimer: This note was dictated with voice recognition software. Similar sounding words can inadvertently be transcribed and may not be corrected upon review.   Laiken Nohr K. November 30, 2016, 3:02 PM

## 2016-11-30 NOTE — Telephone Encounter (Signed)
Chemo class scheduled for Wednesday, 12/02/17. Port placement scheduled at [facility] for Friday, 12/04/16, NPO after midnight on Thursday. Arrival time is 7:30 a.m. Driver must accompany patient. Patient stated that he is not on Blood thinners. Patient is aware of Port placement appointment and instructions. Labs, chemo and follow up with Dr Mohamed, scheduled per 11/30/16 los. Patient requested On pro kit for Neulasta, message was sent to Dr Mohamed. Patient was given a copy of the AVS report and appointment schedule, per 11/30/16 los.  °

## 2016-12-02 ENCOUNTER — Encounter: Payer: Self-pay | Admitting: *Deleted

## 2016-12-02 ENCOUNTER — Other Ambulatory Visit: Payer: Medicare Other

## 2016-12-03 ENCOUNTER — Other Ambulatory Visit: Payer: Self-pay | Admitting: Radiology

## 2016-12-03 ENCOUNTER — Ambulatory Visit (HOSPITAL_COMMUNITY): Payer: Medicare Other

## 2016-12-03 ENCOUNTER — Ambulatory Visit (HOSPITAL_COMMUNITY)
Admission: RE | Admit: 2016-12-03 | Discharge: 2016-12-03 | Disposition: A | Discharge status: Home / Self Care | Payer: Medicare Other | Source: Ambulatory Visit | Attending: Emergency Medicine | Admitting: Emergency Medicine

## 2016-12-03 DIAGNOSIS — R590 Localized enlarged lymph nodes: Secondary | ICD-10-CM | POA: Insufficient documentation

## 2016-12-03 DIAGNOSIS — C801 Malignant (primary) neoplasm, unspecified: Secondary | ICD-10-CM | POA: Diagnosis present

## 2016-12-03 DIAGNOSIS — R918 Other nonspecific abnormal finding of lung field: Secondary | ICD-10-CM | POA: Insufficient documentation

## 2016-12-03 LAB — GLUCOSE, CAPILLARY: Glucose-Capillary: 111 mg/dL — ABNORMAL HIGH (ref 65–99)

## 2016-12-03 MED ORDER — FLUDEOXYGLUCOSE F - 18 (FDG) INJECTION
7.9000 | Freq: Once | INTRAVENOUS | Status: AC | PRN
  Administered 2016-12-03: 7.9 via INTRAVENOUS

## 2016-12-04 ENCOUNTER — Other Ambulatory Visit: Payer: Self-pay | Admitting: Internal Medicine

## 2016-12-04 ENCOUNTER — Encounter (HOSPITAL_COMMUNITY): Payer: Self-pay

## 2016-12-04 ENCOUNTER — Ambulatory Visit (HOSPITAL_COMMUNITY)
Admission: RE | Admit: 2016-12-04 | Discharge: 2016-12-04 | Disposition: A | Discharge status: Home / Self Care | Payer: Medicare Other | Source: Ambulatory Visit | Attending: Internal Medicine | Admitting: Internal Medicine

## 2016-12-04 DIAGNOSIS — K219 Gastro-esophageal reflux disease without esophagitis: Secondary | ICD-10-CM | POA: Diagnosis not present

## 2016-12-04 DIAGNOSIS — I2510 Atherosclerotic heart disease of native coronary artery without angina pectoris: Secondary | ICD-10-CM | POA: Insufficient documentation

## 2016-12-04 DIAGNOSIS — M1990 Unspecified osteoarthritis, unspecified site: Secondary | ICD-10-CM | POA: Insufficient documentation

## 2016-12-04 DIAGNOSIS — G43909 Migraine, unspecified, not intractable, without status migrainosus: Secondary | ICD-10-CM | POA: Diagnosis not present

## 2016-12-04 DIAGNOSIS — Z7982 Long term (current) use of aspirin: Secondary | ICD-10-CM | POA: Diagnosis not present

## 2016-12-04 DIAGNOSIS — C3490 Malignant neoplasm of unspecified part of unspecified bronchus or lung: Secondary | ICD-10-CM | POA: Insufficient documentation

## 2016-12-04 DIAGNOSIS — F329 Major depressive disorder, single episode, unspecified: Secondary | ICD-10-CM | POA: Insufficient documentation

## 2016-12-04 DIAGNOSIS — E559 Vitamin D deficiency, unspecified: Secondary | ICD-10-CM | POA: Diagnosis not present

## 2016-12-04 DIAGNOSIS — C3492 Malignant neoplasm of unspecified part of left bronchus or lung: Secondary | ICD-10-CM

## 2016-12-04 DIAGNOSIS — Z8249 Family history of ischemic heart disease and other diseases of the circulatory system: Secondary | ICD-10-CM | POA: Diagnosis not present

## 2016-12-04 DIAGNOSIS — J301 Allergic rhinitis due to pollen: Secondary | ICD-10-CM | POA: Insufficient documentation

## 2016-12-04 DIAGNOSIS — G4730 Sleep apnea, unspecified: Secondary | ICD-10-CM | POA: Insufficient documentation

## 2016-12-04 DIAGNOSIS — Z87891 Personal history of nicotine dependence: Secondary | ICD-10-CM | POA: Insufficient documentation

## 2016-12-04 DIAGNOSIS — Z85828 Personal history of other malignant neoplasm of skin: Secondary | ICD-10-CM | POA: Diagnosis not present

## 2016-12-04 DIAGNOSIS — N200 Calculus of kidney: Secondary | ICD-10-CM | POA: Insufficient documentation

## 2016-12-04 DIAGNOSIS — Z9221 Personal history of antineoplastic chemotherapy: Secondary | ICD-10-CM | POA: Diagnosis not present

## 2016-12-04 DIAGNOSIS — E785 Hyperlipidemia, unspecified: Secondary | ICD-10-CM | POA: Diagnosis not present

## 2016-12-04 DIAGNOSIS — N400 Enlarged prostate without lower urinary tract symptoms: Secondary | ICD-10-CM | POA: Insufficient documentation

## 2016-12-04 HISTORY — PX: IR GENERIC HISTORICAL: IMG1180011

## 2016-12-04 LAB — APTT: APTT: 25 s (ref 24–36)

## 2016-12-04 LAB — BASIC METABOLIC PANEL
Anion gap: 8 (ref 5–15)
BUN: 16 mg/dL (ref 6–20)
CHLORIDE: 105 mmol/L (ref 101–111)
CO2: 28 mmol/L (ref 22–32)
CREATININE: 1.1 mg/dL (ref 0.61–1.24)
Calcium: 9.2 mg/dL (ref 8.9–10.3)
GFR calc Af Amer: >60 mL/min (ref >60)
GFR calc non Af Amer: >60 mL/min (ref >60)
GLUCOSE: 105 mg/dL — AB (ref 65–99)
POTASSIUM: 4.2 mmol/L (ref 3.5–5.1)
SODIUM: 141 mmol/L (ref 135–145)

## 2016-12-04 LAB — CBC
HEMATOCRIT: 39 % (ref 39.0–52.0)
Hemoglobin: 11.9 g/dL — ABNORMAL LOW (ref 13.0–17.0)
MCH: 26.5 pg (ref 26.0–34.0)
MCHC: 30.5 g/dL (ref 30.0–36.0)
MCV: 86.9 fL (ref 78.0–100.0)
PLATELETS: 232 10*3/uL (ref 150–400)
RBC: 4.49 MIL/uL (ref 4.22–5.81)
RDW: 15.6 % — AB (ref 11.5–15.5)
WBC: 10.9 10*3/uL — AB (ref 4.0–10.5)

## 2016-12-04 LAB — PROTIME-INR
INR: 1.07
Prothrombin Time: 13.9 seconds (ref 11.4–15.2)

## 2016-12-04 MED ORDER — FENTANYL CITRATE (PF) 100 MCG/2ML IJ SOLN
INTRAMUSCULAR | Status: AC
  Filled 2016-12-04: qty 2

## 2016-12-04 MED ORDER — LIDOCAINE-EPINEPHRINE (PF) 1 %-1:200000 IJ SOLN
INTRAMUSCULAR | Status: AC
  Filled 2016-12-04: qty 30

## 2016-12-04 MED ORDER — HEPARIN SOD (PORK) LOCK FLUSH 100 UNIT/ML IV SOLN
INTRAVENOUS | Status: AC
  Filled 2016-12-04: qty 5

## 2016-12-04 MED ORDER — MIDAZOLAM HCL 2 MG/2ML IJ SOLN
INTRAMUSCULAR | Status: AC | PRN
  Administered 2016-12-04: 1 mg via INTRAVENOUS

## 2016-12-04 MED ORDER — SODIUM CHLORIDE 0.9 % IV SOLN
INTRAVENOUS | Status: DC
  Administered 2016-12-04: 10 mL/h via INTRAVENOUS

## 2016-12-04 MED ORDER — LIDOCAINE-EPINEPHRINE 2 %-1:100000 IJ SOLN
INTRAMUSCULAR | Status: AC | PRN
  Administered 2016-12-04: 20 mL

## 2016-12-04 MED ORDER — CEFAZOLIN SODIUM-DEXTROSE 2-4 GM/100ML-% IV SOLN
2.0000 g | INTRAVENOUS | Status: AC
  Administered 2016-12-04: 2 g via INTRAVENOUS

## 2016-12-04 MED ORDER — CEFAZOLIN SODIUM-DEXTROSE 2-4 GM/100ML-% IV SOLN
INTRAVENOUS | Status: AC
  Administered 2016-12-04: 2 g via INTRAVENOUS
  Filled 2016-12-04: qty 100

## 2016-12-04 MED ORDER — FENTANYL CITRATE (PF) 100 MCG/2ML IJ SOLN
INTRAMUSCULAR | Status: AC | PRN
  Administered 2016-12-04: 50 ug via INTRAVENOUS
  Administered 2016-12-04: 25 ug via INTRAVENOUS

## 2016-12-04 MED ORDER — MIDAZOLAM HCL 2 MG/2ML IJ SOLN
INTRAMUSCULAR | Status: AC
  Filled 2016-12-04: qty 2

## 2016-12-04 NOTE — Sedation Documentation (Signed)
Patient is resting comfortably. 

## 2016-12-04 NOTE — Discharge Instructions (Signed)
Implanted Port Insertion, Care After Refer to this sheet in the next few weeks. These instructions provide you with information on caring for yourself after your procedure. Your health care provider may also give you more specific instructions. Your treatment has been planned according to current medical practices, but problems sometimes occur. Call your health care provider if you have any problems or questions after your procedure. WHAT TO EXPECT AFTER THE PROCEDURE After your procedure, it is typical to have the following:   Discomfort at the port insertion site. Ice packs to the area will help.  Bruising on the skin over the port. This will subside in 3-4 days. HOME CARE INSTRUCTIONS  After your port is placed, you will get a manufacturer's information card. The card has information about your port. Keep this card with you at all times.   Know what kind of port you have. There are many types of ports available.   Wear a medical alert bracelet in case of an emergency. This can help alert health care workers that you have a port.   The port can stay in for as long as your health care provider believes it is necessary.   A home health care nurse may give medicines and take care of the port.   You or a family member can get special training and directions for giving medicine and taking care of the port at home.  SEEK MEDICAL CARE IF:   Your port does not flush or you are unable to get a blood return.   You have a fever or chills. SEEK IMMEDIATE MEDICAL CARE IF:  You have new fluid or pus coming from your incision.   You notice a bad smell coming from your incision site.   You have swelling, pain, or more redness at the incision or port site.   You have chest pain or shortness of breath. This information is not intended to replace advice given to you by your health care provider. Make sure you discuss any questions you have with your health care provider. Document  Released: 07/19/2013 Document Revised: 10/03/2013 Document Reviewed: 07/19/2013 Elsevier Interactive Patient Education  2017 [employer].

## 2016-12-04 NOTE — Procedures (Signed)
Lung ca  S/p RT IJ POWERPORT  TIP SVCRA NO COMP STABLE READY FOR USE FULL REPORT IN PACS  

## 2016-12-04 NOTE — H&P (Signed)
Chief Complaint: Patient was seen in consultation today for port a cath placement at the request of [facility]  Referring Physician(s): Christian Munoz,Christian Munoz  Supervising Physician: Christian Munoz  Patient Status: [facility] - Out-pt  History of Present Illness: Christian Munoz is a 82 y.o. male   Newly diagnosed small cell lung cancer To start chemotherapy Christian Munoz Christian Munoz request [facility] placement   Past Medical History:  Diagnosis Date  . Allergic rhinitis due to pollen   . Anginal pain ([place])    Past medical history " does not seem to be a problem at all now."  . Coronary atherosclerosis of native coronary artery   . Coronary atherosclerosis of native coronary artery   . Depressive disorder, not elsewhere classified   . Encounter for antineoplastic chemotherapy 11/30/2016  . GERD (gastroesophageal reflux disease)   . Goals of care, counseling/discussion 11/30/2016  . Headache    PMH: Migraines  . Herpes genitalia   . Hypertrophy of prostate without urinary obstruction and other lower urinary tract symptoms (LUTS)   . Hypertrophy of prostate without urinary obstruction and other lower urinary tract symptoms (LUTS)   . Lack of coordination   . Lack of coordination   . Lumbago   . Nausea and vomiting 11/30/2016  . Osteoarthrosis, unspecified whether generalized or localized, unspecified site   . Osteoarthrosis, unspecified whether generalized or localized, unspecified site   . Other and unspecified hyperlipidemia   . Other malaise and fatigue   . Pneumonia   . Reflux esophagitis   . Skin cancer   . Sleep apnea    does not wear CPAP  . Syncope and collapse   . Urinary frequency   . Vitamin D deficiency     Past Surgical History:  Procedure Laterality Date  . CARDIAC CATHETERIZATION    . COLONOSCOPY    . EYE SURGERY    . poly vocal cords  1985  . SKIN CANCER EXCISION  2011  . TONSILLECTOMY    . VASECTOMY    . VIDEO BRONCHOSCOPY WITH ENDOBRONCHIAL ULTRASOUND N/A  11/20/2016   Procedure: VIDEO BRONCHOSCOPY WITH ENDOBRONCHIAL ULTRASOUND;  Surgeon: Collene Gobble, MD;  Location: [facility] OR;  Service: Thoracic;  Laterality: N/A;    Allergies: Chantix [varenicline] and Zoloft [sertraline hcl]  Medications: Prior to Admission medications   Medication Sig Start Date End Date Taking? Authorizing Provider  aspirin 81 MG tablet Take 81 mg by mouth daily. Take one tablet once a day   Yes Historical Provider, MD  escitalopram (LEXAPRO) 5 MG tablet Take 1 tablet (5 mg total) by mouth every morning. 11/13/16  Yes Christian Chandler, NP  fish oil-omega-3 fatty acids 1000 MG capsule Take 2 g by mouth daily. Take one tablet twice a day   Yes Historical Provider, MD  Glucosamine-Chondroit-Vit C-Mn (GLUCOSAMINE 1500 COMPLEX) CAPS Take 2 capsules by mouth daily.   Yes Historical Provider, MD  lidocaine-prilocaine (EMLA) cream Apply 1 application topically as needed. 11/30/16  Yes Christian Bears, MD  metoprolol tartrate (LOPRESSOR) 25 MG tablet TAKE 1 TABLET BY MOUTH TWICE DAILY 07/08/16  Yes Christian Chandler, NP  Multiple Vitamins-Minerals (MULTIVITAMIN WITH MINERALS) tablet Take 1 tablet by mouth daily.   Yes Historical Provider, MD  Multiple Vitamins-Minerals (PRESERVISION AREDS 2) CAPS Take 2 capsules by mouth daily.   Yes Historical Provider, MD  pantoprazole (PROTONIX) 20 MG tablet TAKE 1 TABLET(20 MG) BY MOUTH DAILY 07/23/16  Yes Christian Chandler, NP  prochlorperazine (COMPAZINE) 10 MG tablet Take 1  tablet (10 mg total) by mouth every 6 (six) hours as needed for nausea or vomiting. 11/30/16  Yes Christian Bears, MD  QUEtiapine (SEROQUEL) 25 MG tablet Take 1 tablet (25 mg total) by mouth at bedtime. 1/2- 1 pill po qhs 08/21/16  Yes Christian Pines, MD  simvastatin (ZOCOR) 20 MG tablet TAKE 1 TABLET(20 MG) BY MOUTH DAILY 06/12/16  Yes Christian Chandler, NP  tamsulosin (FLOMAX) 0.4 MG CAPS capsule TAKE 1 CAPSULE(0.4 MG) BY MOUTH DAILY 06/12/16  Yes Christian Chandler, NP     Family  History  Problem Relation Age of Onset  . Heart disease Father   . Cancer Father   . Cancer Brother   . Cancer Brother     Social History   Social History  . Marital status: Divorced    Spouse name: N/A  . Number of children: N/A  . Years of education: N/A   Social History Main Topics  . Smoking status: Former Smoker    Types: Cigarettes    Quit date: 08/12/2016  . Smokeless tobacco: Never Used     Comment: 3 per day  . Alcohol use No  . Drug use: No  . Sexual activity: Not Currently   Other Topics Concern  . None   Social History Narrative  . None    Review of Systems: A 12 point ROS discussed and pertinent positives are indicated in the HPI above.  All other systems are negative.  Review of Systems  Constitutional: Negative for activity change, appetite change and fatigue.  Eyes: Positive for visual disturbance.       Mostly blind  Gastrointestinal: Negative for abdominal pain.  Neurological: Negative for weakness.  Psychiatric/Behavioral: Negative for behavioral problems and confusion.    Vital Signs: BP 126/76   Pulse 70   Temp 98 F (36.7 C) (Oral)   Resp 20   Ht '5\' 10"'$  (1.778 m)   Wt 159 lb (72.1 kg)   SpO2 98%   BMI 22.81 kg/m   Physical Exam  Constitutional: He is oriented to person, place, and time.  Cardiovascular: Normal rate, regular rhythm and normal heart sounds.   Pulmonary/Chest: Effort normal and breath sounds normal.  Abdominal: Soft. Bowel sounds are normal.  Musculoskeletal: Normal range of motion.  Neurological: He is alert and oriented to person, place, and time.  Skin: Skin is warm and dry.  Psychiatric: He has a normal mood and affect. His behavior is normal. Judgment and thought content normal.  Nursing note and vitals reviewed.   Mallampati Score:  MD Evaluation Airway: WNL Heart: WNL Abdomen: WNL Chest/ Lungs: WNL ASA  Classification: 3 Mallampati/Airway Score: One  Imaging: Nm Pet Image Initial (pi) Skull Base  To Thigh  Result Date: 12/03/2016 CLINICAL DATA:  Initial treatment strategy for small-cell lung cancer. EXAM: NUCLEAR MEDICINE PET SKULL BASE TO THIGH TECHNIQUE: 7.9 mCi F-18 FDG was injected intravenously. Full-ring PET imaging was performed from the skull base to thigh after the radiotracer. CT data was obtained and used for attenuation correction and anatomic localization. FASTING BLOOD GLUCOSE:  Value: 111 mg/dl COMPARISON:  Chest CT 10/23/2016. FINDINGS: NECK No hypermetabolic lymph nodes in the neck. CHEST Left infrahilar lesion is markedly hypermetabolic with SUV max = 22. Low level FDG uptake is identified in subcarinal lymph nodes that are not enlarged by CT criteria. Left lower lobe peripheral airspace opacity and effusion show low level FDG uptake. ABDOMEN/PELVIS No abnormal hypermetabolic activity within the liver, pancreas, adrenal glands, or spleen.  No hypermetabolic lymph nodes in the abdomen or pelvis. Multiple cysts are seen in the liver. Numerous bilateral nonobstructing renal calculi are evident. There is abdominal aortic atherosclerosis without aneurysm. Diverticular changes noted diffusely in the colon. The prostate gland is enlarged. Scattered areas of FDG uptake are identified in the terminal ileum and segments of the colon, likely physiologic. SKELETON Small foci of uptake are identified in the T12 and L1 vertebral bodies without underlying cortical lesion on CT imaging. There is also increased activity in the L3 and L4 spinous processes that show areas of heterogeneous mineralization and expansion on CT images. IMPRESSION: 1. Markedly hypermetabolic left infrahilar mass, consistent with known neoplasm. Adjacent airspace disease shows FDG uptake in may be postobstructive inflammatory change but tumor spread is not entirely excluded. 2. Low level uptake identified and nonenlarged subcarinal lymph nodes. Metastatic disease is a possibility. 3. Small foci of uptake in the T12 and L1 vertebral  bodies without associated bony abnormality on CT imaging. There is also uptake in the L3 and L4 spinous processes did show evidence of heterogeneous mineralization and expansion. Given the consecutive involvement of the spinous processes, this is probably degenerative. Bone scan or MRI of the lumbar spine without with contrast could be used to further evaluate, as clinically warranted. Electronically Signed   By: Christian Munoz M.D.   On: 12/03/2016 18:12    Labs:  CBC:  Recent Labs  10/23/16 1523 11/20/16 0800 11/30/16 1341 12/04/16 0814  WBC 13.1* 10.2 10.2 10.9*  HGB 11.0* 10.6* 11.9* 11.9*  HCT 34.8* 35.4* 38.3* 39.0  PLT 291 198 229 232    COAGS:  Recent Labs  11/20/16 0800  INR 1.23  APTT 31    BMP:  Recent Labs  12/10/15 1017 07/13/16 1407 10/23/16 1523 11/20/16 0800 11/30/16 1341  NA 140 139 134* 140 140  K 4.8 4.3 3.7 3.9 4.4  CL 101 103 98* 105  --   CO2 '21 26 24 27 27  '$ GLUCOSE 102* 97 168* 104* 109  BUN '23 21 11 14 '$ 15.3  CALCIUM 9.1 9.5 8.7* 8.7* 9.5  CREATININE 1.23 1.10 1.13 1.25* 1.1  GFRNONAA 56* 64 60* 53*  --   GFRAA 65 74 >60 >60  --     LIVER FUNCTION TESTS:  Recent Labs  12/10/15 1017 07/13/16 1407 11/20/16 0800 11/30/16 1341  BILITOT 0.3 0.4 0.7 0.35  AST '18 12 20 18  '$ ALT 14 5* 11* 11  ALKPHOS 77 70 59 82  PROT 6.3 6.5 6.0* 6.9  ALBUMIN 4.1 4.0 3.0* 3.2*    TUMOR MARKERS: No results for input(s): AFPTM, CEA, CA199, CHROMGRNA in the last 8760 hours.  Assessment and Plan:  Small cell lung Ca Scheduled for PAC placement for chemotherapy Risks and Benefits discussed with the patient including, but not limited to bleeding, infection, pneumothorax, or fibrin sheath development and need for additional procedures. All of the patient's questions were answered, patient is agreeable to proceed. Consent signed and in chart.   Thank you for this interesting consult.  I greatly enjoyed meeting Omeed Osuna and look forward to  participating in their care.  A copy of this report was sent to the requesting provider on this date.  Electronically Signed: Monia Sabal A 12/04/2016, 8:46 AM   I spent a total of  30 Minutes   in face to face in clinical consultation, greater than 50% of which was counseling/coordinating care for [facility] placement

## 2016-12-05 ENCOUNTER — Ambulatory Visit (HOSPITAL_COMMUNITY)
Admission: RE | Admit: 2016-12-05 | Discharge: 2016-12-05 | Disposition: A | Discharge status: Home / Self Care | Payer: Medicare Other | Source: Ambulatory Visit | Attending: Internal Medicine | Admitting: Internal Medicine

## 2016-12-05 DIAGNOSIS — C3490 Malignant neoplasm of unspecified part of unspecified bronchus or lung: Secondary | ICD-10-CM | POA: Diagnosis not present

## 2016-12-05 DIAGNOSIS — C3492 Malignant neoplasm of unspecified part of left bronchus or lung: Secondary | ICD-10-CM

## 2016-12-05 MED ORDER — GADOBENATE DIMEGLUMINE 529 MG/ML IV SOLN
14.0000 mL | Freq: Once | INTRAVENOUS | Status: AC | PRN
  Administered 2016-12-05: 14 mL via INTRAVENOUS

## 2016-12-07 ENCOUNTER — Ambulatory Visit (HOSPITAL_BASED_OUTPATIENT_CLINIC_OR_DEPARTMENT_OTHER): Payer: Medicare Other

## 2016-12-07 ENCOUNTER — Other Ambulatory Visit (HOSPITAL_BASED_OUTPATIENT_CLINIC_OR_DEPARTMENT_OTHER): Payer: Medicare Other

## 2016-12-07 VITALS — BP 131/74 | HR 69 | Temp 98.1°F

## 2016-12-07 DIAGNOSIS — C3492 Malignant neoplasm of unspecified part of left bronchus or lung: Secondary | ICD-10-CM

## 2016-12-07 DIAGNOSIS — C3432 Malignant neoplasm of lower lobe, left bronchus or lung: Secondary | ICD-10-CM

## 2016-12-07 DIAGNOSIS — Z5111 Encounter for antineoplastic chemotherapy: Secondary | ICD-10-CM | POA: Diagnosis present

## 2016-12-07 LAB — COMPREHENSIVE METABOLIC PANEL
ALT: 10 U/L (ref 0–55)
ANION GAP: 8 meq/L (ref 3–11)
AST: 19 U/L (ref 5–34)
Albumin: 3.2 g/dL — ABNORMAL LOW (ref 3.5–5.0)
Alkaline Phosphatase: 77 U/L (ref 40–150)
BILIRUBIN TOTAL: 0.39 mg/dL (ref 0.20–1.20)
BUN: 13.6 mg/dL (ref 7.0–26.0)
CO2: 26 meq/L (ref 22–29)
CREATININE: 1 mg/dL (ref 0.7–1.3)
Calcium: 9.5 mg/dL (ref 8.4–10.4)
Chloride: 106 mEq/L (ref 98–109)
EGFR: 74 mL/min/{1.73_m2} — ABNORMAL LOW (ref >90)
GLUCOSE: 98 mg/dL (ref 70–140)
Potassium: 4.3 mEq/L (ref 3.5–5.1)
Sodium: 140 mEq/L (ref 136–145)
TOTAL PROTEIN: 7.1 g/dL (ref 6.4–8.3)

## 2016-12-07 LAB — CBC WITH DIFFERENTIAL/PLATELET
BASO%: 0.4 % (ref 0.0–2.0)
Basophils Absolute: 0 10*3/uL (ref 0.0–0.1)
EOS ABS: 0.5 10*3/uL (ref 0.0–0.5)
EOS%: 4.9 % (ref 0.0–7.0)
HCT: 35.8 % — ABNORMAL LOW (ref 38.4–49.9)
HEMOGLOBIN: 11.8 g/dL — AB (ref 13.0–17.1)
LYMPH#: 3 10*3/uL (ref 0.9–3.3)
LYMPH%: 30.9 % (ref 14.0–49.0)
MCH: 27.3 pg (ref 27.2–33.4)
MCHC: 32.8 g/dL (ref 32.0–36.0)
MCV: 83.2 fL (ref 79.3–98.0)
MONO#: 0.6 10*3/uL (ref 0.1–0.9)
MONO%: 5.7 % (ref 0.0–14.0)
NEUT%: 58.1 % (ref 39.0–75.0)
NEUTROS ABS: 5.6 10*3/uL (ref 1.5–6.5)
PLATELETS: 236 10*3/uL (ref 140–400)
RBC: 4.31 10*6/uL (ref 4.20–5.82)
RDW: 16.6 % — AB (ref 11.0–14.6)
WBC: 9.7 10*3/uL (ref 4.0–10.3)

## 2016-12-07 MED ORDER — PALONOSETRON HCL INJECTION 0.25 MG/5ML
INTRAVENOUS | Status: AC
  Filled 2016-12-07: qty 5

## 2016-12-07 MED ORDER — HEPARIN SOD (PORK) LOCK FLUSH 100 UNIT/ML IV SOLN
500.0000 [IU] | Freq: Once | INTRAVENOUS | Status: AC | PRN
  Administered 2016-12-07: 500 [IU]
  Filled 2016-12-07: qty 5

## 2016-12-07 MED ORDER — DEXAMETHASONE SODIUM PHOSPHATE 10 MG/ML IJ SOLN
INTRAMUSCULAR | Status: AC
  Filled 2016-12-07: qty 1

## 2016-12-07 MED ORDER — SODIUM CHLORIDE 0.9 % IV SOLN
402.5000 mg | Freq: Once | INTRAVENOUS | Status: AC
  Administered 2016-12-07: 400 mg via INTRAVENOUS
  Filled 2016-12-07: qty 40

## 2016-12-07 MED ORDER — SODIUM CHLORIDE 0.9 % IV SOLN: 10.0000 mg | Freq: Once | INTRAVENOUS | Status: DC

## 2016-12-07 MED ORDER — SODIUM CHLORIDE 0.9% FLUSH
10.0000 mL | INTRAVENOUS | Status: DC | PRN
  Administered 2016-12-07: 10 mL
  Filled 2016-12-07: qty 10

## 2016-12-07 MED ORDER — PALONOSETRON HCL INJECTION 0.25 MG/5ML
0.2500 mg | Freq: Once | INTRAVENOUS | Status: AC
  Administered 2016-12-07: 0.25 mg via INTRAVENOUS

## 2016-12-07 MED ORDER — SODIUM CHLORIDE 0.9 % IV SOLN
100.0000 mg/m2 | Freq: Once | INTRAVENOUS | Status: AC
  Administered 2016-12-07: 190 mg via INTRAVENOUS
  Filled 2016-12-07: qty 9.5

## 2016-12-07 MED ORDER — DEXAMETHASONE SODIUM PHOSPHATE 10 MG/ML IJ SOLN
10.0000 mg | Freq: Once | INTRAMUSCULAR | Status: AC
  Administered 2016-12-07: 10 mg via INTRAVENOUS

## 2016-12-07 MED ORDER — SODIUM CHLORIDE 0.9 % IV SOLN
Freq: Once | INTRAVENOUS | Status: AC
  Administered 2016-12-07: 13:00:00 via INTRAVENOUS

## 2016-12-07 NOTE — Patient Instructions (Signed)
[facility] Discharge Instructions for Patients Receiving Chemotherapy  Today you received the following chemotherapy agents carboplatin (Paraplatin) and etoposide (Vepesid)  To help prevent nausea and vomiting after your treatment, we encourage you to take your nausea medication as directed by your MD.   If you develop nausea and vomiting that is not controlled by your nausea medication, call the clinic.   BELOW ARE SYMPTOMS THAT SHOULD BE REPORTED IMMEDIATELY:  *FEVER GREATER THAN 100.5 F  *CHILLS WITH OR WITHOUT FEVER  NAUSEA AND VOMITING THAT IS NOT CONTROLLED WITH YOUR NAUSEA MEDICATION  *UNUSUAL SHORTNESS OF BREATH  *UNUSUAL BRUISING OR BLEEDING  TENDERNESS IN MOUTH AND THROAT WITH OR WITHOUT PRESENCE OF ULCERS  *URINARY PROBLEMS  *BOWEL PROBLEMS  UNUSUAL RASH Items with * indicate a potential emergency and should be followed up as soon as possible.  Feel free to call the clinic you have any questions or concerns. The clinic phone number is [phone].  Please show the [facility] at check-in to the Emergency Department and triage nurse.  Palonosetron Injection What is this medicine? PALONOSETRON (pal oh NOE se tron) is used to prevent nausea and vomiting caused by chemotherapy. It also helps prevent delayed nausea and vomiting that may occur a few days after your treatment. This medicine may be used for other purposes; ask your health care provider or pharmacist if you have questions. COMMON BRAND NAME(S): Aloxi What should I tell my health care provider before I take this medicine? They need to know if you have any of these conditions: -an unusual or allergic reaction to palonosetron, dolasetron, granisetron, ondansetron, other medicines, foods, dyes, or preservatives -pregnant or trying to get pregnant -breast-feeding How should I use this medicine? This medicine is for infusion into a vein. It is given by a health care professional in a  hospital or clinic setting. Talk to your pediatrician regarding the use of this medicine in children. While this drug may be prescribed for children as young as 1 month for selected conditions, precautions do apply. Overdosage: If you think you have taken too much of this medicine contact a poison control center or emergency room at once. NOTE: This medicine is only for you. Do not share this medicine with others. What if I miss a dose? This does not apply. What may interact with this medicine? -certain medicines for depression, anxiety, or psychotic disturbances -fentanyl -linezolid -MAOIs like Carbex, Eldepryl, Marplan, Nardil, and Parnate -methylene blue (injected into a vein) -tramadol This list may not describe all possible interactions. Give your health care provider a list of all the medicines, herbs, non-prescription drugs, or dietary supplements you use. Also tell them if you smoke, drink alcohol, or use illegal drugs. Some items may interact with your medicine. What should I watch for while using this medicine? Your condition will be monitored carefully while you are receiving this medicine. What side effects may I notice from receiving this medicine? Side effects that you should report to your doctor or health care professional as soon as possible: -allergic reactions like skin rash, itching or hives, swelling of the face, lips, or tongue -breathing problems -confusion -dizziness -fast, irregular heartbeat -fever and chills -loss of balance or coordination -seizures -sweating -swelling of the hands and feet -tremors -unusually weak or tired Side effects that usually do not require medical attention (report to your doctor or health care professional if they continue or are bothersome): -constipation or diarrhea -headache This list may not describe all possible  side effects. Call your doctor for medical advice about side effects. You may report side effects to FDA at  1-800-FDA-1088. Where should I keep my medicine? This drug is given in a hospital or clinic and will not be stored at home. NOTE: This sheet is a summary. It may not cover all possible information. If you have questions about this medicine, talk to your doctor, pharmacist, or health care provider.  2017 Elsevier/Gold Standard (2013-08-04 10:38:36)   Carboplatin injection What is this medicine? CARBOPLATIN (KAR boe pla tin) is a chemotherapy drug. It targets fast dividing cells, like cancer cells, and causes these cells to die. This medicine is used to treat ovarian cancer and many other cancers. This medicine may be used for other purposes; ask your health care provider or pharmacist if you have questions. COMMON BRAND NAME(S): Paraplatin What should I tell my health care provider before I take this medicine? They need to know if you have any of these conditions: -blood disorders -hearing problems -kidney disease -recent or ongoing radiation therapy -an unusual or allergic reaction to carboplatin, cisplatin, other chemotherapy, other medicines, foods, dyes, or preservatives -pregnant or trying to get pregnant -breast-feeding How should I use this medicine? This drug is usually given as an infusion into a vein. It is administered in a hospital or clinic by a specially trained health care professional. Talk to your pediatrician regarding the use of this medicine in children. Special care may be needed. Overdosage: If you think you have taken too much of this medicine contact a poison control center or emergency room at once. NOTE: This medicine is only for you. Do not share this medicine with others. What if I miss a dose? It is important not to miss a dose. Call your doctor or health care professional if you are unable to keep an appointment. What may interact with this medicine? -medicines for seizures -medicines to increase blood counts like filgrastim, pegfilgrastim,  sargramostim -some antibiotics like amikacin, gentamicin, neomycin, streptomycin, tobramycin -vaccines Talk to your doctor or health care professional before taking any of these medicines: -acetaminophen -aspirin -ibuprofen -ketoprofen -naproxen This list may not describe all possible interactions. Give your health care provider a list of all the medicines, herbs, non-prescription drugs, or dietary supplements you use. Also tell them if you smoke, drink alcohol, or use illegal drugs. Some items may interact with your medicine. What should I watch for while using this medicine? Your condition will be monitored carefully while you are receiving this medicine. You will need important blood work done while you are taking this medicine. This drug may make you feel generally unwell. This is not uncommon, as chemotherapy can affect healthy cells as well as cancer cells. Report any side effects. Continue your course of treatment even though you feel ill unless your doctor tells you to stop. In some cases, you may be given additional medicines to help with side effects. Follow all directions for their use. Call your doctor or health care professional for advice if you get a fever, chills or sore throat, or other symptoms of a cold or flu. Do not treat yourself. This drug decreases your body's ability to fight infections. Try to avoid being around people who are sick. This medicine may increase your risk to bruise or bleed. Call your doctor or health care professional if you notice any unusual bleeding. Be careful brushing and flossing your teeth or using a toothpick because you may get an infection or bleed more easily. If  you have any dental work done, tell your dentist you are receiving this medicine. Avoid taking products that contain aspirin, acetaminophen, ibuprofen, naproxen, or ketoprofen unless instructed by your doctor. These medicines may hide a fever. Do not become pregnant while taking this  medicine. Women should inform their doctor if they wish to become pregnant or think they might be pregnant. There is a potential for serious side effects to an unborn child. Talk to your health care professional or pharmacist for more information. Do not breast-feed an infant while taking this medicine. What side effects may I notice from receiving this medicine? Side effects that you should report to your doctor or health care professional as soon as possible: -allergic reactions like skin rash, itching or hives, swelling of the face, lips, or tongue -signs of infection - fever or chills, cough, sore throat, pain or difficulty passing urine -signs of decreased platelets or bleeding - bruising, pinpoint red spots on the skin, black, tarry stools, nosebleeds -signs of decreased red blood cells - unusually weak or tired, fainting spells, lightheadedness -breathing problems -changes in hearing -changes in vision -chest pain -high blood pressure -low blood counts - This drug may decrease the number of white blood cells, red blood cells and platelets. You may be at increased risk for infections and bleeding. -nausea and vomiting -pain, swelling, redness or irritation at the injection site -pain, tingling, numbness in the hands or feet -problems with balance, talking, walking -trouble passing urine or change in the amount of urine Side effects that usually do not require medical attention (report to your doctor or health care professional if they continue or are bothersome): -hair loss -loss of appetite -metallic taste in the mouth or changes in taste This list may not describe all possible side effects. Call your doctor for medical advice about side effects. You may report side effects to FDA at 1-800-FDA-1088. Where should I keep my medicine? This drug is given in a hospital or clinic and will not be stored at home. NOTE: This sheet is a summary. It may not cover all possible information. If you  have questions about this medicine, talk to your doctor, pharmacist, or health care provider.  2017 Elsevier/Gold Standard (2008-01-03 14:38:05)  Dexamethasone injection What is this medicine? DEXAMETHASONE (dex a METH a sone) is a corticosteroid. It is used to treat inflammation of the skin, joints, lungs, and other organs. Common conditions treated include asthma, allergies, and arthritis. It is also used for other conditions, like blood disorders and diseases of the adrenal glands. This medicine may be used for other purposes; ask your health care provider or pharmacist if you have questions. COMMON BRAND NAME(S): Decadron, DoubleDex, Simplist Dexamethasone, Solurex What should I tell my health care provider before I take this medicine? They need to know if you have any of these conditions: -blood clotting problems -Cushing's syndrome -diabetes -glaucoma -heart problems or disease -high blood pressure -infection like herpes, measles, tuberculosis, or chickenpox -kidney disease -liver disease -mental problems -myasthenia gravis -osteoporosis -previous heart attack -seizures -stomach, ulcer or intestine disease including colitis and diverticulitis -thyroid problem -an unusual or allergic reaction to dexamethasone, corticosteroids, other medicines, lactose, foods, dyes, or preservatives -pregnant or trying to get pregnant -breast-feeding How should I use this medicine? This medicine is for injection into a muscle, joint, lesion, soft tissue, or vein. It is given by a health care professional in a hospital or clinic setting. Talk to your pediatrician regarding the use of this medicine in children.  Special care may be needed. Overdosage: If you think you have taken too much of this medicine contact a poison control center or emergency room at once. NOTE: This medicine is only for you. Do not share this medicine with others. What if I miss a dose? This may not apply. If you are  having a series of injections over a prolonged period, try not to miss an appointment. Call your doctor or health care professional to reschedule if you are unable to keep an appointment. What may interact with this medicine? Do not take this medicine with any of the following medications: -mifepristone, RU-486 -vaccines This medicine may also interact with the following medications: -amphotericin B -antibiotics like clarithromycin, erythromycin, and troleandomycin -aspirin and aspirin-like drugs -barbiturates like phenobarbital -carbamazepine -cholestyramine -cholinesterase inhibitors like donepezil, galantamine, rivastigmine, and tacrine -cyclosporine -digoxin -diuretics -ephedrine -male hormones, like estrogens or progestins and birth control pills -indinavir -isoniazid -ketoconazole -medicines for diabetes -medicines that improve muscle tone or strength for conditions like myasthenia gravis -NSAIDs, medicines for pain and inflammation, like ibuprofen or naproxen -phenytoin -rifampin -thalidomide -warfarin This list may not describe all possible interactions. Give your health care provider a list of all the medicines, herbs, non-prescription drugs, or dietary supplements you use. Also tell them if you smoke, drink alcohol, or use illegal drugs. Some items may interact with your medicine. What should I watch for while using this medicine? Your condition will be monitored carefully while you are receiving this medicine. If you are taking this medicine for a long time, carry an identification card with your name and address, the type and dose of your medicine, and your doctor's name and address. This medicine may increase your risk of getting an infection. Stay away from people who are sick. Tell your doctor or health care professional if you are around anyone with measles or chickenpox. Talk to your health care provider before you get any vaccines that you take this medicine. If  you are going to have surgery, tell your doctor or health care professional that you have taken this medicine within the last twelve months. Ask your doctor or health care professional about your diet. You may need to lower the amount of salt you eat. The medicine can increase your blood sugar. If you are a diabetic check with your doctor if you need help adjusting the dose of your diabetic medicine. What side effects may I notice from receiving this medicine? Side effects that you should report to your doctor or health care professional as soon as possible: -allergic reactions like skin rash, itching or hives, swelling of the face, lips, or tongue -black or tarry stools -change in the amount of urine -changes in vision -confusion, excitement, restlessness, a false sense of well-being -fever, sore throat, sneezing, cough, or other signs of infection, wounds that will not heal -hallucinations -increased thirst -mental depression, mood swings, mistaken feelings of self importance or of being mistreated -pain in hips, back, ribs, arms, shoulders, or legs -pain, redness, or irritation at the injection site -redness, blistering, peeling or loosening of the skin, including inside the mouth -rounding out of face -swelling of feet or lower legs -unusual bleeding or bruising -unusual tired or weak -wounds that do not heal Side effects that usually do not require medical attention (report to your doctor or health care professional if they continue or are bothersome): -diarrhea or constipation -change in taste -headache -nausea, vomiting -skin problems, acne, thin and shiny skin -touble sleeping -unusual growth of hair  on the face or body -weight gain This list may not describe all possible side effects. Call your doctor for medical advice about side effects. You may report side effects to FDA at 1-800-FDA-1088. Where should I keep my medicine? This drug is given in a hospital or clinic and  will not be stored at home. NOTE: This sheet is a summary. It may not cover all possible information. If you have questions about this medicine, talk to your doctor, pharmacist, or health care provider.  2017 Elsevier/Gold Standard (2008-01-19 14:04:12)   Etoposide, VP-16 injection What is this medicine? ETOPOSIDE, VP-16 (e toe POE side) is a chemotherapy drug. It is used to treat testicular cancer, lung cancer, and other cancers. This medicine may be used for other purposes; ask your health care provider or pharmacist if you have questions. COMMON BRAND NAME(S): Etopophos, Toposar, VePesid What should I tell my health care provider before I take this medicine? They need to know if you have any of these conditions: -infection -kidney disease -liver disease -low blood counts, like low white cell, platelet, or red cell counts -an unusual or allergic reaction to etoposide, other medicines, foods, dyes, or preservatives -pregnant or trying to get pregnant -breast-feeding How should I use this medicine? This medicine is for infusion into a vein. It is administered in a hospital or clinic by a specially trained health care professional. Talk to your pediatrician regarding the use of this medicine in children. Special care may be needed. Overdosage: If you think you have taken too much of this medicine contact a poison control center or emergency room at once. NOTE: This medicine is only for you. Do not share this medicine with others. What if I miss a dose? It is important not to miss your dose. Call your doctor or health care professional if you are unable to keep an appointment. What may interact with this medicine? -aspirin -certain medications for seizures like carbamazepine, phenobarbital, phenytoin, valproic acid -cyclosporine -levamisole -warfarin This list may not describe all possible interactions. Give your health care provider a list of all the medicines, herbs, non-prescription  drugs, or dietary supplements you use. Also tell them if you smoke, drink alcohol, or use illegal drugs. Some items may interact with your medicine. What should I watch for while using this medicine? Visit your doctor for checks on your progress. This drug may make you feel generally unwell. This is not uncommon, as chemotherapy can affect healthy cells as well as cancer cells. Report any side effects. Continue your course of treatment even though you feel ill unless your doctor tells you to stop. In some cases, you may be given additional medicines to help with side effects. Follow all directions for their use. Call your doctor or health care professional for advice if you get a fever, chills or sore throat, or other symptoms of a cold or flu. Do not treat yourself. This drug decreases your body's ability to fight infections. Try to avoid being around people who are sick. This medicine may increase your risk to bruise or bleed. Call your doctor or health care professional if you notice any unusual bleeding. Talk to your doctor about your risk of cancer. You may be more at risk for certain types of cancers if you take this medicine. Do not become pregnant while taking this medicine or for at least 6 months after stopping it. Women should inform their doctor if they wish to become pregnant or think they might be pregnant. Women of  child-bearing potential will need to have a negative pregnancy test before starting this medicine. There is a potential for serious side effects to an unborn child. Talk to your health care professional or pharmacist for more information. Do not breast-feed an infant while taking this medicine. Men must use a latex condom during sexual contact with a woman while taking this medicine and for at least 4 months after stopping it. A latex condom is needed even if you have had a vasectomy. Contact your doctor right away if your partner becomes pregnant. Do not donate sperm while taking  this medicine and for at least 4 months after you stop taking this medicine. Men should inform their doctors if they wish to father a child. This medicine may lower sperm counts. What side effects may I notice from receiving this medicine? Side effects that you should report to your doctor or health care professional as soon as possible: -allergic reactions like skin rash, itching or hives, swelling of the face, lips, or tongue -low blood counts - this medicine may decrease the number of white blood cells, red blood cells and platelets. You may be at increased risk for infections and bleeding. -signs of infection - fever or chills, cough, sore throat, pain or difficulty passing urine -signs of decreased platelets or bleeding - bruising, pinpoint red spots on the skin, black, tarry stools, blood in the urine -signs of decreased red blood cells - unusually weak or tired, fainting spells, lightheadedness -breathing problems -changes in vision -mouth or throat sores or ulcers -pain, redness, swelling or irritation at the injection site -pain, tingling, numbness in the hands or feet -redness, blistering, peeling or loosening of the skin, including inside the mouth -seizures -vomiting Side effects that usually do not require medical attention (report to your doctor or health care professional if they continue or are bothersome): -diarrhea -hair loss -loss of appetite -nausea -stomach pain This list may not describe all possible side effects. Call your doctor for medical advice about side effects. You may report side effects to FDA at 1-800-FDA-1088. Where should I keep my medicine? This drug is given in a hospital or clinic and will not be stored at home. NOTE: This sheet is a summary. It may not cover all possible information. If you have questions about this medicine, talk to your doctor, pharmacist, or health care provider.  2017 Elsevier/Gold Standard (2015-09-20 11:53:23)

## 2016-12-07 NOTE — Progress Notes (Signed)
Pt tolerating infusion well. Discharge instructions verbally reviewed and printed. Pt and patient's son verbalized understanding.

## 2016-12-08 ENCOUNTER — Other Ambulatory Visit: Payer: Self-pay | Admitting: *Deleted

## 2016-12-08 ENCOUNTER — Ambulatory Visit (HOSPITAL_BASED_OUTPATIENT_CLINIC_OR_DEPARTMENT_OTHER): Payer: Medicare Other

## 2016-12-08 VITALS — BP 118/72 | HR 59 | Temp 97.5°F

## 2016-12-08 DIAGNOSIS — Z5111 Encounter for antineoplastic chemotherapy: Secondary | ICD-10-CM

## 2016-12-08 DIAGNOSIS — C3492 Malignant neoplasm of unspecified part of left bronchus or lung: Secondary | ICD-10-CM

## 2016-12-08 DIAGNOSIS — C3432 Malignant neoplasm of lower lobe, left bronchus or lung: Secondary | ICD-10-CM | POA: Diagnosis not present

## 2016-12-08 MED ORDER — DEXAMETHASONE SODIUM PHOSPHATE 10 MG/ML IJ SOLN
10.0000 mg | Freq: Once | INTRAMUSCULAR | Status: AC
  Administered 2016-12-08: 10 mg via INTRAVENOUS

## 2016-12-08 MED ORDER — SODIUM CHLORIDE 0.9% FLUSH
10.0000 mL | INTRAVENOUS | Status: DC | PRN
  Administered 2016-12-08: 10 mL
  Filled 2016-12-08: qty 10

## 2016-12-08 MED ORDER — DEXAMETHASONE SODIUM PHOSPHATE 10 MG/ML IJ SOLN
INTRAMUSCULAR | Status: AC
  Filled 2016-12-08: qty 1

## 2016-12-08 MED ORDER — HEPARIN SOD (PORK) LOCK FLUSH 100 UNIT/ML IV SOLN
500.0000 [IU] | Freq: Once | INTRAVENOUS | Status: AC | PRN
  Administered 2016-12-08: 500 [IU]
  Filled 2016-12-08: qty 5

## 2016-12-08 MED ORDER — SODIUM CHLORIDE 0.9 % IV SOLN: 10.0000 mg | Freq: Once | INTRAVENOUS | Status: DC

## 2016-12-08 MED ORDER — SODIUM CHLORIDE 0.9 % IV SOLN
Freq: Once | INTRAVENOUS | Status: AC
  Administered 2016-12-08: 13:00:00 via INTRAVENOUS

## 2016-12-08 MED ORDER — SODIUM CHLORIDE 0.9 % IV SOLN
100.0000 mg/m2 | Freq: Once | INTRAVENOUS | Status: AC
  Administered 2016-12-08: 190 mg via INTRAVENOUS
  Filled 2016-12-08: qty 9.5

## 2016-12-08 NOTE — Patient Instructions (Signed)
[facility] Discharge Instructions for Patients Receiving Chemotherapy  Today you received the following chemotherapy agents Etoposide.   To help prevent nausea and vomiting after your treatment, we encourage you to take your nausea medication as prescribed.   If you develop nausea and vomiting that is not controlled by your nausea medication, call the clinic.   BELOW ARE SYMPTOMS THAT SHOULD BE REPORTED IMMEDIATELY:  *FEVER GREATER THAN 100.5 F  *CHILLS WITH OR WITHOUT FEVER  NAUSEA AND VOMITING THAT IS NOT CONTROLLED WITH YOUR NAUSEA MEDICATION  *UNUSUAL SHORTNESS OF BREATH  *UNUSUAL BRUISING OR BLEEDING  TENDERNESS IN MOUTH AND THROAT WITH OR WITHOUT PRESENCE OF ULCERS  *URINARY PROBLEMS  *BOWEL PROBLEMS  UNUSUAL RASH Items with * indicate a potential emergency and should be followed up as soon as possible.  Feel free to call the clinic you have any questions or concerns. The clinic phone number is [phone].  Please show the CHEMO ALERT CARD at check-in to the Emergency Department and triage nurse.   

## 2016-12-09 ENCOUNTER — Ambulatory Visit (HOSPITAL_BASED_OUTPATIENT_CLINIC_OR_DEPARTMENT_OTHER): Payer: Medicare Other

## 2016-12-09 VITALS — BP 131/77 | HR 66 | Temp 97.8°F | Resp 17

## 2016-12-09 DIAGNOSIS — Z5111 Encounter for antineoplastic chemotherapy: Secondary | ICD-10-CM

## 2016-12-09 DIAGNOSIS — C3492 Malignant neoplasm of unspecified part of left bronchus or lung: Secondary | ICD-10-CM

## 2016-12-09 DIAGNOSIS — C3432 Malignant neoplasm of lower lobe, left bronchus or lung: Secondary | ICD-10-CM

## 2016-12-09 MED ORDER — DEXAMETHASONE SODIUM PHOSPHATE 10 MG/ML IJ SOLN
INTRAMUSCULAR | Status: AC
  Filled 2016-12-09: qty 1

## 2016-12-09 MED ORDER — SODIUM CHLORIDE 0.9 % IV SOLN
Freq: Once | INTRAVENOUS | Status: AC
  Administered 2016-12-09: 08:00:00 via INTRAVENOUS

## 2016-12-09 MED ORDER — SODIUM CHLORIDE 0.9 % IV SOLN
100.0000 mg/m2 | Freq: Once | INTRAVENOUS | Status: AC
  Administered 2016-12-09: 190 mg via INTRAVENOUS
  Filled 2016-12-09: qty 9.5

## 2016-12-09 MED ORDER — HEPARIN SOD (PORK) LOCK FLUSH 100 UNIT/ML IV SOLN
500.0000 [IU] | Freq: Once | INTRAVENOUS | Status: AC | PRN
  Administered 2016-12-09: 500 [IU]
  Filled 2016-12-09: qty 5

## 2016-12-09 MED ORDER — DEXAMETHASONE SODIUM PHOSPHATE 100 MG/10ML IJ SOLN: 10.0000 mg | Freq: Once | INTRAMUSCULAR | Status: DC

## 2016-12-09 MED ORDER — SODIUM CHLORIDE 0.9% FLUSH
10.0000 mL | INTRAVENOUS | Status: DC | PRN
  Administered 2016-12-09: 10 mL
  Filled 2016-12-09: qty 10

## 2016-12-09 MED ORDER — DEXAMETHASONE SODIUM PHOSPHATE 10 MG/ML IJ SOLN
10.0000 mg | Freq: Once | INTRAMUSCULAR | Status: AC
  Administered 2016-12-09: 10 mg via INTRAVENOUS

## 2016-12-09 NOTE — Patient Instructions (Signed)
  [facility] Discharge Instructions for Patients Receiving Chemotherapy  Today you received the following chemotherapy agents: Etoposide   To help prevent nausea and vomiting after your treatment, we encourage you to take your nausea medication as directed.    If you develop nausea and vomiting that is not controlled by your nausea medication, call the clinic.   BELOW ARE SYMPTOMS THAT SHOULD BE REPORTED IMMEDIATELY:  *FEVER GREATER THAN 100.5 F  *CHILLS WITH OR WITHOUT FEVER  NAUSEA AND VOMITING THAT IS NOT CONTROLLED WITH YOUR NAUSEA MEDICATION  *UNUSUAL SHORTNESS OF BREATH  *UNUSUAL BRUISING OR BLEEDING  TENDERNESS IN MOUTH AND THROAT WITH OR WITHOUT PRESENCE OF ULCERS  *URINARY PROBLEMS  *BOWEL PROBLEMS  UNUSUAL RASH Items with * indicate a potential emergency and should be followed up as soon as possible.  Feel free to call the clinic you have any questions or concerns. The clinic phone number is [phone].  Please show the CHEMO ALERT CARD at check-in to the Emergency Department and triage nurse.   

## 2016-12-10 ENCOUNTER — Other Ambulatory Visit: Payer: Self-pay | Admitting: Nurse Practitioner

## 2016-12-11 ENCOUNTER — Ambulatory Visit: Payer: Self-pay

## 2016-12-11 ENCOUNTER — Ambulatory Visit (HOSPITAL_BASED_OUTPATIENT_CLINIC_OR_DEPARTMENT_OTHER): Payer: Medicare Other

## 2016-12-11 VITALS — BP 117/67 | HR 76 | Temp 98.0°F | Resp 18

## 2016-12-11 DIAGNOSIS — C3432 Malignant neoplasm of lower lobe, left bronchus or lung: Secondary | ICD-10-CM | POA: Diagnosis present

## 2016-12-11 DIAGNOSIS — C3492 Malignant neoplasm of unspecified part of left bronchus or lung: Secondary | ICD-10-CM

## 2016-12-11 DIAGNOSIS — Z5189 Encounter for other specified aftercare: Secondary | ICD-10-CM | POA: Diagnosis not present

## 2016-12-11 MED ORDER — PEGFILGRASTIM INJECTION 6 MG/0.6ML ~~LOC~~
6.0000 mg | PREFILLED_SYRINGE | Freq: Once | SUBCUTANEOUS | Status: AC
  Administered 2016-12-11: 6 mg via SUBCUTANEOUS
  Filled 2016-12-11: qty 0.6

## 2016-12-11 NOTE — Patient Instructions (Signed)
Pegfilgrastim injection What is this medicine? PEGFILGRASTIM (PEG fil gra stim) is a long-acting granulocyte colony-stimulating factor that stimulates the growth of neutrophils, a type of white blood cell important in the body's fight against infection. It is used to reduce the incidence of fever and infection in patients with certain types of cancer who are receiving chemotherapy that affects the bone marrow, and to increase survival after being exposed to high doses of radiation. This medicine may be used for other purposes; ask your health care provider or pharmacist if you have questions. COMMON BRAND NAME(S): Neulasta What should I tell my health care provider before I take this medicine? They need to know if you have any of these conditions: -kidney disease -latex allergy -ongoing radiation therapy -sickle cell disease -skin reactions to acrylic adhesives (On-Body Injector only) -an unusual or allergic reaction to pegfilgrastim, filgrastim, other medicines, foods, dyes, or preservatives -pregnant or trying to get pregnant -breast-feeding How should I use this medicine? This medicine is for injection under the skin. If you get this medicine at home, you will be taught how to prepare and give the pre-filled syringe or how to use the On-body Injector. Refer to the patient Instructions for Use for detailed instructions. Use exactly as directed. Tell your healthcare provider immediately if you suspect that the On-body Injector may not have performed as intended or if you suspect the use of the On-body Injector resulted in a missed or partial dose. It is important that you put your used needles and syringes in a special sharps container. Do not put them in a trash can. If you do not have a sharps container, call your pharmacist or healthcare provider to get one. Talk to your pediatrician regarding the use of this medicine in children. While this drug may be prescribed for selected conditions,  precautions do apply. Overdosage: If you think you have taken too much of this medicine contact a poison control center or emergency room at once. NOTE: This medicine is only for you. Do not share this medicine with others. What if I miss a dose? It is important not to miss your dose. Call your doctor or health care professional if you miss your dose. If you miss a dose due to an On-body Injector failure or leakage, a new dose should be administered as soon as possible using a single prefilled syringe for manual use. What may interact with this medicine? Interactions have not been studied. Give your health care provider a list of all the medicines, herbs, non-prescription drugs, or dietary supplements you use. Also tell them if you smoke, drink alcohol, or use illegal drugs. Some items may interact with your medicine. This list may not describe all possible interactions. Give your health care provider a list of all the medicines, herbs, non-prescription drugs, or dietary supplements you use. Also tell them if you smoke, drink alcohol, or use illegal drugs. Some items may interact with your medicine. What should I watch for while using this medicine? You may need blood work done while you are taking this medicine. If you are going to need a MRI, CT scan, or other procedure, tell your doctor that you are using this medicine (On-Body Injector only). What side effects may I notice from receiving this medicine? Side effects that you should report to your doctor or health care professional as soon as possible: -allergic reactions like skin rash, itching or hives, swelling of the face, lips, or tongue -dizziness -fever -pain, redness, or irritation at site   where injected -pinpoint red spots on the skin -red or dark-brown urine -shortness of breath or breathing problems -stomach or side pain, or pain at the shoulder -swelling -tiredness -trouble passing urine or change in the amount of urine Side  effects that usually do not require medical attention (report to your doctor or health care professional if they continue or are bothersome): -bone pain -muscle pain This list may not describe all possible side effects. Call your doctor for medical advice about side effects. You may report side effects to FDA at 1-800-FDA-1088. Where should I keep my medicine? Keep out of the reach of children. Store pre-filled syringes in a refrigerator between 2 and 8 degrees C (36 and 46 degrees F). Do not freeze. Keep in carton to protect from light. Throw away this medicine if it is left out of the refrigerator for more than 48 hours. Throw away any unused medicine after the expiration date. NOTE: This sheet is a summary. It may not cover all possible information. If you have questions about this medicine, talk to your doctor, pharmacist, or health care provider.  2018 [employer] (2016-09-24 12:58:03)  

## 2016-12-15 ENCOUNTER — Other Ambulatory Visit (HOSPITAL_BASED_OUTPATIENT_CLINIC_OR_DEPARTMENT_OTHER): Payer: Medicare Other

## 2016-12-15 ENCOUNTER — Encounter: Payer: Self-pay | Admitting: Internal Medicine

## 2016-12-15 ENCOUNTER — Ambulatory Visit (HOSPITAL_BASED_OUTPATIENT_CLINIC_OR_DEPARTMENT_OTHER): Payer: Medicare Other

## 2016-12-15 ENCOUNTER — Ambulatory Visit (HOSPITAL_BASED_OUTPATIENT_CLINIC_OR_DEPARTMENT_OTHER): Payer: Medicare Other | Admitting: Internal Medicine

## 2016-12-15 ENCOUNTER — Telehealth: Payer: Self-pay | Admitting: Internal Medicine

## 2016-12-15 VITALS — BP 126/65 | HR 72 | Temp 97.9°F | Resp 18 | Ht 70.0 in | Wt 159.4 lb

## 2016-12-15 DIAGNOSIS — C3432 Malignant neoplasm of lower lobe, left bronchus or lung: Secondary | ICD-10-CM

## 2016-12-15 DIAGNOSIS — Z95828 Presence of other vascular implants and grafts: Secondary | ICD-10-CM

## 2016-12-15 DIAGNOSIS — C3492 Malignant neoplasm of unspecified part of left bronchus or lung: Secondary | ICD-10-CM

## 2016-12-15 DIAGNOSIS — Z5111 Encounter for antineoplastic chemotherapy: Secondary | ICD-10-CM

## 2016-12-15 LAB — CBC WITH DIFFERENTIAL/PLATELET
BASO%: 0.7 % (ref 0.0–2.0)
BASOS ABS: 0 10*3/uL (ref 0.0–0.1)
EOS ABS: 0.2 10*3/uL (ref 0.0–0.5)
EOS%: 5 % (ref 0.0–7.0)
HEMATOCRIT: 32.7 % — AB (ref 38.4–49.9)
HEMOGLOBIN: 10.7 g/dL — AB (ref 13.0–17.1)
LYMPH#: 2.9 10*3/uL (ref 0.9–3.3)
LYMPH%: 61.6 % — ABNORMAL HIGH (ref 14.0–49.0)
MCH: 26.7 pg — AB (ref 27.2–33.4)
MCHC: 32.8 g/dL (ref 32.0–36.0)
MCV: 81.4 fL (ref 79.3–98.0)
MONO#: 0.2 10*3/uL (ref 0.1–0.9)
MONO%: 5.1 % (ref 0.0–14.0)
NEUT#: 1.3 10*3/uL — ABNORMAL LOW (ref 1.5–6.5)
NEUT%: 27.6 % — AB (ref 39.0–75.0)
PLATELETS: 129 10*3/uL — AB (ref 140–400)
RBC: 4.01 10*6/uL — ABNORMAL LOW (ref 4.20–5.82)
RDW: 16.1 % — ABNORMAL HIGH (ref 11.0–14.6)
WBC: 4.7 10*3/uL (ref 4.0–10.3)

## 2016-12-15 LAB — COMPREHENSIVE METABOLIC PANEL
ALBUMIN: 3.2 g/dL — AB (ref 3.5–5.0)
ALK PHOS: 95 U/L (ref 40–150)
ALT: 15 U/L (ref 0–55)
ANION GAP: 6 meq/L (ref 3–11)
AST: 17 U/L (ref 5–34)
BUN: 22 mg/dL (ref 7.0–26.0)
CALCIUM: 9.4 mg/dL (ref 8.4–10.4)
CHLORIDE: 104 meq/L (ref 98–109)
CO2: 29 mEq/L (ref 22–29)
Creatinine: 0.9 mg/dL (ref 0.7–1.3)
EGFR: 78 mL/min/{1.73_m2} — ABNORMAL LOW (ref >90)
Glucose: 103 mg/dl (ref 70–140)
POTASSIUM: 4.2 meq/L (ref 3.5–5.1)
Sodium: 139 mEq/L (ref 136–145)
Total Bilirubin: 0.46 mg/dL (ref 0.20–1.20)
Total Protein: 6.5 g/dL (ref 6.4–8.3)

## 2016-12-15 MED ORDER — HEPARIN SOD (PORK) LOCK FLUSH 100 UNIT/ML IV SOLN
500.0000 [IU] | INTRAVENOUS | Status: AC | PRN
  Administered 2016-12-15: 500 [IU]
  Filled 2016-12-15: qty 5

## 2016-12-15 MED ORDER — SODIUM CHLORIDE 0.9% FLUSH
10.0000 mL | INTRAVENOUS | Status: AC | PRN
  Administered 2016-12-15: 10 mL
  Filled 2016-12-15: qty 10

## 2016-12-15 NOTE — Patient Instructions (Signed)
Implanted Port Home Guide An implanted port is a type of central line that is placed under the skin. Central lines are used to provide IV access when treatment or nutrition needs to be given through a person's veins. Implanted ports are used for long-term IV access. An implanted port may be placed because:  You need IV medicine that would be irritating to the small veins in your hands or arms.  You need long-term IV medicines, such as antibiotics.  You need IV nutrition for a long period.  You need frequent blood draws for lab tests.  You need dialysis.  Implanted ports are usually placed in the chest area, but they can also be placed in the upper arm, the abdomen, or the leg. An implanted port has two main parts:  Reservoir. The reservoir is round and will appear as a small, raised area under your skin. The reservoir is the part where a needle is inserted to give medicines or draw blood.  Catheter. The catheter is a thin, flexible tube that extends from the reservoir. The catheter is placed into a large vein. Medicine that is inserted into the reservoir goes into the catheter and then into the vein.  How will I care for my incision site? Do not get the incision site wet. Bathe or shower as directed by your health care provider. How is my port accessed? Special steps must be taken to access the port:  Before the port is accessed, a numbing cream can be placed on the skin. This helps numb the skin over the port site.  Your health care provider uses a sterile technique to access the port. ? Your health care provider must put on a mask and sterile gloves. ? The skin over your port is cleaned carefully with an antiseptic and allowed to dry. ? The port is gently pinched between sterile gloves, and a needle is inserted into the port.  Only "non-coring" port needles should be used to access the port. Once the port is accessed, a blood return should be checked. This helps ensure that the port  is in the vein and is not clogged.  If your port needs to remain accessed for a constant infusion, a clear (transparent) bandage will be placed over the needle site. The bandage and needle will need to be changed every week, or as directed by your health care provider.  Keep the bandage covering the needle clean and dry. Do not get it wet. Follow your health care provider's instructions on how to take a shower or bath while the port is accessed.  If your port does not need to stay accessed, no bandage is needed over the port.  What is flushing? Flushing helps keep the port from getting clogged. Follow your health care provider's instructions on how and when to flush the port. Ports are usually flushed with saline solution or a medicine called heparin. The need for flushing will depend on how the port is used.  If the port is used for intermittent medicines or blood draws, the port will need to be flushed: ? After medicines have been given. ? After blood has been drawn. ? As part of routine maintenance.  If a constant infusion is running, the port may not need to be flushed.  How long will my port stay implanted? The port can stay in for as long as your health care provider thinks it is needed. When it is time for the port to come out, surgery will be   done to remove it. The procedure is similar to the one performed when the port was put in. When should I seek immediate medical care? When you have an implanted port, you should seek immediate medical care if:  You notice a bad smell coming from the incision site.  You have swelling, redness, or drainage at the incision site.  You have more swelling or pain at the port site or the surrounding area.  You have a fever that is not controlled with medicine.  This information is not intended to replace advice given to you by your health care provider. Make sure you discuss any questions you have with your health care provider. Document  Released: 09/28/2005 Document Revised: 03/05/2016 Document Reviewed: 06/05/2013 [employer] Interactive Patient Education  2017 [employer]  

## 2016-12-15 NOTE — Telephone Encounter (Signed)
Appointments scheduled per 3/6 LOS. Patient given AVS report and calendars with future scheduled appointments. °

## 2016-12-15 NOTE — Progress Notes (Signed)
[place] Telephone:([phone]   Fax:([phone]  OFFICE PROGRESS NOTE  [name], NP [place] [address]  DIAGNOSIS: Stage IV (T3, N2, M0) small cell lung cancer diagnosed in February 2018 and presented with left lower lobe lung mass in addition to subcarinal lymphadenopathy.  PRIOR THERAPY: None  CURRENT THERAPY: Systemic chemotherapy with carboplatin for AUC of 5 on day 1 and etoposide 100 MG/M2 on days 1, 2 and 3 with Neulasta support. This will be concurrent with radiotherapy. Status post one cycle.  INTERVAL HISTORY: Christian Munoz 82 y.o. male returns to the clinic today for follow-up visit accompanied by his son. The patient tolerated the first week of his treatment with carboplatin and etoposide fairly well with no significant complaints except for mild fatigue after the Neulasta injection. He took Claritin and Tylenol with improvement of his symptoms. He has one or 2 episodes of mild nausea improved with Compazine. He denied having any fever or chills. He has no current chest pain, shortness of breath, cough or hemoptysis. He is here today for evaluation and repeat blood work. His recent MRI of the brain showed no evidence of metastatic disease to the brain.  MEDICAL HISTORY: Past Medical History:  Diagnosis Date  . Allergic rhinitis due to pollen   . Anginal pain ([place])    Past medical history " does not seem to be a problem at all now."  . Coronary atherosclerosis of native coronary artery   . Coronary atherosclerosis of native coronary artery   . Depressive disorder, not elsewhere classified   . Encounter for antineoplastic chemotherapy 11/30/2016  . GERD (gastroesophageal reflux disease)   . Goals of care, counseling/discussion 11/30/2016  . Headache    PMH: Migraines  . Herpes genitalia   . Hypertrophy of prostate without urinary obstruction and other lower urinary tract symptoms (LUTS)   . Hypertrophy of  prostate without urinary obstruction and other lower urinary tract symptoms (LUTS)   . Lack of coordination   . Lack of coordination   . Lumbago   . Nausea and vomiting 11/30/2016  . Osteoarthrosis, unspecified whether generalized or localized, unspecified site   . Osteoarthrosis, unspecified whether generalized or localized, unspecified site   . Other and unspecified hyperlipidemia   . Other malaise and fatigue   . Pneumonia   . Reflux esophagitis   . Skin cancer   . Sleep apnea    does not wear CPAP  . Syncope and collapse   . Urinary frequency   . Vitamin D deficiency     ALLERGIES:  is allergic to chantix [varenicline] and zoloft [sertraline hcl].  MEDICATIONS:  Current Outpatient Prescriptions  Medication Sig Dispense Refill  . aspirin 81 MG tablet Take 81 mg by mouth daily. Take one tablet once a day    . escitalopram (LEXAPRO) 5 MG tablet TAKE 1 TABLET(5 MG) BY MOUTH EVERY MORNING 30 tablet 0  . fish oil-omega-3 fatty acids 1000 MG capsule Take 2 g by mouth daily. Take one tablet twice a day    . Glucosamine-Chondroit-Vit C-Mn (GLUCOSAMINE 1500 COMPLEX) CAPS Take 2 capsules by mouth daily.    Marland Kitchen lidocaine-prilocaine (EMLA) cream Apply 1 application topically as needed. 30 g 0  . metoprolol tartrate (LOPRESSOR) 25 MG tablet TAKE 1 TABLET BY MOUTH TWICE DAILY 180 tablet 1  . Multiple Vitamins-Minerals (MULTIVITAMIN WITH MINERALS) tablet Take 1 tablet by mouth daily.    . Multiple Vitamins-Minerals (PRESERVISION AREDS  2) CAPS Take 2 capsules by mouth daily.    . pantoprazole (PROTONIX) 20 MG tablet TAKE 1 TABLET(20 MG) BY MOUTH DAILY 90 tablet 1  . prochlorperazine (COMPAZINE) 10 MG tablet Take 1 tablet (10 mg total) by mouth every 6 (six) hours as needed for nausea or vomiting. 30 tablet 0  . QUEtiapine (SEROQUEL) 25 MG tablet Take 1 tablet (25 mg total) by mouth at bedtime. 1/2- 1 pill po qhs 30 tablet 1  . simvastatin (ZOCOR) 20 MG tablet TAKE 1 TABLET(20 MG) BY MOUTH DAILY 90  tablet 2  . tamsulosin (FLOMAX) 0.4 MG CAPS capsule TAKE 1 CAPSULE(0.4 MG) BY MOUTH DAILY 90 capsule 0   No current facility-administered medications for this visit.     SURGICAL HISTORY:  Past Surgical History:  Procedure Laterality Date  . CARDIAC CATHETERIZATION    . COLONOSCOPY    . EYE SURGERY    . IR GENERIC HISTORICAL  12/04/2016   IR US GUIDE VASC ACCESS RIGHT 12/04/2016 Greggory Keen, MD [facility]  . IR GENERIC HISTORICAL  12/04/2016   IR FLUORO GUIDE PORT INSERTION RIGHT 12/04/2016 Greggory Keen, MD [facility]  . poly vocal cords  1985  . SKIN CANCER EXCISION  2011  . TONSILLECTOMY    . VASECTOMY    . VIDEO BRONCHOSCOPY WITH ENDOBRONCHIAL ULTRASOUND N/A 11/20/2016   Procedure: VIDEO BRONCHOSCOPY WITH ENDOBRONCHIAL ULTRASOUND;  Surgeon: Collene Gobble, MD;  Location: [facility];  Service: Thoracic;  Laterality: N/A;    REVIEW OF SYSTEMS:  A comprehensive review of systems was negative except for: Constitutional: positive for fatigue   PHYSICAL EXAMINATION: General appearance: alert, cooperative and no distress Head: Normocephalic, without obvious abnormality, atraumatic Neck: no adenopathy, no JVD, supple, symmetrical, trachea midline and thyroid not enlarged, symmetric, no tenderness/mass/nodules Lymph nodes: Cervical, supraclavicular, and axillary nodes normal. Resp: clear to auscultation bilaterally Back: symmetric, no curvature. ROM normal. No CVA tenderness. Cardio: regular rate and rhythm, S1, S2 normal, no murmur, click, rub or gallop GI: soft, non-tender; bowel sounds normal; no masses,  no organomegaly Extremities: extremities normal, atraumatic, no cyanosis or edema  ECOG PERFORMANCE STATUS: 1 - Symptomatic but completely ambulatory  Blood pressure 126/65, pulse 72, temperature 97.9 F (36.6 C), temperature source Oral, resp. rate 18, height '5\' 10"'$  (1.778 m), weight 159 lb 6.4 oz (72.3 kg), SpO2 95 %.  LABORATORY DATA: Lab Results  Component Value Date   WBC  4.7 12/15/2016   HGB 10.7 (L) 12/15/2016   HCT 32.7 (L) 12/15/2016   MCV 81.4 12/15/2016   PLT 129 (L) 12/15/2016      Chemistry      Component Value Date/Time   NA 139 12/15/2016 1017   K 4.2 12/15/2016 1017   CL 105 12/04/2016 0814   CO2 29 12/15/2016 1017   BUN 22.0 12/15/2016 1017   CREATININE 0.9 12/15/2016 1017      Component Value Date/Time   CALCIUM 9.4 12/15/2016 1017   ALKPHOS 95 12/15/2016 1017   AST 17 12/15/2016 1017   ALT 15 12/15/2016 1017   BILITOT 0.46 12/15/2016 1017       RADIOGRAPHIC STUDIES: Mr Jeri Cos DU Contrast  Result Date: 12/05/2016 CLINICAL DATA:  82 y/o  M; small cell lung cancer, initial staging. EXAM: MRI HEAD WITHOUT AND WITH CONTRAST TECHNIQUE: Multiplanar, multiecho pulse sequences of the brain and surrounding structures were obtained without and with intravenous contrast. CONTRAST:  96m MULTIHANCE GADOBENATE DIMEGLUMINE 529 MG/ML IV SOLN COMPARISON:  None. FINDINGS:  Brain: Partially empty sella turcica. Few scattered foci of T2 FLAIR hyperintense signal abnormality in subcortical and periventricular white matter and in the pons are compatible with mild chronic microvascular ischemic changes for age. Mild brain parenchymal volume loss for age. No abnormal susceptibility hypointensity to suggest intracranial hemorrhage. No focal mass effect. No extra-axial collection. No hydrocephalus. No abnormal enhancement of the brain. Vascular: Normal flow voids. Skull and upper cervical spine: Normal marrow signal. Sinuses/Orbits: Negative. Other: None. IMPRESSION: 1. No evidence of acute intracranial abnormality or intracranial metastasis. 2. Mild chronic microvascular ischemic changes and parenchymal volume loss of the brain for age. Electronically Signed   By: Kristine Garbe M.D.   On: 12/05/2016 17:43   Nm Pet Image Initial (pi) Skull Base To Thigh  Result Date: 12/03/2016 CLINICAL DATA:  Initial treatment strategy for small-cell lung cancer.  EXAM: NUCLEAR MEDICINE PET SKULL BASE TO THIGH TECHNIQUE: 7.9 mCi F-18 FDG was injected intravenously. Full-ring PET imaging was performed from the skull base to thigh after the radiotracer. CT data was obtained and used for attenuation correction and anatomic localization. FASTING BLOOD GLUCOSE:  Value: 111 mg/dl COMPARISON:  Chest CT 10/23/2016. FINDINGS: NECK No hypermetabolic lymph nodes in the neck. CHEST Left infrahilar lesion is markedly hypermetabolic with SUV max = 22. Low level FDG uptake is identified in subcarinal lymph nodes that are not enlarged by CT criteria. Left lower lobe peripheral airspace opacity and effusion show low level FDG uptake. ABDOMEN/PELVIS No abnormal hypermetabolic activity within the liver, pancreas, adrenal glands, or spleen. No hypermetabolic lymph nodes in the abdomen or pelvis. Multiple cysts are seen in the liver. Numerous bilateral nonobstructing renal calculi are evident. There is abdominal aortic atherosclerosis without aneurysm. Diverticular changes noted diffusely in the colon. The prostate gland is enlarged. Scattered areas of FDG uptake are identified in the terminal ileum and segments of the colon, likely physiologic. SKELETON Small foci of uptake are identified in the T12 and L1 vertebral bodies without underlying cortical lesion on CT imaging. There is also increased activity in the L3 and L4 spinous processes that show areas of heterogeneous mineralization and expansion on CT images. IMPRESSION: 1. Markedly hypermetabolic left infrahilar mass, consistent with known neoplasm. Adjacent airspace disease shows FDG uptake in may be postobstructive inflammatory change but tumor spread is not entirely excluded. 2. Low level uptake identified and nonenlarged subcarinal lymph nodes. Metastatic disease is a possibility. 3. Small foci of uptake in the T12 and L1 vertebral bodies without associated bony abnormality on CT imaging. There is also uptake in the L3 and L4 spinous  processes did show evidence of heterogeneous mineralization and expansion. Given the consecutive involvement of the spinous processes, this is probably degenerative. Bone scan or MRI of the lumbar spine without with contrast could be used to further evaluate, as clinically warranted. Electronically Signed   By: Misty Stanley M.D.   On: 12/03/2016 18:12   Ir US Guide Vasc Access Right  Result Date: 12/04/2016 CLINICAL DATA:  Left small cell lung cancer, access for chemotherapy EXAM: RIGHT INTERNAL JUGULAR SINGLE LUMEN POWER PORT CATHETER INSERTION Date:  2/23/20182/23/2018 10:59 am Radiologist:  M. Daryll Brod, MD Guidance:  Ultrasound and fluoroscopic MEDICATIONS: Ancef 2 g; The antibiotic was administered within an appropriate time interval prior to skin puncture. ANESTHESIA/SEDATION: Versed 1 mg IV; Fentanyl 75 mcg IV; Moderate Sedation Time:  27 minutes The patient was continuously monitored during the procedure by the interventional radiology nurse under my direct supervision. FLUOROSCOPY TIME:  1  minutes, 6 seconds (3 mGy) COMPLICATIONS: None immediate. CONTRAST:  None. PROCEDURE: Informed consent was obtained from the patient following explanation of the procedure, risks, benefits and alternatives. The patient understands, agrees and consents for the procedure. All questions were addressed. A time out was performed. Maximal barrier sterile technique utilized including caps, mask, sterile gowns, sterile gloves, large sterile drape, hand hygiene, and 2% chlorhexidine scrub. Under sterile conditions and local anesthesia, right internal jugular micropuncture venous access was performed. Access was performed with ultrasound. Images were obtained for documentation. A guide wire was inserted followed by a transitional dilator. This allowed insertion of a guide wire and catheter into the IVC. Measurements were obtained from the SVC / RA junction back to the right IJ venotomy site. In the right infraclavicular  chest, a subcutaneous pocket was created over the second anterior rib. This was done under sterile conditions and local anesthesia. 1% lidocaine with epinephrine was utilized for this. A 2.5 cm incision was made in the skin. Blunt dissection was performed to create a subcutaneous pocket over the right pectoralis major muscle. The pocket was flushed with saline vigorously. There was adequate hemostasis. The port catheter was assembled and checked for leakage. The port catheter was secured in the pocket with two retention sutures. The tubing was tunneled subcutaneously to the right venotomy site and inserted into the SVC/RA junction through a valved peel-away sheath. Position was confirmed with fluoroscopy. Images were obtained for documentation. The patient tolerated the procedure well. No immediate complications. Incisions were closed in a two layer fashion with 4 - 0 Vicryl suture. Dermabond was applied to the skin. The port catheter was accessed, blood was aspirated followed by saline and heparin flushes. Needle was removed. A dry sterile dressing was applied. IMPRESSION: Ultrasound and fluoroscopically guided right internal jugular single lumen power port catheter insertion. Tip in the SVC/RA junction. Catheter ready for use. Electronically Signed   By: Jerilynn Mages.  Shick M.D.   On: 12/04/2016 11:03   Ir Fluoro Guide Port Insertion Right  Result Date: 12/04/2016 CLINICAL DATA:  Left small cell lung cancer, access for chemotherapy EXAM: RIGHT INTERNAL JUGULAR SINGLE LUMEN POWER PORT CATHETER INSERTION Date:  2/23/20182/23/2018 10:59 am Radiologist:  M. Daryll Brod, MD Guidance:  Ultrasound and fluoroscopic MEDICATIONS: Ancef 2 g; The antibiotic was administered within an appropriate time interval prior to skin puncture. ANESTHESIA/SEDATION: Versed 1 mg IV; Fentanyl 75 mcg IV; Moderate Sedation Time:  27 minutes The patient was continuously monitored during the procedure by the interventional radiology nurse under my  direct supervision. FLUOROSCOPY TIME:  1 minutes, 6 seconds (3 mGy) COMPLICATIONS: None immediate. CONTRAST:  None. PROCEDURE: Informed consent was obtained from the patient following explanation of the procedure, risks, benefits and alternatives. The patient understands, agrees and consents for the procedure. All questions were addressed. A time out was performed. Maximal barrier sterile technique utilized including caps, mask, sterile gowns, sterile gloves, large sterile drape, hand hygiene, and 2% chlorhexidine scrub. Under sterile conditions and local anesthesia, right internal jugular micropuncture venous access was performed. Access was performed with ultrasound. Images were obtained for documentation. A guide wire was inserted followed by a transitional dilator. This allowed insertion of a guide wire and catheter into the IVC. Measurements were obtained from the SVC / RA junction back to the right IJ venotomy site. In the right infraclavicular chest, a subcutaneous pocket was created over the second anterior rib. This was done under sterile conditions and local anesthesia. 1% lidocaine with epinephrine was utilized  for this. A 2.5 cm incision was made in the skin. Blunt dissection was performed to create a subcutaneous pocket over the right pectoralis major muscle. The pocket was flushed with saline vigorously. There was adequate hemostasis. The port catheter was assembled and checked for leakage. The port catheter was secured in the pocket with two retention sutures. The tubing was tunneled subcutaneously to the right venotomy site and inserted into the SVC/RA junction through a valved peel-away sheath. Position was confirmed with fluoroscopy. Images were obtained for documentation. The patient tolerated the procedure well. No immediate complications. Incisions were closed in a two layer fashion with 4 - 0 Vicryl suture. Dermabond was applied to the skin. The port catheter was accessed, blood was aspirated  followed by saline and heparin flushes. Needle was removed. A dry sterile dressing was applied. IMPRESSION: Ultrasound and fluoroscopically guided right internal jugular single lumen power port catheter insertion. Tip in the SVC/RA junction. Catheter ready for use. Electronically Signed   By: Jerilynn Mages.  Shick M.D.   On: 12/04/2016 11:03    ASSESSMENT AND PLAN: This is a very pleasant 82 years old white male recently diagnosed with limited stage small cell lung cancer and currently undergoing systemic chemotherapy with carboplatin and etoposide status post 1 cycle. His recent imaging studies showed no evidence for disease metastasis. I recommended for the patient to continue his current treatment with carboplatin and etoposide and he will start cycle #2 in 2 weeks. I also recommended for the patient to see radiation oncology for consideration of concurrent radiotherapy with his chemotherapy. I will see the patient back for follow-up visit in 2 weeks with the start of cycle #2. He was advised to call immediately if he has any concerning symptoms in the interval. The patient voices understanding of current disease status and treatment options and is in agreement with the current care plan. All questions were answered. The patient knows to call the clinic with any problems, questions or concerns. We can certainly see the patient much sooner if necessary.  I spent 10 minutes counseling the patient face to face. The total time spent in the appointment was 15 minutes.  Disclaimer: This note was dictated with voice recognition software. Similar sounding words can inadvertently be transcribed and may not be corrected upon review.

## 2016-12-16 ENCOUNTER — Encounter: Payer: Self-pay | Admitting: Radiation Oncology

## 2016-12-17 ENCOUNTER — Ambulatory Visit: Payer: Self-pay | Admitting: Emergency Medicine

## 2016-12-21 NOTE — Progress Notes (Signed)
Thoracic Location of Tumor / Histology: Stage IV (T3, N2, M0) small cell lung cancer diagnosed in February 2018 and presented with left lower lobe lung mass in addition to subcarinal lymphadenopathy   Patient presented to [facility] (Dr. Lamonte Sakai) on 11/13/16 with low back pain and flank pleuritic pain that started two weeks prior. He was being treatment for pneumonia at the same time. CT chest revealed PE, proximal mass, and satellite lesion.   Biopsies of left lower superior segment (if applicable) revealed:    Tobacco/Marijuana/Snuff/ETOH use: former smoker  Past/Anticipated interventions by cardiothoracic surgery, if any: no  Past/Anticipated interventions by medical oncology, if any:  PRIOR THERAPY: None  CURRENT THERAPY: Systemic chemotherapy with carboplatin for AUC of 5 on day 1 and etoposide 100 MG/M2 on days 1, 2 and 3 with Neulasta support. This will be concurrent with radiotherapy. Status post one cycle.  Signs/Symptoms Weight changes, if any: Wt Readings from Last 3 Encounters:  12/24/16 159 lb 3.2 oz (72.2 kg)  12/15/16 159 lb 6.4 oz (72.3 kg)  12/04/16 159 lb (72.1 kg)     Respiratory complaints, if any: SOB with exertion, dry coughing occasional   Hemoptysis, if any: no  Pain issues, if any:  no  SAFETY ISSUES:  Prior radiation? No  Pacemaker/ICD? No  Possible current pregnancy?no  Is the patient on methotrexate? No  Current Complaints / other details:  82 year old male.   Christian Munoz is recommending for the patient to continue his current treatment with carboplatin and etoposide and he will start cycle #2 in 2 weeks. Also, he is recommending the patient to see radiation oncology for consideration of concurrent radiotherapy with his chemotherapy. BP 116/67   Pulse 76   Temp 98 F (36.7 C) (Oral)   Resp 16   Ht '5\' 10"'$  (1.778 m)   Wt 159 lb 3.2 oz (72.2 kg)   SpO2 95%   BMI 22.84 kg/m

## 2016-12-23 ENCOUNTER — Ambulatory Visit: Payer: Medicare Other | Admitting: Radiation Oncology

## 2016-12-23 ENCOUNTER — Ambulatory Visit: Payer: Medicare Other

## 2016-12-23 NOTE — Progress Notes (Signed)
Radiation Oncology         [phone] ________________________________  Initial outpatient Consultation  Name: Christian Munoz MRN: [id number]  Date: 12/24/2016  DOB: 1934-10-18  SN:[email or web address], Christian American, NP  Curt Bears, MD   REFERRING PHYSICIAN: Curt Bears, MD  DIAGNOSIS: 82 year-old gentleman with limited stage Small Cell Lung cancer in the left lower lung.    ICD-9-CM ICD-10-CM   1. Small cell lung cancer, left (HCC) 162.9 C34.92     HISTORY OF PRESENT ILLNESS: Christian Munoz is a 82 y.o. male seen at the request of Dr. Julien Nordmann for evaluation of newly diagnosed Small Cell Lung cancer in the left lung. He initially presented to his primary care physician in Jan. 2018 with complaints of pain in the left posterior chest. He had a chest x-ray performed on 10/23/16 which showed basilar atelectasis and pneumonia for which he was treated. He also had a CT chest angiogram on the same day for further evaluation and to rule out pulmonary embolism. This did not show evidence of pulmonary embolism but did demonstrate a masslike opacity in the left infrahilar region measuring 5.6 x 6.8 cm with occlusion of the segmental bronchi, segmental pulmonary artery and left inferior pulmonary vein felt most likely to represent bronchogenic carcinoma. There were additional groundglass opacities and nodules throughout the left lower lobe felt to represent postobstructive pneumonitis and/or lymphangitic carcinomatosis. Additionally, there was subcarinal lymphadenopathy measuring up to 13 mm as well as left infrahilar lymph nodes confluence with the masslike opacification in the infrahilar region.  He underwent fiberoptic bronchoscopy, performed by Dr. Lamonte Sakai on 11/30/2016. Pathology was consistent with Small Cell Carcinoma.  A PET scan was performed on 12/03/2016 for further evaluation and disease staging. This demonstrated the left infrahilar lesion to be markedly hypermetabolic with SUV max = 22.  There was low level uptake identified in the subcarinal lymph nodes. There was small foci of uptake in the T12 and L1 vertebral bodies without associated bony abnormality on CT imaging as well as uptake in L3 and L4 spinous processes which is felt most likely degenerative. MRI Brain 12/05/16 showed no evidence of intracranial metastasis.  He was started on systemic chemotherapy with carboplatin and etoposide with Neulasta support. He has completed one cycle. He presents today for discussion regarding the role of concurrent radiotherapy for treatment of his disease.   PREVIOUS RADIATION THERAPY: No  PAST MEDICAL HISTORY:  Past Medical History:  Diagnosis Date  . Allergic rhinitis due to pollen   . Anginal pain ([place])    Past medical history " does not seem to be a problem at all now."  . Coronary atherosclerosis of native coronary artery   . Coronary atherosclerosis of native coronary artery   . Depressive disorder, not elsewhere classified   . Encounter for antineoplastic chemotherapy 11/30/2016  . GERD (gastroesophageal reflux disease)   . Goals of care, counseling/discussion 11/30/2016  . Headache    PMH: Migraines  . Herpes genitalia   . Hypertrophy of prostate without urinary obstruction and other lower urinary tract symptoms (LUTS)   . Hypertrophy of prostate without urinary obstruction and other lower urinary tract symptoms (LUTS)   . Lack of coordination   . Lack of coordination   . Lumbago   . Nausea and vomiting 11/30/2016  . Osteoarthrosis, unspecified whether generalized or localized, unspecified site   . Osteoarthrosis, unspecified whether generalized or localized, unspecified site   . Other and unspecified hyperlipidemia   . Other malaise and fatigue   .  Pneumonia   . Reflux esophagitis   . Skin cancer   . Sleep apnea    does not wear CPAP  . Syncope and collapse   . Urinary frequency   . Vitamin D deficiency       PAST SURGICAL HISTORY: Past Surgical History:    Procedure Laterality Date  . CARDIAC CATHETERIZATION    . COLONOSCOPY    . EYE SURGERY    . IR GENERIC HISTORICAL  12/04/2016   IR US GUIDE VASC ACCESS RIGHT 12/04/2016 Greggory Keen, MD [facility]  . IR GENERIC HISTORICAL  12/04/2016   IR FLUORO GUIDE PORT INSERTION RIGHT 12/04/2016 Greggory Keen, MD [facility]  . poly vocal cords  1985  . SKIN CANCER EXCISION  2011  . TONSILLECTOMY    . VASECTOMY    . VIDEO BRONCHOSCOPY WITH ENDOBRONCHIAL ULTRASOUND N/A 11/20/2016   Procedure: VIDEO BRONCHOSCOPY WITH ENDOBRONCHIAL ULTRASOUND;  Surgeon: Collene Gobble, MD;  Location: [facility] OR;  Service: Thoracic;  Laterality: N/A;    FAMILY HISTORY:  Family History  Problem Relation Age of Onset  . Heart disease Father   . Cancer Father   . Cancer Brother   . Cancer Brother     SOCIAL HISTORY:  Social History   Social History  . Marital status: Divorced    Spouse name: N/A  . Number of children: N/A  . Years of education: N/A   Occupational History  . Not on file.   Social History Main Topics  . Smoking status: Former Smoker    Types: Cigarettes    Quit date: 08/12/2016  . Smokeless tobacco: Never Used     Comment: 3 per day  . Alcohol use No  . Drug use: No  . Sexual activity: Not Currently   Other Topics Concern  . Not on file   Social History Narrative  . No narrative on file    ALLERGIES: Chantix [varenicline] and Zoloft [sertraline hcl]  MEDICATIONS:  Current Outpatient Prescriptions  Medication Sig Dispense Refill  . aspirin 81 MG tablet Take 81 mg by mouth daily. Take one tablet once a day    . escitalopram (LEXAPRO) 5 MG tablet TAKE 1 TABLET(5 MG) BY MOUTH EVERY MORNING 30 tablet 0  . fish oil-omega-3 fatty acids 1000 MG capsule Take 2 g by mouth daily. Take one tablet twice a day    . lidocaine-prilocaine (EMLA) cream Apply 1 application topically as needed. 30 g 0  . metoprolol tartrate (LOPRESSOR) 25 MG tablet TAKE 1 TABLET BY MOUTH TWICE DAILY 180 tablet 1  .  Multiple Vitamins-Minerals (MULTIVITAMIN WITH MINERALS) tablet Take 1 tablet by mouth daily.    . Multiple Vitamins-Minerals (PRESERVISION AREDS 2) CAPS Take 2 capsules by mouth daily.    . pantoprazole (PROTONIX) 20 MG tablet TAKE 1 TABLET(20 MG) BY MOUTH DAILY 90 tablet 1  . prochlorperazine (COMPAZINE) 10 MG tablet Take 1 tablet (10 mg total) by mouth every 6 (six) hours as needed for nausea or vomiting. 30 tablet 0  . QUEtiapine (SEROQUEL) 25 MG tablet Take 1 tablet (25 mg total) by mouth at bedtime. 1/2- 1 pill po qhs 30 tablet 1  . simvastatin (ZOCOR) 20 MG tablet TAKE 1 TABLET(20 MG) BY MOUTH DAILY 90 tablet 2  . tamsulosin (FLOMAX) 0.4 MG CAPS capsule TAKE 1 CAPSULE(0.4 MG) BY MOUTH DAILY 90 capsule 0  . Glucosamine-Chondroit-Vit C-Mn (GLUCOSAMINE 1500 COMPLEX) CAPS Take 2 capsules by mouth daily.     No current facility-administered medications  for this encounter.     REVIEW OF SYSTEMS:  On review of systems, the patient reports that he is doing well overall. He denies any chest pain, fevers, chills, night sweats. He did notice weight loss but it has stabilized now. His appetite is fair. He reports shortness of breath with exertion and an occasional dry cough. He denies hemoptysis. He denies any bowel or bladder disturbances, and denies abdominal pain, nausea or vomiting. He did experience slight nausea with first cycle chemotherapy. He denies any new musculoskeletal or joint aches or pains. He does report an  occasional slight 'twinge' in the LLQ on occasion ongoing for more than a year and unchanged recently.  A complete review of systems is obtained and is otherwise negative.    PHYSICAL EXAM:  Wt Readings from Last 3 Encounters:  12/24/16 159 lb 3.2 oz (72.2 kg)  12/15/16 159 lb 6.4 oz (72.3 kg)  12/04/16 159 lb (72.1 kg)   Temp Readings from Last 3 Encounters:  12/24/16 98 F (36.7 C) (Oral)  12/15/16 97.9 F (36.6 C) (Oral)  12/11/16 98 F (36.7 C)   BP Readings from Last  3 Encounters:  12/24/16 116/67  12/15/16 126/65  12/11/16 117/67   Pulse Readings from Last 3 Encounters:  12/24/16 76  12/15/16 72  12/11/16 76   Pain Assessment Pain Score: 0-No pain/10  In general this is a well appearing caucasian male in no acute distress. He is alert and oriented x4 and appropriate throughout the examination. HEENT reveals that the patient is normocephalic, atraumatic. EOMs are intact. PERRLA. Skin is intact without any evidence of gross lesions. Cardiovascular exam reveals a regular rate and rhythm, no clicks rubs or murmurs are auscultated. Chest is clear to auscultation bilaterally. Lymphatic assessment is performed and does not reveal any adenopathy in the cervical, supraclavicular, axillary, or inguinal chains. Abdomen has active bowel sounds in all quadrants and is intact. The abdomen is soft, non tender, non distended. Lower extremities are negative for pretibial pitting edema, deep calf tenderness, cyanosis or clubbing.   KPS = 100  100 - Normal; no complaints; no evidence of disease. 90   - Able to carry on normal activity; minor signs or symptoms of disease. 80   - Normal activity with effort; some signs or symptoms of disease. 75   - Cares for self; unable to carry on normal activity or to do active work. 60   - Requires occasional assistance, but is able to care for most of his personal needs. 50   - Requires considerable assistance and frequent medical care. 29   - Disabled; requires special care and assistance. 76   - Severely disabled; hospital admission is indicated although death not imminent. 56   - Very sick; hospital admission necessary; active supportive treatment necessary. 10   - Moribund; fatal processes progressing rapidly. 0     - Dead  Karnofsky DA, Abelmann [place], Craver LS and Burchenal [facility] [phone]) The use of the nitrogen mustards in the palliative treatment of carcinoma: with particular reference to bronchogenic carcinoma Cancer 1  [id number]  LABORATORY DATA:  Lab Results  Component Value Date   WBC 4.7 12/15/2016   HGB 10.7 (L) 12/15/2016   HCT 32.7 (L) 12/15/2016   MCV 81.4 12/15/2016   PLT 129 (L) 12/15/2016   Lab Results  Component Value Date   NA 139 12/15/2016   K 4.2 12/15/2016   CL 105 12/04/2016   CO2 29 12/15/2016   Lab Results  Component Value Date   ALT 15 12/15/2016   AST 17 12/15/2016   ALKPHOS 95 12/15/2016   BILITOT 0.46 12/15/2016     RADIOGRAPHY: Mr Jeri Cos UX Contrast  Result Date: 12/05/2016 CLINICAL DATA:  82 y/o  M; small cell lung cancer, initial staging. EXAM: MRI HEAD WITHOUT AND WITH CONTRAST TECHNIQUE: Multiplanar, multiecho pulse sequences of the brain and surrounding structures were obtained without and with intravenous contrast. CONTRAST:  50m MULTIHANCE GADOBENATE DIMEGLUMINE 529 MG/ML IV SOLN COMPARISON:  None. FINDINGS: Brain: Partially empty sella turcica. Few scattered foci of T2 FLAIR hyperintense signal abnormality in subcortical and periventricular white matter and in the pons are compatible with mild chronic microvascular ischemic changes for age. Mild brain parenchymal volume loss for age. No abnormal susceptibility hypointensity to suggest intracranial hemorrhage. No focal mass effect. No extra-axial collection. No hydrocephalus. No abnormal enhancement of the brain. Vascular: Normal flow voids. Skull and upper cervical spine: Normal marrow signal. Sinuses/Orbits: Negative. Other: None. IMPRESSION: 1. No evidence of acute intracranial abnormality or intracranial metastasis. 2. Mild chronic microvascular ischemic changes and parenchymal volume loss of the brain for age. Electronically Signed   By: LKristine GarbeM.D.   On: 12/05/2016 17:43   Nm Pet Image Initial (pi) Skull Base To Thigh  Result Date: 12/03/2016 CLINICAL DATA:  Initial treatment strategy for small-cell lung cancer. EXAM: NUCLEAR MEDICINE PET SKULL BASE TO THIGH TECHNIQUE: 7.9 mCi F-18 FDG was  injected intravenously. Full-ring PET imaging was performed from the skull base to thigh after the radiotracer. CT data was obtained and used for attenuation correction and anatomic localization. FASTING BLOOD GLUCOSE:  Value: 111 mg/dl COMPARISON:  Chest CT 10/23/2016. FINDINGS: NECK No hypermetabolic lymph nodes in the neck. CHEST Left infrahilar lesion is markedly hypermetabolic with SUV max = 22. Low level FDG uptake is identified in subcarinal lymph nodes that are not enlarged by CT criteria. Left lower lobe peripheral airspace opacity and effusion show low level FDG uptake. ABDOMEN/PELVIS No abnormal hypermetabolic activity within the liver, pancreas, adrenal glands, or spleen. No hypermetabolic lymph nodes in the abdomen or pelvis. Multiple cysts are seen in the liver. Numerous bilateral nonobstructing renal calculi are evident. There is abdominal aortic atherosclerosis without aneurysm. Diverticular changes noted diffusely in the colon. The prostate gland is enlarged. Scattered areas of FDG uptake are identified in the terminal ileum and segments of the colon, likely physiologic. SKELETON Small foci of uptake are identified in the T12 and L1 vertebral bodies without underlying cortical lesion on CT imaging. There is also increased activity in the L3 and L4 spinous processes that show areas of heterogeneous mineralization and expansion on CT images. IMPRESSION: 1. Markedly hypermetabolic left infrahilar mass, consistent with known neoplasm. Adjacent airspace disease shows FDG uptake in may be postobstructive inflammatory change but tumor spread is not entirely excluded. 2. Low level uptake identified and nonenlarged subcarinal lymph nodes. Metastatic disease is a possibility. 3. Small foci of uptake in the T12 and L1 vertebral bodies without associated bony abnormality on CT imaging. There is also uptake in the L3 and L4 spinous processes did show evidence of heterogeneous mineralization and expansion. Given  the consecutive involvement of the spinous processes, this is probably degenerative. Bone scan or MRI of the lumbar spine without with contrast could be used to further evaluate, as clinically warranted. Electronically Signed   By: EMisty StanleyM.D.   On: 12/03/2016 18:12   Ir UKoreaGuide Vasc Access Right  Result Date: 12/04/2016 CLINICAL  DATA:  Left small cell lung cancer, access for chemotherapy EXAM: RIGHT INTERNAL JUGULAR SINGLE LUMEN POWER PORT CATHETER INSERTION Date:  2/23/20182/23/2018 10:59 am Radiologist:  M. Daryll Brod, MD Guidance:  Ultrasound and fluoroscopic MEDICATIONS: Ancef 2 g; The antibiotic was administered within an appropriate time interval prior to skin puncture. ANESTHESIA/SEDATION: Versed 1 mg IV; Fentanyl 75 mcg IV; Moderate Sedation Time:  27 minutes The patient was continuously monitored during the procedure by the interventional radiology nurse under my direct supervision. FLUOROSCOPY TIME:  1 minutes, 6 seconds (3 mGy) COMPLICATIONS: None immediate. CONTRAST:  None. PROCEDURE: Informed consent was obtained from the patient following explanation of the procedure, risks, benefits and alternatives. The patient understands, agrees and consents for the procedure. All questions were addressed. A time out was performed. Maximal barrier sterile technique utilized including caps, mask, sterile gowns, sterile gloves, large sterile drape, hand hygiene, and 2% chlorhexidine scrub. Under sterile conditions and local anesthesia, right internal jugular micropuncture venous access was performed. Access was performed with ultrasound. Images were obtained for documentation. A guide wire was inserted followed by a transitional dilator. This allowed insertion of a guide wire and catheter into the IVC. Measurements were obtained from the SVC / RA junction back to the right IJ venotomy site. In the right infraclavicular chest, a subcutaneous pocket was created over the second anterior rib. This was done  under sterile conditions and local anesthesia. 1% lidocaine with epinephrine was utilized for this. A 2.5 cm incision was made in the skin. Blunt dissection was performed to create a subcutaneous pocket over the right pectoralis major muscle. The pocket was flushed with saline vigorously. There was adequate hemostasis. The port catheter was assembled and checked for leakage. The port catheter was secured in the pocket with two retention sutures. The tubing was tunneled subcutaneously to the right venotomy site and inserted into the SVC/RA junction through a valved peel-away sheath. Position was confirmed with fluoroscopy. Images were obtained for documentation. The patient tolerated the procedure well. No immediate complications. Incisions were closed in a two layer fashion with 4 - 0 Vicryl suture. Dermabond was applied to the skin. The port catheter was accessed, blood was aspirated followed by saline and heparin flushes. Needle was removed. A dry sterile dressing was applied. IMPRESSION: Ultrasound and fluoroscopically guided right internal jugular single lumen power port catheter insertion. Tip in the SVC/RA junction. Catheter ready for use. Electronically Signed   By: Jerilynn Mages.  Shick M.D.   On: 12/04/2016 11:03   Ir Fluoro Guide Port Insertion Right  Result Date: 12/04/2016 CLINICAL DATA:  Left small cell lung cancer, access for chemotherapy EXAM: RIGHT INTERNAL JUGULAR SINGLE LUMEN POWER PORT CATHETER INSERTION Date:  2/23/20182/23/2018 10:59 am Radiologist:  M. Daryll Brod, MD Guidance:  Ultrasound and fluoroscopic MEDICATIONS: Ancef 2 g; The antibiotic was administered within an appropriate time interval prior to skin puncture. ANESTHESIA/SEDATION: Versed 1 mg IV; Fentanyl 75 mcg IV; Moderate Sedation Time:  27 minutes The patient was continuously monitored during the procedure by the interventional radiology nurse under my direct supervision. FLUOROSCOPY TIME:  1 minutes, 6 seconds (3 mGy) COMPLICATIONS:  None immediate. CONTRAST:  None. PROCEDURE: Informed consent was obtained from the patient following explanation of the procedure, risks, benefits and alternatives. The patient understands, agrees and consents for the procedure. All questions were addressed. A time out was performed. Maximal barrier sterile technique utilized including caps, mask, sterile gowns, sterile gloves, large sterile drape, hand hygiene, and 2% chlorhexidine scrub. Under sterile conditions  and local anesthesia, right internal jugular micropuncture venous access was performed. Access was performed with ultrasound. Images were obtained for documentation. A guide wire was inserted followed by a transitional dilator. This allowed insertion of a guide wire and catheter into the IVC. Measurements were obtained from the SVC / RA junction back to the right IJ venotomy site. In the right infraclavicular chest, a subcutaneous pocket was created over the second anterior rib. This was done under sterile conditions and local anesthesia. 1% lidocaine with epinephrine was utilized for this. A 2.5 cm incision was made in the skin. Blunt dissection was performed to create a subcutaneous pocket over the right pectoralis major muscle. The pocket was flushed with saline vigorously. There was adequate hemostasis. The port catheter was assembled and checked for leakage. The port catheter was secured in the pocket with two retention sutures. The tubing was tunneled subcutaneously to the right venotomy site and inserted into the SVC/RA junction through a valved peel-away sheath. Position was confirmed with fluoroscopy. Images were obtained for documentation. The patient tolerated the procedure well. No immediate complications. Incisions were closed in a two layer fashion with 4 - 0 Vicryl suture. Dermabond was applied to the skin. The port catheter was accessed, blood was aspirated followed by saline and heparin flushes. Needle was removed. A dry sterile dressing  was applied. IMPRESSION: Ultrasound and fluoroscopically guided right internal jugular single lumen power port catheter insertion. Tip in the SVC/RA junction. Catheter ready for use. Electronically Signed   By: Jerilynn Mages.  Shick M.D.   On: 12/04/2016 11:03      IMPRESSION/PLAN: 1. 82 y.o. with newly diagnosed limited stage Small Cell Lung cancer of the left lung. Today, we talked to the patient and his son about the findings and work-up thus far.  We discussed the natural history of limited stage small cell lung cancer and general treatment, highlighting the role of concurrent radiotherapy in the management.  We discussed the available radiation techniques, and focused on the details of logistics and delivery.  Dr. Tammi Klippel recommends a 6 week course of IMRT.  We reviewed the anticipated acute and late sequelae associated with radiation in this setting.  The patient was encouraged to ask questions that we answered to the best of our ability. The patient would like to proceed with concurrent radiation and has been scheduled for CT simulation on March 21st at 11 am.  2. Prophylactic Cranial Irradiation. We also briefly discussed the role of prophylactic brain radiotherapy in the management of his disease. We discussed the natural history of limited stage small cell lung cancer and potential for brain metastasis, highlighting the role of preventative radiotherapy in the management. This would not be recommended until he has completed systemic chemotherapy. We will discuss this further at his follow up visit once he has completed chemoradiation.  We spent a total of 60 minutes face to face with the patient and his son and greater than 50% of that time was spent in counseling and/or coordination of care.   Nicholos Johns, PA-C    Tyler Pita, MD  [facility] Direct Dial: [phone]  Fax: [phone] [email or web address]  Skype  [email or web address]  This document serves as a record of services  personally performed by Tyler Pita, MD and Freeman Caldron, PA-C. It was created on their behalf by Arlyce Harman, a trained medical scribe. The creation of this record is based on the scribe's personal observations and the provider's statements to them. This document has been  checked and approved by the attending provider.

## 2016-12-24 ENCOUNTER — Ambulatory Visit
Admission: RE | Admit: 2016-12-24 | Discharge: 2016-12-24 | Disposition: A | Discharge status: Home / Self Care | Payer: Medicare Other | Source: Ambulatory Visit | Attending: Radiation Oncology | Admitting: Radiation Oncology

## 2016-12-24 ENCOUNTER — Encounter: Payer: Self-pay | Admitting: Radiation Oncology

## 2016-12-24 VITALS — BP 116/67 | HR 76 | Temp 98.0°F | Resp 16 | Ht 70.0 in | Wt 159.2 lb

## 2016-12-24 DIAGNOSIS — G4730 Sleep apnea, unspecified: Secondary | ICD-10-CM | POA: Diagnosis not present

## 2016-12-24 DIAGNOSIS — M1990 Unspecified osteoarthritis, unspecified site: Secondary | ICD-10-CM | POA: Insufficient documentation

## 2016-12-24 DIAGNOSIS — C3492 Malignant neoplasm of unspecified part of left bronchus or lung: Secondary | ICD-10-CM | POA: Insufficient documentation

## 2016-12-24 DIAGNOSIS — Z7982 Long term (current) use of aspirin: Secondary | ICD-10-CM | POA: Diagnosis not present

## 2016-12-24 DIAGNOSIS — E785 Hyperlipidemia, unspecified: Secondary | ICD-10-CM | POA: Insufficient documentation

## 2016-12-24 DIAGNOSIS — Z85828 Personal history of other malignant neoplasm of skin: Secondary | ICD-10-CM | POA: Insufficient documentation

## 2016-12-24 DIAGNOSIS — Z79899 Other long term (current) drug therapy: Secondary | ICD-10-CM | POA: Insufficient documentation

## 2016-12-24 DIAGNOSIS — Z510 Encounter for antineoplastic radiation therapy: Secondary | ICD-10-CM | POA: Insufficient documentation

## 2016-12-24 DIAGNOSIS — Z888 Allergy status to other drugs, medicaments and biological substances status: Secondary | ICD-10-CM | POA: Diagnosis not present

## 2016-12-24 DIAGNOSIS — K219 Gastro-esophageal reflux disease without esophagitis: Secondary | ICD-10-CM | POA: Diagnosis not present

## 2016-12-24 DIAGNOSIS — Z87891 Personal history of nicotine dependence: Secondary | ICD-10-CM | POA: Diagnosis not present

## 2016-12-24 DIAGNOSIS — F329 Major depressive disorder, single episode, unspecified: Secondary | ICD-10-CM | POA: Insufficient documentation

## 2016-12-24 DIAGNOSIS — I2510 Atherosclerotic heart disease of native coronary artery without angina pectoris: Secondary | ICD-10-CM | POA: Insufficient documentation

## 2016-12-24 DIAGNOSIS — Z8249 Family history of ischemic heart disease and other diseases of the circulatory system: Secondary | ICD-10-CM | POA: Diagnosis not present

## 2016-12-24 DIAGNOSIS — E559 Vitamin D deficiency, unspecified: Secondary | ICD-10-CM | POA: Diagnosis not present

## 2016-12-24 NOTE — Addendum Note (Signed)
Encounter addended by: Heywood Footman, RN on: 12/24/2016  3:59 PM<BR>    Actions taken: Charge Capture section accepted

## 2016-12-24 NOTE — Addendum Note (Signed)
Encounter addended by: Malena Edman, RN on: 12/24/2016 12:55 PM<BR>    Actions taken: Charge Capture section accepted

## 2016-12-25 ENCOUNTER — Encounter: Payer: Self-pay | Admitting: Pharmacist

## 2016-12-28 ENCOUNTER — Ambulatory Visit (HOSPITAL_BASED_OUTPATIENT_CLINIC_OR_DEPARTMENT_OTHER): Payer: Medicare Other | Admitting: Internal Medicine

## 2016-12-28 ENCOUNTER — Ambulatory Visit (HOSPITAL_BASED_OUTPATIENT_CLINIC_OR_DEPARTMENT_OTHER): Payer: Medicare Other

## 2016-12-28 ENCOUNTER — Other Ambulatory Visit: Payer: Self-pay

## 2016-12-28 ENCOUNTER — Telehealth: Payer: Self-pay | Admitting: Internal Medicine

## 2016-12-28 ENCOUNTER — Other Ambulatory Visit (HOSPITAL_BASED_OUTPATIENT_CLINIC_OR_DEPARTMENT_OTHER): Payer: Medicare Other

## 2016-12-28 ENCOUNTER — Ambulatory Visit: Payer: Medicare Other

## 2016-12-28 ENCOUNTER — Encounter: Payer: Self-pay | Admitting: Internal Medicine

## 2016-12-28 VITALS — BP 123/67 | HR 60 | Temp 98.0°F | Resp 18 | Ht 70.0 in | Wt 161.9 lb

## 2016-12-28 DIAGNOSIS — C3492 Malignant neoplasm of unspecified part of left bronchus or lung: Secondary | ICD-10-CM

## 2016-12-28 DIAGNOSIS — C3432 Malignant neoplasm of lower lobe, left bronchus or lung: Secondary | ICD-10-CM | POA: Diagnosis present

## 2016-12-28 DIAGNOSIS — Z5111 Encounter for antineoplastic chemotherapy: Secondary | ICD-10-CM

## 2016-12-28 DIAGNOSIS — Z95828 Presence of other vascular implants and grafts: Secondary | ICD-10-CM

## 2016-12-28 LAB — COMPREHENSIVE METABOLIC PANEL
ALT: 17 U/L (ref 0–55)
AST: 17 U/L (ref 5–34)
Albumin: 3.2 g/dL — ABNORMAL LOW (ref 3.5–5.0)
Alkaline Phosphatase: 98 U/L (ref 40–150)
Anion Gap: 7 mEq/L (ref 3–11)
BILIRUBIN TOTAL: 0.3 mg/dL (ref 0.20–1.20)
BUN: 13.7 mg/dL (ref 7.0–26.0)
CO2: 26 meq/L (ref 22–29)
Calcium: 9.3 mg/dL (ref 8.4–10.4)
Chloride: 106 mEq/L (ref 98–109)
Creatinine: 1.1 mg/dL (ref 0.7–1.3)
EGFR: 63 mL/min/{1.73_m2} — AB (ref >90)
GLUCOSE: 112 mg/dL (ref 70–140)
Potassium: 4.3 mEq/L (ref 3.5–5.1)
SODIUM: 140 meq/L (ref 136–145)
TOTAL PROTEIN: 6.9 g/dL (ref 6.4–8.3)

## 2016-12-28 LAB — CBC WITH DIFFERENTIAL/PLATELET
BASO%: 0.2 % (ref 0.0–2.0)
Basophils Absolute: 0 10*3/uL (ref 0.0–0.1)
EOS%: 0.8 % (ref 0.0–7.0)
Eosinophils Absolute: 0.1 10*3/uL (ref 0.0–0.5)
HCT: 34.3 % — ABNORMAL LOW (ref 38.4–49.9)
HGB: 10.7 g/dL — ABNORMAL LOW (ref 13.0–17.1)
LYMPH%: 27.9 % (ref 14.0–49.0)
MCH: 26.6 pg — ABNORMAL LOW (ref 27.2–33.4)
MCHC: 31.2 g/dL — ABNORMAL LOW (ref 32.0–36.0)
MCV: 85.1 fL (ref 79.3–98.0)
MONO#: 0.7 10*3/uL (ref 0.1–0.9)
MONO%: 6.3 % (ref 0.0–14.0)
NEUT%: 64.8 % (ref 39.0–75.0)
NEUTROS ABS: 7.3 10*3/uL — AB (ref 1.5–6.5)
PLATELETS: 234 10*3/uL (ref 140–400)
RBC: 4.03 10*6/uL — AB (ref 4.20–5.82)
RDW: 16.4 % — ABNORMAL HIGH (ref 11.0–14.6)
WBC: 11.3 10*3/uL — ABNORMAL HIGH (ref 4.0–10.3)
lymph#: 3.1 10*3/uL (ref 0.9–3.3)

## 2016-12-28 MED ORDER — SODIUM CHLORIDE 0.9 % IV SOLN
Freq: Once | INTRAVENOUS | Status: AC
  Administered 2016-12-28: 10:00:00 via INTRAVENOUS

## 2016-12-28 MED ORDER — PALONOSETRON HCL INJECTION 0.25 MG/5ML
INTRAVENOUS | Status: AC
  Filled 2016-12-28: qty 5

## 2016-12-28 MED ORDER — SODIUM CHLORIDE 0.9 % IV SOLN
402.5000 mg | Freq: Once | INTRAVENOUS | Status: AC
  Administered 2016-12-28: 400 mg via INTRAVENOUS
  Filled 2016-12-28: qty 40

## 2016-12-28 MED ORDER — SODIUM CHLORIDE 0.9 % IV SOLN
100.0000 mg/m2 | Freq: Once | INTRAVENOUS | Status: AC
  Administered 2016-12-28: 190 mg via INTRAVENOUS
  Filled 2016-12-28: qty 9.5

## 2016-12-28 MED ORDER — SODIUM CHLORIDE 0.9% FLUSH
10.0000 mL | INTRAVENOUS | Status: DC | PRN
  Administered 2016-12-28: 10 mL via INTRAVENOUS
  Filled 2016-12-28: qty 10

## 2016-12-28 MED ORDER — DEXAMETHASONE SODIUM PHOSPHATE 10 MG/ML IJ SOLN
10.0000 mg | Freq: Once | INTRAMUSCULAR | Status: AC
  Administered 2016-12-28: 10 mg via INTRAVENOUS

## 2016-12-28 MED ORDER — SODIUM CHLORIDE 0.9% FLUSH
10.0000 mL | INTRAVENOUS | Status: DC | PRN
  Administered 2016-12-28: 10 mL
  Filled 2016-12-28: qty 10

## 2016-12-28 MED ORDER — PALONOSETRON HCL INJECTION 0.25 MG/5ML
0.2500 mg | Freq: Once | INTRAVENOUS | Status: AC
  Administered 2016-12-28: 0.25 mg via INTRAVENOUS

## 2016-12-28 MED ORDER — HEPARIN SOD (PORK) LOCK FLUSH 100 UNIT/ML IV SOLN
500.0000 [IU] | Freq: Once | INTRAVENOUS | Status: AC | PRN
  Administered 2016-12-28: 500 [IU]
  Filled 2016-12-28: qty 5

## 2016-12-28 MED ORDER — DEXAMETHASONE SODIUM PHOSPHATE 10 MG/ML IJ SOLN
INTRAMUSCULAR | Status: AC
  Filled 2016-12-28: qty 1

## 2016-12-28 NOTE — Patient Instructions (Signed)
Implanted Port Home Guide An implanted port is a type of central line that is placed under the skin. Central lines are used to provide IV access when treatment or nutrition needs to be given through a person's veins. Implanted ports are used for long-term IV access. An implanted port may be placed because:  You need IV medicine that would be irritating to the small veins in your hands or arms.  You need long-term IV medicines, such as antibiotics.  You need IV nutrition for a long period.  You need frequent blood draws for lab tests.  You need dialysis.  Implanted ports are usually placed in the chest area, but they can also be placed in the upper arm, the abdomen, or the leg. An implanted port has two main parts:  Reservoir. The reservoir is round and will appear as a small, raised area under your skin. The reservoir is the part where a needle is inserted to give medicines or draw blood.  Catheter. The catheter is a thin, flexible tube that extends from the reservoir. The catheter is placed into a large vein. Medicine that is inserted into the reservoir goes into the catheter and then into the vein.  How will I care for my incision site? Do not get the incision site wet. Bathe or shower as directed by your health care provider. How is my port accessed? Special steps must be taken to access the port:  Before the port is accessed, a numbing cream can be placed on the skin. This helps numb the skin over the port site.  Your health care provider uses a sterile technique to access the port. ? Your health care provider must put on a mask and sterile gloves. ? The skin over your port is cleaned carefully with an antiseptic and allowed to dry. ? The port is gently pinched between sterile gloves, and a needle is inserted into the port.  Only "non-coring" port needles should be used to access the port. Once the port is accessed, a blood return should be checked. This helps ensure that the port  is in the vein and is not clogged.  If your port needs to remain accessed for a constant infusion, a clear (transparent) bandage will be placed over the needle site. The bandage and needle will need to be changed every week, or as directed by your health care provider.  Keep the bandage covering the needle clean and dry. Do not get it wet. Follow your health care provider's instructions on how to take a shower or bath while the port is accessed.  If your port does not need to stay accessed, no bandage is needed over the port.  What is flushing? Flushing helps keep the port from getting clogged. Follow your health care provider's instructions on how and when to flush the port. Ports are usually flushed with saline solution or a medicine called heparin. The need for flushing will depend on how the port is used.  If the port is used for intermittent medicines or blood draws, the port will need to be flushed: ? After medicines have been given. ? After blood has been drawn. ? As part of routine maintenance.  If a constant infusion is running, the port may not need to be flushed.  How long will my port stay implanted? The port can stay in for as long as your health care provider thinks it is needed. When it is time for the port to come out, surgery will be   done to remove it. The procedure is similar to the one performed when the port was put in. When should I seek immediate medical care? When you have an implanted port, you should seek immediate medical care if:  You notice a bad smell coming from the incision site.  You have swelling, redness, or drainage at the incision site.  You have more swelling or pain at the port site or the surrounding area.  You have a fever that is not controlled with medicine.  This information is not intended to replace advice given to you by your health care provider. Make sure you discuss any questions you have with your health care provider. Document  Released: 09/28/2005 Document Revised: 03/05/2016 Document Reviewed: 06/05/2013 [employer] Interactive Patient Education  2017 [employer]  

## 2016-12-28 NOTE — Telephone Encounter (Signed)
Gave patient dtr avs report and appointments for March thru May. Per dtr request left message for social worker re contacting patient son re living will and transportation.

## 2016-12-28 NOTE — Progress Notes (Signed)
  Radiation Oncology         [phone]) [phone] ________________________________  Name: Christian Munoz MRN: [id number]  Date: 12/30/2016  DOB: [birth year]  SIMULATION AND TREATMENT PLANNING NOTE    ICD-9-CM ICD-10-CM   1. Small cell lung cancer, left (HCC) 162.9 C34.92     DIAGNOSIS: 82 year-old gentleman with limited stage Small Cell Lung cancer in the left lower lung.  NARRATIVE:  The patient was brought to the [facility].  Identity was confirmed.  All relevant records and images related to the planned course of therapy were reviewed.  The patient freely provided informed written consent to proceed with treatment after reviewing the details related to the planned course of therapy. The consent form was witnessed and verified by the simulation staff.  Then, the patient was set-up in a stable reproducible  supine position for radiation therapy.  CT images were obtained.  Surface markings were placed.  The CT images were loaded into the planning software.  Then the target and avoidance structures were contoured.  Treatment planning then occurred.  The radiation prescription was entered and confirmed.  Then, I designed and supervised the construction of a total of 6 medically necessary complex treatment devices, including a BodyFix immobilization mold custom fitted to the patient along with 5 multileaf collimators conformally shaped radiation around the treatment target while shielding critical structures such as the heart and spinal cord maximally.  I have requested : 3D Simulation  I have requested a DVH of the following structures: Left lung, right lung, spinal cord, heart, esophagus, and target.  I have ordered:Nutrition Consult  SPECIAL TREATMENT PROCEDURE:  The planned course of therapy using radiation constitutes a special treatment procedure. Special care is required in the management of this patient for the following reasons.  The patient will be receiving concurrent  chemotherapy requiring careful monitoring for increased toxicities of treatment including periodic laboratory values.  The special nature of the planned course of radiotherapy will require increased physician supervision and oversight to ensure patient's safety with optimal treatment outcomes.  PLAN:  The patient will receive 59.4 Gy in 33 fractions.  ------------------------------------------------   Sheral Apley. Tammi Klippel, M.D.

## 2016-12-28 NOTE — Progress Notes (Signed)
[facility] [phone]   [phone]  OFFICE PROGRESS NOTE  [name], NP [place] [address]  DIAGNOSIS: Stage IIIA (T3, N2, M0) small cell lung cancer diagnosed in February 2018 and presented with left lower lobe lung mass in addition to subcarinal lymphadenopathy.  PRIOR THERAPY: None  CURRENT THERAPY: Systemic chemotherapy with carboplatin for AUC of 5 on day 1 and etoposide 100 MG/M2 on days 1, 2 and 3 with Neulasta support. This will be concurrent with radiotherapy. Status post one cycle.  INTERVAL HISTORY: Christian Munoz 82 y.o. male returns to the clinic today for follow-up visit accompanied by his daughter, [name]. The patient is feeling fine today with no specific complaints. He tolerated the first cycle of his treatment well with no significant adverse effects. He denied having any nausea or vomiting. He has no fever or chills. He denied having any chest pain, shortness of breath, cough or hemoptysis. He has occasional cramps of his legs. The patient is here today for evaluation before starting cycle #2.  MEDICAL HISTORY: Past Medical History:  Diagnosis Date  . Allergic rhinitis due to pollen   . Anginal pain ([place])    Past medical history " does not seem to be a problem at all now."  . Coronary atherosclerosis of native coronary artery   . Coronary atherosclerosis of native coronary artery   . Depressive disorder, not elsewhere classified   . Encounter for antineoplastic chemotherapy 11/30/2016  . GERD (gastroesophageal reflux disease)   . Goals of care, counseling/discussion 11/30/2016  . Headache    PMH: Migraines  . Herpes genitalia   . Hypertrophy of prostate without urinary obstruction and other lower urinary tract symptoms (LUTS)   . Hypertrophy of prostate without urinary obstruction and other lower urinary tract symptoms (LUTS)   . Lack of coordination   . Lack of coordination   . Lumbago     . Nausea and vomiting 11/30/2016  . Osteoarthrosis, unspecified whether generalized or localized, unspecified site   . Osteoarthrosis, unspecified whether generalized or localized, unspecified site   . Other and unspecified hyperlipidemia   . Other malaise and fatigue   . Pneumonia   . Reflux esophagitis   . Skin cancer   . Sleep apnea    does not wear CPAP  . Syncope and collapse   . Urinary frequency   . Vitamin D deficiency     ALLERGIES:  is allergic to chantix [varenicline] and zoloft [sertraline hcl].  MEDICATIONS:  Current Outpatient Prescriptions  Medication Sig Dispense Refill  . aspirin 81 MG tablet Take 81 mg by mouth daily. Take one tablet once a day    . escitalopram (LEXAPRO) 5 MG tablet TAKE 1 TABLET(5 MG) BY MOUTH EVERY MORNING 30 tablet 0  . fish oil-omega-3 fatty acids 1000 MG capsule Take 2 g by mouth daily. Take one tablet twice a day    . Glucosamine-Chondroit-Vit C-Mn (GLUCOSAMINE 1500 COMPLEX) CAPS Take 2 capsules by mouth daily.    Marland Kitchen lidocaine-prilocaine (EMLA) cream Apply 1 application topically as needed. 30 g 0  . metoprolol tartrate (LOPRESSOR) 25 MG tablet TAKE 1 TABLET BY MOUTH TWICE DAILY 180 tablet 1  . Multiple Vitamins-Minerals (MULTIVITAMIN WITH MINERALS) tablet Take 1 tablet by mouth daily.    . Multiple Vitamins-Minerals (PRESERVISION AREDS 2) CAPS Take 2 capsules by mouth daily.    . pantoprazole (PROTONIX) 20 MG tablet TAKE 1 TABLET(20 MG) BY MOUTH DAILY  90 tablet 1  . prochlorperazine (COMPAZINE) 10 MG tablet Take 1 tablet (10 mg total) by mouth every 6 (six) hours as needed for nausea or vomiting. 30 tablet 0  . QUEtiapine (SEROQUEL) 25 MG tablet Take 1 tablet (25 mg total) by mouth at bedtime. 1/2- 1 pill po qhs 30 tablet 1  . simvastatin (ZOCOR) 20 MG tablet TAKE 1 TABLET(20 MG) BY MOUTH DAILY 90 tablet 2  . tamsulosin (FLOMAX) 0.4 MG CAPS capsule TAKE 1 CAPSULE(0.4 MG) BY MOUTH DAILY 90 capsule 0   No current facility-administered  medications for this visit.     SURGICAL HISTORY:  Past Surgical History:  Procedure Laterality Date  . CARDIAC CATHETERIZATION    . COLONOSCOPY    . EYE SURGERY    . IR GENERIC HISTORICAL  12/04/2016   IR US GUIDE VASC ACCESS RIGHT 12/04/2016 Greggory Keen, MD [facility]  . IR GENERIC HISTORICAL  12/04/2016   IR FLUORO GUIDE PORT INSERTION RIGHT 12/04/2016 Greggory Keen, MD [facility]  . poly vocal cords  1985  . SKIN CANCER EXCISION  2011  . TONSILLECTOMY    . VASECTOMY    . VIDEO BRONCHOSCOPY WITH ENDOBRONCHIAL ULTRASOUND N/A 11/20/2016   Procedure: VIDEO BRONCHOSCOPY WITH ENDOBRONCHIAL ULTRASOUND;  Surgeon: Collene Gobble, MD;  Location: [facility];  Service: Thoracic;  Laterality: N/A;    REVIEW OF SYSTEMS:  A comprehensive review of systems was negative except for: Constitutional: positive for fatigue   PHYSICAL EXAMINATION: General appearance: alert, cooperative, fatigued and no distress Head: Normocephalic, without obvious abnormality, atraumatic Neck: no adenopathy, no JVD, supple, symmetrical, trachea midline and thyroid not enlarged, symmetric, no tenderness/mass/nodules Lymph nodes: Cervical, supraclavicular, and axillary nodes normal. Resp: clear to auscultation bilaterally Back: symmetric, no curvature. ROM normal. No CVA tenderness. Cardio: regular rate and rhythm, S1, S2 normal, no murmur, click, rub or gallop GI: soft, non-tender; bowel sounds normal; no masses,  no organomegaly Extremities: extremities normal, atraumatic, no cyanosis or edema  ECOG PERFORMANCE STATUS: 1 - Symptomatic but completely ambulatory  Blood pressure 123/67, pulse 60, temperature 98 F (36.7 C), temperature source Oral, resp. rate 18, height '5\' 10"'$  (1.778 m), weight 161 lb 14.4 oz (73.4 kg), SpO2 99 %.  LABORATORY DATA: Lab Results  Component Value Date   WBC 11.3 (H) 12/28/2016   HGB 10.7 (L) 12/28/2016   HCT 34.3 (L) 12/28/2016   MCV 85.1 12/28/2016   PLT 234 12/28/2016       Chemistry      Component Value Date/Time   NA 139 12/15/2016 1017   K 4.2 12/15/2016 1017   CL 105 12/04/2016 0814   CO2 29 12/15/2016 1017   BUN 22.0 12/15/2016 1017   CREATININE 0.9 12/15/2016 1017      Component Value Date/Time   CALCIUM 9.4 12/15/2016 1017   ALKPHOS 95 12/15/2016 1017   AST 17 12/15/2016 1017   ALT 15 12/15/2016 1017   BILITOT 0.46 12/15/2016 1017       RADIOGRAPHIC STUDIES: Mr Jeri Cos SA Contrast  Result Date: 12/05/2016 CLINICAL DATA:  82 y/o  M; small cell lung cancer, initial staging. EXAM: MRI HEAD WITHOUT AND WITH CONTRAST TECHNIQUE: Multiplanar, multiecho pulse sequences of the brain and surrounding structures were obtained without and with intravenous contrast. CONTRAST:  79m MULTIHANCE GADOBENATE DIMEGLUMINE 529 MG/ML IV SOLN COMPARISON:  None. FINDINGS: Brain: Partially empty sella turcica. Few scattered foci of T2 FLAIR hyperintense signal abnormality in subcortical and periventricular white matter and in the  pons are compatible with mild chronic microvascular ischemic changes for age. Mild brain parenchymal volume loss for age. No abnormal susceptibility hypointensity to suggest intracranial hemorrhage. No focal mass effect. No extra-axial collection. No hydrocephalus. No abnormal enhancement of the brain. Vascular: Normal flow voids. Skull and upper cervical spine: Normal marrow signal. Sinuses/Orbits: Negative. Other: None. IMPRESSION: 1. No evidence of acute intracranial abnormality or intracranial metastasis. 2. Mild chronic microvascular ischemic changes and parenchymal volume loss of the brain for age. Electronically Signed   By: Kristine Garbe M.D.   On: 12/05/2016 17:43   Nm Pet Image Initial (pi) Skull Base To Thigh  Result Date: 12/03/2016 CLINICAL DATA:  Initial treatment strategy for small-cell lung cancer. EXAM: NUCLEAR MEDICINE PET SKULL BASE TO THIGH TECHNIQUE: 7.9 mCi F-18 FDG was injected intravenously. Full-ring PET imaging was  performed from the skull base to thigh after the radiotracer. CT data was obtained and used for attenuation correction and anatomic localization. FASTING BLOOD GLUCOSE:  Value: 111 mg/dl COMPARISON:  Chest CT 10/23/2016. FINDINGS: NECK No hypermetabolic lymph nodes in the neck. CHEST Left infrahilar lesion is markedly hypermetabolic with SUV max = 22. Low level FDG uptake is identified in subcarinal lymph nodes that are not enlarged by CT criteria. Left lower lobe peripheral airspace opacity and effusion show low level FDG uptake. ABDOMEN/PELVIS No abnormal hypermetabolic activity within the liver, pancreas, adrenal glands, or spleen. No hypermetabolic lymph nodes in the abdomen or pelvis. Multiple cysts are seen in the liver. Numerous bilateral nonobstructing renal calculi are evident. There is abdominal aortic atherosclerosis without aneurysm. Diverticular changes noted diffusely in the colon. The prostate gland is enlarged. Scattered areas of FDG uptake are identified in the terminal ileum and segments of the colon, likely physiologic. SKELETON Small foci of uptake are identified in the T12 and L1 vertebral bodies without underlying cortical lesion on CT imaging. There is also increased activity in the L3 and L4 spinous processes that show areas of heterogeneous mineralization and expansion on CT images. IMPRESSION: 1. Markedly hypermetabolic left infrahilar mass, consistent with known neoplasm. Adjacent airspace disease shows FDG uptake in may be postobstructive inflammatory change but tumor spread is not entirely excluded. 2. Low level uptake identified and nonenlarged subcarinal lymph nodes. Metastatic disease is a possibility. 3. Small foci of uptake in the T12 and L1 vertebral bodies without associated bony abnormality on CT imaging. There is also uptake in the L3 and L4 spinous processes did show evidence of heterogeneous mineralization and expansion. Given the consecutive involvement of the spinous  processes, this is probably degenerative. Bone scan or MRI of the lumbar spine without with contrast could be used to further evaluate, as clinically warranted. Electronically Signed   By: Misty Stanley M.D.   On: 12/03/2016 18:12   Ir US Guide Vasc Access Right  Result Date: 12/04/2016 CLINICAL DATA:  Left small cell lung cancer, access for chemotherapy EXAM: RIGHT INTERNAL JUGULAR SINGLE LUMEN POWER PORT CATHETER INSERTION Date:  2/23/20182/23/2018 10:59 am Radiologist:  M. Daryll Brod, MD Guidance:  Ultrasound and fluoroscopic MEDICATIONS: Ancef 2 g; The antibiotic was administered within an appropriate time interval prior to skin puncture. ANESTHESIA/SEDATION: Versed 1 mg IV; Fentanyl 75 mcg IV; Moderate Sedation Time:  27 minutes The patient was continuously monitored during the procedure by the interventional radiology nurse under my direct supervision. FLUOROSCOPY TIME:  1 minutes, 6 seconds (3 mGy) COMPLICATIONS: None immediate. CONTRAST:  None. PROCEDURE: Informed consent was obtained from the patient following explanation of the  procedure, risks, benefits and alternatives. The patient understands, agrees and consents for the procedure. All questions were addressed. A time out was performed. Maximal barrier sterile technique utilized including caps, mask, sterile gowns, sterile gloves, large sterile drape, hand hygiene, and 2% chlorhexidine scrub. Under sterile conditions and local anesthesia, right internal jugular micropuncture venous access was performed. Access was performed with ultrasound. Images were obtained for documentation. A guide wire was inserted followed by a transitional dilator. This allowed insertion of a guide wire and catheter into the IVC. Measurements were obtained from the SVC / RA junction back to the right IJ venotomy site. In the right infraclavicular chest, a subcutaneous pocket was created over the second anterior rib. This was done under sterile conditions and local  anesthesia. 1% lidocaine with epinephrine was utilized for this. A 2.5 cm incision was made in the skin. Blunt dissection was performed to create a subcutaneous pocket over the right pectoralis major muscle. The pocket was flushed with saline vigorously. There was adequate hemostasis. The port catheter was assembled and checked for leakage. The port catheter was secured in the pocket with two retention sutures. The tubing was tunneled subcutaneously to the right venotomy site and inserted into the SVC/RA junction through a valved peel-away sheath. Position was confirmed with fluoroscopy. Images were obtained for documentation. The patient tolerated the procedure well. No immediate complications. Incisions were closed in a two layer fashion with 4 - 0 Vicryl suture. Dermabond was applied to the skin. The port catheter was accessed, blood was aspirated followed by saline and heparin flushes. Needle was removed. A dry sterile dressing was applied. IMPRESSION: Ultrasound and fluoroscopically guided right internal jugular single lumen power port catheter insertion. Tip in the SVC/RA junction. Catheter ready for use. Electronically Signed   By: Jerilynn Mages.  Shick M.D.   On: 12/04/2016 11:03   Ir Fluoro Guide Port Insertion Right  Result Date: 12/04/2016 CLINICAL DATA:  Left small cell lung cancer, access for chemotherapy EXAM: RIGHT INTERNAL JUGULAR SINGLE LUMEN POWER PORT CATHETER INSERTION Date:  2/23/20182/23/2018 10:59 am Radiologist:  M. Daryll Brod, MD Guidance:  Ultrasound and fluoroscopic MEDICATIONS: Ancef 2 g; The antibiotic was administered within an appropriate time interval prior to skin puncture. ANESTHESIA/SEDATION: Versed 1 mg IV; Fentanyl 75 mcg IV; Moderate Sedation Time:  27 minutes The patient was continuously monitored during the procedure by the interventional radiology nurse under my direct supervision. FLUOROSCOPY TIME:  1 minutes, 6 seconds (3 mGy) COMPLICATIONS: None immediate. CONTRAST:  None.  PROCEDURE: Informed consent was obtained from the patient following explanation of the procedure, risks, benefits and alternatives. The patient understands, agrees and consents for the procedure. All questions were addressed. A time out was performed. Maximal barrier sterile technique utilized including caps, mask, sterile gowns, sterile gloves, large sterile drape, hand hygiene, and 2% chlorhexidine scrub. Under sterile conditions and local anesthesia, right internal jugular micropuncture venous access was performed. Access was performed with ultrasound. Images were obtained for documentation. A guide wire was inserted followed by a transitional dilator. This allowed insertion of a guide wire and catheter into the IVC. Measurements were obtained from the SVC / RA junction back to the right IJ venotomy site. In the right infraclavicular chest, a subcutaneous pocket was created over the second anterior rib. This was done under sterile conditions and local anesthesia. 1% lidocaine with epinephrine was utilized for this. A 2.5 cm incision was made in the skin. Blunt dissection was performed to create a subcutaneous pocket over the right  pectoralis major muscle. The pocket was flushed with saline vigorously. There was adequate hemostasis. The port catheter was assembled and checked for leakage. The port catheter was secured in the pocket with two retention sutures. The tubing was tunneled subcutaneously to the right venotomy site and inserted into the SVC/RA junction through a valved peel-away sheath. Position was confirmed with fluoroscopy. Images were obtained for documentation. The patient tolerated the procedure well. No immediate complications. Incisions were closed in a two layer fashion with 4 - 0 Vicryl suture. Dermabond was applied to the skin. The port catheter was accessed, blood was aspirated followed by saline and heparin flushes. Needle was removed. A dry sterile dressing was applied. IMPRESSION:  Ultrasound and fluoroscopically guided right internal jugular single lumen power port catheter insertion. Tip in the SVC/RA junction. Catheter ready for use. Electronically Signed   By: Jerilynn Mages.  Shick M.D.   On: 12/04/2016 11:03    ASSESSMENT AND PLAN:  This is a very pleasant 82 years old white male with limited stage small cell lung cancer. He is currently undergoing treatment with systemic chemotherapy with carboplatin and etoposide status post 1 cycle.  The patient tolerated the first cycle of his treatment well. I recommended for him to continue his treatment with carboplatin and etoposide and he will start cycle #2 today. I will him back for follow-up visit in 3 weeks for evaluation after repeating CT scan of the chest for restaging of his disease. She was advised to call immediately if he has any concerning symptoms in the interval. The patient voices understanding of current disease status and treatment options and is in agreement with the current care plan. All questions were answered. The patient knows to call the clinic with any problems, questions or concerns. We can certainly see the patient much sooner if necessary. I spent 10 minutes counseling the patient face to face. The total time spent in the appointment was 15 minutes.  Disclaimer: This note was dictated with voice recognition software. Similar sounding words can inadvertently be transcribed and may not be corrected upon review.

## 2016-12-28 NOTE — Patient Instructions (Signed)
[facility] Discharge Instructions for Patients Receiving Chemotherapy  Today you received the following chemotherapy agents Carboplatin and Etoposide  To help prevent nausea and vomiting after your treatment, we encourage you to take your nausea medication as directed If you develop nausea and vomiting that is not controlled by your nausea medication, call the clinic.   BELOW ARE SYMPTOMS THAT SHOULD BE REPORTED IMMEDIATELY:  *FEVER GREATER THAN 100.5 F  *CHILLS WITH OR WITHOUT FEVER  NAUSEA AND VOMITING THAT IS NOT CONTROLLED WITH YOUR NAUSEA MEDICATION  *UNUSUAL SHORTNESS OF BREATH  *UNUSUAL BRUISING OR BLEEDING  TENDERNESS IN MOUTH AND THROAT WITH OR WITHOUT PRESENCE OF ULCERS  *URINARY PROBLEMS  *BOWEL PROBLEMS  UNUSUAL RASH Items with * indicate a potential emergency and should be followed up as soon as possible.  Feel free to call the clinic you have any questions or concerns. The clinic phone number is [phone].  Please show the CHEMO ALERT CARD at check-in to the Emergency Department and triage nurse.   

## 2016-12-29 ENCOUNTER — Ambulatory Visit (HOSPITAL_BASED_OUTPATIENT_CLINIC_OR_DEPARTMENT_OTHER): Payer: Medicare Other

## 2016-12-29 VITALS — BP 105/79 | HR 59 | Temp 98.1°F | Resp 18

## 2016-12-29 DIAGNOSIS — Z5111 Encounter for antineoplastic chemotherapy: Secondary | ICD-10-CM

## 2016-12-29 DIAGNOSIS — C3432 Malignant neoplasm of lower lobe, left bronchus or lung: Secondary | ICD-10-CM | POA: Diagnosis not present

## 2016-12-29 DIAGNOSIS — C3492 Malignant neoplasm of unspecified part of left bronchus or lung: Secondary | ICD-10-CM

## 2016-12-29 MED ORDER — DEXAMETHASONE SODIUM PHOSPHATE 10 MG/ML IJ SOLN
INTRAMUSCULAR | Status: AC
  Filled 2016-12-29: qty 1

## 2016-12-29 MED ORDER — SODIUM CHLORIDE 0.9 % IV SOLN
100.0000 mg/m2 | Freq: Once | INTRAVENOUS | Status: AC
  Administered 2016-12-29: 190 mg via INTRAVENOUS
  Filled 2016-12-29: qty 9.5

## 2016-12-29 MED ORDER — SODIUM CHLORIDE 0.9% FLUSH
10.0000 mL | INTRAVENOUS | Status: DC | PRN
  Administered 2016-12-29: 10 mL
  Filled 2016-12-29: qty 10

## 2016-12-29 MED ORDER — SODIUM CHLORIDE 0.9 % IV SOLN
Freq: Once | INTRAVENOUS | Status: AC
  Administered 2016-12-29: 14:00:00 via INTRAVENOUS

## 2016-12-29 MED ORDER — DEXAMETHASONE SODIUM PHOSPHATE 10 MG/ML IJ SOLN
10.0000 mg | Freq: Once | INTRAMUSCULAR | Status: AC
  Administered 2016-12-29: 10 mg via INTRAVENOUS

## 2016-12-29 MED ORDER — HEPARIN SOD (PORK) LOCK FLUSH 100 UNIT/ML IV SOLN
500.0000 [IU] | Freq: Once | INTRAVENOUS | Status: AC | PRN
  Administered 2016-12-29: 500 [IU]
  Filled 2016-12-29: qty 5

## 2016-12-29 NOTE — Patient Instructions (Signed)
  [facility] Discharge Instructions for Patients Receiving Chemotherapy  Today you received the following chemotherapy agents: Etoposide   To help prevent nausea and vomiting after your treatment, we encourage you to take your nausea medication as directed.    If you develop nausea and vomiting that is not controlled by your nausea medication, call the clinic.   BELOW ARE SYMPTOMS THAT SHOULD BE REPORTED IMMEDIATELY:  *FEVER GREATER THAN 100.5 F  *CHILLS WITH OR WITHOUT FEVER  NAUSEA AND VOMITING THAT IS NOT CONTROLLED WITH YOUR NAUSEA MEDICATION  *UNUSUAL SHORTNESS OF BREATH  *UNUSUAL BRUISING OR BLEEDING  TENDERNESS IN MOUTH AND THROAT WITH OR WITHOUT PRESENCE OF ULCERS  *URINARY PROBLEMS  *BOWEL PROBLEMS  UNUSUAL RASH Items with * indicate a potential emergency and should be followed up as soon as possible.  Feel free to call the clinic you have any questions or concerns. The clinic phone number is [phone].  Please show the CHEMO ALERT CARD at check-in to the Emergency Department and triage nurse.   

## 2016-12-30 ENCOUNTER — Ambulatory Visit
Admission: RE | Admit: 2016-12-30 | Discharge: 2016-12-30 | Disposition: A | Discharge status: Home / Self Care | Payer: Medicare Other | Source: Ambulatory Visit | Attending: Radiation Oncology | Admitting: Radiation Oncology

## 2016-12-30 ENCOUNTER — Ambulatory Visit (HOSPITAL_BASED_OUTPATIENT_CLINIC_OR_DEPARTMENT_OTHER): Payer: Medicare Other

## 2016-12-30 VITALS — BP 136/75 | HR 71 | Temp 97.8°F | Resp 18

## 2016-12-30 DIAGNOSIS — C3432 Malignant neoplasm of lower lobe, left bronchus or lung: Secondary | ICD-10-CM | POA: Diagnosis not present

## 2016-12-30 DIAGNOSIS — C3492 Malignant neoplasm of unspecified part of left bronchus or lung: Secondary | ICD-10-CM

## 2016-12-30 DIAGNOSIS — Z510 Encounter for antineoplastic radiation therapy: Secondary | ICD-10-CM | POA: Diagnosis not present

## 2016-12-30 DIAGNOSIS — Z5111 Encounter for antineoplastic chemotherapy: Secondary | ICD-10-CM

## 2016-12-30 MED ORDER — DEXAMETHASONE SODIUM PHOSPHATE 10 MG/ML IJ SOLN
INTRAMUSCULAR | Status: AC
  Filled 2016-12-30: qty 1

## 2016-12-30 MED ORDER — SODIUM CHLORIDE 0.9% FLUSH
10.0000 mL | INTRAVENOUS | Status: DC | PRN
  Administered 2016-12-30: 10 mL
  Filled 2016-12-30: qty 10

## 2016-12-30 MED ORDER — ETOPOSIDE CHEMO INJECTION 1 GM/50ML
100.0000 mg/m2 | Freq: Once | INTRAVENOUS | Status: AC
  Administered 2016-12-30: 190 mg via INTRAVENOUS
  Filled 2016-12-30: qty 9.5

## 2016-12-30 MED ORDER — SODIUM CHLORIDE 0.9 % IV SOLN
Freq: Once | INTRAVENOUS | Status: AC
  Administered 2016-12-30: 08:00:00 via INTRAVENOUS

## 2016-12-30 MED ORDER — HEPARIN SOD (PORK) LOCK FLUSH 100 UNIT/ML IV SOLN
500.0000 [IU] | Freq: Once | INTRAVENOUS | Status: AC | PRN
Start: 2016-12-30 — End: 2016-12-30
  Administered 2016-12-30: 500 [IU]
  Filled 2016-12-30: qty 5

## 2016-12-30 MED ORDER — DEXAMETHASONE SODIUM PHOSPHATE 10 MG/ML IJ SOLN
10.0000 mg | Freq: Once | INTRAMUSCULAR | Status: AC
  Administered 2016-12-30: 10 mg via INTRAVENOUS

## 2016-12-30 NOTE — Patient Instructions (Signed)
[facility] Discharge Instructions for Patients Receiving Chemotherapy  Today you received the following chemotherapy agents VP 16 To help prevent nausea and vomiting after your treatment, we encourage you to take your nausea medication as prescribed.    If you develop nausea and vomiting that is not controlled by your nausea medication, call the clinic.   BELOW ARE SYMPTOMS THAT SHOULD BE REPORTED IMMEDIATELY:  *FEVER GREATER THAN 100.5 F  *CHILLS WITH OR WITHOUT FEVER  NAUSEA AND VOMITING THAT IS NOT CONTROLLED WITH YOUR NAUSEA MEDICATION  *UNUSUAL SHORTNESS OF BREATH  *UNUSUAL BRUISING OR BLEEDING  TENDERNESS IN MOUTH AND THROAT WITH OR WITHOUT PRESENCE OF ULCERS  *URINARY PROBLEMS  *BOWEL PROBLEMS  UNUSUAL RASH Items with * indicate a potential emergency and should be followed up as soon as possible.  Feel free to call the clinic you have any questions or concerns. The clinic phone number is [phone].  Please show the [place] at check-in to the Emergency Department and triage nurse.

## 2016-12-31 ENCOUNTER — Encounter: Payer: Self-pay | Admitting: *Deleted

## 2016-12-31 NOTE — Progress Notes (Signed)
[facility] Work  Clinical Social Work received referral from medical oncology for healthcare advance directives and transportation needs.  CSW contacted patient's son, Vicente Males, by phone.  Vicente Males shared transportation is no longer an issue, but they would like to complete ADs.  Patient's son plans to call CSW before an upcoming appointment to schedule a time to meet together.  Maryjean Morn, MSW, LCSW, OSW-C Clinical Social Worker [facility] [phone]

## 2017-01-01 ENCOUNTER — Ambulatory Visit (HOSPITAL_BASED_OUTPATIENT_CLINIC_OR_DEPARTMENT_OTHER): Payer: Medicare Other

## 2017-01-01 VITALS — BP 120/68 | HR 82 | Temp 97.7°F | Resp 18

## 2017-01-01 DIAGNOSIS — Z5189 Encounter for other specified aftercare: Secondary | ICD-10-CM

## 2017-01-01 DIAGNOSIS — C3432 Malignant neoplasm of lower lobe, left bronchus or lung: Secondary | ICD-10-CM

## 2017-01-01 DIAGNOSIS — C3492 Malignant neoplasm of unspecified part of left bronchus or lung: Secondary | ICD-10-CM

## 2017-01-01 MED ORDER — PEGFILGRASTIM INJECTION 6 MG/0.6ML ~~LOC~~
6.0000 mg | PREFILLED_SYRINGE | Freq: Once | SUBCUTANEOUS | Status: AC
  Administered 2017-01-01: 6 mg via SUBCUTANEOUS
  Filled 2017-01-01: qty 0.6

## 2017-01-01 NOTE — Patient Instructions (Signed)
Pegfilgrastim injection What is this medicine? PEGFILGRASTIM (PEG fil gra stim) is a long-acting granulocyte colony-stimulating factor that stimulates the growth of neutrophils, a type of white blood cell important in the body's fight against infection. It is used to reduce the incidence of fever and infection in patients with certain types of cancer who are receiving chemotherapy that affects the bone marrow, and to increase survival after being exposed to high doses of radiation. This medicine may be used for other purposes; ask your health care provider or pharmacist if you have questions. COMMON BRAND NAME(S): Neulasta What should I tell my health care provider before I take this medicine? They need to know if you have any of these conditions: -kidney disease -latex allergy -ongoing radiation therapy -sickle cell disease -skin reactions to acrylic adhesives (On-Body Injector only) -an unusual or allergic reaction to pegfilgrastim, filgrastim, other medicines, foods, dyes, or preservatives -pregnant or trying to get pregnant -breast-feeding How should I use this medicine? This medicine is for injection under the skin. If you get this medicine at home, you will be taught how to prepare and give the pre-filled syringe or how to use the On-body Injector. Refer to the patient Instructions for Use for detailed instructions. Use exactly as directed. Tell your healthcare provider immediately if you suspect that the On-body Injector may not have performed as intended or if you suspect the use of the On-body Injector resulted in a missed or partial dose. It is important that you put your used needles and syringes in a special sharps container. Do not put them in a trash can. If you do not have a sharps container, call your pharmacist or healthcare provider to get one. Talk to your pediatrician regarding the use of this medicine in children. While this drug may be prescribed for selected conditions,  precautions do apply. Overdosage: If you think you have taken too much of this medicine contact a poison control center or emergency room at once. NOTE: This medicine is only for you. Do not share this medicine with others. What if I miss a dose? It is important not to miss your dose. Call your doctor or health care professional if you miss your dose. If you miss a dose due to an On-body Injector failure or leakage, a new dose should be administered as soon as possible using a single prefilled syringe for manual use. What may interact with this medicine? Interactions have not been studied. Give your health care provider a list of all the medicines, herbs, non-prescription drugs, or dietary supplements you use. Also tell them if you smoke, drink alcohol, or use illegal drugs. Some items may interact with your medicine. This list may not describe all possible interactions. Give your health care provider a list of all the medicines, herbs, non-prescription drugs, or dietary supplements you use. Also tell them if you smoke, drink alcohol, or use illegal drugs. Some items may interact with your medicine. What should I watch for while using this medicine? You may need blood work done while you are taking this medicine. If you are going to need a MRI, CT scan, or other procedure, tell your doctor that you are using this medicine (On-Body Injector only). What side effects may I notice from receiving this medicine? Side effects that you should report to your doctor or health care professional as soon as possible: -allergic reactions like skin rash, itching or hives, swelling of the face, lips, or tongue -dizziness -fever -pain, redness, or irritation at site   where injected -pinpoint red spots on the skin -red or dark-brown urine -shortness of breath or breathing problems -stomach or side pain, or pain at the shoulder -swelling -tiredness -trouble passing urine or change in the amount of urine Side  effects that usually do not require medical attention (report to your doctor or health care professional if they continue or are bothersome): -bone pain -muscle pain This list may not describe all possible side effects. Call your doctor for medical advice about side effects. You may report side effects to FDA at 1-800-FDA-1088. Where should I keep my medicine? Keep out of the reach of children. Store pre-filled syringes in a refrigerator between 2 and 8 degrees C (36 and 46 degrees F). Do not freeze. Keep in carton to protect from light. Throw away this medicine if it is left out of the refrigerator for more than 48 hours. Throw away any unused medicine after the expiration date. NOTE: This sheet is a summary. It may not cover all possible information. If you have questions about this medicine, talk to your doctor, pharmacist, or health care provider.  2018 [employer] (2016-09-24 12:58:03)  

## 2017-01-04 ENCOUNTER — Other Ambulatory Visit (HOSPITAL_BASED_OUTPATIENT_CLINIC_OR_DEPARTMENT_OTHER): Payer: Medicare Other

## 2017-01-04 ENCOUNTER — Telehealth: Payer: Self-pay

## 2017-01-04 DIAGNOSIS — C3492 Malignant neoplasm of unspecified part of left bronchus or lung: Secondary | ICD-10-CM

## 2017-01-04 DIAGNOSIS — Z510 Encounter for antineoplastic radiation therapy: Secondary | ICD-10-CM | POA: Diagnosis not present

## 2017-01-04 DIAGNOSIS — C3432 Malignant neoplasm of lower lobe, left bronchus or lung: Secondary | ICD-10-CM | POA: Diagnosis present

## 2017-01-04 LAB — CBC WITH DIFFERENTIAL/PLATELET
BASO%: 0.2 % (ref 0.0–2.0)
Basophils Absolute: 0 10*3/uL (ref 0.0–0.1)
EOS%: 0.5 % (ref 0.0–7.0)
Eosinophils Absolute: 0.1 10*3/uL (ref 0.0–0.5)
HCT: 33 % — ABNORMAL LOW (ref 38.4–49.9)
HGB: 10.4 g/dL — ABNORMAL LOW (ref 13.0–17.1)
LYMPH%: 20 % (ref 14.0–49.0)
MCH: 27 pg — ABNORMAL LOW (ref 27.2–33.4)
MCHC: 31.5 g/dL — AB (ref 32.0–36.0)
MCV: 85.7 fL (ref 79.3–98.0)
MONO#: 0.2 10*3/uL (ref 0.1–0.9)
MONO%: 1.5 % (ref 0.0–14.0)
NEUT#: 12.5 10*3/uL — ABNORMAL HIGH (ref 1.5–6.5)
NEUT%: 77.8 % — AB (ref 39.0–75.0)
PLATELETS: 224 10*3/uL (ref 140–400)
RBC: 3.85 10*6/uL — AB (ref 4.20–5.82)
RDW: 16.8 % — ABNORMAL HIGH (ref 11.0–14.6)
WBC: 16.1 10*3/uL — AB (ref 4.0–10.3)
lymph#: 3.2 10*3/uL (ref 0.9–3.3)

## 2017-01-04 LAB — COMPREHENSIVE METABOLIC PANEL
ALT: 10 U/L (ref 0–55)
ANION GAP: 7 meq/L (ref 3–11)
AST: 12 U/L (ref 5–34)
Albumin: 3.2 g/dL — ABNORMAL LOW (ref 3.5–5.0)
Alkaline Phosphatase: 107 U/L (ref 40–150)
BILIRUBIN TOTAL: 0.47 mg/dL (ref 0.20–1.20)
BUN: 18.2 mg/dL (ref 7.0–26.0)
CHLORIDE: 104 meq/L (ref 98–109)
CO2: 29 meq/L (ref 22–29)
CREATININE: 1 mg/dL (ref 0.7–1.3)
Calcium: 9.2 mg/dL (ref 8.4–10.4)
EGFR: 69 mL/min/{1.73_m2} — ABNORMAL LOW (ref >90)
GLUCOSE: 115 mg/dL (ref 70–140)
Potassium: 4.5 mEq/L (ref 3.5–5.1)
SODIUM: 139 meq/L (ref 136–145)
TOTAL PROTEIN: 6.5 g/dL (ref 6.4–8.3)

## 2017-01-04 NOTE — Telephone Encounter (Signed)
spoke with son.  per the sone he was wondering if dr. Gretel Acre would do a phone consult.  they dont have transportation right now and it is very hard to get out and about.  pt sone would like for dr. Gretel Acre to call   dircetly to his dad spoke with son.  per the sone he was wondering if dr. Gretel Acre would do a phone consult.  they dont have transportation right now and it is very hard to get out and about.  pt sone would like for dr. Gretel Acre to call   dircetly to his dad

## 2017-01-04 NOTE — Telephone Encounter (Signed)
pt sone called  left message that pt needs a refill.

## 2017-01-05 NOTE — Telephone Encounter (Signed)
pt called states he needs a refill.  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  pt would like for you to call him.  he states he needs a refill on his medications but he unable to come in because  he has cancer. he has cancer.

## 2017-01-06 ENCOUNTER — Other Ambulatory Visit: Payer: Self-pay | Admitting: Psychiatry

## 2017-01-06 ENCOUNTER — Other Ambulatory Visit: Payer: Self-pay | Admitting: Nurse Practitioner

## 2017-01-06 MED ORDER — QUETIAPINE FUMARATE 25 MG PO TABS: 25.0000 mg | ORAL_TABLET | Freq: Every day | ORAL | 2 refills | Status: DC

## 2017-01-06 MED ORDER — ESCITALOPRAM OXALATE 5 MG PO TABS: ORAL_TABLET | ORAL | 2 refills | Status: DC

## 2017-01-06 NOTE — Telephone Encounter (Signed)
Meds refilled x 3 months

## 2017-01-07 ENCOUNTER — Ambulatory Visit
Admission: RE | Admit: 2017-01-07 | Discharge: 2017-01-07 | Disposition: A | Discharge status: Home / Self Care | Payer: Medicare Other | Source: Ambulatory Visit | Attending: Radiation Oncology | Admitting: Radiation Oncology

## 2017-01-07 DIAGNOSIS — Z510 Encounter for antineoplastic radiation therapy: Secondary | ICD-10-CM | POA: Diagnosis not present

## 2017-01-08 ENCOUNTER — Ambulatory Visit
Admission: RE | Admit: 2017-01-08 | Discharge: 2017-01-08 | Disposition: A | Discharge status: Home / Self Care | Payer: Medicare Other | Source: Ambulatory Visit | Attending: Radiation Oncology | Admitting: Radiation Oncology

## 2017-01-08 DIAGNOSIS — Z510 Encounter for antineoplastic radiation therapy: Secondary | ICD-10-CM | POA: Diagnosis not present

## 2017-01-10 ENCOUNTER — Other Ambulatory Visit: Payer: Self-pay | Admitting: Internal Medicine

## 2017-01-11 ENCOUNTER — Ambulatory Visit
Admission: RE | Admit: 2017-01-11 | Discharge: 2017-01-11 | Disposition: A | Discharge status: Home / Self Care | Payer: Medicare Other | Source: Ambulatory Visit | Attending: Radiation Oncology | Admitting: Radiation Oncology

## 2017-01-11 ENCOUNTER — Other Ambulatory Visit: Payer: Self-pay | Admitting: Medical Oncology

## 2017-01-11 ENCOUNTER — Telehealth: Payer: Self-pay | Admitting: Internal Medicine

## 2017-01-11 ENCOUNTER — Telehealth: Payer: Self-pay | Admitting: Medical Oncology

## 2017-01-11 ENCOUNTER — Other Ambulatory Visit (HOSPITAL_BASED_OUTPATIENT_CLINIC_OR_DEPARTMENT_OTHER): Payer: Medicare Other

## 2017-01-11 DIAGNOSIS — C3492 Malignant neoplasm of unspecified part of left bronchus or lung: Secondary | ICD-10-CM

## 2017-01-11 DIAGNOSIS — Z510 Encounter for antineoplastic radiation therapy: Secondary | ICD-10-CM | POA: Diagnosis not present

## 2017-01-11 DIAGNOSIS — C3432 Malignant neoplasm of lower lobe, left bronchus or lung: Secondary | ICD-10-CM

## 2017-01-11 DIAGNOSIS — Z5111 Encounter for antineoplastic chemotherapy: Secondary | ICD-10-CM

## 2017-01-11 LAB — CBC WITH DIFFERENTIAL/PLATELET
BASO%: 0.2 % (ref 0.0–2.0)
Basophils Absolute: 0 10*3/uL (ref 0.0–0.1)
EOS%: 0.1 % (ref 0.0–7.0)
Eosinophils Absolute: 0 10*3/uL (ref 0.0–0.5)
HEMATOCRIT: 32.8 % — AB (ref 38.4–49.9)
HGB: 10.2 g/dL — ABNORMAL LOW (ref 13.0–17.1)
LYMPH#: 2.4 10*3/uL (ref 0.9–3.3)
LYMPH%: 22.8 % (ref 14.0–49.0)
MCH: 26.5 pg — AB (ref 27.2–33.4)
MCHC: 31.1 g/dL — AB (ref 32.0–36.0)
MCV: 85.2 fL (ref 79.3–98.0)
MONO#: 0.7 10*3/uL (ref 0.1–0.9)
MONO%: 6.8 % (ref 0.0–14.0)
NEUT#: 7.4 10*3/uL — ABNORMAL HIGH (ref 1.5–6.5)
NEUT%: 70.1 % (ref 39.0–75.0)
Platelets: 108 10*3/uL — ABNORMAL LOW (ref 140–400)
RBC: 3.85 10*6/uL — AB (ref 4.20–5.82)
RDW: 17.5 % — ABNORMAL HIGH (ref 11.0–14.6)
WBC: 10.5 10*3/uL — ABNORMAL HIGH (ref 4.0–10.3)

## 2017-01-11 LAB — COMPREHENSIVE METABOLIC PANEL
ALT: 29 U/L (ref 0–55)
AST: 21 U/L (ref 5–34)
Albumin: 3 g/dL — ABNORMAL LOW (ref 3.5–5.0)
Alkaline Phosphatase: 96 U/L (ref 40–150)
Anion Gap: 7 mEq/L (ref 3–11)
BUN: 12.2 mg/dL (ref 7.0–26.0)
CHLORIDE: 104 meq/L (ref 98–109)
CO2: 28 mEq/L (ref 22–29)
CREATININE: 1.1 mg/dL (ref 0.7–1.3)
Calcium: 9.6 mg/dL (ref 8.4–10.4)
EGFR: 64 mL/min/{1.73_m2} — ABNORMAL LOW (ref >90)
GLUCOSE: 113 mg/dL (ref 70–140)
POTASSIUM: 4.6 meq/L (ref 3.5–5.1)
SODIUM: 139 meq/L (ref 136–145)
Total Bilirubin: 0.4 mg/dL (ref 0.20–1.20)
Total Protein: 6.8 g/dL (ref 6.4–8.3)

## 2017-01-11 NOTE — Telephone Encounter (Signed)
presented to scheduler desk today asking for appt to be seen . He declined to fill out walk in form and left. He wants to schedule an appt for tomorrow for low grade fever. He did not want appt today . He is seen daily in xrt. CBCD results are good today . Schedule message sent for smc tomorrow. I spoke to son and pts temp on Sunday was 101.2 f.  Son notified of appts

## 2017-01-11 NOTE — Telephone Encounter (Signed)
RX was approved by oncologist on 01/06/17 for # 30, I called pharmacy and had them cancelled rx for #30 and approved request for 90 day supply under EUBANKS, JESSICA K, NP

## 2017-01-11 NOTE — Telephone Encounter (Signed)
Patient came in stating he has had a slight fever but declined to fill out walk in sheet to see [facility] . Did not want tto be seen today. Per Heide Guile will follow up.

## 2017-01-12 ENCOUNTER — Ambulatory Visit
Admission: RE | Admit: 2017-01-12 | Discharge: 2017-01-12 | Disposition: A | Discharge status: Home / Self Care | Payer: Medicare Other | Source: Ambulatory Visit | Attending: Radiation Oncology | Admitting: Radiation Oncology

## 2017-01-12 ENCOUNTER — Ambulatory Visit: Payer: Medicare Other

## 2017-01-12 ENCOUNTER — Ambulatory Visit (HOSPITAL_BASED_OUTPATIENT_CLINIC_OR_DEPARTMENT_OTHER): Payer: Medicare Other | Admitting: Nurse Practitioner

## 2017-01-12 ENCOUNTER — Other Ambulatory Visit (HOSPITAL_BASED_OUTPATIENT_CLINIC_OR_DEPARTMENT_OTHER): Payer: Medicare Other

## 2017-01-12 VITALS — BP 96/58 | HR 87 | Temp 98.2°F | Resp 18 | Ht 70.0 in | Wt 157.3 lb

## 2017-01-12 DIAGNOSIS — R509 Fever, unspecified: Secondary | ICD-10-CM | POA: Diagnosis present

## 2017-01-12 DIAGNOSIS — Z5111 Encounter for antineoplastic chemotherapy: Secondary | ICD-10-CM

## 2017-01-12 DIAGNOSIS — C3492 Malignant neoplasm of unspecified part of left bronchus or lung: Secondary | ICD-10-CM

## 2017-01-12 DIAGNOSIS — R300 Dysuria: Secondary | ICD-10-CM

## 2017-01-12 DIAGNOSIS — Z95828 Presence of other vascular implants and grafts: Secondary | ICD-10-CM | POA: Insufficient documentation

## 2017-01-12 DIAGNOSIS — Z510 Encounter for antineoplastic radiation therapy: Secondary | ICD-10-CM | POA: Diagnosis not present

## 2017-01-12 LAB — CBC WITH DIFFERENTIAL/PLATELET
BASO%: 0.3 % (ref 0.0–2.0)
Basophils Absolute: 0 10*3/uL (ref 0.0–0.1)
EOS%: 0 % (ref 0.0–7.0)
Eosinophils Absolute: 0 10*3/uL (ref 0.0–0.5)
HCT: 34.6 % — ABNORMAL LOW (ref 38.4–49.9)
HEMOGLOBIN: 10.9 g/dL — AB (ref 13.0–17.1)
LYMPH%: 24.7 % (ref 14.0–49.0)
MCH: 26.8 pg — ABNORMAL LOW (ref 27.2–33.4)
MCHC: 31.5 g/dL — ABNORMAL LOW (ref 32.0–36.0)
MCV: 85 fL (ref 79.3–98.0)
MONO#: 0.6 10*3/uL (ref 0.1–0.9)
MONO%: 5.6 % (ref 0.0–14.0)
NEUT%: 69.4 % (ref 39.0–75.0)
NEUTROS ABS: 7 10*3/uL — AB (ref 1.5–6.5)
Platelets: 134 10*3/uL — ABNORMAL LOW (ref 140–400)
RBC: 4.07 10*6/uL — ABNORMAL LOW (ref 4.20–5.82)
RDW: 17.3 % — AB (ref 11.0–14.6)
WBC: 10.1 10*3/uL (ref 4.0–10.3)
lymph#: 2.5 10*3/uL (ref 0.9–3.3)

## 2017-01-12 LAB — URINALYSIS, MICROSCOPIC - CHCC
BILIRUBIN (URINE): NEGATIVE
Glucose: NEGATIVE mg/dL
Ketones: NEGATIVE mg/dL
LEUKOCYTE ESTERASE: NEGATIVE
NITRITE: NEGATIVE
Protein: NEGATIVE mg/dL
Specific Gravity, Urine: 1.005 (ref 1.003–1.035)
UROBILINOGEN UR: 0.2 mg/dL (ref 0.2–1)
pH: 6.5 (ref 4.6–8.0)

## 2017-01-12 LAB — COMPREHENSIVE METABOLIC PANEL
ALBUMIN: 3.1 g/dL — AB (ref 3.5–5.0)
ALK PHOS: 103 U/L (ref 40–150)
ALT: 25 U/L (ref 0–55)
AST: 17 U/L (ref 5–34)
Anion Gap: 11 mEq/L (ref 3–11)
BILIRUBIN TOTAL: 0.43 mg/dL (ref 0.20–1.20)
BUN: 13 mg/dL (ref 7.0–26.0)
CO2: 26 meq/L (ref 22–29)
CREATININE: 1.2 mg/dL (ref 0.7–1.3)
Calcium: 9.6 mg/dL (ref 8.4–10.4)
Chloride: 104 mEq/L (ref 98–109)
EGFR: 55 mL/min/{1.73_m2} — AB (ref >90)
GLUCOSE: 124 mg/dL (ref 70–140)
Potassium: 3.8 mEq/L (ref 3.5–5.1)
SODIUM: 141 meq/L (ref 136–145)
TOTAL PROTEIN: 7.1 g/dL (ref 6.4–8.3)

## 2017-01-12 MED ORDER — SODIUM CHLORIDE 0.9% FLUSH
10.0000 mL | INTRAVENOUS | Status: DC | PRN
  Administered 2017-01-12: 10 mL via INTRAVENOUS
  Filled 2017-01-12: qty 10

## 2017-01-12 MED ORDER — HEPARIN SOD (PORK) LOCK FLUSH 100 UNIT/ML IV SOLN
500.0000 [IU] | Freq: Once | INTRAVENOUS | Status: AC | PRN
  Administered 2017-01-12: 500 [IU] via INTRAVENOUS
  Filled 2017-01-12: qty 5

## 2017-01-12 NOTE — Patient Instructions (Signed)
Nausea, Adult  Nausea is the feeling of an upset stomach or having to vomit. Nausea on its own is not usually a serious concern, but it may be an early sign of a more serious medical problem. As nausea gets worse, it can lead to vomiting. If vomiting develops, or if you are not able to drink enough fluids, you are at risk of becoming dehydrated. Dehydration can make you tired and thirsty, cause you to have a dry mouth, and decrease how often you urinate. Older adults and people with other diseases or a weak immune system are at higher risk for dehydration. The main goals of treating your nausea are:  · To limit repeated nausea episodes.  · To prevent vomiting and dehydration.    Follow these instructions at home:  Follow instructions from your health care provider about how to care for yourself at home.  Eating and drinking   Follow these recommendations as told by your health care provider:  · Take an oral rehydration solution (ORS). This is a drink that is sold at pharmacies and retail stores.  · Drink clear fluids in small amounts as you are able. Clear fluids include water, ice chips, diluted fruit juice, and low-calorie sports drinks.  · Eat bland, easy-to-digest foods in small amounts as you are able. These foods include bananas, applesauce, rice, lean meats, toast, and crackers.  · Avoid drinking fluids that contain a lot of sugar or caffeine, such as energy drinks, sports drinks, and soda.  · Avoid alcohol.  · Avoid spicy or fatty foods.    General instructions   · Drink enough fluid to keep your urine clear or pale yellow.  · Wash your hands often. If soap and water are not available, use hand sanitizer.  · Make sure that all people in your household wash their hands well and often.  · Rest at home while you recover.  · Take over-the-counter and prescription medicines only as told by your health care provider.  · Breathe slowly and deeply when you feel nauseous.  · Watch your condition for any  changes.  · Keep all follow-up visits as told by your health care provider. This is important.  Contact a health care provider if:  · You have a headache.  · You have new symptoms.  · Your nausea gets worse.  · You have a fever.  · You feel light-headed or dizzy.  · You vomit.  · You cannot keep fluids down.  Get help right away if:  · You have pain in your chest, neck, arm, or jaw.  · You feel extremely weak or you faint.  · You have vomit that is bright red or looks like coffee grounds.  · You have bloody or black stools or stools that look like tar.  · You have a severe headache, a stiff neck, or both.  · You have severe pain, cramping, or bloating in your abdomen.  · You have a rash.  · You have difficulty breathing or are breathing very quickly.  · Your heart is beating very quickly.  · Your skin feels cold and clammy.  · You feel confused.  · You have pain when you urinate.  · You have signs of dehydration, such as:  ? Dark urine, very little, or no urine.  ? Cracked lips.  ? Dry mouth.  ? Sunken eyes.  ? Sleepiness.  ? Weakness.  These symptoms may represent a serious problem that is an emergency. Do   not wait to see if the symptoms will go away. Get medical help right away. Call your local emergency services (911 in the [place] Do not drive yourself to the hospital.  This information is not intended to replace advice given to you by your health care provider. Make sure you discuss any questions you have with your health care provider.  Document Released: 11/05/2004 Document Revised: 03/02/2016 Document Reviewed: 06/04/2015  Elsevier Interactive Patient Education © 2017 [employer]

## 2017-01-13 ENCOUNTER — Ambulatory Visit
Admission: RE | Admit: 2017-01-13 | Discharge: 2017-01-13 | Disposition: A | Discharge status: Home / Self Care | Payer: Medicare Other | Source: Ambulatory Visit | Attending: Radiation Oncology | Admitting: Radiation Oncology

## 2017-01-13 ENCOUNTER — Encounter: Payer: Self-pay | Admitting: Nurse Practitioner

## 2017-01-13 DIAGNOSIS — R509 Fever, unspecified: Secondary | ICD-10-CM | POA: Insufficient documentation

## 2017-01-13 DIAGNOSIS — Z510 Encounter for antineoplastic radiation therapy: Secondary | ICD-10-CM | POA: Diagnosis not present

## 2017-01-13 NOTE — Assessment & Plan Note (Signed)
Patient received cycle 2, day 1 of his carboplatin/etoposide chemotherapy regimen on 12/28/2016.  He also received Neulasta for growth factor support.  He also continues to receive daily radiation treatments as well.  Patient is scheduled to return for a CT of his chest on 01/14/2017.  He scheduled to return for labs, flush, visit, and his next cycle of chemotherapy on 01/18/2017.

## 2017-01-13 NOTE — Progress Notes (Signed)
SYMPTOM MANAGEMENT CLINIC    Chief Complaint: Fever  HPI:  Christian Munoz 82 y.o. male diagnosed with small cell lung cancer.  Currently undergoing carboplatin/etoposide chemotherapy regimen as well as radiation treatments.    No history exists.    Review of Systems  Constitutional: Positive for fever and malaise/fatigue.  All other systems reviewed and are negative.   Past Medical History:  Diagnosis Date  . Allergic rhinitis due to pollen   . Anginal pain ([place])    Past medical history " does not seem to be a problem at all now."  . Coronary atherosclerosis of native coronary artery   . Coronary atherosclerosis of native coronary artery   . Depressive disorder, not elsewhere classified   . Encounter for antineoplastic chemotherapy 11/30/2016  . GERD (gastroesophageal reflux disease)   . Goals of care, counseling/discussion 11/30/2016  . Headache    PMH: Migraines  . Herpes genitalia   . Hypertrophy of prostate without urinary obstruction and other lower urinary tract symptoms (LUTS)   . Hypertrophy of prostate without urinary obstruction and other lower urinary tract symptoms (LUTS)   . Lack of coordination   . Lack of coordination   . Lumbago   . Nausea and vomiting 11/30/2016  . Osteoarthrosis, unspecified whether generalized or localized, unspecified site   . Osteoarthrosis, unspecified whether generalized or localized, unspecified site   . Other and unspecified hyperlipidemia   . Other malaise and fatigue   . Pneumonia   . Reflux esophagitis   . Skin cancer   . Sleep apnea    does not wear CPAP  . Syncope and collapse   . Urinary frequency   . Vitamin D deficiency     Past Surgical History:  Procedure Laterality Date  . CARDIAC CATHETERIZATION    . COLONOSCOPY    . EYE SURGERY    . IR GENERIC HISTORICAL  12/04/2016   IR US GUIDE VASC ACCESS RIGHT 12/04/2016 Greggory Keen, MD [facility]  . IR GENERIC HISTORICAL  12/04/2016   IR FLUORO GUIDE PORT  INSERTION RIGHT 12/04/2016 Greggory Keen, MD [facility]  . poly vocal cords  1985  . SKIN CANCER EXCISION  2011  . TONSILLECTOMY    . VASECTOMY    . VIDEO BRONCHOSCOPY WITH ENDOBRONCHIAL ULTRASOUND N/A 11/20/2016   Procedure: VIDEO BRONCHOSCOPY WITH ENDOBRONCHIAL ULTRASOUND;  Surgeon: Collene Gobble, MD;  Location: [place] [facility];  Service: Thoracic;  Laterality: N/A;    has Hyperlipidemia; Reflux esophagitis; Osteoarthrosis, unspecified whether generalized or localized, unspecified site; Hypertension; Benign prostatic hyperplasia with urinary obstruction; Depression; History of fall; Small cell lung cancer, left ([place]); Goals of care, counseling/discussion; Encounter for antineoplastic chemotherapy; Port catheter in place; and Fever on his problem list.    is allergic to chantix [varenicline] and zoloft [sertraline hcl].  Allergies as of 01/12/2017      Reactions   Chantix [varenicline] Other (See Comments)   Makes patient suicidal   Zoloft [sertraline Hcl] Other (See Comments)   Makes patient suicidal      Medication List       Accurate as of 01/12/17 11:59 PM. Always use your most recent med list.          aspirin 81 MG tablet Take 81 mg by mouth daily. Take one tablet once a day   escitalopram 5 MG tablet Commonly known as:  LEXAPRO TAKE 1 TABLET(5 MG) BY MOUTH EVERY MORNING   escitalopram 5 MG tablet Commonly known as:  LEXAPRO TAKE 1  TABLET(5 MG) BY MOUTH EVERY MORNING   fish oil-omega-3 fatty acids 1000 MG capsule Take 2 g by mouth daily. Take one tablet twice a day   GLUCOSAMINE 1500 COMPLEX Caps Take 2 capsules by mouth daily.   lidocaine-prilocaine cream Commonly known as:  EMLA Apply 1 application topically as needed.   metoprolol tartrate 25 MG tablet Commonly known as:  LOPRESSOR TAKE 1 TABLET BY MOUTH TWICE DAILY   pantoprazole 20 MG tablet Commonly known as:  PROTONIX TAKE 1 TABLET(20 MG) BY MOUTH DAILY   PRESERVISION AREDS 2 Caps Take 2 capsules by mouth  daily.   multivitamin with minerals tablet Take 1 tablet by mouth daily.   prochlorperazine 10 MG tablet Commonly known as:  COMPAZINE Take 1 tablet (10 mg total) by mouth every 6 (six) hours as needed for nausea or vomiting.   QUEtiapine 25 MG tablet Commonly known as:  SEROQUEL Take 1 tablet (25 mg total) by mouth at bedtime. 1/2- 1 pill po qhs   simvastatin 20 MG tablet Commonly known as:  ZOCOR TAKE 1 TABLET(20 MG) BY MOUTH DAILY   tamsulosin 0.4 MG Caps capsule Commonly known as:  FLOMAX TAKE 1 CAPSULE(0.4 MG) BY MOUTH DAILY        PHYSICAL EXAMINATION  Oncology Vitals 01/12/2017 01/01/2017  Height 178 cm -  Weight 71.351 kg -  Weight (lbs) 157 lbs 5 oz -  BMI (kg/m2) 22.57 kg/m2 -  Temp 98.2 97.7  Pulse 87 82  Resp 18 18  SpO2 97 98  BSA (m2) 1.88 m2 -   BP Readings from Last 2 Encounters:  01/12/17 (!) 96/58  01/01/17 120/68    Physical Exam  Constitutional: He is oriented to person, place, and time. He appears malnourished. He appears unhealthy. He appears cachectic.  HENT:  Head: Normocephalic and atraumatic.  Mouth/Throat: Oropharynx is clear and moist.  Eyes: Conjunctivae are normal. Right eye exhibits no discharge. Left eye exhibits no discharge. No scleral icterus.  Patient is blind as baseline due to chronic macular degeneration.  Uses a cane to assist in ambulation.  Neck: Normal range of motion. Neck supple. No JVD present. No tracheal deviation present. No thyromegaly present.  Cardiovascular: Normal rate, regular rhythm, normal heart sounds and intact distal pulses.   Pulmonary/Chest: Effort normal and breath sounds normal. No respiratory distress. He has no wheezes. He has no rales. He exhibits no tenderness.  Abdominal: Soft. Bowel sounds are normal. He exhibits no distension and no mass. There is no tenderness. There is no rebound and no guarding.  Musculoskeletal: Normal range of motion. He exhibits no edema, tenderness or deformity.    Lymphadenopathy:    He has no cervical adenopathy.  Neurological: He is alert and oriented to person, place, and time. Gait normal.  Skin: Skin is warm and dry. No rash noted. No erythema. No pallor.  Psychiatric: Affect normal.  Nursing note and vitals reviewed.   LABORATORY DATA:. Appointment on 01/12/2017  Component Date Value Ref Range Status  . WBC 01/12/2017 10.1  4.0 - 10.3 10e3/uL Final  . NEUT# 01/12/2017 7.0* 1.5 - 6.5 10e3/uL Final  . HGB 01/12/2017 10.9* 13.0 - 17.1 g/dL Final  . HCT 01/12/2017 34.6* 38.4 - 49.9 % Final  . Platelets 01/12/2017 134* 140 - 400 10e3/uL Final  . MCV 01/12/2017 85.0  79.3 - 98.0 fL Final  . MCH 01/12/2017 26.8* 27.2 - 33.4 pg Final  . MCHC 01/12/2017 31.5* 32.0 - 36.0 g/dL Final  . RBC  01/12/2017 4.07* 4.20 - 5.82 10e6/uL Final  . RDW 01/12/2017 17.3* 11.0 - 14.6 % Final  . lymph# 01/12/2017 2.5  0.9 - 3.3 10e3/uL Final  . MONO# 01/12/2017 0.6  0.1 - 0.9 10e3/uL Final  . Eosinophils Absolute 01/12/2017 0.0  0.0 - 0.5 10e3/uL Final  . Basophils Absolute 01/12/2017 0.0  0.0 - 0.1 10e3/uL Final  . NEUT% 01/12/2017 69.4  39.0 - 75.0 % Final  . LYMPH% 01/12/2017 24.7  14.0 - 49.0 % Final  . MONO% 01/12/2017 5.6  0.0 - 14.0 % Final  . EOS% 01/12/2017 0.0  0.0 - 7.0 % Final  . BASO% 01/12/2017 0.3  0.0 - 2.0 % Final  . Sodium 01/12/2017 141  136 - 145 mEq/L Final  . Potassium 01/12/2017 3.8  3.5 - 5.1 mEq/L Final  . Chloride 01/12/2017 104  98 - 109 mEq/L Final  . CO2 01/12/2017 26  22 - 29 mEq/L Final  . Glucose 01/12/2017 124  70 - 140 mg/dl Final  . BUN 01/12/2017 13.0  7.0 - 26.0 mg/dL Final  . Creatinine 01/12/2017 1.2  0.7 - 1.3 mg/dL Final  . Total Bilirubin 01/12/2017 0.43  0.20 - 1.20 mg/dL Final  . Alkaline Phosphatase 01/12/2017 103  40 - 150 U/L Final  . AST 01/12/2017 17  5 - 34 U/L Final  . ALT 01/12/2017 25  0 - 55 U/L Final  . Total Protein 01/12/2017 7.1  6.4 - 8.3 g/dL Final  . Albumin 01/12/2017 3.1* 3.5 - 5.0 g/dL Final   . Calcium 01/12/2017 9.6  8.4 - 10.4 mg/dL Final  . Anion Gap 01/12/2017 11  3 - 11 mEq/L Final  . EGFR 01/12/2017 55* >90 ml/min/1.73 m2 Final  . Glucose 01/12/2017 Negative  Negative mg/dL Final  . Bilirubin (Urine) 01/12/2017 Negative  Negative Final  . Ketones 01/12/2017 Negative  Negative mg/dL Final  . Specific Gravity, Urine 01/12/2017 1.005  1.003 - 1.035 Final  . Blood 01/12/2017 Moderate  Negative Final  . pH 01/12/2017 6.5  4.6 - 8.0 Final  . Protein 01/12/2017 Negative  Negative- <30 mg/dL Final  . Urobilinogen, UR 01/12/2017 0.2  0.2 - 1 mg/dL Final  . Nitrite 01/12/2017 Negative  Negative Final  . Leukocyte Esterase 01/12/2017 Negative  Negative Final  . RBC / HPF 01/12/2017 21-50  0 - 2 Final  . WBC, UA 01/12/2017 0-2  0-2;Negative Final  . Bacteria, UA 01/12/2017 Trace  Negative- Trace Final  . Epithelial Cells 01/12/2017 Occasional  Negative- Few Final  Appointment on 01/11/2017  Component Date Value Ref Range Status  . WBC 01/11/2017 10.5* 4.0 - 10.3 10e3/uL Final  . NEUT# 01/11/2017 7.4* 1.5 - 6.5 10e3/uL Final  . HGB 01/11/2017 10.2* 13.0 - 17.1 g/dL Final  . HCT 01/11/2017 32.8* 38.4 - 49.9 % Final  . Platelets 01/11/2017 108* 140 - 400 10e3/uL Final  . MCV 01/11/2017 85.2  79.3 - 98.0 fL Final  . MCH 01/11/2017 26.5* 27.2 - 33.4 pg Final  . MCHC 01/11/2017 31.1* 32.0 - 36.0 g/dL Final  . RBC 01/11/2017 3.85* 4.20 - 5.82 10e6/uL Final  . RDW 01/11/2017 17.5* 11.0 - 14.6 % Final  . lymph# 01/11/2017 2.4  0.9 - 3.3 10e3/uL Final  . MONO# 01/11/2017 0.7  0.1 - 0.9 10e3/uL Final  . Eosinophils Absolute 01/11/2017 0.0  0.0 - 0.5 10e3/uL Final  . Basophils Absolute 01/11/2017 0.0  0.0 - 0.1 10e3/uL Final  . NEUT% 01/11/2017 70.1  39.0 - 75.0 %  Final  . LYMPH% 01/11/2017 22.8  14.0 - 49.0 % Final  . MONO% 01/11/2017 6.8  0.0 - 14.0 % Final  . EOS% 01/11/2017 0.1  0.0 - 7.0 % Final  . BASO% 01/11/2017 0.2  0.0 - 2.0 % Final  . Sodium 01/11/2017 139  136 - 145  mEq/L Final  . Potassium 01/11/2017 4.6  3.5 - 5.1 mEq/L Final  . Chloride 01/11/2017 104  98 - 109 mEq/L Final  . CO2 01/11/2017 28  22 - 29 mEq/L Final  . Glucose 01/11/2017 113  70 - 140 mg/dl Final  . BUN 01/11/2017 12.2  7.0 - 26.0 mg/dL Final  . Creatinine 01/11/2017 1.1  0.7 - 1.3 mg/dL Final  . Total Bilirubin 01/11/2017 0.40  0.20 - 1.20 mg/dL Final  . Alkaline Phosphatase 01/11/2017 96  40 - 150 U/L Final  . AST 01/11/2017 21  5 - 34 U/L Final  . ALT 01/11/2017 29  0 - 55 U/L Final  . Total Protein 01/11/2017 6.8  6.4 - 8.3 g/dL Final  . Albumin 01/11/2017 3.0* 3.5 - 5.0 g/dL Final  . Calcium 01/11/2017 9.6  8.4 - 10.4 mg/dL Final  . Anion Gap 01/11/2017 7  3 - 11 mEq/L Final  . EGFR 01/11/2017 64* >90 ml/min/1.73 m2 Final    RADIOGRAPHIC STUDIES: No results found.  ASSESSMENT/PLAN:    Small cell lung cancer, left ([place]) Patient received cycle 2, day 1 of his carboplatin/etoposide chemotherapy regimen on 12/28/2016.  He also received Neulasta for growth factor support.  He also continues to receive daily radiation treatments as well.  Patient is scheduled to return for a CT of his chest on 01/14/2017.  He scheduled to return for labs, flush, visit, and his next cycle of chemotherapy on 01/18/2017.  Fever  Patient states that he has been having some intermittent fevers to a maximum of 101 or 102, approximately once per day since this past weekend.  He states that now he only has a low-grade fever of approximately 99 on occasion.  He has no other new symptoms whatsoever.  He denies any URI symptoms or UTI symptoms.  He has no new aches or pains.  On exam today.  Patient's lungs are clear bilaterally with no cough or wheeze.  There is no issues with history distress whatsoever.  Abdomen is soft and nontender and there is no abdominal tenderness or flank tenderness with palpation.  Vital signs were stable today with the exception of a slightly low blood pressure at 96/58.   Temperature was 98.2.  Patient states that he has a history of hypertension; and typically takes his blood pressure medications.  However, he did not take his blood pressure medication earlier this morning.  Labs were essentially within normal limits; and urinalysis obtained today did not reveal an obvious UTI.  Awaiting urine culture results and blood culture results.  Reviewed all findings with Dr. Julien Nordmann; and Dr. Julien Nordmann.  Advised treating any fever with Tylenol.  Also, patient was advised to call/return or go directly to the emergency department for any worsening symptoms whatsoever.  In regards to patient's slightly lower blood pressure-patient states that he is following up with his primary care provider on Thursday, 01/14/2017; and will review his blood pressure medications at that time.   Patient stated understanding of all instructions; and was in agreement with this plan of care. The patient knows to call the clinic with any problems, questions or concerns.   Total time spent with patient  was 25 minutes;  with greater than 75 percent of that time spent in face to face counseling regarding patient's symptoms,  and coordination of care and follow up.  Disclaimer:This dictation was prepared with Dragon/digital dictation along with [employer]. Any transcriptional errors that result from this process are unintentional.  Drue Second, NP 01/13/2017

## 2017-01-13 NOTE — Assessment & Plan Note (Signed)
Patient states that he has been having some intermittent fevers to a maximum of 101 or 102, approximately once per day since this past weekend.  He states that now he only has a low-grade fever of approximately 99 on occasion.  He has no other new symptoms whatsoever.  He denies any URI symptoms or UTI symptoms.  He has no new aches or pains.  On exam today.  Patient's lungs are clear bilaterally with no cough or wheeze.  There is no issues with history distress whatsoever.  Abdomen is soft and nontender and there is no abdominal tenderness or flank tenderness with palpation.  Vital signs were stable today with the exception of a slightly low blood pressure at 96/58.  Temperature was 98.2.  Patient states that he has a history of hypertension; and typically takes his blood pressure medications.  However, he did not take his blood pressure medication earlier this morning.  Labs were essentially within normal limits; and urinalysis obtained today did not reveal an obvious UTI.  Awaiting urine culture results and blood culture results.  Reviewed all findings with Dr. Julien Nordmann; and Dr. Julien Nordmann.  Advised treating any fever with Tylenol.  Also, patient was advised to call/return or go directly to the emergency department for any worsening symptoms whatsoever.  In regards to patient's slightly lower blood pressure-patient states that he is following up with his primary care provider on Thursday, 01/14/2017; and will review his blood pressure medications at that time.

## 2017-01-14 ENCOUNTER — Ambulatory Visit
Admission: RE | Admit: 2017-01-14 | Discharge: 2017-01-14 | Disposition: A | Discharge status: Home / Self Care | Payer: Medicare Other | Source: Ambulatory Visit | Attending: Radiation Oncology | Admitting: Radiation Oncology

## 2017-01-14 ENCOUNTER — Ambulatory Visit (INDEPENDENT_AMBULATORY_CARE_PROVIDER_SITE_OTHER): Payer: Medicare Other | Admitting: Nurse Practitioner

## 2017-01-14 ENCOUNTER — Encounter: Payer: Self-pay | Admitting: Nurse Practitioner

## 2017-01-14 ENCOUNTER — Ambulatory Visit (INDEPENDENT_AMBULATORY_CARE_PROVIDER_SITE_OTHER): Payer: Medicare Other

## 2017-01-14 ENCOUNTER — Ambulatory Visit (HOSPITAL_COMMUNITY)
Admission: RE | Admit: 2017-01-14 | Discharge: 2017-01-14 | Disposition: A | Discharge status: Home / Self Care | Payer: Medicare Other | Source: Ambulatory Visit | Attending: Internal Medicine | Admitting: Internal Medicine

## 2017-01-14 ENCOUNTER — Ambulatory Visit: Payer: Medicare Other

## 2017-01-14 VITALS — BP 132/74 | HR 84 | Temp 97.9°F | Resp 18 | Ht 70.0 in | Wt 159.0 lb

## 2017-01-14 VITALS — BP 132/74 | HR 84 | Temp 97.9°F | Ht 70.0 in | Wt 159.0 lb

## 2017-01-14 DIAGNOSIS — K2100 Gastro-esophageal reflux disease with esophagitis, without bleeding: Secondary | ICD-10-CM

## 2017-01-14 DIAGNOSIS — Z0000 Encounter for general adult medical examination without abnormal findings: Secondary | ICD-10-CM | POA: Diagnosis not present

## 2017-01-14 DIAGNOSIS — I2510 Atherosclerotic heart disease of native coronary artery without angina pectoris: Secondary | ICD-10-CM | POA: Diagnosis not present

## 2017-01-14 DIAGNOSIS — J181 Lobar pneumonia, unspecified organism: Secondary | ICD-10-CM | POA: Insufficient documentation

## 2017-01-14 DIAGNOSIS — C3492 Malignant neoplasm of unspecified part of left bronchus or lung: Secondary | ICD-10-CM | POA: Diagnosis present

## 2017-01-14 DIAGNOSIS — Z510 Encounter for antineoplastic radiation therapy: Secondary | ICD-10-CM | POA: Diagnosis not present

## 2017-01-14 DIAGNOSIS — I10 Essential (primary) hypertension: Secondary | ICD-10-CM | POA: Diagnosis not present

## 2017-01-14 DIAGNOSIS — E785 Hyperlipidemia, unspecified: Secondary | ICD-10-CM | POA: Diagnosis not present

## 2017-01-14 DIAGNOSIS — Z5111 Encounter for antineoplastic chemotherapy: Secondary | ICD-10-CM

## 2017-01-14 DIAGNOSIS — F331 Major depressive disorder, recurrent, moderate: Secondary | ICD-10-CM

## 2017-01-14 MED ORDER — IOPAMIDOL (ISOVUE-300) INJECTION 61%
INTRAVENOUS | Status: AC
  Administered 2017-01-14: 75 mL
  Filled 2017-01-14: qty 75

## 2017-01-14 MED ORDER — METOPROLOL TARTRATE 25 MG PO TABS: 12.5000 mg | ORAL_TABLET | Freq: Two times a day (BID) | ORAL | 1 refills | Status: DC

## 2017-01-14 NOTE — Progress Notes (Signed)
Subjective:   Christian Munoz is a 82 y.o. male who presents for an Initial Medicare Annual Wellness Visit.     Objective:    Today's Vitals   01/14/17 1248  BP: 132/74  Pulse: 84  Temp: 97.9 F (36.6 C)  TempSrc: Oral  SpO2: 97%  Weight: 159 lb (72.1 kg)  Height: '5\' 10"'$  (1.778 m)   Body mass index is 22.81 kg/m.  Current Medications (verified) Outpatient Encounter Prescriptions as of 01/14/2017  Medication Sig  . aspirin 81 MG tablet Take 81 mg by mouth daily. Take one tablet once a day  . escitalopram (LEXAPRO) 5 MG tablet TAKE 1 TABLET(5 MG) BY MOUTH EVERY MORNING  . fish oil-omega-3 fatty acids 1000 MG capsule Take 2 g by mouth daily. Take one tablet twice a day  . Multiple Vitamins-Minerals (MULTIVITAMIN WITH MINERALS) tablet Take 1 tablet by mouth daily.  . pantoprazole (PROTONIX) 20 MG tablet TAKE 1 TABLET(20 MG) BY MOUTH DAILY  . prochlorperazine (COMPAZINE) 10 MG tablet Take 1 tablet (10 mg total) by mouth every 6 (six) hours as needed for nausea or vomiting.  Marland Kitchen QUEtiapine (SEROQUEL) 25 MG tablet Take 1 tablet (25 mg total) by mouth at bedtime. 1/2- 1 pill po qhs  . simvastatin (ZOCOR) 20 MG tablet TAKE 1 TABLET(20 MG) BY MOUTH DAILY  . tamsulosin (FLOMAX) 0.4 MG CAPS capsule TAKE 1 CAPSULE(0.4 MG) BY MOUTH DAILY  . metoprolol tartrate (LOPRESSOR) 25 MG tablet TAKE 1 TABLET BY MOUTH TWICE DAILY  . [DISCONTINUED] escitalopram (LEXAPRO) 5 MG tablet TAKE 1 TABLET(5 MG) BY MOUTH EVERY MORNING  . [DISCONTINUED] Glucosamine-Chondroit-Vit C-Mn (GLUCOSAMINE 1500 COMPLEX) CAPS Take 2 capsules by mouth daily.  . [DISCONTINUED] lidocaine-prilocaine (EMLA) cream Apply 1 application topically as needed. (Patient not taking: Reported on 01/12/2017)  . [DISCONTINUED] Multiple Vitamins-Minerals (PRESERVISION AREDS 2) CAPS Take 2 capsules by mouth daily.   No facility-administered encounter medications on file as of 01/14/2017.     Allergies (verified) Chantix [varenicline] and  Zoloft [sertraline hcl]   History: Past Medical History:  Diagnosis Date  . Allergic rhinitis due to pollen   . Anginal pain ([place])    Past medical history " does not seem to be a problem at all now."  . Coronary atherosclerosis of native coronary artery   . Coronary atherosclerosis of native coronary artery   . Depressive disorder, not elsewhere classified   . Encounter for antineoplastic chemotherapy 11/30/2016  . GERD (gastroesophageal reflux disease)   . Goals of care, counseling/discussion 11/30/2016  . Headache    PMH: Migraines  . Herpes genitalia   . Hypertrophy of prostate without urinary obstruction and other lower urinary tract symptoms (LUTS)   . Hypertrophy of prostate without urinary obstruction and other lower urinary tract symptoms (LUTS)   . Lack of coordination   . Lack of coordination   . Lumbago   . Nausea and vomiting 11/30/2016  . Osteoarthrosis, unspecified whether generalized or localized, unspecified site   . Osteoarthrosis, unspecified whether generalized or localized, unspecified site   . Other and unspecified hyperlipidemia   . Other malaise and fatigue   . Pneumonia   . Reflux esophagitis   . Skin cancer   . Sleep apnea    does not wear CPAP  . Syncope and collapse   . Urinary frequency   . Vitamin D deficiency    Past Surgical History:  Procedure Laterality Date  . CARDIAC CATHETERIZATION    . COLONOSCOPY    . EYE  SURGERY    . IR GENERIC HISTORICAL  12/04/2016   IR US GUIDE VASC ACCESS RIGHT 12/04/2016 Greggory Keen, MD [facility]  . IR GENERIC HISTORICAL  12/04/2016   IR FLUORO GUIDE PORT INSERTION RIGHT 12/04/2016 Greggory Keen, MD [facility]  . poly vocal cords  1985  . SKIN CANCER EXCISION  2011  . TONSILLECTOMY    . VASECTOMY    . VIDEO BRONCHOSCOPY WITH ENDOBRONCHIAL ULTRASOUND N/A 11/20/2016   Procedure: VIDEO BRONCHOSCOPY WITH ENDOBRONCHIAL ULTRASOUND;  Surgeon: Collene Gobble, MD;  Location: [facility] OR;  Service: Thoracic;  Laterality:  N/A;   Family History  Problem Relation Age of Onset  . Heart disease Father   . Cancer Father   . Cancer Brother   . Cancer Brother    Social History   Occupational History  . Not on file.   Social History Main Topics  . Smoking status: Former Smoker    Packs/day: 1.00    Years: 30.00    Types: Cigarettes    Quit date: 08/12/2016  . Smokeless tobacco: Never Used     Comment: 3 per day  . Alcohol use No  . Drug use: No  . Sexual activity: Not Currently   Tobacco Counseling Counseling given: Not Answered   Activities of Daily Living In your present state of health, do you have any difficulty performing the following activities: 01/14/2017 12/04/2016  Hearing? N N  Vision? N N  Difficulty concentrating or making decisions? Y N  Walking or climbing stairs? N N  Dressing or bathing? N N  Doing errands, shopping? N -  Preparing Food and eating ? N -  Using the Toilet? N -  In the past six months, have you accidently leaked urine? N -  Do you have problems with loss of bowel control? N -  Managing your Medications? N -  Managing your Finances? Y -  Housekeeping or managing your Housekeeping? Y -  Some recent data might be hidden    Immunizations and Health Maintenance Immunization History  Administered Date(s) Administered  . Pneumococcal Conjugate-13 12/04/2014  . Pneumococcal Polysaccharide-23 07/13/2016   There are no preventive care reminders to display for this patient.  Patient Care Team: [name], NP as PCP - General (Nurse Practitioner) [name], OD as Referring Physician (Optometry)  Indicate any recent Medical Services you may have received from other than [facility] providers in the past year (date may be approximate).    Assessment:   This is a routine wellness examination for [facility].   Hearing/Vision screen No exam data present  Dietary issues and exercise activities discussed: Current Exercise Habits: The patient does not participate in  regular exercise at present, Exercise limited by: None identified  Goals    . Good person          Starting 01/14/2017 I will continue living my life and being a good person.       Depression Screen PHQ 2/9 Scores 01/14/2017 12/24/2016 10/22/2016 03/30/2016  PHQ - 2 Score 3 0 0 0  PHQ- 9 Score - - - -    Fall Risk Fall Risk  01/14/2017 12/24/2016 10/27/2016 10/22/2016 07/13/2016  Falls in the past year? No No No No No  Number falls in past yr: - - - - -  Injury with Fall? - - - - -  Risk Factor Category  - - - - -  Risk for fall due to : - History of fall(s) - - -  Follow up - - - - -    Cognitive Function: MMSE - Mini Mental State Exam 01/14/2017 08/09/2014  Orientation to time 5 5  Orientation to Place 5 4  Registration 3 3  Attention/ Calculation 5 5  Recall 3 3  Language- name 2 objects 0 2  Language- repeat 1 1  Language- follow 3 step command 3 3  Language- read & follow direction 1 1  Write a sentence 0 1  Copy design 0 1  Total score 26 29  Some encounter information is confidential and restricted. Go to Review Flowsheets activity to see all data.        Screening Tests Health Maintenance  Topic Date Due  . TETANUS/TDAP  04/10/2024 (Originally 07/31/1954)  . INFLUENZA VACCINE  05/12/2017  . PNA vac Low Risk Adult  Completed        Plan:    I have personally reviewed and addressed the Medicare Annual Wellness questionnaire and have noted the following in the patient's chart:  A. Medical and social history B. Use of alcohol, tobacco or illicit drugs  C. Current medications and supplements D. Functional ability and status E.  Nutritional status F.  Physical activity G. Advance directives H. List of other physicians I.  Hospitalizations, surgeries, and ER visits in previous 12 months J.  [place] to include hearing, vision, cognitive, depression L. Referrals and appointments - none  In addition, I have reviewed and discussed with patient certain  preventive protocols, quality metrics, and best practice recommendations. A written personalized care plan for preventive services as well as general preventive health recommendations were provided to patient.  See attached scanned questionnaire for additional information.   Signed,   Rich Reining, RN Nurse Health Advisor    I reviewed health advisors note, was available for consultation and agree with documentation and plan.  [name]. Harle Battiest  [facility] [phone] 8 am - 5 pm) [phone] (after hours)

## 2017-01-14 NOTE — Patient Instructions (Signed)
Christian Munoz , Thank you for taking time to come for your Medicare Wellness Visit. I appreciate your ongoing commitment to your health goals. Please review the following plan we discussed and let me know if I can assist you in the future.   Screening recommendations/referrals: Colonoscopy up to date Recommended yearly ophthalmology/optometry visit for glaucoma screening and checkup Recommended yearly dental visit for hygiene and checkup  Vaccinations: Influenza vaccine up to date Pneumococcal vaccine up to date Tdap vaccine due. Shingles vaccine up to date.    Advanced directives: In Chart  Conditions/risks identified: None  Next appointment: Christian Oats, NP 4/5 @ 12:45pm  Preventive Care 65 Years and Older, Male Preventive care refers to lifestyle choices and visits with your health care provider that can promote health and wellness. What does preventive care include?  A yearly physical exam. This is also called an annual well check.  Dental exams once or twice a year.  Routine eye exams. Ask your health care provider how often you should have your eyes checked.  Personal lifestyle choices, including:  Daily care of your teeth and gums.  Regular physical activity.  Eating a healthy diet.  Avoiding tobacco and drug use.  Limiting alcohol use.  Practicing safe sex.  Taking low doses of aspirin every day.  Taking vitamin and mineral supplements as recommended by your health care provider. What happens during an annual well check? The services and screenings done by your health care provider during your annual well check will depend on your age, overall health, lifestyle risk factors, and family history of disease. Counseling  Your health care provider may ask you questions about your:  Alcohol use.  Tobacco use.  Drug use.  Emotional well-being.  Home and relationship well-being.  Sexual activity.  Eating habits.  History of falls.  Memory and ability  to understand (cognition).  Work and work Statistician. Screening  You may have the following tests or measurements:  Height, weight, and BMI.  Blood pressure.  Lipid and cholesterol levels. These may be checked every 5 years, or more frequently if you are over 79 years old.  Skin check.  Lung cancer screening. You may have this screening every year starting at age 39 if you have a 30-pack-year history of smoking and currently smoke or have quit within the past 15 years.  Fecal occult blood test (FOBT) of the stool. You may have this test every year starting at age 72.  Flexible sigmoidoscopy or colonoscopy. You may have a sigmoidoscopy every 5 years or a colonoscopy every 10 years starting at age 61.  Prostate cancer screening. Recommendations will vary depending on your family history and other risks.  Hepatitis C blood test.  Hepatitis B blood test.  Sexually transmitted disease (STD) testing.  Diabetes screening. This is done by checking your blood sugar (glucose) after you have not eaten for a while (fasting). You may have this done every 1-3 years.  Abdominal aortic aneurysm (AAA) screening. You may need this if you are a current or former smoker.  Osteoporosis. You may be screened starting at age 52 if you are at high risk. Talk with your health care provider about your test results, treatment options, and if necessary, the need for more tests. Vaccines  Your health care provider may recommend certain vaccines, such as:  Influenza vaccine. This is recommended every year.  Tetanus, diphtheria, and acellular pertussis (Tdap, Td) vaccine. You may need a Td booster every 10 years.  Zoster vaccine. You may  need this after age 87.  Pneumococcal 13-valent conjugate (PCV13) vaccine. One dose is recommended after age 75.  Pneumococcal polysaccharide (PPSV23) vaccine. One dose is recommended after age 83. Talk to your health care provider about which screenings and vaccines  you need and how often you need them. This information is not intended to replace advice given to you by your health care provider. Make sure you discuss any questions you have with your health care provider. Document Released: 10/25/2015 Document Revised: 06/17/2016 Document Reviewed: 07/30/2015 Elsevier Interactive Patient Education  2017 [place] Prevention in the Home Falls can cause injuries. They can happen to people of all ages. There are many things you can do to make your home safe and to help prevent falls. What can I do on the outside of my home?  Regularly fix the edges of walkways and driveways and fix any cracks.  Remove anything that might make you trip as you walk through a door, such as a raised step or threshold.  Trim any bushes or trees on the path to your home.  Use bright outdoor lighting.  Clear any walking paths of anything that might make someone trip, such as rocks or tools.  Regularly check to see if handrails are loose or broken. Make sure that both sides of any steps have handrails.  Any raised decks and porches should have guardrails on the edges.  Have any leaves, snow, or ice cleared regularly.  Use sand or salt on walking paths during winter.  Clean up any spills in your garage right away. This includes oil or grease spills. What can I do in the bathroom?  Use night lights.  Install grab bars by the toilet and in the tub and shower. Do not use towel bars as grab bars.  Use non-skid mats or decals in the tub or shower.  If you need to sit down in the shower, use a plastic, non-slip stool.  Keep the floor dry. Clean up any water that spills on the floor as soon as it happens.  Remove soap buildup in the tub or shower regularly.  Attach bath mats securely with double-sided non-slip rug tape.  Do not have throw rugs and other things on the floor that can make you trip. What can I do in the bedroom?  Use night lights.  Make sure  that you have a light by your bed that is easy to reach.  Do not use any sheets or blankets that are too big for your bed. They should not hang down onto the floor.  Have a firm chair that has side arms. You can use this for support while you get dressed.  Do not have throw rugs and other things on the floor that can make you trip. What can I do in the kitchen?  Clean up any spills right away.  Avoid walking on wet floors.  Keep items that you use a lot in easy-to-reach places.  If you need to reach something above you, use a strong step stool that has a grab bar.  Keep electrical cords out of the way.  Do not use floor polish or wax that makes floors slippery. If you must use wax, use non-skid floor wax.  Do not have throw rugs and other things on the floor that can make you trip. What can I do with my stairs?  Do not leave any items on the stairs.  Make sure that there are handrails on both sides  of the stairs and use them. Fix handrails that are broken or loose. Make sure that handrails are as long as the stairways.  Check any carpeting to make sure that it is firmly attached to the stairs. Fix any carpet that is loose or worn.  Avoid having throw rugs at the top or bottom of the stairs. If you do have throw rugs, attach them to the floor with carpet tape.  Make sure that you have a light switch at the top of the stairs and the bottom of the stairs. If you do not have them, ask someone to add them for you. What else can I do to help prevent falls?  Wear shoes that:  Do not have high heels.  Have rubber bottoms.  Are comfortable and fit you well.  Are closed at the toe. Do not wear sandals.  If you use a stepladder:  Make sure that it is fully opened. Do not climb a closed stepladder.  Make sure that both sides of the stepladder are locked into place.  Ask someone to hold it for you, if possible.  Clearly mark and make sure that you can see:  Any grab bars or  handrails.  First and last steps.  Where the edge of each step is.  Use tools that help you move around (mobility aids) if they are needed. These include:  Canes.  Walkers.  Scooters.  Crutches.  Turn on the lights when you go into a dark area. Replace any light bulbs as soon as they burn out.  Set up your furniture so you have a clear path. Avoid moving your furniture around.  If any of your floors are uneven, fix them.  If there are any pets around you, be aware of where they are.  Review your medicines with your doctor. Some medicines can make you feel dizzy. This can increase your chance of falling. Ask your doctor what other things that you can do to help prevent falls. This information is not intended to replace advice given to you by your health care provider. Make sure you discuss any questions you have with your health care provider. Document Released: 07/25/2009 Document Revised: 03/05/2016 Document Reviewed: 11/02/2014 Elsevier Interactive Patient Education  2017 [employer].

## 2017-01-14 NOTE — Progress Notes (Signed)
Quick Notes   Health Maintenance: TDAP due, pt declined.     Abnormal Screen: MMSE 26/30. Couldn't complete drawings and clock due to legally blind     Patient Concerns: Metoprolol dose change?     Nurse Concerns: None

## 2017-01-14 NOTE — Progress Notes (Signed)
Provider: [name], NP  Patient Care Team: Christian Chandler, NP as PCP - General (Nurse Practitioner) Christian Munoz, OD as Referring Physician (Optometry)  Extended Emergency Contact Information Primary Emergency Contact: Christian Munoz Address: [address] [place] Phone: [phone] Relation: Son Allergies  Allergen Reactions  . Chantix [Varenicline] Other (See Comments)    Makes patient suicidal  . Zoloft [Sertraline Hcl] Other (See Comments)    Makes patient suicidal   Code Status: DNR Goals of Care: Advanced Directive information Advanced Directives 01/14/2017  Does Patient Have a Medical Advance Directive? Yes  Type of Advance Directive [place]  Does patient want to make changes to medical advance directive? -  Copy of [place] in Chart? -  Some encounter information is confidential and restricted. Go to Review Flowsheets activity to see all data.     Chief Complaint  Patient presents with  . Medical Management of Chronic Issues    Pt is being seen for extended visit. Pt is concerned with dose of metoprolol may be causing BP to drop too low.   . Other    Pt does not want cologuard or colonoscopy at this time.     HPI: Patient is a 82 y.o. male seen in today for an annual wellness exam.   Vision has gotten much worse since closure of macular hole.  diagnosed with small cell lung cancer, following with oncologist getting chemo and radiation. Having fatigue and nausea due to treatment.  Waking up with night sweats. Had fever of unknown origin (possible chemo) but none in the last couple of days.   Depression screen [facility] 2/9 01/14/2017 12/24/2016 10/22/2016 03/30/2016 02/28/2016  Decreased Interest 0 0 0 0 0  Down, Depressed, Hopeless 3 0 0 0 0  PHQ - 2 Score 3 0 0 0 0  Altered sleeping - - - - -  Tired, decreased energy - - - - -  Change in appetite - - - - -  Feeling bad  or failure about yourself  - - - - -  Trouble concentrating - - - - -  Moving slowly or fidgety/restless - - - - -  Suicidal thoughts - - - - -  PHQ-9 Score - - - - -  following with psychiatry, mood has actually been better since he has been better since diagnosed with cancer. seroquel has helped.   Fall Risk  01/14/2017 01/14/2017 12/24/2016 10/27/2016 10/22/2016  Falls in the past year? No No No No No  Number falls in past yr: - - - - -  Injury with Fall? - - - - -  Risk Factor Category  - - - - -  Risk for fall due to : - - History of fall(s) - -  Follow up - - - - -   MMSE - Mini Mental State Exam 01/14/2017 08/09/2014  Orientation to time 5 5  Orientation to Place 5 4  Registration 3 3  Attention/ Calculation 5 5  Recall 3 3  Language- name 2 objects 0 2  Language- repeat 1 1  Language- follow 3 step command 3 3  Language- read & follow direction 1 1  Write a sentence 0 1  Copy design 0 1  Total score 26 29  Some encounter information is confidential and restricted. Go to Review Flowsheets activity to see all data.     Health  Maintenance  Topic Date Due  . TETANUS/TDAP  04/10/2024 (Originally 07/31/1954)  . INFLUENZA VACCINE  05/12/2017  . PNA vac Low Risk Adult  Completed    Diet? Lack of appetite due to chemo exercise? None due to lack of energy from chemo and radiation.   Dentition: no dental work until after treatment Pain:none.   Past Medical History:  Diagnosis Date  . Allergic rhinitis due to pollen   . Anginal pain ([place])    Past medical history " does not seem to be a problem at all now."  . Coronary atherosclerosis of native coronary artery   . Coronary atherosclerosis of native coronary artery   . Depressive disorder, not elsewhere classified   . Encounter for antineoplastic chemotherapy 11/30/2016  . GERD (gastroesophageal reflux disease)   . Goals of care, counseling/discussion 11/30/2016  . Headache    PMH: Migraines  . Herpes genitalia   .  Hypertrophy of prostate without urinary obstruction and other lower urinary tract symptoms (LUTS)   . Hypertrophy of prostate without urinary obstruction and other lower urinary tract symptoms (LUTS)   . Lack of coordination   . Lack of coordination   . Lumbago   . Nausea and vomiting 11/30/2016  . Osteoarthrosis, unspecified whether generalized or localized, unspecified site   . Osteoarthrosis, unspecified whether generalized or localized, unspecified site   . Other and unspecified hyperlipidemia   . Other malaise and fatigue   . Pneumonia   . Reflux esophagitis   . Skin cancer   . Sleep apnea    does not wear CPAP  . Syncope and collapse   . Urinary frequency   . Vitamin D deficiency     Past Surgical History:  Procedure Laterality Date  . CARDIAC CATHETERIZATION    . COLONOSCOPY    . EYE SURGERY    . IR GENERIC HISTORICAL  12/04/2016   IR US GUIDE VASC ACCESS RIGHT 12/04/2016 Christian Keen, MD [facility]  . IR GENERIC HISTORICAL  12/04/2016   IR FLUORO GUIDE PORT INSERTION RIGHT 12/04/2016 Christian Keen, MD [facility]  . poly vocal cords  1985  . SKIN CANCER EXCISION  2011  . TONSILLECTOMY    . VASECTOMY    . VIDEO BRONCHOSCOPY WITH ENDOBRONCHIAL ULTRASOUND N/A 11/20/2016   Procedure: VIDEO BRONCHOSCOPY WITH ENDOBRONCHIAL ULTRASOUND;  Surgeon: Christian Gobble, MD;  Location: [facility];  Service: Thoracic;  Laterality: N/A;    Social History   Social History  . Marital status: Divorced    Spouse name: N/A  . Number of children: N/A  . Years of education: N/A   Social History Main Topics  . Smoking status: Former Smoker    Packs/day: 1.00    Years: 30.00    Types: Cigarettes    Quit date: 08/12/2016  . Smokeless tobacco: Never Used     Comment: 3 per day  . Alcohol use No  . Drug use: No  . Sexual activity: Not Currently   Other Topics Concern  . None   Social History Narrative  . None    Family History  Problem Relation Age of Onset  . Heart disease Father    . Cancer Father   . Cancer Brother   . Cancer Brother     Review of Systems:  Review of Systems  Constitutional: Negative for activity change, appetite change, fatigue and unexpected weight change.  HENT: Negative for congestion and hearing loss.   Eyes: Negative.   Respiratory: Negative for cough  and shortness of breath.   Cardiovascular: Negative for chest pain, palpitations and leg swelling.  Gastrointestinal: Negative for abdominal pain, constipation and diarrhea.       Hemorrhoids, prolapse rectum-- no current issues  Endocrine:       Night sweats   Genitourinary: Positive for frequency. Negative for difficulty urinating and dysuria.  Musculoskeletal: Negative for arthralgias and myalgias.  Skin: Negative for color change and wound.  Neurological: Negative for dizziness and weakness.  Psychiatric/Behavioral: Positive for dysphoric mood and sleep disturbance. Negative for agitation, behavioral problems and confusion.       Depression     Allergies as of 01/14/2017      Reactions   Chantix [varenicline] Other (See Comments)   Makes patient suicidal   Zoloft [sertraline Hcl] Other (See Comments)   Makes patient suicidal      Medication List       Accurate as of 01/14/17  1:25 PM. Always use your most recent med list.          aspirin 81 MG tablet Take 81 mg by mouth daily. Take one tablet once a day   escitalopram 5 MG tablet Commonly known as:  LEXAPRO TAKE 1 TABLET(5 MG) BY MOUTH EVERY MORNING   fish oil-omega-3 fatty acids 1000 MG capsule Take 2 g by mouth daily. Take one tablet twice a day   metoprolol tartrate 25 MG tablet Commonly known as:  LOPRESSOR TAKE 1 TABLET BY MOUTH TWICE DAILY   multivitamin with minerals tablet Take 1 tablet by mouth daily.   pantoprazole 20 MG tablet Commonly known as:  PROTONIX TAKE 1 TABLET(20 MG) BY MOUTH DAILY   prochlorperazine 10 MG tablet Commonly known as:  COMPAZINE Take 1 tablet (10 mg total) by mouth every 6  (six) hours as needed for nausea or vomiting.   QUEtiapine 25 MG tablet Commonly known as:  SEROQUEL Take 1 tablet (25 mg total) by mouth at bedtime. 1/2- 1 pill po qhs   simvastatin 20 MG tablet Commonly known as:  ZOCOR TAKE 1 TABLET(20 MG) BY MOUTH DAILY   tamsulosin 0.4 MG Caps capsule Commonly known as:  FLOMAX TAKE 1 CAPSULE(0.4 MG) BY MOUTH DAILY         Physical Exam: Vitals:   01/14/17 1313  BP: 132/74  Pulse: 84  Resp: 18  Temp: 97.9 F (36.6 C)  SpO2: 97%  Weight: 159 lb (72.1 kg)  Height: '5\' 10"'$  (1.778 m)   Body mass index is 22.81 kg/m. Physical Exam  Constitutional: He is oriented to person, place, and time. No distress.  8 lb weight loss since 07/2016  HENT:  Head: Normocephalic and atraumatic.  Right Ear: External ear normal.  Left Ear: External ear normal.  Nose: Nose normal.  Mouth/Throat: Oropharynx is clear and moist. No oropharyngeal exudate.  Eyes: Conjunctivae and EOM are normal. Pupils are equal, round, and reactive to light.  Neck: Normal range of motion. Neck supple. No thyromegaly present.  Cardiovascular: Normal rate, regular rhythm and normal heart sounds.   Pulmonary/Chest: Effort normal and breath sounds normal. No respiratory distress. He has no wheezes. He has no rales. He exhibits no tenderness.  Abdominal: Soft. Bowel sounds are normal.  Musculoskeletal: Normal range of motion.  Walking with walking stick now, requires guidance  Neurological: He is alert and oriented to person, place, and time.  Skin: Skin is warm and dry. There is pallor.  Psychiatric: He has a normal mood and affect.    Labs reviewed:  Basic Metabolic Panel:  Recent Labs  07/13/16 1407 10/22/16 1601  10/23/16 1523 11/20/16 0800  12/04/16 0814  01/04/17 1115 01/11/17 1205 01/12/17 1042  NA 139  --   --  134* 140  < > 141  < > 139 139 141  K 4.3  --   --  3.7 3.9  < > 4.2  < > 4.5 4.6 3.8  CL 103  --   --  98* 105  --  105  --   --   --   --   CO2  26  --   --  24 27  < > 28  < > '29 28 26  '$ GLUCOSE 97  --   --  168* 104*  < > 105*  < > 115 113 124  BUN 21  --   --  11 14  < > 16  < > 18.2 12.2 13.0  CREATININE 1.10  --   < > 1.13 1.25*  < > 1.10  < > 1.0 1.1 1.2  CALCIUM 9.5  --   --  8.7* 8.7*  < > 9.2  < > 9.2 9.6 9.6  TSH 2.62 2.20  --   --   --   --   --   --   --   --   --   < > = values in this interval not displayed. Liver Function Tests:  Recent Labs  01/04/17 1115 01/11/17 1205 01/12/17 1042  AST '12 21 17  '$ ALT '10 29 25  '$ ALKPHOS 107 96 103  BILITOT 0.47 0.40 0.43  PROT 6.5 6.8 7.1  ALBUMIN 3.2* 3.0* 3.1*   No results for input(s): LIPASE, AMYLASE in the last 8760 hours. No results for input(s): AMMONIA in the last 8760 hours. CBC:  Recent Labs  01/04/17 1115 01/11/17 1205 01/12/17 1042  WBC 16.1* 10.5* 10.1  NEUTROABS 12.5* 7.4* 7.0*  HGB 10.4* 10.2* 10.9*  HCT 33.0* 32.8* 34.6*  MCV 85.7 85.2 85.0  PLT 224 108* 134*   Lipid Panel: No results for input(s): CHOL, HDL, LDLCALC, TRIG, CHOLHDL, LDLDIRECT in the last 8760 hours. Lab Results  Component Value Date   HGBA1C 5.7 (H) 07/13/2016    Procedures: No results found.  Assessment/Plan 1. Wellness examination -pt does not wish to have screening for colorectal cancer at this time, declines rectal exam, cologuard or GI referral. Exercise is limited due to fatigue with cancer treatment. Diet has been liberalized due to weight loss from anorexia - DNR (Do Not Resuscitate)  2. Essential hypertension Blood pressure has been low, will decrease lopressor to 1/2 tablet BID - metoprolol tartrate (LOPRESSOR) 25 MG tablet; Take 0.5 tablets (12.5 mg total) by mouth 2 (two) times daily.  Dispense: 90 tablet; Refill: 1  3. Moderate episode of recurrent major depressive disorder ([place]) -pt has been very positive throughout cancer diagnosis and treatment. Depression is stable at this time. Encouraged pt to cont to see psychiatry at this time. conts on seroquel and  lexapro.  4. Hyperlipidemia, unspecified hyperlipidemia type -on zocor, to get fasting lipids with next lab draw  5. Reflux esophagitis Stable on protonix 20 mg daily   Follow up in 4 months, sooner as needed Bellamarie Pflug K. Harle Battiest  [facility] [phone] 8 am - 5 pm) [phone] (after hours)

## 2017-01-15 ENCOUNTER — Other Ambulatory Visit: Payer: Self-pay | Admitting: Nurse Practitioner

## 2017-01-15 ENCOUNTER — Telehealth: Payer: Self-pay | Admitting: Nurse Practitioner

## 2017-01-15 ENCOUNTER — Ambulatory Visit
Admission: RE | Admit: 2017-01-15 | Discharge: 2017-01-15 | Disposition: A | Discharge status: Home / Self Care | Payer: Medicare Other | Source: Ambulatory Visit | Attending: Radiation Oncology | Admitting: Radiation Oncology

## 2017-01-15 VITALS — BP 125/73 | HR 74 | Temp 98.2°F | Resp 18 | Wt 160.6 lb

## 2017-01-15 DIAGNOSIS — Z510 Encounter for antineoplastic radiation therapy: Secondary | ICD-10-CM | POA: Diagnosis not present

## 2017-01-15 DIAGNOSIS — N390 Urinary tract infection, site not specified: Secondary | ICD-10-CM

## 2017-01-15 DIAGNOSIS — C3492 Malignant neoplasm of unspecified part of left bronchus or lung: Secondary | ICD-10-CM

## 2017-01-15 MED ORDER — CIPROFLOXACIN HCL 500 MG PO TABS: 500.0000 mg | ORAL_TABLET | Freq: Two times a day (BID) | ORAL | 0 refills | Status: DC

## 2017-01-15 NOTE — Progress Notes (Signed)
  Radiation Oncology         ([phone]) [phone] ________________________________  Name: Christian Munoz MRN: [id number]  Date: 01/15/2017  DOB: [birth year]    Weekly Radiation Therapy Management    ICD-9-CM ICD-10-CM   1. Small cell lung cancer, left (HCC) 162.9 C34.92      Current Dose: 12.6 Gy     Planned Dose:  59.4 Gy  Narrative . . . . . . . . The patient presents for routine under treatment assessment.                                 Weight and vitals stable. Denies pain. Reports increased frequency of productive cough. Denies shortness of breath. Reports new onset hoarseness. Denies pain or difficulty associated with swallowing. Reports increased fatigue. Reports night sweats that began with radiation therapy. Reports his temperature at home last night was 99.6. Patient is afebrile today. Reports continued use of Flomax. Reports PCP decreased his lopressor to half a tablet. He is receiving concurrent chemotherapy.                                  Set-up films were reviewed.                                 The chart was checked. Physical Findings. . .  weight is 160 lb 9.6 oz (72.8 kg). His oral temperature is 98.2 F (36.8 C). His blood pressure is 125/73 and his pulse is 74. His respiration is 18 and oxygen saturation is 100%. . Weight essentially stable.  No significant changes. Lungs are clear to auscultation bilaterally. Heart has regular rate and rhythm. Impression . . . . . . . The patient is tolerating radiation. Plan . . . . . . . . . . . . Continue treatment as planned.  ________________________________   Blair Promise, PhD, MD  This document serves as a record of services personally performed by Gery Pray, MD. It was created on his behalf by Arlyce Harman, a trained medical scribe. The creation of this record is based on the scribe's personal observations and the provider's statements to them. This document has been checked and approved by the attending  provider.

## 2017-01-15 NOTE — Progress Notes (Signed)
Weight and vitals stable. Denies pain. Reports increased frequency of productive cough. Denies shortness of breath. Reports new onset hoarseness. Denies pain or difficulty associated with swallowing. Reports increased fatigue. Reports night sweats that began with radiation therapy. Reports his temperature at home last night was 99.6. Patient is afebrile today. Reports continued use of Flomax. Reports PCP decreased his lopressor to half a tablet.   BP 125/73 (BP Location: Right Arm, Patient Position: Sitting, Cuff Size: Normal)   Pulse 74   Temp 98.2 F (36.8 C) (Oral)   Resp 18   Wt 160 lb 9.6 oz (72.8 kg)   SpO2 100%   BMI 23.04 kg/m  Wt Readings from Last 3 Encounters:  01/15/17 160 lb 9.6 oz (72.8 kg)  01/14/17 159 lb (72.1 kg)  01/14/17 159 lb (72.1 kg)

## 2017-01-15 NOTE — Telephone Encounter (Signed)
A patient presented to the [facility] earlier this past week with complaints of feeling some aches and questionable UTI.  Urinalysis obtained revealed no UTI.  However, urine culture resulted positive Klebsiella.  There is no sensitivity as of yet.  This provider called and it was determined that the urine culture was sent out to [facility] labs; instead of the [facility].  Unable to obtain the urine culture results.  At this time.  Reviewed all findings with Dr. Benay Spice; he recommended prescribing the patient Cipro antibiotics for treatment of any UTI symptoms.  Also, this provider called and spoke to patient's son to review all of these details.  Patient was encouraged to directly to the emergency department of the weekend for any worsening symptoms whatsoever.

## 2017-01-16 LAB — URINE CULTURE

## 2017-01-18 ENCOUNTER — Ambulatory Visit: Payer: Medicare Other

## 2017-01-18 ENCOUNTER — Encounter: Payer: Self-pay | Admitting: Internal Medicine

## 2017-01-18 ENCOUNTER — Ambulatory Visit (HOSPITAL_BASED_OUTPATIENT_CLINIC_OR_DEPARTMENT_OTHER): Payer: Medicare Other

## 2017-01-18 ENCOUNTER — Ambulatory Visit (HOSPITAL_BASED_OUTPATIENT_CLINIC_OR_DEPARTMENT_OTHER)
Admission: RE | Admit: 2017-01-18 | Discharge: 2017-01-18 | Disposition: A | Discharge status: Home / Self Care | Payer: Medicare Other | Source: Ambulatory Visit | Attending: Radiation Oncology | Admitting: Radiation Oncology

## 2017-01-18 ENCOUNTER — Other Ambulatory Visit: Payer: Medicare Other

## 2017-01-18 ENCOUNTER — Telehealth: Payer: Self-pay | Admitting: Internal Medicine

## 2017-01-18 ENCOUNTER — Ambulatory Visit (HOSPITAL_BASED_OUTPATIENT_CLINIC_OR_DEPARTMENT_OTHER): Payer: Medicare Other | Admitting: Internal Medicine

## 2017-01-18 VITALS — BP 129/74 | HR 72 | Temp 98.2°F | Resp 18 | Ht 70.0 in | Wt 158.5 lb

## 2017-01-18 DIAGNOSIS — R300 Dysuria: Secondary | ICD-10-CM | POA: Diagnosis not present

## 2017-01-18 DIAGNOSIS — Z510 Encounter for antineoplastic radiation therapy: Secondary | ICD-10-CM | POA: Diagnosis not present

## 2017-01-18 DIAGNOSIS — I10 Essential (primary) hypertension: Secondary | ICD-10-CM | POA: Diagnosis not present

## 2017-01-18 DIAGNOSIS — R509 Fever, unspecified: Secondary | ICD-10-CM

## 2017-01-18 DIAGNOSIS — H539 Unspecified visual disturbance: Secondary | ICD-10-CM | POA: Diagnosis not present

## 2017-01-18 DIAGNOSIS — C3432 Malignant neoplasm of lower lobe, left bronchus or lung: Secondary | ICD-10-CM

## 2017-01-18 DIAGNOSIS — Z5111 Encounter for antineoplastic chemotherapy: Secondary | ICD-10-CM | POA: Diagnosis present

## 2017-01-18 DIAGNOSIS — C3492 Malignant neoplasm of unspecified part of left bronchus or lung: Secondary | ICD-10-CM

## 2017-01-18 DIAGNOSIS — Z95828 Presence of other vascular implants and grafts: Secondary | ICD-10-CM

## 2017-01-18 HISTORY — DX: Dysuria: R30.0

## 2017-01-18 LAB — CBC WITH DIFFERENTIAL/PLATELET
BASO%: 0.1 % (ref 0.0–2.0)
Basophils Absolute: 0 10*3/uL (ref 0.0–0.1)
EOS ABS: 0 10*3/uL (ref 0.0–0.5)
EOS%: 0.1 % (ref 0.0–7.0)
HEMATOCRIT: 30.4 % — AB (ref 38.4–49.9)
HEMOGLOBIN: 9.5 g/dL — AB (ref 13.0–17.1)
LYMPH#: 1.7 10*3/uL (ref 0.9–3.3)
LYMPH%: 17.8 % (ref 14.0–49.0)
MCH: 26.8 pg — ABNORMAL LOW (ref 27.2–33.4)
MCHC: 31.3 g/dL — ABNORMAL LOW (ref 32.0–36.0)
MCV: 85.9 fL (ref 79.3–98.0)
MONO#: 0.7 10*3/uL (ref 0.1–0.9)
MONO%: 7.2 % (ref 0.0–14.0)
NEUT%: 74.8 % (ref 39.0–75.0)
NEUTROS ABS: 7 10*3/uL — AB (ref 1.5–6.5)
PLATELETS: 324 10*3/uL (ref 140–400)
RBC: 3.54 10*6/uL — ABNORMAL LOW (ref 4.20–5.82)
RDW: 17.9 % — AB (ref 11.0–14.6)
WBC: 9.4 10*3/uL (ref 4.0–10.3)

## 2017-01-18 LAB — COMPREHENSIVE METABOLIC PANEL
ALBUMIN: 2.9 g/dL — AB (ref 3.5–5.0)
ALT: 14 U/L (ref 0–55)
ANION GAP: 10 meq/L (ref 3–11)
AST: 15 U/L (ref 5–34)
Alkaline Phosphatase: 78 U/L (ref 40–150)
BILIRUBIN TOTAL: 0.34 mg/dL (ref 0.20–1.20)
BUN: 11.8 mg/dL (ref 7.0–26.0)
CO2: 25 mEq/L (ref 22–29)
CREATININE: 1 mg/dL (ref 0.7–1.3)
Calcium: 9.1 mg/dL (ref 8.4–10.4)
Chloride: 105 mEq/L (ref 98–109)
EGFR: 74 mL/min/{1.73_m2} — AB (ref >90)
Glucose: 107 mg/dl (ref 70–140)
Potassium: 4.2 mEq/L (ref 3.5–5.1)
Sodium: 140 mEq/L (ref 136–145)
TOTAL PROTEIN: 6.6 g/dL (ref 6.4–8.3)

## 2017-01-18 LAB — CULTURE, BLOOD (SINGLE)

## 2017-01-18 MED ORDER — DEXAMETHASONE SODIUM PHOSPHATE 10 MG/ML IJ SOLN
INTRAMUSCULAR | Status: AC
  Filled 2017-01-18: qty 1

## 2017-01-18 MED ORDER — PALONOSETRON HCL INJECTION 0.25 MG/5ML
INTRAVENOUS | Status: AC
  Filled 2017-01-18: qty 5

## 2017-01-18 MED ORDER — SODIUM CHLORIDE 0.9 % IV SOLN
402.5000 mg | Freq: Once | INTRAVENOUS | Status: AC
  Administered 2017-01-18: 400 mg via INTRAVENOUS
  Filled 2017-01-18: qty 40

## 2017-01-18 MED ORDER — SODIUM CHLORIDE 0.9 % IV SOLN
100.0000 mg/m2 | Freq: Once | INTRAVENOUS | Status: AC
  Administered 2017-01-18: 190 mg via INTRAVENOUS
  Filled 2017-01-18: qty 9.5

## 2017-01-18 MED ORDER — DEXAMETHASONE SODIUM PHOSPHATE 10 MG/ML IJ SOLN
10.0000 mg | Freq: Once | INTRAMUSCULAR | Status: AC
  Administered 2017-01-18: 10 mg via INTRAVENOUS

## 2017-01-18 MED ORDER — SODIUM CHLORIDE 0.9% FLUSH
10.0000 mL | INTRAVENOUS | Status: DC | PRN
  Administered 2017-01-18: 10 mL via INTRAVENOUS
  Filled 2017-01-18: qty 10

## 2017-01-18 MED ORDER — SODIUM CHLORIDE 0.9% FLUSH
10.0000 mL | INTRAVENOUS | Status: DC | PRN
  Administered 2017-01-18: 10 mL
  Filled 2017-01-18: qty 10

## 2017-01-18 MED ORDER — PALONOSETRON HCL INJECTION 0.25 MG/5ML
0.2500 mg | Freq: Once | INTRAVENOUS | Status: AC
  Administered 2017-01-18: 0.25 mg via INTRAVENOUS

## 2017-01-18 MED ORDER — SODIUM CHLORIDE 0.9 % IV SOLN
Freq: Once | INTRAVENOUS | Status: AC
  Administered 2017-01-18: 12:00:00 via INTRAVENOUS

## 2017-01-18 MED ORDER — HEPARIN SOD (PORK) LOCK FLUSH 100 UNIT/ML IV SOLN
500.0000 [IU] | Freq: Once | INTRAVENOUS | Status: AC | PRN
  Administered 2017-01-18: 500 [IU]
  Filled 2017-01-18: qty 5

## 2017-01-18 NOTE — Progress Notes (Signed)
[facility] [phone]   [phone]  OFFICE PROGRESS NOTE  [name], NP [place] [address]  DIAGNOSIS: Limited stage, Stage IIIA (T3, N2, M0) small cell lung cancer diagnosed in February 2018 and presented with left lower lobe lung mass in addition to subcarinal lymphadenopathy.  PRIOR THERAPY: None  CURRENT THERAPY: Systemic chemotherapy with carboplatin for AUC of 5 on day 1 and etoposide 100 MG/M2 on days 1, 2 and 3 with Neulasta support. This will be concurrent with radiotherapy. Status post 2 cycles.  INTERVAL HISTORY: Christian Munoz 82 y.o. male returns to the clinic today for follow-up visit accompanied by his son. The patient tolerated the last cycle of his systemic chemotherapy with carboplatin and etoposide fairly well. He denied having any significant nausea or vomiting. He denied having any chest pain, shortness of breath, cough or hemoptysis. He has some visual changes and he is followed by his ophthalmologist for addressing this issue. He also had questionable urinary tract infection and low-grade fever last week but this is significantly improved. He had repeat CT scan of the chest performed recently and he is here for evaluation and discussion of his scan results and treatment options.   MEDICAL HISTORY: Past Medical History:  Diagnosis Date  . Allergic rhinitis due to pollen   . Anginal pain ([place])    Past medical history " does not seem to be a problem at all now."  . Coronary atherosclerosis of native coronary artery   . Coronary atherosclerosis of native coronary artery   . Depressive disorder, not elsewhere classified   . Encounter for antineoplastic chemotherapy 11/30/2016  . GERD (gastroesophageal reflux disease)   . Goals of care, counseling/discussion 11/30/2016  . Headache    PMH: Migraines  . Herpes genitalia   . Hypertrophy of prostate without urinary obstruction and other lower  urinary tract symptoms (LUTS)   . Hypertrophy of prostate without urinary obstruction and other lower urinary tract symptoms (LUTS)   . Lack of coordination   . Lack of coordination   . Lumbago   . Nausea and vomiting 11/30/2016  . Osteoarthrosis, unspecified whether generalized or localized, unspecified site   . Osteoarthrosis, unspecified whether generalized or localized, unspecified site   . Other and unspecified hyperlipidemia   . Other malaise and fatigue   . Pneumonia   . Reflux esophagitis   . Skin cancer   . Sleep apnea    does not wear CPAP  . Syncope and collapse   . Urinary frequency   . Vitamin D deficiency     ALLERGIES:  is allergic to chantix [varenicline] and zoloft [sertraline hcl].  MEDICATIONS:  Current Outpatient Prescriptions  Medication Sig Dispense Refill  . aspirin 81 MG tablet Take 81 mg by mouth daily. Take one tablet once a day    . ciprofloxacin (CIPRO) 500 MG tablet Take 1 tablet (500 mg total) by mouth 2 (two) times daily. 10 tablet 0  . escitalopram (LEXAPRO) 5 MG tablet TAKE 1 TABLET(5 MG) BY MOUTH EVERY MORNING 30 tablet 2  . fish oil-omega-3 fatty acids 1000 MG capsule Take 2 g by mouth daily. Take one tablet twice a day    . metoprolol tartrate (LOPRESSOR) 25 MG tablet Take 0.5 tablets (12.5 mg total) by mouth 2 (two) times daily. 90 tablet 1  . Multiple Vitamins-Minerals (MULTIVITAMIN WITH MINERALS) tablet Take 1 tablet by mouth daily.    . pantoprazole (PROTONIX) 20  MG tablet TAKE 1 TABLET(20 MG) BY MOUTH DAILY 90 tablet 1  . prochlorperazine (COMPAZINE) 10 MG tablet Take 1 tablet (10 mg total) by mouth every 6 (six) hours as needed for nausea or vomiting. 30 tablet 0  . QUEtiapine (SEROQUEL) 25 MG tablet Take 1 tablet (25 mg total) by mouth at bedtime. 1/2- 1 pill po qhs 30 tablet 2  . simvastatin (ZOCOR) 20 MG tablet TAKE 1 TABLET(20 MG) BY MOUTH DAILY 90 tablet 2  . tamsulosin (FLOMAX) 0.4 MG CAPS capsule TAKE 1 CAPSULE(0.4 MG) BY MOUTH DAILY  90 capsule 0   No current facility-administered medications for this visit.     SURGICAL HISTORY:  Past Surgical History:  Procedure Laterality Date  . CARDIAC CATHETERIZATION    . COLONOSCOPY    . EYE SURGERY    . IR GENERIC HISTORICAL  12/04/2016   IR US GUIDE VASC ACCESS RIGHT 12/04/2016 Greggory Keen, MD [facility]  . IR GENERIC HISTORICAL  12/04/2016   IR FLUORO GUIDE PORT INSERTION RIGHT 12/04/2016 Greggory Keen, MD [facility]  . poly vocal cords  1985  . SKIN CANCER EXCISION  2011  . TONSILLECTOMY    . VASECTOMY    . VIDEO BRONCHOSCOPY WITH ENDOBRONCHIAL ULTRASOUND N/A 11/20/2016   Procedure: VIDEO BRONCHOSCOPY WITH ENDOBRONCHIAL ULTRASOUND;  Surgeon: Collene Gobble, MD;  Location: [facility];  Service: Thoracic;  Laterality: N/A;    REVIEW OF SYSTEMS:  Constitutional: positive for fatigue Eyes: negative Ears, nose, mouth, throat, and face: negative Respiratory: positive for dyspnea on exertion Cardiovascular: negative Gastrointestinal: negative Genitourinary:negative Integument/breast: negative Hematologic/lymphatic: negative Musculoskeletal:negative Neurological: negative Behavioral/Psych: negative Endocrine: negative Allergic/Immunologic: negative   PHYSICAL EXAMINATION: General appearance: alert, cooperative, fatigued and no distress Head: Normocephalic, without obvious abnormality, atraumatic Neck: no adenopathy, no JVD, supple, symmetrical, trachea midline and thyroid not enlarged, symmetric, no tenderness/mass/nodules Lymph nodes: Cervical, supraclavicular, and axillary nodes normal. Resp: clear to auscultation bilaterally Back: symmetric, no curvature. ROM normal. No CVA tenderness. Cardio: regular rate and rhythm, S1, S2 normal, no murmur, click, rub or gallop GI: soft, non-tender; bowel sounds normal; no masses,  no organomegaly Extremities: extremities normal, atraumatic, no cyanosis or edema Neurologic: Alert and oriented X 3, normal strength and tone.  Normal symmetric reflexes. Normal coordination and gait  ECOG PERFORMANCE STATUS: 1 - Symptomatic but completely ambulatory  Blood pressure 129/74, pulse 72, temperature 98.2 F (36.8 C), temperature source Oral, resp. rate 18, height '5\' 10"'$  (1.778 m), weight 158 lb 8 oz (71.9 kg), SpO2 97 %.  LABORATORY DATA: Lab Results  Component Value Date   WBC 9.4 01/18/2017   HGB 9.5 (L) 01/18/2017   HCT 30.4 (L) 01/18/2017   MCV 85.9 01/18/2017   PLT 324 01/18/2017      Chemistry      Component Value Date/Time   NA 140 01/18/2017 1010   K 4.2 01/18/2017 1010   CL 105 12/04/2016 0814   CO2 25 01/18/2017 1010   BUN 11.8 01/18/2017 1010   CREATININE 1.0 01/18/2017 1010      Component Value Date/Time   CALCIUM 9.1 01/18/2017 1010   ALKPHOS 78 01/18/2017 1010   AST 15 01/18/2017 1010   ALT 14 01/18/2017 1010   BILITOT 0.34 01/18/2017 1010       RADIOGRAPHIC STUDIES: Ct Chest W Contrast  Result Date: 01/14/2017 CLINICAL DATA:  Left side small-cell lung cancer. EXAM: CT CHEST WITH CONTRAST TECHNIQUE: Multidetector CT imaging of the chest was performed during intravenous contrast administration. CONTRAST:  34m ISOVUE-300 IOPAMIDOL (ISOVUE-300) INJECTION 61% COMPARISON:  PET-CT 12/03/2016.  Chest CT 10/23/2016. FINDINGS: Cardiovascular: The heart size is normal. No pericardial effusion. Coronary artery calcification is noted. Right Port-A-Cath tip is positioned at the distal SVC near the junction with the RA. Mediastinum/Nodes: 6 mm short axis height retrotracheal lymph node not substantially changed since PET-CT. Tiny prevascular an AP window lymph nodes appear stable. Hypermetabolic subcarinal lymph nodes seen on the previous PET-CT are stable in size and not enlarged by CT criteria. Index 7 mm subcarinal lymph node is identified on image 74 series 2. No right hilar lymphadenopathy. Abnormal soft tissue is identified in the left hilum, mainly in the inferior aspect and infrahilar left lung.  The esophagus has normal imaging features. Lungs/Pleura: Centrilobular and paraseptal emphysema noted bilaterally. Compressive atelectasis noted in the dependent right lower lobe. The confluent left infrahilar opacity has decreased in the interval measuring approximately 4.7 x 5.6 cm today compared to 7.4 x 7.0 cm at the same location when I remeasure it on the PET-CT. While the main infrahilar lesion appears decreased and mass-effect on the posterior left lower lobe airways has improved, there is interval progression of posterior left lower lobe consolidation. Tiny left pleural effusion again noted. Upper Abdomen: Multiple cysts are again identified in the liver measuring up to 2.9 cm Musculoskeletal: Bone windows reveal no worrisome lytic or sclerotic osseous lesions. IMPRESSION: 1. The left infrahilar lesion shows interval decrease in size since the PET-CT but the degree of airspace consolidation in the posterior left lower lobe has progressed in the interval. The progressive posterior (more peripheral) consolidations seen on today's study may be evolution of postobstructive process as there is persistent marked mass-effect on posterior left lower lobe airways. 2. Small mediastinal lymph nodes are unchanged. 3. Otherwise stable. Electronically Signed   By: EMisty StanleyM.D.   On: 01/14/2017 14:37    ASSESSMENT AND PLAN:  This is a very pleasant 82years old white male with limited stage small cell lung cancer currently undergoing systemic chemotherapy was carboplatin and etoposide status post 2 cycles. This is concurrent with radiation. The patient is tolerating his treatment well with no significant adverse effects. He had CT scan of the chest performed recently. I personally and independently reviewed the scan images and discuss the results with the patient and his son. His scan showed significant improvement of his disease. I recommended for the patient to continue his current treatment with  carboplatin and etoposide and he will proceed with cycle #3 today as a scheduled. For the questionable urine discomfort, his previous urinalysis showed increase in the red blood cells. This could be questionable for small renal stone. I advised the patient to increase his oral intake of fluids. For the visual changes, he will continue his routine follow-up visit and evaluation by his ophthalmologist. The patient would come back for follow-up visit in 3 weeks for evaluation before starting cycle #4. He was advised to call immediately if he has any concerning symptoms in the interval. The patient voices understanding of current disease status and treatment options and is in agreement with the current care plan. All questions were answered. The patient knows to call the clinic with any problems, questions or concerns. We can certainly see the patient much sooner if necessary.  Disclaimer: This note was dictated with voice recognition software. Similar sounding words can inadvertently be transcribed and may not be corrected upon review.

## 2017-01-18 NOTE — Patient Instructions (Signed)
Implanted Port Home Guide An implanted port is a type of central line that is placed under the skin. Central lines are used to provide IV access when treatment or nutrition needs to be given through a person's veins. Implanted ports are used for long-term IV access. An implanted port may be placed because:  You need IV medicine that would be irritating to the small veins in your hands or arms.  You need long-term IV medicines, such as antibiotics.  You need IV nutrition for a long period.  You need frequent blood draws for lab tests.  You need dialysis.  Implanted ports are usually placed in the chest area, but they can also be placed in the upper arm, the abdomen, or the leg. An implanted port has two main parts:  Reservoir. The reservoir is round and will appear as a small, raised area under your skin. The reservoir is the part where a needle is inserted to give medicines or draw blood.  Catheter. The catheter is a thin, flexible tube that extends from the reservoir. The catheter is placed into a large vein. Medicine that is inserted into the reservoir goes into the catheter and then into the vein.  How will I care for my incision site? Do not get the incision site wet. Bathe or shower as directed by your health care provider. How is my port accessed? Special steps must be taken to access the port:  Before the port is accessed, a numbing cream can be placed on the skin. This helps numb the skin over the port site.  Your health care provider uses a sterile technique to access the port. ? Your health care provider must put on a mask and sterile gloves. ? The skin over your port is cleaned carefully with an antiseptic and allowed to dry. ? The port is gently pinched between sterile gloves, and a needle is inserted into the port.  Only "non-coring" port needles should be used to access the port. Once the port is accessed, a blood return should be checked. This helps ensure that the port  is in the vein and is not clogged.  If your port needs to remain accessed for a constant infusion, a clear (transparent) bandage will be placed over the needle site. The bandage and needle will need to be changed every week, or as directed by your health care provider.  Keep the bandage covering the needle clean and dry. Do not get it wet. Follow your health care provider's instructions on how to take a shower or bath while the port is accessed.  If your port does not need to stay accessed, no bandage is needed over the port.  What is flushing? Flushing helps keep the port from getting clogged. Follow your health care provider's instructions on how and when to flush the port. Ports are usually flushed with saline solution or a medicine called heparin. The need for flushing will depend on how the port is used.  If the port is used for intermittent medicines or blood draws, the port will need to be flushed: ? After medicines have been given. ? After blood has been drawn. ? As part of routine maintenance.  If a constant infusion is running, the port may not need to be flushed.  How long will my port stay implanted? The port can stay in for as long as your health care provider thinks it is needed. When it is time for the port to come out, surgery will be   done to remove it. The procedure is similar to the one performed when the port was put in. When should I seek immediate medical care? When you have an implanted port, you should seek immediate medical care if:  You notice a bad smell coming from the incision site.  You have swelling, redness, or drainage at the incision site.  You have more swelling or pain at the port site or the surrounding area.  You have a fever that is not controlled with medicine.  This information is not intended to replace advice given to you by your health care provider. Make sure you discuss any questions you have with your health care provider. Document  Released: 09/28/2005 Document Revised: 03/05/2016 Document Reviewed: 06/05/2013 [employer] Interactive Patient Education  2017 [employer]  

## 2017-01-18 NOTE — Telephone Encounter (Signed)
Gave patient AVS and calender per 4/9 los. Appts already scheduled per 4/9 los - no additional appts needed.

## 2017-01-18 NOTE — Patient Instructions (Signed)
  [facility] Discharge Instructions for Patients Receiving Chemotherapy  Today you received the following chemotherapy agents: Etoposide and Carboplatin.  To help prevent nausea and vomiting after your treatment, we encourage you to take your nausea medication as directed.    If you develop nausea and vomiting that is not controlled by your nausea medication, call the clinic.   BELOW ARE SYMPTOMS THAT SHOULD BE REPORTED IMMEDIATELY:  *FEVER GREATER THAN 100.5 F  *CHILLS WITH OR WITHOUT FEVER  NAUSEA AND VOMITING THAT IS NOT CONTROLLED WITH YOUR NAUSEA MEDICATION  *UNUSUAL SHORTNESS OF BREATH  *UNUSUAL BRUISING OR BLEEDING  TENDERNESS IN MOUTH AND THROAT WITH OR WITHOUT PRESENCE OF ULCERS  *URINARY PROBLEMS  *BOWEL PROBLEMS  UNUSUAL RASH Items with * indicate a potential emergency and should be followed up as soon as possible.  Feel free to call the clinic you have any questions or concerns. The clinic phone number is [phone].  Please show the [facility] at check-in to the Emergency Department and triage nurse.

## 2017-01-19 ENCOUNTER — Ambulatory Visit (HOSPITAL_BASED_OUTPATIENT_CLINIC_OR_DEPARTMENT_OTHER): Payer: Medicare Other

## 2017-01-19 ENCOUNTER — Ambulatory Visit
Admission: RE | Admit: 2017-01-19 | Discharge: 2017-01-19 | Disposition: A | Discharge status: Home / Self Care | Payer: Medicare Other | Source: Ambulatory Visit | Attending: Radiation Oncology | Admitting: Radiation Oncology

## 2017-01-19 VITALS — BP 122/66 | HR 69 | Temp 97.8°F | Resp 18

## 2017-01-19 DIAGNOSIS — Z5111 Encounter for antineoplastic chemotherapy: Secondary | ICD-10-CM

## 2017-01-19 DIAGNOSIS — Z510 Encounter for antineoplastic radiation therapy: Secondary | ICD-10-CM | POA: Diagnosis not present

## 2017-01-19 DIAGNOSIS — C3432 Malignant neoplasm of lower lobe, left bronchus or lung: Secondary | ICD-10-CM

## 2017-01-19 DIAGNOSIS — C3492 Malignant neoplasm of unspecified part of left bronchus or lung: Secondary | ICD-10-CM

## 2017-01-19 MED ORDER — DEXAMETHASONE SODIUM PHOSPHATE 10 MG/ML IJ SOLN
INTRAMUSCULAR | Status: AC
  Filled 2017-01-19: qty 1

## 2017-01-19 MED ORDER — HEPARIN SOD (PORK) LOCK FLUSH 100 UNIT/ML IV SOLN
500.0000 [IU] | Freq: Once | INTRAVENOUS | Status: AC | PRN
  Administered 2017-01-19: 500 [IU]
  Filled 2017-01-19: qty 5

## 2017-01-19 MED ORDER — SODIUM CHLORIDE 0.9% FLUSH
10.0000 mL | INTRAVENOUS | Status: DC | PRN
  Administered 2017-01-19: 10 mL
  Filled 2017-01-19: qty 10

## 2017-01-19 MED ORDER — SODIUM CHLORIDE 0.9 % IV SOLN
Freq: Once | INTRAVENOUS | Status: AC
  Administered 2017-01-19: 11:00:00 via INTRAVENOUS

## 2017-01-19 MED ORDER — SODIUM CHLORIDE 0.9 % IV SOLN
100.0000 mg/m2 | Freq: Once | INTRAVENOUS | Status: AC
  Administered 2017-01-19: 190 mg via INTRAVENOUS
  Filled 2017-01-19: qty 9.5

## 2017-01-19 MED ORDER — DEXAMETHASONE SODIUM PHOSPHATE 10 MG/ML IJ SOLN
10.0000 mg | Freq: Once | INTRAMUSCULAR | Status: AC
  Administered 2017-01-19: 10 mg via INTRAVENOUS

## 2017-01-19 NOTE — Patient Instructions (Signed)
  [facility] Discharge Instructions for Patients Receiving Chemotherapy  Today you received the following chemotherapy agent: Etoposide   To help prevent nausea and vomiting after your treatment, we encourage you to take your nausea medication as directed.    If you develop nausea and vomiting that is not controlled by your nausea medication, call the clinic.   BELOW ARE SYMPTOMS THAT SHOULD BE REPORTED IMMEDIATELY:  *FEVER GREATER THAN 100.5 F  *CHILLS WITH OR WITHOUT FEVER  NAUSEA AND VOMITING THAT IS NOT CONTROLLED WITH YOUR NAUSEA MEDICATION  *UNUSUAL SHORTNESS OF BREATH  *UNUSUAL BRUISING OR BLEEDING  TENDERNESS IN MOUTH AND THROAT WITH OR WITHOUT PRESENCE OF ULCERS  *URINARY PROBLEMS  *BOWEL PROBLEMS  UNUSUAL RASH Items with * indicate a potential emergency and should be followed up as soon as possible.  Feel free to call the clinic you have any questions or concerns. The clinic phone number is [phone].  Please show the [facility] at check-in to the Emergency Department and triage nurse.

## 2017-01-20 ENCOUNTER — Ambulatory Visit
Admission: RE | Admit: 2017-01-20 | Discharge: 2017-01-20 | Disposition: A | Discharge status: Home / Self Care | Payer: Medicare Other | Source: Ambulatory Visit | Attending: Radiation Oncology | Admitting: Radiation Oncology

## 2017-01-20 ENCOUNTER — Ambulatory Visit (HOSPITAL_BASED_OUTPATIENT_CLINIC_OR_DEPARTMENT_OTHER): Payer: Medicare Other

## 2017-01-20 VITALS — BP 127/76 | HR 60 | Temp 97.6°F | Resp 20

## 2017-01-20 DIAGNOSIS — Z5111 Encounter for antineoplastic chemotherapy: Secondary | ICD-10-CM

## 2017-01-20 DIAGNOSIS — Z510 Encounter for antineoplastic radiation therapy: Secondary | ICD-10-CM | POA: Diagnosis not present

## 2017-01-20 DIAGNOSIS — C3432 Malignant neoplasm of lower lobe, left bronchus or lung: Secondary | ICD-10-CM | POA: Diagnosis not present

## 2017-01-20 DIAGNOSIS — C3492 Malignant neoplasm of unspecified part of left bronchus or lung: Secondary | ICD-10-CM

## 2017-01-20 MED ORDER — DEXAMETHASONE SODIUM PHOSPHATE 10 MG/ML IJ SOLN
10.0000 mg | Freq: Once | INTRAMUSCULAR | Status: AC
  Administered 2017-01-20: 10 mg via INTRAVENOUS

## 2017-01-20 MED ORDER — SODIUM CHLORIDE 0.9% FLUSH
10.0000 mL | INTRAVENOUS | Status: DC | PRN
  Administered 2017-01-20: 10 mL
  Filled 2017-01-20: qty 10

## 2017-01-20 MED ORDER — HEPARIN SOD (PORK) LOCK FLUSH 100 UNIT/ML IV SOLN
500.0000 [IU] | Freq: Once | INTRAVENOUS | Status: AC | PRN
  Administered 2017-01-20: 500 [IU]
  Filled 2017-01-20: qty 5

## 2017-01-20 MED ORDER — DEXAMETHASONE SODIUM PHOSPHATE 10 MG/ML IJ SOLN
INTRAMUSCULAR | Status: AC
  Filled 2017-01-20: qty 1

## 2017-01-20 MED ORDER — SODIUM CHLORIDE 0.9 % IV SOLN
100.0000 mg/m2 | Freq: Once | INTRAVENOUS | Status: AC
  Administered 2017-01-20: 190 mg via INTRAVENOUS
  Filled 2017-01-20: qty 9.5

## 2017-01-20 MED ORDER — SODIUM CHLORIDE 0.9 % IV SOLN
Freq: Once | INTRAVENOUS | Status: AC
  Administered 2017-01-20: 08:00:00 via INTRAVENOUS

## 2017-01-20 NOTE — Patient Instructions (Signed)
[facility] Discharge Instructions for Patients Receiving Chemotherapy  Today you received the following chemotherapy agents: Etoposide   To help prevent nausea and vomiting after your treatment, we encourage you to take your nausea medication as directed.    If you develop nausea and vomiting that is not controlled by your nausea medication, call the clinic.   BELOW ARE SYMPTOMS THAT SHOULD BE REPORTED IMMEDIATELY:  *FEVER GREATER THAN 100.5 F  *CHILLS WITH OR WITHOUT FEVER  NAUSEA AND VOMITING THAT IS NOT CONTROLLED WITH YOUR NAUSEA MEDICATION  *UNUSUAL SHORTNESS OF BREATH  *UNUSUAL BRUISING OR BLEEDING  TENDERNESS IN MOUTH AND THROAT WITH OR WITHOUT PRESENCE OF ULCERS  *URINARY PROBLEMS  *BOWEL PROBLEMS  UNUSUAL RASH Items with * indicate a potential emergency and should be followed up as soon as possible.  Feel free to call the clinic you have any questions or concerns. The clinic phone number is [phone].  Please show the CHEMO ALERT CARD at check-in to the Emergency Department and triage nurse.   

## 2017-01-21 ENCOUNTER — Other Ambulatory Visit: Payer: Self-pay | Admitting: Radiation Oncology

## 2017-01-21 ENCOUNTER — Ambulatory Visit
Admission: RE | Admit: 2017-01-21 | Discharge: 2017-01-21 | Disposition: A | Discharge status: Home / Self Care | Payer: Medicare Other | Source: Ambulatory Visit | Attending: Radiation Oncology | Admitting: Radiation Oncology

## 2017-01-21 DIAGNOSIS — Z510 Encounter for antineoplastic radiation therapy: Secondary | ICD-10-CM | POA: Diagnosis not present

## 2017-01-21 DIAGNOSIS — K209 Esophagitis, unspecified without bleeding: Secondary | ICD-10-CM

## 2017-01-21 MED ORDER — SUCRALFATE 1 G PO TABS: 1.0000 g | ORAL_TABLET | Freq: Three times a day (TID) | ORAL | 2 refills | Status: DC

## 2017-01-22 ENCOUNTER — Ambulatory Visit
Admission: RE | Admit: 2017-01-22 | Discharge: 2017-01-22 | Disposition: A | Discharge status: Home / Self Care | Payer: Medicare Other | Source: Ambulatory Visit | Attending: Radiation Oncology | Admitting: Radiation Oncology

## 2017-01-22 ENCOUNTER — Ambulatory Visit (HOSPITAL_BASED_OUTPATIENT_CLINIC_OR_DEPARTMENT_OTHER): Payer: Medicare Other

## 2017-01-22 ENCOUNTER — Ambulatory Visit: Payer: Self-pay

## 2017-01-22 VITALS — BP 113/63 | HR 69 | Temp 97.5°F | Resp 18

## 2017-01-22 DIAGNOSIS — Z510 Encounter for antineoplastic radiation therapy: Secondary | ICD-10-CM | POA: Diagnosis not present

## 2017-01-22 DIAGNOSIS — Z5189 Encounter for other specified aftercare: Secondary | ICD-10-CM

## 2017-01-22 DIAGNOSIS — C3492 Malignant neoplasm of unspecified part of left bronchus or lung: Secondary | ICD-10-CM

## 2017-01-22 DIAGNOSIS — C3432 Malignant neoplasm of lower lobe, left bronchus or lung: Secondary | ICD-10-CM | POA: Diagnosis present

## 2017-01-22 MED ORDER — PEGFILGRASTIM INJECTION 6 MG/0.6ML ~~LOC~~
6.0000 mg | PREFILLED_SYRINGE | Freq: Once | SUBCUTANEOUS | Status: AC
  Administered 2017-01-22: 6 mg via SUBCUTANEOUS
  Filled 2017-01-22: qty 0.6

## 2017-01-22 NOTE — Patient Instructions (Signed)
Pegfilgrastim injection What is this medicine? PEGFILGRASTIM (PEG fil gra stim) is a long-acting granulocyte colony-stimulating factor that stimulates the growth of neutrophils, a type of white blood cell important in the body's fight against infection. It is used to reduce the incidence of fever and infection in patients with certain types of cancer who are receiving chemotherapy that affects the bone marrow, and to increase survival after being exposed to high doses of radiation. This medicine may be used for other purposes; ask your health care provider or pharmacist if you have questions. COMMON BRAND NAME(S): Neulasta What should I tell my health care provider before I take this medicine? They need to know if you have any of these conditions: -kidney disease -latex allergy -ongoing radiation therapy -sickle cell disease -skin reactions to acrylic adhesives (On-Body Injector only) -an unusual or allergic reaction to pegfilgrastim, filgrastim, other medicines, foods, dyes, or preservatives -pregnant or trying to get pregnant -breast-feeding How should I use this medicine? This medicine is for injection under the skin. If you get this medicine at home, you will be taught how to prepare and give the pre-filled syringe or how to use the On-body Injector. Refer to the patient Instructions for Use for detailed instructions. Use exactly as directed. Tell your healthcare provider immediately if you suspect that the On-body Injector may not have performed as intended or if you suspect the use of the On-body Injector resulted in a missed or partial dose. It is important that you put your used needles and syringes in a special sharps container. Do not put them in a trash can. If you do not have a sharps container, call your pharmacist or healthcare provider to get one. Talk to your pediatrician regarding the use of this medicine in children. While this drug may be prescribed for selected conditions,  precautions do apply. Overdosage: If you think you have taken too much of this medicine contact a poison control center or emergency room at once. NOTE: This medicine is only for you. Do not share this medicine with others. What if I miss a dose? It is important not to miss your dose. Call your doctor or health care professional if you miss your dose. If you miss a dose due to an On-body Injector failure or leakage, a new dose should be administered as soon as possible using a single prefilled syringe for manual use. What may interact with this medicine? Interactions have not been studied. Give your health care provider a list of all the medicines, herbs, non-prescription drugs, or dietary supplements you use. Also tell them if you smoke, drink alcohol, or use illegal drugs. Some items may interact with your medicine. This list may not describe all possible interactions. Give your health care provider a list of all the medicines, herbs, non-prescription drugs, or dietary supplements you use. Also tell them if you smoke, drink alcohol, or use illegal drugs. Some items may interact with your medicine. What should I watch for while using this medicine? You may need blood work done while you are taking this medicine. If you are going to need a MRI, CT scan, or other procedure, tell your doctor that you are using this medicine (On-Body Injector only). What side effects may I notice from receiving this medicine? Side effects that you should report to your doctor or health care professional as soon as possible: -allergic reactions like skin rash, itching or hives, swelling of the face, lips, or tongue -dizziness -fever -pain, redness, or irritation at site   where injected -pinpoint red spots on the skin -red or dark-brown urine -shortness of breath or breathing problems -stomach or side pain, or pain at the shoulder -swelling -tiredness -trouble passing urine or change in the amount of urine Side  effects that usually do not require medical attention (report to your doctor or health care professional if they continue or are bothersome): -bone pain -muscle pain This list may not describe all possible side effects. Call your doctor for medical advice about side effects. You may report side effects to FDA at 1-800-FDA-1088. Where should I keep my medicine? Keep out of the reach of children. Store pre-filled syringes in a refrigerator between 2 and 8 degrees C (36 and 46 degrees F). Do not freeze. Keep in carton to protect from light. Throw away this medicine if it is left out of the refrigerator for more than 48 hours. Throw away any unused medicine after the expiration date. NOTE: This sheet is a summary. It may not cover all possible information. If you have questions about this medicine, talk to your doctor, pharmacist, or health care provider.  2018 [employer] (2016-09-24 12:58:03)  

## 2017-01-25 ENCOUNTER — Other Ambulatory Visit (HOSPITAL_BASED_OUTPATIENT_CLINIC_OR_DEPARTMENT_OTHER): Payer: Medicare Other

## 2017-01-25 ENCOUNTER — Ambulatory Visit
Admission: RE | Admit: 2017-01-25 | Discharge: 2017-01-25 | Disposition: A | Discharge status: Home / Self Care | Payer: Medicare Other | Source: Ambulatory Visit | Attending: Radiation Oncology | Admitting: Radiation Oncology

## 2017-01-25 DIAGNOSIS — C3492 Malignant neoplasm of unspecified part of left bronchus or lung: Secondary | ICD-10-CM

## 2017-01-25 DIAGNOSIS — Z510 Encounter for antineoplastic radiation therapy: Secondary | ICD-10-CM | POA: Diagnosis not present

## 2017-01-25 DIAGNOSIS — C3432 Malignant neoplasm of lower lobe, left bronchus or lung: Secondary | ICD-10-CM | POA: Diagnosis present

## 2017-01-25 LAB — COMPREHENSIVE METABOLIC PANEL
ALT: 9 U/L (ref 0–55)
AST: 11 U/L (ref 5–34)
Albumin: 3.2 g/dL — ABNORMAL LOW (ref 3.5–5.0)
Alkaline Phosphatase: 91 U/L (ref 40–150)
Anion Gap: 7 mEq/L (ref 3–11)
BUN: 18.1 mg/dL (ref 7.0–26.0)
CHLORIDE: 104 meq/L (ref 98–109)
CO2: 30 mEq/L — ABNORMAL HIGH (ref 22–29)
Calcium: 9.3 mg/dL (ref 8.4–10.4)
Creatinine: 1 mg/dL (ref 0.7–1.3)
EGFR: 75 mL/min/{1.73_m2} — AB (ref >90)
GLUCOSE: 101 mg/dL (ref 70–140)
POTASSIUM: 4.2 meq/L (ref 3.5–5.1)
SODIUM: 141 meq/L (ref 136–145)
Total Bilirubin: 0.53 mg/dL (ref 0.20–1.20)
Total Protein: 6.3 g/dL — ABNORMAL LOW (ref 6.4–8.3)

## 2017-01-25 LAB — CBC WITH DIFFERENTIAL/PLATELET
BASO%: 0.2 % (ref 0.0–2.0)
Basophils Absolute: 0 10*3/uL (ref 0.0–0.1)
EOS%: 0.4 % (ref 0.0–7.0)
Eosinophils Absolute: 0.1 10*3/uL (ref 0.0–0.5)
HCT: 31.5 % — ABNORMAL LOW (ref 38.4–49.9)
HGB: 9.8 g/dL — ABNORMAL LOW (ref 13.0–17.1)
LYMPH#: 1.4 10*3/uL (ref 0.9–3.3)
LYMPH%: 10.9 % — AB (ref 14.0–49.0)
MCH: 26.8 pg — AB (ref 27.2–33.4)
MCHC: 31.1 g/dL — AB (ref 32.0–36.0)
MCV: 86.3 fL (ref 79.3–98.0)
MONO#: 0.2 10*3/uL (ref 0.1–0.9)
MONO%: 1.4 % (ref 0.0–14.0)
NEUT#: 10.8 10*3/uL — ABNORMAL HIGH (ref 1.5–6.5)
NEUT%: 87.1 % — AB (ref 39.0–75.0)
Platelets: 228 10*3/uL (ref 140–400)
RBC: 3.65 10*6/uL — ABNORMAL LOW (ref 4.20–5.82)
RDW: 18.2 % — ABNORMAL HIGH (ref 11.0–14.6)
WBC: 12.4 10*3/uL — AB (ref 4.0–10.3)
nRBC: 0 % (ref 0–0)

## 2017-01-26 ENCOUNTER — Ambulatory Visit
Admission: RE | Admit: 2017-01-26 | Discharge: 2017-01-26 | Disposition: A | Discharge status: Home / Self Care | Payer: Medicare Other | Source: Ambulatory Visit | Attending: Radiation Oncology | Admitting: Radiation Oncology

## 2017-01-26 DIAGNOSIS — Z510 Encounter for antineoplastic radiation therapy: Secondary | ICD-10-CM | POA: Diagnosis not present

## 2017-01-27 ENCOUNTER — Ambulatory Visit
Admission: RE | Admit: 2017-01-27 | Discharge: 2017-01-27 | Disposition: A | Discharge status: Home / Self Care | Payer: Medicare Other | Source: Ambulatory Visit | Attending: Radiation Oncology | Admitting: Radiation Oncology

## 2017-01-27 DIAGNOSIS — Z510 Encounter for antineoplastic radiation therapy: Secondary | ICD-10-CM | POA: Diagnosis not present

## 2017-01-28 ENCOUNTER — Ambulatory Visit: Payer: Medicare Other

## 2017-01-29 ENCOUNTER — Ambulatory Visit
Admission: RE | Admit: 2017-01-29 | Discharge: 2017-01-29 | Disposition: A | Discharge status: Home / Self Care | Payer: Medicare Other | Source: Ambulatory Visit | Attending: Radiation Oncology | Admitting: Radiation Oncology

## 2017-01-29 DIAGNOSIS — Z510 Encounter for antineoplastic radiation therapy: Secondary | ICD-10-CM | POA: Diagnosis not present

## 2017-02-01 ENCOUNTER — Other Ambulatory Visit (HOSPITAL_BASED_OUTPATIENT_CLINIC_OR_DEPARTMENT_OTHER): Payer: Medicare Other

## 2017-02-01 ENCOUNTER — Ambulatory Visit: Payer: Medicare Other

## 2017-02-01 DIAGNOSIS — C3432 Malignant neoplasm of lower lobe, left bronchus or lung: Secondary | ICD-10-CM

## 2017-02-01 DIAGNOSIS — C3492 Malignant neoplasm of unspecified part of left bronchus or lung: Secondary | ICD-10-CM

## 2017-02-01 LAB — CBC WITH DIFFERENTIAL/PLATELET
BASO%: 0.4 % (ref 0.0–2.0)
Basophils Absolute: 0 10*3/uL (ref 0.0–0.1)
EOS%: 1.8 % (ref 0.0–7.0)
Eosinophils Absolute: 0.1 10*3/uL (ref 0.0–0.5)
HCT: 30.7 % — ABNORMAL LOW (ref 38.4–49.9)
HEMOGLOBIN: 9.5 g/dL — AB (ref 13.0–17.1)
LYMPH%: 15.4 % (ref 14.0–49.0)
MCH: 27.5 pg (ref 27.2–33.4)
MCHC: 30.9 g/dL — ABNORMAL LOW (ref 32.0–36.0)
MCV: 88.7 fL (ref 79.3–98.0)
MONO#: 0.6 10*3/uL (ref 0.1–0.9)
MONO%: 9 % (ref 0.0–14.0)
NEUT%: 73.4 % (ref 39.0–75.0)
NEUTROS ABS: 4.9 10*3/uL (ref 1.5–6.5)
Platelets: 48 10*3/uL — ABNORMAL LOW (ref 140–400)
RBC: 3.46 10*6/uL — ABNORMAL LOW (ref 4.20–5.82)
RDW: 20.2 % — AB (ref 11.0–14.6)
WBC: 6.7 10*3/uL (ref 4.0–10.3)
lymph#: 1 10*3/uL (ref 0.9–3.3)

## 2017-02-01 LAB — COMPREHENSIVE METABOLIC PANEL
ALBUMIN: 3.2 g/dL — AB (ref 3.5–5.0)
ALK PHOS: 84 U/L (ref 40–150)
ALT: 10 U/L (ref 0–55)
AST: 13 U/L (ref 5–34)
Anion Gap: 9 mEq/L (ref 3–11)
BILIRUBIN TOTAL: 0.32 mg/dL (ref 0.20–1.20)
BUN: 14 mg/dL (ref 7.0–26.0)
CO2: 28 mEq/L (ref 22–29)
Calcium: 8.9 mg/dL (ref 8.4–10.4)
Chloride: 106 mEq/L (ref 98–109)
Creatinine: 1 mg/dL (ref 0.7–1.3)
EGFR: 71 mL/min/{1.73_m2} — ABNORMAL LOW (ref >90)
GLUCOSE: 98 mg/dL (ref 70–140)
Potassium: 4.1 mEq/L (ref 3.5–5.1)
SODIUM: 143 meq/L (ref 136–145)
TOTAL PROTEIN: 6 g/dL — AB (ref 6.4–8.3)

## 2017-02-02 ENCOUNTER — Ambulatory Visit
Admission: RE | Admit: 2017-02-02 | Discharge: 2017-02-02 | Disposition: A | Discharge status: Home / Self Care | Payer: Medicare Other | Source: Ambulatory Visit | Attending: Radiation Oncology | Admitting: Radiation Oncology

## 2017-02-02 DIAGNOSIS — Z510 Encounter for antineoplastic radiation therapy: Secondary | ICD-10-CM | POA: Diagnosis not present

## 2017-02-03 ENCOUNTER — Ambulatory Visit
Admission: RE | Admit: 2017-02-03 | Discharge: 2017-02-03 | Disposition: A | Discharge status: Home / Self Care | Payer: Medicare Other | Source: Ambulatory Visit | Attending: Radiation Oncology | Admitting: Radiation Oncology

## 2017-02-03 DIAGNOSIS — Z510 Encounter for antineoplastic radiation therapy: Secondary | ICD-10-CM | POA: Diagnosis not present

## 2017-02-04 ENCOUNTER — Ambulatory Visit
Admission: RE | Admit: 2017-02-04 | Discharge: 2017-02-04 | Disposition: A | Discharge status: Home / Self Care | Payer: Medicare Other | Source: Ambulatory Visit | Attending: Radiation Oncology | Admitting: Radiation Oncology

## 2017-02-04 DIAGNOSIS — Z510 Encounter for antineoplastic radiation therapy: Secondary | ICD-10-CM | POA: Diagnosis not present

## 2017-02-05 ENCOUNTER — Ambulatory Visit
Admission: RE | Admit: 2017-02-05 | Discharge: 2017-02-05 | Disposition: A | Discharge status: Home / Self Care | Payer: Medicare Other | Source: Ambulatory Visit | Attending: Radiation Oncology | Admitting: Radiation Oncology

## 2017-02-05 DIAGNOSIS — Z510 Encounter for antineoplastic radiation therapy: Secondary | ICD-10-CM | POA: Diagnosis not present

## 2017-02-08 ENCOUNTER — Other Ambulatory Visit (HOSPITAL_BASED_OUTPATIENT_CLINIC_OR_DEPARTMENT_OTHER): Payer: Medicare Other

## 2017-02-08 ENCOUNTER — Ambulatory Visit (HOSPITAL_BASED_OUTPATIENT_CLINIC_OR_DEPARTMENT_OTHER): Payer: Medicare Other | Admitting: Internal Medicine

## 2017-02-08 ENCOUNTER — Encounter: Payer: Self-pay | Admitting: Internal Medicine

## 2017-02-08 ENCOUNTER — Telehealth: Payer: Self-pay | Admitting: Internal Medicine

## 2017-02-08 ENCOUNTER — Other Ambulatory Visit: Payer: Self-pay | Admitting: Nurse Practitioner

## 2017-02-08 ENCOUNTER — Ambulatory Visit (HOSPITAL_BASED_OUTPATIENT_CLINIC_OR_DEPARTMENT_OTHER): Payer: Medicare Other

## 2017-02-08 ENCOUNTER — Ambulatory Visit
Admission: RE | Admit: 2017-02-08 | Discharge: 2017-02-08 | Disposition: A | Discharge status: Home / Self Care | Payer: Medicare Other | Source: Ambulatory Visit | Attending: Radiation Oncology | Admitting: Radiation Oncology

## 2017-02-08 ENCOUNTER — Ambulatory Visit: Payer: Medicare Other

## 2017-02-08 VITALS — BP 119/72 | HR 66 | Temp 98.2°F | Resp 18 | Ht 70.0 in | Wt 157.9 lb

## 2017-02-08 DIAGNOSIS — Z5111 Encounter for antineoplastic chemotherapy: Secondary | ICD-10-CM

## 2017-02-08 DIAGNOSIS — Z510 Encounter for antineoplastic radiation therapy: Secondary | ICD-10-CM | POA: Diagnosis not present

## 2017-02-08 DIAGNOSIS — Z95828 Presence of other vascular implants and grafts: Secondary | ICD-10-CM

## 2017-02-08 DIAGNOSIS — G893 Neoplasm related pain (acute) (chronic): Secondary | ICD-10-CM | POA: Diagnosis not present

## 2017-02-08 DIAGNOSIS — D6481 Anemia due to antineoplastic chemotherapy: Secondary | ICD-10-CM

## 2017-02-08 DIAGNOSIS — C3492 Malignant neoplasm of unspecified part of left bronchus or lung: Secondary | ICD-10-CM

## 2017-02-08 DIAGNOSIS — C3432 Malignant neoplasm of lower lobe, left bronchus or lung: Secondary | ICD-10-CM

## 2017-02-08 HISTORY — DX: Neoplasm related pain (acute) (chronic): G89.3

## 2017-02-08 LAB — COMPREHENSIVE METABOLIC PANEL
ALT: 10 U/L (ref 0–55)
AST: 14 U/L (ref 5–34)
Albumin: 3.5 g/dL (ref 3.5–5.0)
Alkaline Phosphatase: 82 U/L (ref 40–150)
Anion Gap: 9 mEq/L (ref 3–11)
BUN: 13 mg/dL (ref 7.0–26.0)
CHLORIDE: 106 meq/L (ref 98–109)
CO2: 27 meq/L (ref 22–29)
CREATININE: 1 mg/dL (ref 0.7–1.3)
Calcium: 9.2 mg/dL (ref 8.4–10.4)
EGFR: 68 mL/min/{1.73_m2} — ABNORMAL LOW (ref >90)
GLUCOSE: 101 mg/dL (ref 70–140)
POTASSIUM: 4.3 meq/L (ref 3.5–5.1)
SODIUM: 141 meq/L (ref 136–145)
Total Bilirubin: 0.33 mg/dL (ref 0.20–1.20)
Total Protein: 6.4 g/dL (ref 6.4–8.3)

## 2017-02-08 LAB — CBC WITH DIFFERENTIAL/PLATELET
BASO%: 0.2 % (ref 0.0–2.0)
BASOS ABS: 0 10*3/uL (ref 0.0–0.1)
EOS%: 1.7 % (ref 0.0–7.0)
Eosinophils Absolute: 0.1 10*3/uL (ref 0.0–0.5)
HCT: 31.3 % — ABNORMAL LOW (ref 38.4–49.9)
HGB: 9.8 g/dL — ABNORMAL LOW (ref 13.0–17.1)
LYMPH%: 11.9 % — AB (ref 14.0–49.0)
MCH: 27.8 pg (ref 27.2–33.4)
MCHC: 31.3 g/dL — AB (ref 32.0–36.0)
MCV: 88.7 fL (ref 79.3–98.0)
MONO#: 0.5 10*3/uL (ref 0.1–0.9)
MONO%: 8.4 % (ref 0.0–14.0)
NEUT#: 4.2 10*3/uL (ref 1.5–6.5)
NEUT%: 77.8 % — AB (ref 39.0–75.0)
Platelets: 149 10*3/uL (ref 140–400)
RBC: 3.53 10*6/uL — AB (ref 4.20–5.82)
RDW: 21.7 % — ABNORMAL HIGH (ref 11.0–14.6)
WBC: 5.4 10*3/uL (ref 4.0–10.3)
lymph#: 0.6 10*3/uL — ABNORMAL LOW (ref 0.9–3.3)

## 2017-02-08 MED ORDER — OXYCODONE-ACETAMINOPHEN 5-325 MG PO TABS: 1.0000 | ORAL_TABLET | Freq: Four times a day (QID) | ORAL | 0 refills | Status: DC | PRN

## 2017-02-08 MED ORDER — SODIUM CHLORIDE 0.9 % IV SOLN
100.0000 mg/m2 | Freq: Once | INTRAVENOUS | Status: AC
  Administered 2017-02-08: 190 mg via INTRAVENOUS
  Filled 2017-02-08: qty 9.5

## 2017-02-08 MED ORDER — SODIUM CHLORIDE 0.9 % IV SOLN
Freq: Once | INTRAVENOUS | Status: AC
  Administered 2017-02-08: 11:00:00 via INTRAVENOUS

## 2017-02-08 MED ORDER — DEXAMETHASONE SODIUM PHOSPHATE 10 MG/ML IJ SOLN
10.0000 mg | Freq: Once | INTRAMUSCULAR | Status: AC
  Administered 2017-02-08: 10 mg via INTRAVENOUS

## 2017-02-08 MED ORDER — SODIUM CHLORIDE 0.9 % IV SOLN
402.5000 mg | Freq: Once | INTRAVENOUS | Status: AC
  Administered 2017-02-08: 400 mg via INTRAVENOUS
  Filled 2017-02-08: qty 40

## 2017-02-08 MED ORDER — HEPARIN SOD (PORK) LOCK FLUSH 100 UNIT/ML IV SOLN
500.0000 [IU] | Freq: Once | INTRAVENOUS | Status: AC | PRN
  Administered 2017-02-08: 500 [IU]
  Filled 2017-02-08: qty 5

## 2017-02-08 MED ORDER — DEXAMETHASONE SODIUM PHOSPHATE 10 MG/ML IJ SOLN
INTRAMUSCULAR | Status: AC
  Filled 2017-02-08: qty 1

## 2017-02-08 MED ORDER — SODIUM CHLORIDE 0.9% FLUSH
10.0000 mL | INTRAVENOUS | Status: DC | PRN
  Administered 2017-02-08: 10 mL
  Filled 2017-02-08: qty 10

## 2017-02-08 MED ORDER — PALONOSETRON HCL INJECTION 0.25 MG/5ML
0.2500 mg | Freq: Once | INTRAVENOUS | Status: AC
  Administered 2017-02-08: 0.25 mg via INTRAVENOUS

## 2017-02-08 MED ORDER — SODIUM CHLORIDE 0.9% FLUSH
10.0000 mL | INTRAVENOUS | Status: DC | PRN
  Administered 2017-02-08: 10 mL via INTRAVENOUS
  Filled 2017-02-08: qty 10

## 2017-02-08 MED ORDER — PALONOSETRON HCL INJECTION 0.25 MG/5ML
INTRAVENOUS | Status: AC
  Filled 2017-02-08: qty 5

## 2017-02-08 NOTE — Patient Instructions (Signed)
  [facility] Discharge Instructions for Patients Receiving Chemotherapy  Today you received the following chemotherapy agents: Etoposide and Carboplatin.  To help prevent nausea and vomiting after your treatment, we encourage you to take your nausea medication as directed.    If you develop nausea and vomiting that is not controlled by your nausea medication, call the clinic.   BELOW ARE SYMPTOMS THAT SHOULD BE REPORTED IMMEDIATELY:  *FEVER GREATER THAN 100.5 F  *CHILLS WITH OR WITHOUT FEVER  NAUSEA AND VOMITING THAT IS NOT CONTROLLED WITH YOUR NAUSEA MEDICATION  *UNUSUAL SHORTNESS OF BREATH  *UNUSUAL BRUISING OR BLEEDING  TENDERNESS IN MOUTH AND THROAT WITH OR WITHOUT PRESENCE OF ULCERS  *URINARY PROBLEMS  *BOWEL PROBLEMS  UNUSUAL RASH Items with * indicate a potential emergency and should be followed up as soon as possible.  Feel free to call the clinic you have any questions or concerns. The clinic phone number is [phone].  Please show the [facility] at check-in to the Emergency Department and triage nurse.

## 2017-02-08 NOTE — Progress Notes (Signed)
[place] [phone]   [phone]  OFFICE PROGRESS NOTE  [name], NP [place] [address]  DIAGNOSIS: Limited stage, Stage IIIA (T3, N2, M0) small cell lung cancer diagnosed in February 2018 and presented with left lower lobe lung mass in addition to subcarinal lymphadenopathy.  PRIOR THERAPY: None  CURRENT THERAPY: Systemic chemotherapy with carboplatin for AUC of 5 on day 1 and etoposide 100 MG/M2 on days 1, 2 and 3 with Neulasta support. This will be concurrent with radiotherapy. Status post 3 cycles.  INTERVAL HISTORY: Christian Munoz 82 y.o. male returns to the clinic today for follow-up visit accompanied by his son. The patient is currently undergoing systemic chemotherapy with carboplatin and etoposide status post 3 cycles and has been tolerating the treatment well with no significant adverse effects except for pain on the left side of the chest mainly on the back. This could be secondary to her radiotherapy. He denied having any shortness of breath, cough or hemoptysis. He has no fever or chills. He denied having any nausea, vomiting, diarrhea or constipation. He is here today for evaluation before starting cycle #4.  MEDICAL HISTORY: Past Medical History:  Diagnosis Date  . Allergic rhinitis due to pollen   . Anginal pain ([place])    Past medical history " does not seem to be a problem at all now."  . Coronary atherosclerosis of native coronary artery   . Coronary atherosclerosis of native coronary artery   . Depressive disorder, not elsewhere classified   . Dysuria 01/18/2017  . Encounter for antineoplastic chemotherapy 11/30/2016  . GERD (gastroesophageal reflux disease)   . Goals of care, counseling/discussion 11/30/2016  . Headache    PMH: Migraines  . Herpes genitalia   . Hypertrophy of prostate without urinary obstruction and other lower urinary tract symptoms (LUTS)   . Hypertrophy of prostate  without urinary obstruction and other lower urinary tract symptoms (LUTS)   . Lack of coordination   . Lack of coordination   . Lumbago   . Nausea and vomiting 11/30/2016  . Osteoarthrosis, unspecified whether generalized or localized, unspecified site   . Osteoarthrosis, unspecified whether generalized or localized, unspecified site   . Other and unspecified hyperlipidemia   . Other malaise and fatigue   . Pneumonia   . Reflux esophagitis   . Skin cancer   . Sleep apnea    does not wear CPAP  . Syncope and collapse   . Urinary frequency   . Vitamin D deficiency     ALLERGIES:  is allergic to chantix [varenicline] and zoloft [sertraline hcl].  MEDICATIONS:  Current Outpatient Prescriptions  Medication Sig Dispense Refill  . aspirin 81 MG tablet Take 81 mg by mouth daily. Take one tablet once a day    . escitalopram (LEXAPRO) 5 MG tablet TAKE 1 TABLET(5 MG) BY MOUTH EVERY MORNING 30 tablet 2  . fish oil-omega-3 fatty acids 1000 MG capsule Take 2 g by mouth daily. Take one tablet twice a day    . metoprolol tartrate (LOPRESSOR) 25 MG tablet Take 0.5 tablets (12.5 mg total) by mouth 2 (two) times daily. 90 tablet 1  . Multiple Vitamins-Minerals (MULTIVITAMIN WITH MINERALS) tablet Take 1 tablet by mouth daily.    . pantoprazole (PROTONIX) 20 MG tablet TAKE 1 TABLET(20 MG) BY MOUTH DAILY 90 tablet 1  . prochlorperazine (COMPAZINE) 10 MG tablet Take 1 tablet (10 mg total) by mouth every 6 (six)  hours as needed for nausea or vomiting. 30 tablet 0  . QUEtiapine (SEROQUEL) 25 MG tablet Take 1 tablet (25 mg total) by mouth at bedtime. 1/2- 1 pill po qhs 30 tablet 2  . simvastatin (ZOCOR) 20 MG tablet TAKE 1 TABLET(20 MG) BY MOUTH DAILY 90 tablet 2  . sucralfate (CARAFATE) 1 g tablet Take 1 tablet (1 g total) by mouth 4 (four) times daily -  with meals and at bedtime. 5 min before meals for radiation induced esophagitis 120 tablet 2  . tamsulosin (FLOMAX) 0.4 MG CAPS capsule TAKE 1 CAPSULE(0.4  MG) BY MOUTH DAILY 90 capsule 0   No current facility-administered medications for this visit.     SURGICAL HISTORY:  Past Surgical History:  Procedure Laterality Date  . CARDIAC CATHETERIZATION    . COLONOSCOPY    . EYE SURGERY    . IR GENERIC HISTORICAL  12/04/2016   IR US GUIDE VASC ACCESS RIGHT 12/04/2016 Greggory Keen, MD [facility]  . IR GENERIC HISTORICAL  12/04/2016   IR FLUORO GUIDE PORT INSERTION RIGHT 12/04/2016 Greggory Keen, MD [facility]  . poly vocal cords  1985  . SKIN CANCER EXCISION  2011  . TONSILLECTOMY    . VASECTOMY    . VIDEO BRONCHOSCOPY WITH ENDOBRONCHIAL ULTRASOUND N/A 11/20/2016   Procedure: VIDEO BRONCHOSCOPY WITH ENDOBRONCHIAL ULTRASOUND;  Surgeon: Collene Gobble, MD;  Location: [facility];  Service: Thoracic;  Laterality: N/A;    REVIEW OF SYSTEMS:  Constitutional: positive for fatigue Eyes: negative Ears, nose, mouth, throat, and face: negative Respiratory: positive for pleurisy/chest pain Cardiovascular: negative Gastrointestinal: negative Genitourinary:negative Integument/breast: negative Hematologic/lymphatic: negative Musculoskeletal:negative Neurological: negative Behavioral/Psych: negative Endocrine: negative Allergic/Immunologic: negative   PHYSICAL EXAMINATION: General appearance: alert, cooperative, fatigued and no distress Head: Normocephalic, without obvious abnormality, atraumatic Neck: no adenopathy, no JVD, supple, symmetrical, trachea midline and thyroid not enlarged, symmetric, no tenderness/mass/nodules Lymph nodes: Cervical, supraclavicular, and axillary nodes normal. Resp: clear to auscultation bilaterally Back: symmetric, no curvature. ROM normal. No CVA tenderness. Cardio: regular rate and rhythm, S1, S2 normal, no murmur, click, rub or gallop GI: soft, non-tender; bowel sounds normal; no masses,  no organomegaly Extremities: extremities normal, atraumatic, no cyanosis or edema Neurologic: Alert and oriented X 3, normal  strength and tone. Normal symmetric reflexes. Normal coordination and gait  ECOG PERFORMANCE STATUS: 1 - Symptomatic but completely ambulatory  Blood pressure 119/72, pulse 66, temperature 98.2 F (36.8 C), temperature source Oral, resp. rate 18, height '5\' 10"'$  (1.778 m), weight 157 lb 14.4 oz (71.6 kg), SpO2 96 %.  LABORATORY DATA: Lab Results  Component Value Date   WBC 5.4 02/08/2017   HGB 9.8 (L) 02/08/2017   HCT 31.3 (L) 02/08/2017   MCV 88.7 02/08/2017   PLT 149 02/08/2017      Chemistry      Component Value Date/Time   NA 143 02/01/2017 0946   K 4.1 02/01/2017 0946   CL 105 12/04/2016 0814   CO2 28 02/01/2017 0946   BUN 14.0 02/01/2017 0946   CREATININE 1.0 02/01/2017 0946      Component Value Date/Time   CALCIUM 8.9 02/01/2017 0946   ALKPHOS 84 02/01/2017 0946   AST 13 02/01/2017 0946   ALT 10 02/01/2017 0946   BILITOT 0.32 02/01/2017 0946       RADIOGRAPHIC STUDIES: Ct Chest W Contrast  Result Date: 01/14/2017 CLINICAL DATA:  Left side small-cell lung cancer. EXAM: CT CHEST WITH CONTRAST TECHNIQUE: Multidetector CT imaging of the chest was  performed during intravenous contrast administration. CONTRAST:  19m ISOVUE-300 IOPAMIDOL (ISOVUE-300) INJECTION 61% COMPARISON:  PET-CT 12/03/2016.  Chest CT 10/23/2016. FINDINGS: Cardiovascular: The heart size is normal. No pericardial effusion. Coronary artery calcification is noted. Right Port-A-Cath tip is positioned at the distal SVC near the junction with the RA. Mediastinum/Nodes: 6 mm short axis height retrotracheal lymph node not substantially changed since PET-CT. Tiny prevascular an AP window lymph nodes appear stable. Hypermetabolic subcarinal lymph nodes seen on the previous PET-CT are stable in size and not enlarged by CT criteria. Index 7 mm subcarinal lymph node is identified on image 74 series 2. No right hilar lymphadenopathy. Abnormal soft tissue is identified in the left hilum, mainly in the inferior aspect and  infrahilar left lung. The esophagus has normal imaging features. Lungs/Pleura: Centrilobular and paraseptal emphysema noted bilaterally. Compressive atelectasis noted in the dependent right lower lobe. The confluent left infrahilar opacity has decreased in the interval measuring approximately 4.7 x 5.6 cm today compared to 7.4 x 7.0 cm at the same location when I remeasure it on the PET-CT. While the main infrahilar lesion appears decreased and mass-effect on the posterior left lower lobe airways has improved, there is interval progression of posterior left lower lobe consolidation. Tiny left pleural effusion again noted. Upper Abdomen: Multiple cysts are again identified in the liver measuring up to 2.9 cm Musculoskeletal: Bone windows reveal no worrisome lytic or sclerotic osseous lesions. IMPRESSION: 1. The left infrahilar lesion shows interval decrease in size since the PET-CT but the degree of airspace consolidation in the posterior left lower lobe has progressed in the interval. The progressive posterior (more peripheral) consolidations seen on today's study may be evolution of postobstructive process as there is persistent marked mass-effect on posterior left lower lobe airways. 2. Small mediastinal lymph nodes are unchanged. 3. Otherwise stable. Electronically Signed   By: EMisty StanleyM.D.   On: 01/14/2017 14:37    ASSESSMENT AND PLAN:  This is a very pleasant 82years old white male with limited stage small cell lung cancer currently undergoing systemic chemotherapy was carboplatin and etoposide status post 3 cycles. This is now concurrent with radiation. The patient has been tolerating his treatment well with no significant adverse effects except for pain on the left side of the back and chest. This could be secondary to radiotherapy. I recommended for the patient to proceed with cycle #4 today as a scheduled. For pain management, I will start the patient on Percocet 5/325 by mouth every 6 hours as  needed for pain. For the chemotherapy-induced anemia, I will continue to monitor his hemoglobin and hematocrit closely and consider the patient for transfusion if needed. I will see the patient back for follow-up visit in 3 weeks for evaluation after repeating CT scan of the chest for restaging of his disease. He was advised to call immediately if he has any concerning symptoms in the interval. The patient voices understanding of current disease status and treatment options and is in agreement with the current care plan. All questions were answered. The patient knows to call the clinic with any problems, questions or concerns. We can certainly see the patient much sooner if necessary.  Disclaimer: This note was dictated with voice recognition software. Similar sounding words can inadvertently be transcribed and may not be corrected upon review.

## 2017-02-08 NOTE — Telephone Encounter (Signed)
Gave patient/relative avs report and appointments for April thru June. Central radiology will call re scan.

## 2017-02-09 ENCOUNTER — Ambulatory Visit (HOSPITAL_BASED_OUTPATIENT_CLINIC_OR_DEPARTMENT_OTHER): Payer: Medicare Other

## 2017-02-09 ENCOUNTER — Ambulatory Visit
Admission: RE | Admit: 2017-02-09 | Discharge: 2017-02-09 | Disposition: A | Discharge status: Home / Self Care | Payer: Medicare Other | Source: Ambulatory Visit | Attending: Radiation Oncology | Admitting: Radiation Oncology

## 2017-02-09 VITALS — BP 123/77 | HR 60 | Temp 97.6°F | Resp 17

## 2017-02-09 DIAGNOSIS — Z5111 Encounter for antineoplastic chemotherapy: Secondary | ICD-10-CM

## 2017-02-09 DIAGNOSIS — C3432 Malignant neoplasm of lower lobe, left bronchus or lung: Secondary | ICD-10-CM

## 2017-02-09 DIAGNOSIS — C3492 Malignant neoplasm of unspecified part of left bronchus or lung: Secondary | ICD-10-CM

## 2017-02-09 DIAGNOSIS — Z510 Encounter for antineoplastic radiation therapy: Secondary | ICD-10-CM | POA: Diagnosis not present

## 2017-02-09 MED ORDER — SODIUM CHLORIDE 0.9 % IV SOLN
Freq: Once | INTRAVENOUS | Status: AC
  Administered 2017-02-09: 14:00:00 via INTRAVENOUS

## 2017-02-09 MED ORDER — HEPARIN SOD (PORK) LOCK FLUSH 100 UNIT/ML IV SOLN
500.0000 [IU] | Freq: Once | INTRAVENOUS | Status: AC | PRN
  Administered 2017-02-09: 500 [IU]
  Filled 2017-02-09: qty 5

## 2017-02-09 MED ORDER — SODIUM CHLORIDE 0.9% FLUSH
10.0000 mL | INTRAVENOUS | Status: DC | PRN
  Administered 2017-02-09: 10 mL
  Filled 2017-02-09: qty 10

## 2017-02-09 MED ORDER — ETOPOSIDE CHEMO INJECTION 1 GM/50ML
100.0000 mg/m2 | Freq: Once | INTRAVENOUS | Status: AC
  Administered 2017-02-09: 190 mg via INTRAVENOUS
  Filled 2017-02-09: qty 9.5

## 2017-02-09 MED ORDER — DEXAMETHASONE SODIUM PHOSPHATE 10 MG/ML IJ SOLN
INTRAMUSCULAR | Status: AC
  Filled 2017-02-09: qty 1

## 2017-02-09 MED ORDER — DEXAMETHASONE SODIUM PHOSPHATE 10 MG/ML IJ SOLN
10.0000 mg | Freq: Once | INTRAMUSCULAR | Status: AC
  Administered 2017-02-09: 10 mg via INTRAVENOUS

## 2017-02-09 NOTE — Patient Instructions (Signed)
[facility] Discharge Instructions for Patients Receiving Chemotherapy  Today you received the following chemotherapy agents Etoposide.   To help prevent nausea and vomiting after your treatment, we encourage you to take your nausea medication as prescribed.   If you develop nausea and vomiting that is not controlled by your nausea medication, call the clinic.   BELOW ARE SYMPTOMS THAT SHOULD BE REPORTED IMMEDIATELY:  *FEVER GREATER THAN 100.5 F  *CHILLS WITH OR WITHOUT FEVER  NAUSEA AND VOMITING THAT IS NOT CONTROLLED WITH YOUR NAUSEA MEDICATION  *UNUSUAL SHORTNESS OF BREATH  *UNUSUAL BRUISING OR BLEEDING  TENDERNESS IN MOUTH AND THROAT WITH OR WITHOUT PRESENCE OF ULCERS  *URINARY PROBLEMS  *BOWEL PROBLEMS  UNUSUAL RASH Items with * indicate a potential emergency and should be followed up as soon as possible.  Feel free to call the clinic you have any questions or concerns. The clinic phone number is [phone].  Please show the CHEMO ALERT CARD at check-in to the Emergency Department and triage nurse.   

## 2017-02-10 ENCOUNTER — Ambulatory Visit
Admission: RE | Admit: 2017-02-10 | Discharge: 2017-02-10 | Disposition: A | Discharge status: Home / Self Care | Payer: Medicare Other | Source: Ambulatory Visit | Attending: Radiation Oncology | Admitting: Radiation Oncology

## 2017-02-10 ENCOUNTER — Ambulatory Visit (HOSPITAL_BASED_OUTPATIENT_CLINIC_OR_DEPARTMENT_OTHER): Payer: Medicare Other

## 2017-02-10 VITALS — BP 137/80 | HR 60 | Temp 98.5°F | Resp 18

## 2017-02-10 DIAGNOSIS — Z5111 Encounter for antineoplastic chemotherapy: Secondary | ICD-10-CM | POA: Diagnosis present

## 2017-02-10 DIAGNOSIS — C3432 Malignant neoplasm of lower lobe, left bronchus or lung: Secondary | ICD-10-CM | POA: Diagnosis not present

## 2017-02-10 DIAGNOSIS — C3492 Malignant neoplasm of unspecified part of left bronchus or lung: Secondary | ICD-10-CM

## 2017-02-10 DIAGNOSIS — Z510 Encounter for antineoplastic radiation therapy: Secondary | ICD-10-CM | POA: Diagnosis not present

## 2017-02-10 MED ORDER — SODIUM CHLORIDE 0.9% FLUSH
10.0000 mL | INTRAVENOUS | Status: DC | PRN
  Administered 2017-02-10: 10 mL
  Filled 2017-02-10: qty 10

## 2017-02-10 MED ORDER — DEXAMETHASONE SODIUM PHOSPHATE 10 MG/ML IJ SOLN
INTRAMUSCULAR | Status: AC
  Filled 2017-02-10: qty 1

## 2017-02-10 MED ORDER — DEXAMETHASONE SODIUM PHOSPHATE 10 MG/ML IJ SOLN
10.0000 mg | Freq: Once | INTRAMUSCULAR | Status: AC
  Administered 2017-02-10: 10 mg via INTRAVENOUS

## 2017-02-10 MED ORDER — HEPARIN SOD (PORK) LOCK FLUSH 100 UNIT/ML IV SOLN
500.0000 [IU] | Freq: Once | INTRAVENOUS | Status: AC | PRN
  Administered 2017-02-10: 500 [IU]
  Filled 2017-02-10: qty 5

## 2017-02-10 MED ORDER — SODIUM CHLORIDE 0.9 % IV SOLN
100.0000 mg/m2 | Freq: Once | INTRAVENOUS | Status: AC
  Administered 2017-02-10: 190 mg via INTRAVENOUS
  Filled 2017-02-10: qty 9.5

## 2017-02-10 MED ORDER — SODIUM CHLORIDE 0.9 % IV SOLN
Freq: Once | INTRAVENOUS | Status: AC
  Administered 2017-02-10: 09:00:00 via INTRAVENOUS

## 2017-02-10 NOTE — Patient Instructions (Signed)
[facility] Discharge Instructions for Patients Receiving Chemotherapy  Today you received the following chemotherapy agents Etoposide.   To help prevent nausea and vomiting after your treatment, we encourage you to take your nausea medication as prescribed.   If you develop nausea and vomiting that is not controlled by your nausea medication, call the clinic.   BELOW ARE SYMPTOMS THAT SHOULD BE REPORTED IMMEDIATELY:  *FEVER GREATER THAN 100.5 F  *CHILLS WITH OR WITHOUT FEVER  NAUSEA AND VOMITING THAT IS NOT CONTROLLED WITH YOUR NAUSEA MEDICATION  *UNUSUAL SHORTNESS OF BREATH  *UNUSUAL BRUISING OR BLEEDING  TENDERNESS IN MOUTH AND THROAT WITH OR WITHOUT PRESENCE OF ULCERS  *URINARY PROBLEMS  *BOWEL PROBLEMS  UNUSUAL RASH Items with * indicate a potential emergency and should be followed up as soon as possible.  Feel free to call the clinic you have any questions or concerns. The clinic phone number is [phone].  Please show the CHEMO ALERT CARD at check-in to the Emergency Department and triage nurse.   

## 2017-02-11 ENCOUNTER — Ambulatory Visit
Admission: RE | Admit: 2017-02-11 | Discharge: 2017-02-11 | Disposition: A | Discharge status: Home / Self Care | Payer: Medicare Other | Source: Ambulatory Visit | Attending: Radiation Oncology | Admitting: Radiation Oncology

## 2017-02-11 DIAGNOSIS — Z510 Encounter for antineoplastic radiation therapy: Secondary | ICD-10-CM | POA: Diagnosis not present

## 2017-02-12 ENCOUNTER — Ambulatory Visit (HOSPITAL_BASED_OUTPATIENT_CLINIC_OR_DEPARTMENT_OTHER): Payer: Medicare Other

## 2017-02-12 ENCOUNTER — Ambulatory Visit
Admission: RE | Admit: 2017-02-12 | Discharge: 2017-02-12 | Disposition: A | Discharge status: Home / Self Care | Payer: Medicare Other | Source: Ambulatory Visit | Attending: Radiation Oncology | Admitting: Radiation Oncology

## 2017-02-12 VITALS — BP 108/59 | HR 66 | Temp 97.2°F | Resp 16

## 2017-02-12 DIAGNOSIS — C3492 Malignant neoplasm of unspecified part of left bronchus or lung: Secondary | ICD-10-CM

## 2017-02-12 DIAGNOSIS — Z5189 Encounter for other specified aftercare: Secondary | ICD-10-CM | POA: Diagnosis not present

## 2017-02-12 DIAGNOSIS — Z510 Encounter for antineoplastic radiation therapy: Secondary | ICD-10-CM | POA: Diagnosis not present

## 2017-02-12 DIAGNOSIS — C3432 Malignant neoplasm of lower lobe, left bronchus or lung: Secondary | ICD-10-CM

## 2017-02-12 MED ORDER — PEGFILGRASTIM INJECTION 6 MG/0.6ML ~~LOC~~
6.0000 mg | PREFILLED_SYRINGE | Freq: Once | SUBCUTANEOUS | Status: AC
  Administered 2017-02-12: 6 mg via SUBCUTANEOUS
  Filled 2017-02-12: qty 0.6

## 2017-02-15 ENCOUNTER — Ambulatory Visit
Admission: RE | Admit: 2017-02-15 | Discharge: 2017-02-15 | Disposition: A | Discharge status: Home / Self Care | Payer: Medicare Other | Source: Ambulatory Visit | Attending: Radiation Oncology | Admitting: Radiation Oncology

## 2017-02-15 ENCOUNTER — Other Ambulatory Visit (HOSPITAL_BASED_OUTPATIENT_CLINIC_OR_DEPARTMENT_OTHER): Payer: Medicare Other

## 2017-02-15 DIAGNOSIS — C3492 Malignant neoplasm of unspecified part of left bronchus or lung: Secondary | ICD-10-CM

## 2017-02-15 DIAGNOSIS — C3432 Malignant neoplasm of lower lobe, left bronchus or lung: Secondary | ICD-10-CM | POA: Diagnosis present

## 2017-02-15 DIAGNOSIS — Z510 Encounter for antineoplastic radiation therapy: Secondary | ICD-10-CM | POA: Diagnosis not present

## 2017-02-15 LAB — CBC WITH DIFFERENTIAL/PLATELET
BASO%: 0.1 % (ref 0.0–2.0)
Basophils Absolute: 0 10*3/uL (ref 0.0–0.1)
EOS%: 0.7 % (ref 0.0–7.0)
Eosinophils Absolute: 0 10*3/uL (ref 0.0–0.5)
HCT: 29.7 % — ABNORMAL LOW (ref 38.4–49.9)
HGB: 9.9 g/dL — ABNORMAL LOW (ref 13.0–17.1)
LYMPH%: 7.2 % — AB (ref 14.0–49.0)
MCH: 29.2 pg (ref 27.2–33.4)
MCHC: 33.4 g/dL (ref 32.0–36.0)
MCV: 87.5 fL (ref 79.3–98.0)
MONO#: 0.2 10*3/uL (ref 0.1–0.9)
MONO%: 3.2 % (ref 0.0–14.0)
NEUT%: 88.8 % — AB (ref 39.0–75.0)
NEUTROS ABS: 6.3 10*3/uL (ref 1.5–6.5)
PLATELETS: 153 10*3/uL (ref 140–400)
RBC: 3.4 10*6/uL — AB (ref 4.20–5.82)
RDW: 24.2 % — ABNORMAL HIGH (ref 11.0–14.6)
WBC: 7.1 10*3/uL (ref 4.0–10.3)
lymph#: 0.5 10*3/uL — ABNORMAL LOW (ref 0.9–3.3)

## 2017-02-15 LAB — LIPID PANEL
CHOLESTEROL: 114 mg/dL (ref 0–200)
HDL: 47 mg/dL (ref 35–70)
LDL CALC: 56 mg/dL
Triglycerides: 53 mg/dL (ref 40–160)

## 2017-02-15 LAB — COMPREHENSIVE METABOLIC PANEL
ALT: 14 U/L (ref 0–55)
AST: 14 U/L (ref 5–34)
Albumin: 3.6 g/dL (ref 3.5–5.0)
Alkaline Phosphatase: 98 U/L (ref 40–150)
Anion Gap: 8 mEq/L (ref 3–11)
BUN: 17.8 mg/dL (ref 7.0–26.0)
CHLORIDE: 103 meq/L (ref 98–109)
CO2: 29 meq/L (ref 22–29)
CREATININE: 1 mg/dL (ref 0.7–1.3)
Calcium: 9.2 mg/dL (ref 8.4–10.4)
EGFR: 74 mL/min/{1.73_m2} — ABNORMAL LOW (ref >90)
GLUCOSE: 103 mg/dL (ref 70–140)
Potassium: 4.3 mEq/L (ref 3.5–5.1)
SODIUM: 140 meq/L (ref 136–145)
TOTAL PROTEIN: 6.6 g/dL (ref 6.4–8.3)
Total Bilirubin: 0.76 mg/dL (ref 0.20–1.20)

## 2017-02-16 ENCOUNTER — Ambulatory Visit
Admission: RE | Admit: 2017-02-16 | Discharge: 2017-02-16 | Disposition: A | Discharge status: Home / Self Care | Payer: Medicare Other | Source: Ambulatory Visit | Attending: Radiation Oncology | Admitting: Radiation Oncology

## 2017-02-16 DIAGNOSIS — Z510 Encounter for antineoplastic radiation therapy: Secondary | ICD-10-CM | POA: Diagnosis not present

## 2017-02-16 DIAGNOSIS — C3492 Malignant neoplasm of unspecified part of left bronchus or lung: Secondary | ICD-10-CM

## 2017-02-16 MED ORDER — RADIAPLEXRX EX GEL
Freq: Once | CUTANEOUS | Status: AC
  Administered 2017-02-16: 10:00:00 via TOPICAL

## 2017-02-17 ENCOUNTER — Ambulatory Visit: Payer: Medicare Other

## 2017-02-17 ENCOUNTER — Ambulatory Visit
Admission: RE | Admit: 2017-02-17 | Discharge: 2017-02-17 | Disposition: A | Discharge status: Home / Self Care | Payer: Medicare Other | Source: Ambulatory Visit | Attending: Radiation Oncology | Admitting: Radiation Oncology

## 2017-02-17 DIAGNOSIS — Z510 Encounter for antineoplastic radiation therapy: Secondary | ICD-10-CM | POA: Diagnosis not present

## 2017-02-18 ENCOUNTER — Ambulatory Visit
Admission: RE | Admit: 2017-02-18 | Discharge: 2017-02-18 | Disposition: A | Discharge status: Home / Self Care | Payer: Medicare Other | Source: Ambulatory Visit | Attending: Radiation Oncology | Admitting: Radiation Oncology

## 2017-02-18 DIAGNOSIS — Z510 Encounter for antineoplastic radiation therapy: Secondary | ICD-10-CM | POA: Diagnosis not present

## 2017-02-19 ENCOUNTER — Ambulatory Visit
Admission: RE | Admit: 2017-02-19 | Discharge: 2017-02-19 | Disposition: A | Discharge status: Home / Self Care | Payer: Medicare Other | Source: Ambulatory Visit | Attending: Radiation Oncology | Admitting: Radiation Oncology

## 2017-02-19 DIAGNOSIS — Z510 Encounter for antineoplastic radiation therapy: Secondary | ICD-10-CM | POA: Diagnosis not present

## 2017-02-22 ENCOUNTER — Ambulatory Visit: Payer: Medicare Other

## 2017-02-22 ENCOUNTER — Ambulatory Visit
Admission: RE | Admit: 2017-02-22 | Discharge: 2017-02-22 | Disposition: A | Discharge status: Home / Self Care | Payer: Medicare Other | Source: Ambulatory Visit | Attending: Radiation Oncology | Admitting: Radiation Oncology

## 2017-02-22 ENCOUNTER — Other Ambulatory Visit (HOSPITAL_BASED_OUTPATIENT_CLINIC_OR_DEPARTMENT_OTHER): Payer: Medicare Other

## 2017-02-22 DIAGNOSIS — C3432 Malignant neoplasm of lower lobe, left bronchus or lung: Secondary | ICD-10-CM | POA: Diagnosis present

## 2017-02-22 DIAGNOSIS — Z510 Encounter for antineoplastic radiation therapy: Secondary | ICD-10-CM | POA: Diagnosis not present

## 2017-02-22 DIAGNOSIS — C3492 Malignant neoplasm of unspecified part of left bronchus or lung: Secondary | ICD-10-CM

## 2017-02-22 LAB — COMPREHENSIVE METABOLIC PANEL
ALT: 11 U/L (ref 0–55)
AST: 12 U/L (ref 5–34)
Albumin: 3.5 g/dL (ref 3.5–5.0)
Alkaline Phosphatase: 76 U/L (ref 40–150)
Anion Gap: 7 mEq/L (ref 3–11)
BUN: 13.7 mg/dL (ref 7.0–26.0)
CHLORIDE: 106 meq/L (ref 98–109)
CO2: 29 meq/L (ref 22–29)
CREATININE: 1.1 mg/dL (ref 0.7–1.3)
Calcium: 9 mg/dL (ref 8.4–10.4)
EGFR: 67 mL/min/{1.73_m2} — AB (ref >90)
GLUCOSE: 108 mg/dL (ref 70–140)
Potassium: 3.8 mEq/L (ref 3.5–5.1)
SODIUM: 141 meq/L (ref 136–145)
TOTAL PROTEIN: 6.3 g/dL — AB (ref 6.4–8.3)
Total Bilirubin: 0.41 mg/dL (ref 0.20–1.20)

## 2017-02-22 LAB — CBC WITH DIFFERENTIAL/PLATELET
BASO%: 0.3 % (ref 0.0–2.0)
Basophils Absolute: 0 10*3/uL (ref 0.0–0.1)
EOS%: 1.4 % (ref 0.0–7.0)
Eosinophils Absolute: 0.1 10*3/uL (ref 0.0–0.5)
HCT: 27.9 % — ABNORMAL LOW (ref 38.4–49.9)
HGB: 9.2 g/dL — ABNORMAL LOW (ref 13.0–17.1)
LYMPH%: 12.1 % — AB (ref 14.0–49.0)
MCH: 29.8 pg (ref 27.2–33.4)
MCHC: 32.9 g/dL (ref 32.0–36.0)
MCV: 90.4 fL (ref 79.3–98.0)
MONO#: 0.4 10*3/uL (ref 0.1–0.9)
MONO%: 9.9 % (ref 0.0–14.0)
NEUT%: 76.3 % — ABNORMAL HIGH (ref 39.0–75.0)
NEUTROS ABS: 3.4 10*3/uL (ref 1.5–6.5)
Platelets: 64 10*3/uL — ABNORMAL LOW (ref 140–400)
RBC: 3.08 10*6/uL — AB (ref 4.20–5.82)
RDW: 25.9 % — ABNORMAL HIGH (ref 11.0–14.6)
WBC: 4.5 10*3/uL (ref 4.0–10.3)
lymph#: 0.5 10*3/uL — ABNORMAL LOW (ref 0.9–3.3)

## 2017-02-23 ENCOUNTER — Ambulatory Visit: Payer: Medicare Other

## 2017-02-23 ENCOUNTER — Ambulatory Visit
Admission: RE | Admit: 2017-02-23 | Discharge: 2017-02-23 | Disposition: A | Discharge status: Home / Self Care | Payer: Medicare Other | Source: Ambulatory Visit | Attending: Radiation Oncology | Admitting: Radiation Oncology

## 2017-02-23 DIAGNOSIS — Z510 Encounter for antineoplastic radiation therapy: Secondary | ICD-10-CM | POA: Diagnosis not present

## 2017-02-24 ENCOUNTER — Encounter: Payer: Self-pay | Admitting: Radiation Oncology

## 2017-02-24 ENCOUNTER — Ambulatory Visit
Admission: RE | Admit: 2017-02-24 | Discharge: 2017-02-24 | Disposition: A | Discharge status: Home / Self Care | Payer: Medicare Other | Source: Ambulatory Visit | Attending: Radiation Oncology | Admitting: Radiation Oncology

## 2017-02-24 VITALS — Wt 158.6 lb

## 2017-02-24 DIAGNOSIS — Z510 Encounter for antineoplastic radiation therapy: Secondary | ICD-10-CM | POA: Diagnosis not present

## 2017-02-24 DIAGNOSIS — C3492 Malignant neoplasm of unspecified part of left bronchus or lung: Secondary | ICD-10-CM

## 2017-02-24 MED ORDER — RADIAPLEXRX EX GEL
Freq: Once | CUTANEOUS | Status: AC
  Administered 2017-02-24: 11:00:00 via TOPICAL

## 2017-02-24 NOTE — Progress Notes (Signed)
  Radiation Oncology         [phone] ________________________________  Name: Christian Munoz MRN: [id number]  Date: 02/24/2017  DOB: [birth year]   End of Treatment Note  Diagnosis:   Limited stage small cell lung cancer in the left lower lung     Indication for treatment:  Curative     Radiation treatment dates:   01/07/17 - 02/24/17  Site/dose:   The primary tumor and involved mediastinal adenopathy were treated to 59.4 Gy in 33 fractions of 1.8 Gy.  Beams/energy:   A four field 3D conformal treatment arrangement was used delivering 6 and 10 MV photons with 2 static and 2 rotational VMAT arcs.  Daily image-guidance CT was used to align the treatment with the targeted volume  Narrative: The patient tolerated radiation treatment relatively well.  The patient experienced some esophagitis characterized as moderate; this was relieved with regular use of Carafate. He reported a productive cough throughout treatment. Patient consistently denied breathing difficulties. The patient experienced some back pain, which he attributed to radiation therapy. The patient also noted fatigue.  Plan: The patient has completed radiation treatment. The patient will return to radiation oncology clinic for routine followup in one month. I advised him to call or return sooner if he has any questions or concerns related to his recovery or treatment.  ________________________________  Sheral Apley. Tammi Klippel, M.D.  This document serves as a record of services personally performed by Tyler Pita, MD. It was created on his behalf by Maryla Morrow, a trained medical scribe. The creation of this record is based on the scribe's personal observations and the provider's statements to them. This document has been checked and approved by the attending provider.

## 2017-02-24 NOTE — Progress Notes (Signed)
Provided patient with additional tube of radiaplex and encouraged him to continue bid use for the next two weeks. Provided patient with one month follow up appointment card. Encouraged patient to contact this RN with future needs.

## 2017-02-26 ENCOUNTER — Ambulatory Visit (HOSPITAL_COMMUNITY)
Admission: RE | Admit: 2017-02-26 | Discharge: 2017-02-26 | Disposition: A | Discharge status: Home / Self Care | Payer: Medicare Other | Source: Ambulatory Visit | Attending: Internal Medicine | Admitting: Internal Medicine

## 2017-02-26 DIAGNOSIS — G893 Neoplasm related pain (acute) (chronic): Secondary | ICD-10-CM | POA: Diagnosis not present

## 2017-02-26 DIAGNOSIS — C3492 Malignant neoplasm of unspecified part of left bronchus or lung: Secondary | ICD-10-CM

## 2017-02-26 DIAGNOSIS — Z5111 Encounter for antineoplastic chemotherapy: Secondary | ICD-10-CM | POA: Diagnosis not present

## 2017-02-26 DIAGNOSIS — I2510 Atherosclerotic heart disease of native coronary artery without angina pectoris: Secondary | ICD-10-CM | POA: Diagnosis not present

## 2017-02-26 DIAGNOSIS — J439 Emphysema, unspecified: Secondary | ICD-10-CM | POA: Insufficient documentation

## 2017-02-26 DIAGNOSIS — I700 Atherosclerosis of aorta: Secondary | ICD-10-CM | POA: Insufficient documentation

## 2017-02-26 MED ORDER — IOPAMIDOL (ISOVUE-300) INJECTION 61%
INTRAVENOUS | Status: AC
  Administered 2017-02-26: 75 mL
  Filled 2017-02-26: qty 75

## 2017-03-01 ENCOUNTER — Ambulatory Visit (HOSPITAL_BASED_OUTPATIENT_CLINIC_OR_DEPARTMENT_OTHER): Payer: Medicare Other

## 2017-03-01 ENCOUNTER — Telehealth: Payer: Self-pay | Admitting: Internal Medicine

## 2017-03-01 ENCOUNTER — Ambulatory Visit (HOSPITAL_BASED_OUTPATIENT_CLINIC_OR_DEPARTMENT_OTHER): Payer: Medicare Other | Admitting: Internal Medicine

## 2017-03-01 ENCOUNTER — Other Ambulatory Visit: Payer: Self-pay | Admitting: Internal Medicine

## 2017-03-01 ENCOUNTER — Encounter: Payer: Self-pay | Admitting: Radiation Oncology

## 2017-03-01 ENCOUNTER — Other Ambulatory Visit (HOSPITAL_BASED_OUTPATIENT_CLINIC_OR_DEPARTMENT_OTHER): Payer: Medicare Other

## 2017-03-01 ENCOUNTER — Encounter: Payer: Self-pay | Admitting: Internal Medicine

## 2017-03-01 ENCOUNTER — Ambulatory Visit: Payer: Medicare Other

## 2017-03-01 VITALS — BP 106/64

## 2017-03-01 VITALS — BP 88/58 | HR 67 | Temp 98.0°F | Resp 18 | Ht 70.0 in | Wt 156.2 lb

## 2017-03-01 DIAGNOSIS — R234 Changes in skin texture: Secondary | ICD-10-CM | POA: Diagnosis not present

## 2017-03-01 DIAGNOSIS — Z5111 Encounter for antineoplastic chemotherapy: Secondary | ICD-10-CM

## 2017-03-01 DIAGNOSIS — D6481 Anemia due to antineoplastic chemotherapy: Secondary | ICD-10-CM | POA: Diagnosis not present

## 2017-03-01 DIAGNOSIS — C3492 Malignant neoplasm of unspecified part of left bronchus or lung: Secondary | ICD-10-CM

## 2017-03-01 DIAGNOSIS — C3432 Malignant neoplasm of lower lobe, left bronchus or lung: Secondary | ICD-10-CM | POA: Diagnosis present

## 2017-03-01 DIAGNOSIS — T451X5A Adverse effect of antineoplastic and immunosuppressive drugs, initial encounter: Secondary | ICD-10-CM

## 2017-03-01 DIAGNOSIS — T300 Burn of unspecified body region, unspecified degree: Secondary | ICD-10-CM | POA: Diagnosis not present

## 2017-03-01 DIAGNOSIS — Z95828 Presence of other vascular implants and grafts: Secondary | ICD-10-CM

## 2017-03-01 DIAGNOSIS — G893 Neoplasm related pain (acute) (chronic): Secondary | ICD-10-CM

## 2017-03-01 LAB — COMPREHENSIVE METABOLIC PANEL
ALK PHOS: 75 U/L (ref 40–150)
ALT: 11 U/L (ref 0–55)
AST: 15 U/L (ref 5–34)
Albumin: 3.7 g/dL (ref 3.5–5.0)
Anion Gap: 10 mEq/L (ref 3–11)
BILIRUBIN TOTAL: 0.5 mg/dL (ref 0.20–1.20)
BUN: 12.3 mg/dL (ref 7.0–26.0)
CALCIUM: 9.3 mg/dL (ref 8.4–10.4)
CO2: 26 mEq/L (ref 22–29)
CREATININE: 1.1 mg/dL (ref 0.7–1.3)
Chloride: 106 mEq/L (ref 98–109)
EGFR: 65 mL/min/{1.73_m2} — AB (ref >90)
Glucose: 110 mg/dl (ref 70–140)
Potassium: 4.1 mEq/L (ref 3.5–5.1)
Sodium: 142 mEq/L (ref 136–145)
TOTAL PROTEIN: 6.4 g/dL (ref 6.4–8.3)

## 2017-03-01 LAB — CBC WITH DIFFERENTIAL/PLATELET
BASO%: 0.2 % (ref 0.0–2.0)
BASOS ABS: 0 10*3/uL (ref 0.0–0.1)
EOS%: 1.1 % (ref 0.0–7.0)
Eosinophils Absolute: 0.1 10*3/uL (ref 0.0–0.5)
HEMATOCRIT: 30.4 % — AB (ref 38.4–49.9)
HGB: 9.6 g/dL — ABNORMAL LOW (ref 13.0–17.1)
LYMPH%: 14.6 % (ref 14.0–49.0)
MCH: 29.7 pg (ref 27.2–33.4)
MCHC: 31.6 g/dL — ABNORMAL LOW (ref 32.0–36.0)
MCV: 94.1 fL (ref 79.3–98.0)
MONO#: 0.6 10*3/uL (ref 0.1–0.9)
MONO%: 11.1 % (ref 0.0–14.0)
NEUT%: 73 % (ref 39.0–75.0)
NEUTROS ABS: 4.2 10*3/uL (ref 1.5–6.5)
Platelets: 163 10*3/uL (ref 140–400)
RBC: 3.23 10*6/uL — ABNORMAL LOW (ref 4.20–5.82)
RDW: 23 % — AB (ref 11.0–14.6)
WBC: 5.7 10*3/uL (ref 4.0–10.3)
lymph#: 0.8 10*3/uL — ABNORMAL LOW (ref 0.9–3.3)

## 2017-03-01 MED ORDER — PALONOSETRON HCL INJECTION 0.25 MG/5ML
INTRAVENOUS | Status: AC
  Filled 2017-03-01: qty 5

## 2017-03-01 MED ORDER — SODIUM CHLORIDE 0.9 % IV SOLN
402.5000 mg | Freq: Once | INTRAVENOUS | Status: AC
  Administered 2017-03-01: 400 mg via INTRAVENOUS
  Filled 2017-03-01: qty 40

## 2017-03-01 MED ORDER — DEXAMETHASONE SODIUM PHOSPHATE 10 MG/ML IJ SOLN
INTRAMUSCULAR | Status: AC
  Filled 2017-03-01: qty 1

## 2017-03-01 MED ORDER — SODIUM CHLORIDE 0.9% FLUSH
10.0000 mL | INTRAVENOUS | Status: DC | PRN
  Administered 2017-03-01: 10 mL via INTRAVENOUS
  Filled 2017-03-01: qty 10

## 2017-03-01 MED ORDER — PALONOSETRON HCL INJECTION 0.25 MG/5ML
0.2500 mg | Freq: Once | INTRAVENOUS | Status: AC
  Administered 2017-03-01: 0.25 mg via INTRAVENOUS

## 2017-03-01 MED ORDER — SODIUM CHLORIDE 0.9 % IV SOLN
Freq: Once | INTRAVENOUS | Status: AC
  Administered 2017-03-01: 12:00:00 via INTRAVENOUS

## 2017-03-01 MED ORDER — DEXAMETHASONE SODIUM PHOSPHATE 10 MG/ML IJ SOLN
10.0000 mg | Freq: Once | INTRAMUSCULAR | Status: AC
  Administered 2017-03-01: 10 mg via INTRAVENOUS

## 2017-03-01 MED ORDER — HEPARIN SOD (PORK) LOCK FLUSH 100 UNIT/ML IV SOLN
500.0000 [IU] | Freq: Once | INTRAVENOUS | Status: AC | PRN
  Administered 2017-03-01: 500 [IU]
  Filled 2017-03-01: qty 5

## 2017-03-01 MED ORDER — SODIUM CHLORIDE 0.9% FLUSH
10.0000 mL | INTRAVENOUS | Status: DC | PRN
  Administered 2017-03-01: 10 mL
  Filled 2017-03-01: qty 10

## 2017-03-01 MED ORDER — SODIUM CHLORIDE 0.9 % IV SOLN
100.0000 mg/m2 | Freq: Once | INTRAVENOUS | Status: AC
  Administered 2017-03-01: 190 mg via INTRAVENOUS
  Filled 2017-03-01: qty 9.5

## 2017-03-01 NOTE — Progress Notes (Signed)
Received voicemail message from [place], RN for Dr. Julien Nordmann about patient's skin reaction to radiation. Visited with patient in med onc exam room. Area of dry desquamation noted on his back with a dime size area within that same area which is moist. Provided patient with biafine cream and neosporin. Instructed the patient to apply the biafine cream to the entire area of breakdown and the neosporin to the area of moist desquamation. Encouraged patient to do this until the biafine tube was emptying then revert back to Radiaplex until it is used up. Will assess skin again at follow up appointment. Patient and his daughter verbalized understanding.

## 2017-03-01 NOTE — Patient Instructions (Signed)

## 2017-03-01 NOTE — Patient Instructions (Signed)
[facility] Discharge Instructions for Patients Receiving Chemotherapy  Today you received the following chemotherapy agents:  Etoposide, Carboplatin  To help prevent nausea and vomiting after your treatment, we encourage you to take your nausea medication as prescribed.   If you develop nausea and vomiting that is not controlled by your nausea medication, call the clinic.   BELOW ARE SYMPTOMS THAT SHOULD BE REPORTED IMMEDIATELY:  *FEVER GREATER THAN 100.5 F  *CHILLS WITH OR WITHOUT FEVER  NAUSEA AND VOMITING THAT IS NOT CONTROLLED WITH YOUR NAUSEA MEDICATION  *UNUSUAL SHORTNESS OF BREATH  *UNUSUAL BRUISING OR BLEEDING  TENDERNESS IN MOUTH AND THROAT WITH OR WITHOUT PRESENCE OF ULCERS  *URINARY PROBLEMS  *BOWEL PROBLEMS  UNUSUAL RASH Items with * indicate a potential emergency and should be followed up as soon as possible.  Feel free to call the clinic you have any questions or concerns. The clinic phone number is [phone].  Please show the [place] at check-in to the Emergency Department and triage nurse.

## 2017-03-01 NOTE — Progress Notes (Signed)
[facility] Telephone:(336) [phone]   Fax:([phone]  OFFICE PROGRESS NOTE  [name], NP [place] [address]  DIAGNOSIS: Limited stage, Stage IIIA (T3, N2, M0) small cell lung cancer diagnosed in February 2018 and presented with left lower lobe lung mass in addition to subcarinal lymphadenopathy.  PRIOR THERAPY: None  CURRENT THERAPY: Systemic chemotherapy with carboplatin for AUC of 5 on day 1 and etoposide 100 MG/M2 on days 1, 2 and 3 with Neulasta support. This will be concurrent with radiotherapy. Status post 4 cycles.  INTERVAL HISTORY: Christian Munoz 82 y.o. male returns to the clinic today for follow-up visit accompanied by his daughter. The patient is tolerating his treatment with carboplatin and etoposide fairly well with no significant adverse effects. He is status post 4 cycles. He has desquamation and burns from the radiation on the back. He denied having any fever or chills. He has no nausea, vomiting, diarrhea or constipation. He denied having any chest pain, shortness of breath, cough or hemoptysis. The patient completed the course of radiotherapy. He had repeat CT scan of the chest performed recently and he is here for evaluation and discussion of his scan results.  MEDICAL HISTORY: Past Medical History:  Diagnosis Date  . Allergic rhinitis due to pollen   . Anginal pain ([place])    Past medical history " does not seem to be a problem at all now."  . Cancer associated pain 02/08/2017  . Coronary atherosclerosis of native coronary artery   . Coronary atherosclerosis of native coronary artery   . Depressive disorder, not elsewhere classified   . Dysuria 01/18/2017  . Encounter for antineoplastic chemotherapy 11/30/2016  . GERD (gastroesophageal reflux disease)   . Goals of care, counseling/discussion 11/30/2016  . Headache    PMH: Migraines  . Herpes genitalia   . Hypertrophy of prostate without urinary obstruction and  other lower urinary tract symptoms (LUTS)   . Hypertrophy of prostate without urinary obstruction and other lower urinary tract symptoms (LUTS)   . Lack of coordination   . Lack of coordination   . Lumbago   . Nausea and vomiting 11/30/2016  . Osteoarthrosis, unspecified whether generalized or localized, unspecified site   . Osteoarthrosis, unspecified whether generalized or localized, unspecified site   . Other and unspecified hyperlipidemia   . Other malaise and fatigue   . Pneumonia   . Reflux esophagitis   . Skin cancer   . Sleep apnea    does not wear CPAP  . Syncope and collapse   . Urinary frequency   . Vitamin D deficiency     ALLERGIES:  is allergic to chantix [varenicline] and zoloft [sertraline hcl].  MEDICATIONS:  Current Outpatient Prescriptions  Medication Sig Dispense Refill  . aspirin 81 MG tablet Take 81 mg by mouth daily. Take one tablet once a day    . escitalopram (LEXAPRO) 5 MG tablet TAKE 1 TABLET(5 MG) BY MOUTH EVERY MORNING 30 tablet 2  . fish oil-omega-3 fatty acids 1000 MG capsule Take 2 g by mouth daily. Take one tablet twice a day    . metoprolol tartrate (LOPRESSOR) 25 MG tablet Take 0.5 tablets (12.5 mg total) by mouth 2 (two) times daily. 90 tablet 1  . Multiple Vitamins-Minerals (MULTIVITAMIN WITH MINERALS) tablet Take 1 tablet by mouth daily.    Marland Kitchen oxyCODONE-acetaminophen (PERCOCET/ROXICET) 5-325 MG tablet Take 1 tablet by mouth every 6 (six) hours as needed for severe pain. [place]  tablet 0  . pantoprazole (PROTONIX) 20 MG tablet TAKE 1 TABLET(20 MG) BY MOUTH DAILY 90 tablet 0  . prochlorperazine (COMPAZINE) 10 MG tablet Take 1 tablet (10 mg total) by mouth every 6 (six) hours as needed for nausea or vomiting. 30 tablet 0  . QUEtiapine (SEROQUEL) 25 MG tablet Take 1 tablet (25 mg total) by mouth at bedtime. 1/2- 1 pill po qhs 30 tablet 2  . simvastatin (ZOCOR) 20 MG tablet TAKE 1 TABLET(20 MG) BY MOUTH DAILY 90 tablet 2  . sucralfate (CARAFATE) 1 g tablet  Take 1 tablet (1 g total) by mouth 4 (four) times daily -  with meals and at bedtime. 5 min before meals for radiation induced esophagitis 120 tablet 2  . tamsulosin (FLOMAX) 0.4 MG CAPS capsule TAKE 1 CAPSULE(0.4 MG) BY MOUTH DAILY 90 capsule 0  . Wound Cleansers (RADIAPLEX EX) Apply topically.     No current facility-administered medications for this visit.     SURGICAL HISTORY:  Past Surgical History:  Procedure Laterality Date  . CARDIAC CATHETERIZATION    . COLONOSCOPY    . EYE SURGERY    . IR GENERIC HISTORICAL  12/04/2016   IR US GUIDE VASC ACCESS RIGHT 12/04/2016 Greggory Keen, MD [facility]  . IR GENERIC HISTORICAL  12/04/2016   IR FLUORO GUIDE PORT INSERTION RIGHT 12/04/2016 Greggory Keen, MD [facility]  . poly vocal cords  1985  . SKIN CANCER EXCISION  2011  . TONSILLECTOMY    . VASECTOMY    . VIDEO BRONCHOSCOPY WITH ENDOBRONCHIAL ULTRASOUND N/A 11/20/2016   Procedure: VIDEO BRONCHOSCOPY WITH ENDOBRONCHIAL ULTRASOUND;  Surgeon: Collene Gobble, MD;  Location: [facility];  Service: Thoracic;  Laterality: N/A;    REVIEW OF SYSTEMS:  Constitutional: positive for fatigue Eyes: negative Ears, nose, mouth, throat, and face: negative Respiratory: negative Cardiovascular: negative Gastrointestinal: negative Genitourinary:negative Integument/breast: negative Hematologic/lymphatic: negative Musculoskeletal:negative Neurological: negative Behavioral/Psych: negative Endocrine: negative Allergic/Immunologic: negative   PHYSICAL EXAMINATION: General appearance: alert, cooperative, fatigued and no distress Head: Normocephalic, without obvious abnormality, atraumatic Neck: no adenopathy, no JVD, supple, symmetrical, trachea midline and thyroid not enlarged, symmetric, no tenderness/mass/nodules Lymph nodes: Cervical, supraclavicular, and axillary nodes normal. Resp: clear to auscultation bilaterally Back: symmetric, no curvature. ROM normal. No CVA tenderness. Cardio: regular rate  and rhythm, S1, S2 normal, no murmur, click, rub or gallop GI: soft, non-tender; bowel sounds normal; no masses,  no organomegaly Extremities: extremities normal, atraumatic, no cyanosis or edema Neurologic: Alert and oriented X 3, normal strength and tone. Normal symmetric reflexes. Normal coordination and gait  ECOG PERFORMANCE STATUS: 1 - Symptomatic but completely ambulatory  Blood pressure (!) 88/58, pulse 67, temperature 98 F (36.7 C), temperature source Oral, resp. rate 18, height '5\' 10"'$  (1.778 m), weight 156 lb 3.2 oz (70.9 kg), SpO2 94 %.  LABORATORY DATA: Lab Results  Component Value Date   WBC 5.7 03/01/2017   HGB 9.6 (L) 03/01/2017   HCT 30.4 (L) 03/01/2017   MCV 94.1 03/01/2017   PLT 163 03/01/2017      Chemistry      Component Value Date/Time   NA 142 03/01/2017 0858   K 4.1 03/01/2017 0858   CL 105 12/04/2016 0814   CO2 26 03/01/2017 0858   BUN 12.3 03/01/2017 0858   CREATININE 1.1 03/01/2017 0858      Component Value Date/Time   CALCIUM 9.3 03/01/2017 0858   ALKPHOS 75 03/01/2017 0858   AST 15 03/01/2017 0858   ALT 11  03/01/2017 0858   BILITOT 0.50 03/01/2017 0858       RADIOGRAPHIC STUDIES: Ct Chest W Contrast  Addendum Date: 02/26/2017   ADDENDUM REPORT: 02/26/2017 13:55 ADDENDUM: Volume Correction:  75 mL Isovue IV contrast. Electronically Signed   By: Suzy Bouchard M.D.   On: 02/26/2017 13:55   Result Date: 02/26/2017 CLINICAL DATA:  Restaging lung cancer. Initial diagnosis over 2018. Radiation therapy completed Feb 26, 2017 EXAM: CT CHEST WITH CONTRAST TECHNIQUE: Multidetector CT imaging of the chest was performed during intravenous contrast administration. CONTRAST:  100 cc Isovue COMPARISON:  CT 01/14/2017, PET-CT 11/29/2016 FINDINGS: Cardiovascular: Coronary artery calcification and aortic atherosclerotic calcification. Mediastinum/Nodes: No axillary or supraclavicular adenopathy. No mediastinal or hilar adenopathy. Port in the RIGHT chest wall  with tip in distal SVC. No mediastinal adenopathy.  No pericardial fluid.  Esophagus normal. Lungs/Pleura: Interval contraction of the LEFT lower lobe mass measuring 38 x 25 mm decreased from ill-defined consolidative mass of 65 x 54 mm on most recent CT scan. Post obstructive collapse in the LEFT lower lobe is improved. No new nodularity.  centrilobular emphysema in  the upper lobes. Upper Abdomen: Limited view of the liver, kidneys, pancreas are unremarkable. Normal adrenal glands. Multiple benign hepatic cysts demonstrated. No new nodularity Musculoskeletal: No aggressive osseous lesion. IMPRESSION: 1. Contraction of the LEFT lower lobe mass and improvement in postobstructive collapse. 2. No new pulmonary nodules. 3. No mediastinal adenopathy. 4. Emphysema. 5. Coronary artery calcification and aortic atherosclerotic calcification. Electronically Signed: By: Suzy Bouchard M.D. On: 02/26/2017 13:40    ASSESSMENT AND PLAN:  This is a very pleasant 82 years old white male with limited stage small cell lung cancer status post 4 cycles of systemic chemotherapy with carboplatin and etoposide concurrent with radiation. He tolerated his systemic chemotherapy fairly well. He has some scarring and desquamation and radiation induced skin burn currently treated with Radiaplex EX.  The patient had repeat CT scan of the chest performed recently. I personally and independently reviewed the scan images and discuss the results and showed the images to the patient and his daughter. I recommended for the patient to continue his current treatment with carboplatin and etoposide for 2 more cycles. He will proceed with cycle #5 today. For the skin burns he will continue his current treatment with local cream with Radiaplex EX. For pain management she will continue on Percocet as needed. For the chemotherapy-induced anemia I will continue to monitor his hemoglobin and hematocrit closely and consider the patient for  transfusion if needed. I will see him back for follow-up visit in 3 weeks for evaluation before starting cycle #6. The patient was advised to call immediately if he has any concerning symptoms in the interval. The patient voices understanding of current disease status and treatment options and is in agreement with the current care plan. All questions were answered. The patient knows to call the clinic with any problems, questions or concerns. We can certainly see the patient much sooner if necessary.  Disclaimer: This note was dictated with voice recognition software. Similar sounding words can inadvertently be transcribed and may not be corrected upon review.

## 2017-03-01 NOTE — Telephone Encounter (Signed)
Gave patient avs report and appointments for May and June.  °

## 2017-03-02 ENCOUNTER — Ambulatory Visit (HOSPITAL_BASED_OUTPATIENT_CLINIC_OR_DEPARTMENT_OTHER): Payer: Medicare Other

## 2017-03-02 VITALS — BP 107/59 | HR 73 | Temp 97.9°F | Resp 18

## 2017-03-02 DIAGNOSIS — C3432 Malignant neoplasm of lower lobe, left bronchus or lung: Secondary | ICD-10-CM

## 2017-03-02 DIAGNOSIS — Z5111 Encounter for antineoplastic chemotherapy: Secondary | ICD-10-CM | POA: Diagnosis present

## 2017-03-02 DIAGNOSIS — C3492 Malignant neoplasm of unspecified part of left bronchus or lung: Secondary | ICD-10-CM

## 2017-03-02 MED ORDER — DEXAMETHASONE SODIUM PHOSPHATE 10 MG/ML IJ SOLN
10.0000 mg | Freq: Once | INTRAMUSCULAR | Status: AC
  Administered 2017-03-02: 10 mg via INTRAVENOUS

## 2017-03-02 MED ORDER — SODIUM CHLORIDE 0.9% FLUSH
10.0000 mL | INTRAVENOUS | Status: DC | PRN
Start: 2017-03-02 — End: 2017-03-02
  Administered 2017-03-02: 10 mL
  Filled 2017-03-02: qty 10

## 2017-03-02 MED ORDER — DEXAMETHASONE SODIUM PHOSPHATE 10 MG/ML IJ SOLN
INTRAMUSCULAR | Status: AC
  Filled 2017-03-02: qty 1

## 2017-03-02 MED ORDER — SODIUM CHLORIDE 0.9 % IV SOLN
100.0000 mg/m2 | Freq: Once | INTRAVENOUS | Status: AC
  Administered 2017-03-02: 190 mg via INTRAVENOUS
  Filled 2017-03-02: qty 9.5

## 2017-03-02 MED ORDER — SODIUM CHLORIDE 0.9 % IV SOLN
Freq: Once | INTRAVENOUS | Status: AC
  Administered 2017-03-02: 14:00:00 via INTRAVENOUS

## 2017-03-02 MED ORDER — HEPARIN SOD (PORK) LOCK FLUSH 100 UNIT/ML IV SOLN
500.0000 [IU] | Freq: Once | INTRAVENOUS | Status: AC | PRN
  Administered 2017-03-02: 500 [IU]
  Filled 2017-03-02: qty 5

## 2017-03-02 NOTE — Patient Instructions (Signed)
[facility] Discharge Instructions for Patients Receiving Chemotherapy  Today you received the following chemotherapy agents Etoposide.   To help prevent nausea and vomiting after your treatment, we encourage you to take your nausea medication as prescribed.   If you develop nausea and vomiting that is not controlled by your nausea medication, call the clinic.   BELOW ARE SYMPTOMS THAT SHOULD BE REPORTED IMMEDIATELY:  *FEVER GREATER THAN 100.5 F  *CHILLS WITH OR WITHOUT FEVER  NAUSEA AND VOMITING THAT IS NOT CONTROLLED WITH YOUR NAUSEA MEDICATION  *UNUSUAL SHORTNESS OF BREATH  *UNUSUAL BRUISING OR BLEEDING  TENDERNESS IN MOUTH AND THROAT WITH OR WITHOUT PRESENCE OF ULCERS  *URINARY PROBLEMS  *BOWEL PROBLEMS  UNUSUAL RASH Items with * indicate a potential emergency and should be followed up as soon as possible.  Feel free to call the clinic you have any questions or concerns. The clinic phone number is [phone].  Please show the CHEMO ALERT CARD at check-in to the Emergency Department and triage nurse.   

## 2017-03-03 ENCOUNTER — Ambulatory Visit (HOSPITAL_BASED_OUTPATIENT_CLINIC_OR_DEPARTMENT_OTHER): Payer: Medicare Other

## 2017-03-03 VITALS — BP 123/65 | HR 61 | Temp 98.1°F | Resp 18

## 2017-03-03 DIAGNOSIS — C3492 Malignant neoplasm of unspecified part of left bronchus or lung: Secondary | ICD-10-CM

## 2017-03-03 DIAGNOSIS — C3432 Malignant neoplasm of lower lobe, left bronchus or lung: Secondary | ICD-10-CM | POA: Diagnosis not present

## 2017-03-03 DIAGNOSIS — Z5111 Encounter for antineoplastic chemotherapy: Secondary | ICD-10-CM | POA: Diagnosis present

## 2017-03-03 MED ORDER — DEXAMETHASONE SODIUM PHOSPHATE 10 MG/ML IJ SOLN
10.0000 mg | Freq: Once | INTRAMUSCULAR | Status: AC
  Administered 2017-03-03: 10 mg via INTRAVENOUS

## 2017-03-03 MED ORDER — HEPARIN SOD (PORK) LOCK FLUSH 100 UNIT/ML IV SOLN
500.0000 [IU] | Freq: Once | INTRAVENOUS | Status: AC | PRN
  Administered 2017-03-03: 500 [IU]
  Filled 2017-03-03: qty 5

## 2017-03-03 MED ORDER — SODIUM CHLORIDE 0.9 % IV SOLN
Freq: Once | INTRAVENOUS | Status: AC
  Administered 2017-03-03: 09:00:00 via INTRAVENOUS

## 2017-03-03 MED ORDER — DEXAMETHASONE SODIUM PHOSPHATE 10 MG/ML IJ SOLN
INTRAMUSCULAR | Status: AC
  Filled 2017-03-03: qty 1

## 2017-03-03 MED ORDER — SODIUM CHLORIDE 0.9% FLUSH
10.0000 mL | INTRAVENOUS | Status: DC | PRN
  Administered 2017-03-03: 10 mL
  Filled 2017-03-03: qty 10

## 2017-03-03 MED ORDER — SODIUM CHLORIDE 0.9 % IV SOLN
100.0000 mg/m2 | Freq: Once | INTRAVENOUS | Status: AC
  Administered 2017-03-03: 190 mg via INTRAVENOUS
  Filled 2017-03-03: qty 9.5

## 2017-03-03 NOTE — Patient Instructions (Signed)
[facility] Discharge Instructions for Patients Receiving Chemotherapy  Today you received the following chemotherapy agents:  Etoposide To help prevent nausea and vomiting after your treatment, we encourage you to take your nausea medication as prescribed.   If you develop nausea and vomiting that is not controlled by your nausea medication, call the clinic.   BELOW ARE SYMPTOMS THAT SHOULD BE REPORTED IMMEDIATELY:  *FEVER GREATER THAN 100.5 F  *CHILLS WITH OR WITHOUT FEVER  NAUSEA AND VOMITING THAT IS NOT CONTROLLED WITH YOUR NAUSEA MEDICATION  *UNUSUAL SHORTNESS OF BREATH  *UNUSUAL BRUISING OR BLEEDING  TENDERNESS IN MOUTH AND THROAT WITH OR WITHOUT PRESENCE OF ULCERS  *URINARY PROBLEMS  *BOWEL PROBLEMS  UNUSUAL RASH Items with * indicate a potential emergency and should be followed up as soon as possible.  Feel free to call the clinic you have any questions or concerns. The clinic phone number is [phone].  Please show the [facility] at check-in to the Emergency Department and triage nurse.

## 2017-03-05 ENCOUNTER — Ambulatory Visit: Payer: Self-pay

## 2017-03-05 ENCOUNTER — Ambulatory Visit (HOSPITAL_BASED_OUTPATIENT_CLINIC_OR_DEPARTMENT_OTHER): Payer: Medicare Other

## 2017-03-05 DIAGNOSIS — C3492 Malignant neoplasm of unspecified part of left bronchus or lung: Secondary | ICD-10-CM

## 2017-03-05 DIAGNOSIS — C3432 Malignant neoplasm of lower lobe, left bronchus or lung: Secondary | ICD-10-CM

## 2017-03-05 DIAGNOSIS — Z5189 Encounter for other specified aftercare: Secondary | ICD-10-CM

## 2017-03-05 MED ORDER — PEGFILGRASTIM INJECTION 6 MG/0.6ML ~~LOC~~
6.0000 mg | PREFILLED_SYRINGE | Freq: Once | SUBCUTANEOUS | Status: AC
  Administered 2017-03-05: 6 mg via SUBCUTANEOUS
  Filled 2017-03-05: qty 0.6

## 2017-03-05 NOTE — Patient Instructions (Signed)
Pegfilgrastim injection What is this medicine? PEGFILGRASTIM (PEG fil gra stim) is a long-acting granulocyte colony-stimulating factor that stimulates the growth of neutrophils, a type of white blood cell important in the body's fight against infection. It is used to reduce the incidence of fever and infection in patients with certain types of cancer who are receiving chemotherapy that affects the bone marrow, and to increase survival after being exposed to high doses of radiation. This medicine may be used for other purposes; ask your health care provider or pharmacist if you have questions. COMMON BRAND NAME(S): Neulasta What should I tell my health care provider before I take this medicine? They need to know if you have any of these conditions: -kidney disease -latex allergy -ongoing radiation therapy -sickle cell disease -skin reactions to acrylic adhesives (On-Body Injector only) -an unusual or allergic reaction to pegfilgrastim, filgrastim, other medicines, foods, dyes, or preservatives -pregnant or trying to get pregnant -breast-feeding How should I use this medicine? This medicine is for injection under the skin. If you get this medicine at home, you will be taught how to prepare and give the pre-filled syringe or how to use the On-body Injector. Refer to the patient Instructions for Use for detailed instructions. Use exactly as directed. Tell your healthcare provider immediately if you suspect that the On-body Injector may not have performed as intended or if you suspect the use of the On-body Injector resulted in a missed or partial dose. It is important that you put your used needles and syringes in a special sharps container. Do not put them in a trash can. If you do not have a sharps container, call your pharmacist or healthcare provider to get one. Talk to your pediatrician regarding the use of this medicine in children. While this drug may be prescribed for selected conditions,  precautions do apply. Overdosage: If you think you have taken too much of this medicine contact a poison control center or emergency room at once. NOTE: This medicine is only for you. Do not share this medicine with others. What if I miss a dose? It is important not to miss your dose. Call your doctor or health care professional if you miss your dose. If you miss a dose due to an On-body Injector failure or leakage, a new dose should be administered as soon as possible using a single prefilled syringe for manual use. What may interact with this medicine? Interactions have not been studied. Give your health care provider a list of all the medicines, herbs, non-prescription drugs, or dietary supplements you use. Also tell them if you smoke, drink alcohol, or use illegal drugs. Some items may interact with your medicine. This list may not describe all possible interactions. Give your health care provider a list of all the medicines, herbs, non-prescription drugs, or dietary supplements you use. Also tell them if you smoke, drink alcohol, or use illegal drugs. Some items may interact with your medicine. What should I watch for while using this medicine? You may need blood work done while you are taking this medicine. If you are going to need a MRI, CT scan, or other procedure, tell your doctor that you are using this medicine (On-Body Injector only). What side effects may I notice from receiving this medicine? Side effects that you should report to your doctor or health care professional as soon as possible: -allergic reactions like skin rash, itching or hives, swelling of the face, lips, or tongue -dizziness -fever -pain, redness, or irritation at site   where injected -pinpoint red spots on the skin -red or dark-brown urine -shortness of breath or breathing problems -stomach or side pain, or pain at the shoulder -swelling -tiredness -trouble passing urine or change in the amount of urine Side  effects that usually do not require medical attention (report to your doctor or health care professional if they continue or are bothersome): -bone pain -muscle pain This list may not describe all possible side effects. Call your doctor for medical advice about side effects. You may report side effects to FDA at 1-800-FDA-1088. Where should I keep my medicine? Keep out of the reach of children. Store pre-filled syringes in a refrigerator between 2 and 8 degrees C (36 and 46 degrees F). Do not freeze. Keep in carton to protect from light. Throw away this medicine if it is left out of the refrigerator for more than 48 hours. Throw away any unused medicine after the expiration date. NOTE: This sheet is a summary. It may not cover all possible information. If you have questions about this medicine, talk to your doctor, pharmacist, or health care provider.  2018 [employer] (2016-09-24 12:58:03)  

## 2017-03-09 ENCOUNTER — Other Ambulatory Visit (HOSPITAL_BASED_OUTPATIENT_CLINIC_OR_DEPARTMENT_OTHER): Payer: Medicare Other

## 2017-03-09 DIAGNOSIS — C3432 Malignant neoplasm of lower lobe, left bronchus or lung: Secondary | ICD-10-CM | POA: Diagnosis present

## 2017-03-09 DIAGNOSIS — C3492 Malignant neoplasm of unspecified part of left bronchus or lung: Secondary | ICD-10-CM

## 2017-03-09 LAB — COMPREHENSIVE METABOLIC PANEL
ALT: 9 U/L (ref 0–55)
AST: 12 U/L (ref 5–34)
Albumin: 3.6 g/dL (ref 3.5–5.0)
Alkaline Phosphatase: 94 U/L (ref 40–150)
Anion Gap: 8 mEq/L (ref 3–11)
BILIRUBIN TOTAL: 0.41 mg/dL (ref 0.20–1.20)
BUN: 20.2 mg/dL (ref 7.0–26.0)
CHLORIDE: 105 meq/L (ref 98–109)
CO2: 30 meq/L — AB (ref 22–29)
CREATININE: 1.2 mg/dL (ref 0.7–1.3)
Calcium: 9.2 mg/dL (ref 8.4–10.4)
EGFR: 59 mL/min/{1.73_m2} — ABNORMAL LOW (ref >90)
GLUCOSE: 83 mg/dL (ref 70–140)
Potassium: 4 mEq/L (ref 3.5–5.1)
Sodium: 143 mEq/L (ref 136–145)
TOTAL PROTEIN: 6.3 g/dL — AB (ref 6.4–8.3)

## 2017-03-09 LAB — CBC WITH DIFFERENTIAL/PLATELET
BASO%: 0.4 % (ref 0.0–2.0)
Basophils Absolute: 0 10*3/uL (ref 0.0–0.1)
EOS%: 0.7 % (ref 0.0–7.0)
Eosinophils Absolute: 0 10*3/uL (ref 0.0–0.5)
HCT: 27.6 % — ABNORMAL LOW (ref 38.4–49.9)
HGB: 9.2 g/dL — ABNORMAL LOW (ref 13.0–17.1)
LYMPH%: 9.3 % — AB (ref 14.0–49.0)
MCH: 31.2 pg (ref 27.2–33.4)
MCHC: 33.4 g/dL (ref 32.0–36.0)
MCV: 93.2 fL (ref 79.3–98.0)
MONO#: 0.5 10*3/uL (ref 0.1–0.9)
MONO%: 12.5 % (ref 0.0–14.0)
NEUT%: 77.1 % — ABNORMAL HIGH (ref 39.0–75.0)
NEUTROS ABS: 3.2 10*3/uL (ref 1.5–6.5)
PLATELETS: 150 10*3/uL (ref 140–400)
RBC: 2.96 10*6/uL — AB (ref 4.20–5.82)
RDW: 24.1 % — ABNORMAL HIGH (ref 11.0–14.6)
WBC: 4.2 10*3/uL (ref 4.0–10.3)
lymph#: 0.4 10*3/uL — ABNORMAL LOW (ref 0.9–3.3)

## 2017-03-11 ENCOUNTER — Other Ambulatory Visit: Payer: Self-pay | Admitting: Nurse Practitioner

## 2017-03-11 ENCOUNTER — Other Ambulatory Visit: Payer: Self-pay | Admitting: Internal Medicine

## 2017-03-11 DIAGNOSIS — E785 Hyperlipidemia, unspecified: Secondary | ICD-10-CM

## 2017-03-15 ENCOUNTER — Other Ambulatory Visit (HOSPITAL_BASED_OUTPATIENT_CLINIC_OR_DEPARTMENT_OTHER): Payer: Medicare Other

## 2017-03-15 DIAGNOSIS — C3492 Malignant neoplasm of unspecified part of left bronchus or lung: Secondary | ICD-10-CM

## 2017-03-15 DIAGNOSIS — C3432 Malignant neoplasm of lower lobe, left bronchus or lung: Secondary | ICD-10-CM | POA: Diagnosis not present

## 2017-03-15 LAB — CBC WITH DIFFERENTIAL/PLATELET
BASO%: 0.3 % (ref 0.0–2.0)
Basophils Absolute: 0 10*3/uL (ref 0.0–0.1)
EOS%: 0.7 % (ref 0.0–7.0)
Eosinophils Absolute: 0.1 10*3/uL (ref 0.0–0.5)
HCT: 28.7 % — ABNORMAL LOW (ref 38.4–49.9)
HGB: 9.4 g/dL — ABNORMAL LOW (ref 13.0–17.1)
LYMPH%: 8.4 % — AB (ref 14.0–49.0)
MCH: 31.4 pg (ref 27.2–33.4)
MCHC: 32.8 g/dL (ref 32.0–36.0)
MCV: 95.9 fL (ref 79.3–98.0)
MONO#: 0.8 10*3/uL (ref 0.1–0.9)
MONO%: 8.5 % (ref 0.0–14.0)
NEUT%: 82.1 % — ABNORMAL HIGH (ref 39.0–75.0)
NEUTROS ABS: 7.3 10*3/uL — AB (ref 1.5–6.5)
Platelets: 77 10*3/uL — ABNORMAL LOW (ref 140–400)
RBC: 3 10*6/uL — AB (ref 4.20–5.82)
RDW: 25 % — ABNORMAL HIGH (ref 11.0–14.6)
WBC: 8.9 10*3/uL (ref 4.0–10.3)
lymph#: 0.7 10*3/uL — ABNORMAL LOW (ref 0.9–3.3)

## 2017-03-15 LAB — COMPREHENSIVE METABOLIC PANEL
ALT: 11 U/L (ref 0–55)
AST: 15 U/L (ref 5–34)
Albumin: 3.7 g/dL (ref 3.5–5.0)
Alkaline Phosphatase: 87 U/L (ref 40–150)
Anion Gap: 6 mEq/L (ref 3–11)
BILIRUBIN TOTAL: 0.34 mg/dL (ref 0.20–1.20)
BUN: 11.8 mg/dL (ref 7.0–26.0)
CHLORIDE: 106 meq/L (ref 98–109)
CO2: 29 meq/L (ref 22–29)
CREATININE: 1.1 mg/dL (ref 0.7–1.3)
Calcium: 9.1 mg/dL (ref 8.4–10.4)
EGFR: 64 mL/min/{1.73_m2} — AB (ref >90)
GLUCOSE: 98 mg/dL (ref 70–140)
Potassium: 4.4 mEq/L (ref 3.5–5.1)
SODIUM: 141 meq/L (ref 136–145)
TOTAL PROTEIN: 6.3 g/dL — AB (ref 6.4–8.3)

## 2017-03-22 ENCOUNTER — Telehealth: Payer: Self-pay | Admitting: Internal Medicine

## 2017-03-22 ENCOUNTER — Encounter: Payer: Self-pay | Admitting: Internal Medicine

## 2017-03-22 ENCOUNTER — Ambulatory Visit (HOSPITAL_BASED_OUTPATIENT_CLINIC_OR_DEPARTMENT_OTHER): Payer: Medicare Other | Admitting: Internal Medicine

## 2017-03-22 ENCOUNTER — Other Ambulatory Visit: Payer: Self-pay | Admitting: Internal Medicine

## 2017-03-22 ENCOUNTER — Ambulatory Visit: Payer: Medicare Other

## 2017-03-22 ENCOUNTER — Other Ambulatory Visit (HOSPITAL_BASED_OUTPATIENT_CLINIC_OR_DEPARTMENT_OTHER): Payer: Medicare Other

## 2017-03-22 ENCOUNTER — Ambulatory Visit (HOSPITAL_BASED_OUTPATIENT_CLINIC_OR_DEPARTMENT_OTHER): Payer: Medicare Other

## 2017-03-22 VITALS — BP 127/73 | HR 76 | Temp 98.2°F | Resp 18 | Ht 70.0 in | Wt 158.4 lb

## 2017-03-22 DIAGNOSIS — C3492 Malignant neoplasm of unspecified part of left bronchus or lung: Secondary | ICD-10-CM

## 2017-03-22 DIAGNOSIS — C3432 Malignant neoplasm of lower lobe, left bronchus or lung: Secondary | ICD-10-CM

## 2017-03-22 DIAGNOSIS — R530 Neoplastic (malignant) related fatigue: Secondary | ICD-10-CM | POA: Diagnosis not present

## 2017-03-22 DIAGNOSIS — Z5111 Encounter for antineoplastic chemotherapy: Secondary | ICD-10-CM | POA: Diagnosis present

## 2017-03-22 DIAGNOSIS — T451X5A Adverse effect of antineoplastic and immunosuppressive drugs, initial encounter: Secondary | ICD-10-CM

## 2017-03-22 DIAGNOSIS — D6481 Anemia due to antineoplastic chemotherapy: Secondary | ICD-10-CM

## 2017-03-22 DIAGNOSIS — Z95828 Presence of other vascular implants and grafts: Secondary | ICD-10-CM

## 2017-03-22 LAB — CBC WITH DIFFERENTIAL/PLATELET
BASO%: 0.1 % (ref 0.0–2.0)
Basophils Absolute: 0 10*3/uL (ref 0.0–0.1)
EOS%: 1.2 % (ref 0.0–7.0)
Eosinophils Absolute: 0.1 10*3/uL (ref 0.0–0.5)
HEMATOCRIT: 29.4 % — AB (ref 38.4–49.9)
HEMOGLOBIN: 9.4 g/dL — AB (ref 13.0–17.1)
LYMPH#: 0.9 10*3/uL (ref 0.9–3.3)
LYMPH%: 12.8 % — ABNORMAL LOW (ref 14.0–49.0)
MCH: 31.9 pg (ref 27.2–33.4)
MCHC: 32 g/dL (ref 32.0–36.0)
MCV: 99.7 fL — ABNORMAL HIGH (ref 79.3–98.0)
MONO#: 0.6 10*3/uL (ref 0.1–0.9)
MONO%: 9.3 % (ref 0.0–14.0)
NEUT%: 76.6 % — ABNORMAL HIGH (ref 39.0–75.0)
NEUTROS ABS: 5.2 10*3/uL (ref 1.5–6.5)
PLATELETS: 177 10*3/uL (ref 140–400)
RBC: 2.95 10*6/uL — ABNORMAL LOW (ref 4.20–5.82)
RDW: 21.1 % — AB (ref 11.0–14.6)
WBC: 6.9 10*3/uL (ref 4.0–10.3)

## 2017-03-22 LAB — COMPREHENSIVE METABOLIC PANEL
ALT: 9 U/L (ref 0–55)
ANION GAP: 8 meq/L (ref 3–11)
AST: 15 U/L (ref 5–34)
Albumin: 3.6 g/dL (ref 3.5–5.0)
Alkaline Phosphatase: 80 U/L (ref 40–150)
BILIRUBIN TOTAL: 0.36 mg/dL (ref 0.20–1.20)
BUN: 13.8 mg/dL (ref 7.0–26.0)
CALCIUM: 9.3 mg/dL (ref 8.4–10.4)
CO2: 28 mEq/L (ref 22–29)
CREATININE: 1.2 mg/dL (ref 0.7–1.3)
Chloride: 105 mEq/L (ref 98–109)
EGFR: 59 mL/min/{1.73_m2} — ABNORMAL LOW (ref >90)
Glucose: 98 mg/dl (ref 70–140)
Potassium: 3.9 mEq/L (ref 3.5–5.1)
Sodium: 141 mEq/L (ref 136–145)
TOTAL PROTEIN: 6.4 g/dL (ref 6.4–8.3)

## 2017-03-22 MED ORDER — SODIUM CHLORIDE 0.9% FLUSH
10.0000 mL | INTRAVENOUS | Status: DC | PRN
  Administered 2017-03-22: 10 mL via INTRAVENOUS
  Filled 2017-03-22: qty 10

## 2017-03-22 MED ORDER — PALONOSETRON HCL INJECTION 0.25 MG/5ML
0.2500 mg | Freq: Once | INTRAVENOUS | Status: AC
  Administered 2017-03-22: 0.25 mg via INTRAVENOUS

## 2017-03-22 MED ORDER — DEXAMETHASONE SODIUM PHOSPHATE 10 MG/ML IJ SOLN
INTRAMUSCULAR | Status: AC
  Filled 2017-03-22: qty 1

## 2017-03-22 MED ORDER — DEXAMETHASONE SODIUM PHOSPHATE 10 MG/ML IJ SOLN
10.0000 mg | Freq: Once | INTRAMUSCULAR | Status: AC
  Administered 2017-03-22: 10 mg via INTRAVENOUS

## 2017-03-22 MED ORDER — SODIUM CHLORIDE 0.9% FLUSH
10.0000 mL | INTRAVENOUS | Status: DC | PRN
  Administered 2017-03-22: 10 mL
  Filled 2017-03-22: qty 10

## 2017-03-22 MED ORDER — SODIUM CHLORIDE 0.9 % IV SOLN
100.0000 mg/m2 | Freq: Once | INTRAVENOUS | Status: AC
  Administered 2017-03-22: 190 mg via INTRAVENOUS
  Filled 2017-03-22: qty 9.5

## 2017-03-22 MED ORDER — PALONOSETRON HCL INJECTION 0.25 MG/5ML
INTRAVENOUS | Status: AC
  Filled 2017-03-22: qty 5

## 2017-03-22 MED ORDER — SODIUM CHLORIDE 0.9 % IV SOLN
Freq: Once | INTRAVENOUS | Status: AC
  Administered 2017-03-22: 12:00:00 via INTRAVENOUS

## 2017-03-22 MED ORDER — SODIUM CHLORIDE 0.9 % IV SOLN
402.5000 mg | Freq: Once | INTRAVENOUS | Status: AC
  Administered 2017-03-22: 400 mg via INTRAVENOUS
  Filled 2017-03-22: qty 40

## 2017-03-22 MED ORDER — HEPARIN SOD (PORK) LOCK FLUSH 100 UNIT/ML IV SOLN
500.0000 [IU] | Freq: Once | INTRAVENOUS | Status: AC | PRN
  Administered 2017-03-22: 500 [IU]
  Filled 2017-03-22: qty 5

## 2017-03-22 NOTE — Patient Instructions (Signed)
[facility] Discharge Instructions for Patients Receiving Chemotherapy  Today you received the following chemotherapy agents [place]/Etoposide  To help prevent nausea and vomiting after your treatment, we encourage you to take your nausea medication as needed   If you develop nausea and vomiting that is not controlled by your nausea medication, call the clinic.   BELOW ARE SYMPTOMS THAT SHOULD BE REPORTED IMMEDIATELY:  *FEVER GREATER THAN 100.5 F  *CHILLS WITH OR WITHOUT FEVER  NAUSEA AND VOMITING THAT IS NOT CONTROLLED WITH YOUR NAUSEA MEDICATION  *UNUSUAL SHORTNESS OF BREATH  *UNUSUAL BRUISING OR BLEEDING  TENDERNESS IN MOUTH AND THROAT WITH OR WITHOUT PRESENCE OF ULCERS  *URINARY PROBLEMS  *BOWEL PROBLEMS  UNUSUAL RASH Items with * indicate a potential emergency and should be followed up as soon as possible.  Feel free to call the clinic you have any questions or concerns. The clinic phone number is [phone].  Please show the [facility] at check-in to the Emergency Department and triage nurse.

## 2017-03-22 NOTE — Progress Notes (Signed)
[facility] Telephone:(336) [phone]   Fax:(336) [phone]  OFFICE PROGRESS NOTE  [name], NP [place] [address]  DIAGNOSIS: Limited stage, Stage IIIA (T3, N2, M0) small cell lung cancer diagnosed in February 2018 and presented with left lower lobe lung mass in addition to subcarinal lymphadenopathy.  PRIOR THERAPY: None  CURRENT THERAPY: Systemic chemotherapy with carboplatin for AUC of 5 on day 1 and etoposide 100 MG/M2 on days 1, 2 and 3 with Neulasta support. This will be concurrent with radiotherapy. Status post 5 cycles.  INTERVAL HISTORY: Christian Munoz 82 y.o. male returns to the clinic today for follow-up visit accompanied by his son. The patient is feeling fine today with no specific complaints except for fatigue. He denied having any chest pain, shortness of breath, cough or hemoptysis. He denied having any weight loss or night sweats. He has no nausea, vomiting, diarrhea or constipation. Has been tolerating his treatment with chemotherapy fairly well except for the fatigue. He is here today for evaluation before starting cycle #6.   MEDICAL HISTORY: Past Medical History:  Diagnosis Date  . Allergic rhinitis due to pollen   . Anginal pain ([place])    Past medical history " does not seem to be a problem at all now."  . Cancer associated pain 02/08/2017  . Coronary atherosclerosis of native coronary artery   . Coronary atherosclerosis of native coronary artery   . Depressive disorder, not elsewhere classified   . Dysuria 01/18/2017  . Encounter for antineoplastic chemotherapy 11/30/2016  . GERD (gastroesophageal reflux disease)   . Goals of care, counseling/discussion 11/30/2016  . Headache    PMH: Migraines  . Herpes genitalia   . Hypertrophy of prostate without urinary obstruction and other lower urinary tract symptoms (LUTS)   . Hypertrophy of prostate without urinary obstruction and other lower urinary tract symptoms  (LUTS)   . Lack of coordination   . Lack of coordination   . Lumbago   . Nausea and vomiting 11/30/2016  . Osteoarthrosis, unspecified whether generalized or localized, unspecified site   . Osteoarthrosis, unspecified whether generalized or localized, unspecified site   . Other and unspecified hyperlipidemia   . Other malaise and fatigue   . Pneumonia   . Reflux esophagitis   . Skin cancer   . Sleep apnea    does not wear CPAP  . Syncope and collapse   . Urinary frequency   . Vitamin D deficiency     ALLERGIES:  is allergic to chantix [varenicline] and zoloft [sertraline hcl].  MEDICATIONS:  Current Outpatient Prescriptions  Medication Sig Dispense Refill  . aspirin 81 MG tablet Take 81 mg by mouth daily. Take one tablet once a day    . escitalopram (LEXAPRO) 5 MG tablet TAKE 1 TABLET(5 MG) BY MOUTH EVERY MORNING 30 tablet 2  . fish oil-omega-3 fatty acids 1000 MG capsule Take 2 g by mouth daily. Take one tablet twice a day    . metoprolol tartrate (LOPRESSOR) 25 MG tablet Take 0.5 tablets (12.5 mg total) by mouth 2 (two) times daily. 90 tablet 1  . Multiple Vitamins-Minerals (MULTIVITAMIN WITH MINERALS) tablet Take 1 tablet by mouth daily.    Marland Kitchen oxyCODONE-acetaminophen (PERCOCET/ROXICET) 5-325 MG tablet Take 1 tablet by mouth every 6 (six) hours as needed for severe pain. 30 tablet 0  . pantoprazole (PROTONIX) 20 MG tablet TAKE 1 TABLET(20 MG) BY MOUTH DAILY 90 tablet 0  . prochlorperazine (COMPAZINE) 10  MG tablet Take 1 tablet (10 mg total) by mouth every 6 (six) hours as needed for nausea or vomiting. 30 tablet 0  . QUEtiapine (SEROQUEL) 25 MG tablet Take 1 tablet (25 mg total) by mouth at bedtime. 1/2- 1 pill po qhs 30 tablet 2  . simvastatin (ZOCOR) 20 MG tablet TAKE 1 TABLET(20 MG) BY MOUTH DAILY 90 tablet 0  . sucralfate (CARAFATE) 1 g tablet Take 1 tablet (1 g total) by mouth 4 (four) times daily -  with meals and at bedtime. 5 min before meals for radiation induced  esophagitis 120 tablet 2  . tamsulosin (FLOMAX) 0.4 MG CAPS capsule TAKE 1 CAPSULE(0.4 MG) BY MOUTH DAILY 90 capsule 0  . Wound Cleansers (RADIAPLEX EX) Apply topically.     No current facility-administered medications for this visit.     SURGICAL HISTORY:  Past Surgical History:  Procedure Laterality Date  . CARDIAC CATHETERIZATION    . COLONOSCOPY    . EYE SURGERY    . IR GENERIC HISTORICAL  12/04/2016   IR US GUIDE VASC ACCESS RIGHT 12/04/2016 Greggory Keen, MD [facility]  . IR GENERIC HISTORICAL  12/04/2016   IR FLUORO GUIDE PORT INSERTION RIGHT 12/04/2016 Greggory Keen, MD [facility]  . poly vocal cords  1985  . SKIN CANCER EXCISION  2011  . TONSILLECTOMY    . VASECTOMY    . VIDEO BRONCHOSCOPY WITH ENDOBRONCHIAL ULTRASOUND N/A 11/20/2016   Procedure: VIDEO BRONCHOSCOPY WITH ENDOBRONCHIAL ULTRASOUND;  Surgeon: Collene Gobble, MD;  Location: [facility];  Service: Thoracic;  Laterality: N/A;    REVIEW OF SYSTEMS:  A comprehensive review of systems was negative except for: Constitutional: positive for fatigue   PHYSICAL EXAMINATION: General appearance: alert, cooperative, fatigued and no distress Head: Normocephalic, without obvious abnormality, atraumatic Neck: no adenopathy, no JVD, supple, symmetrical, trachea midline and thyroid not enlarged, symmetric, no tenderness/mass/nodules Lymph nodes: Cervical, supraclavicular, and axillary nodes normal. Resp: clear to auscultation bilaterally Back: symmetric, no curvature. ROM normal. No CVA tenderness. Cardio: regular rate and rhythm, S1, S2 normal, no murmur, click, rub or gallop GI: soft, non-tender; bowel sounds normal; no masses,  no organomegaly Extremities: extremities normal, atraumatic, no cyanosis or edema  ECOG PERFORMANCE STATUS: 1 - Symptomatic but completely ambulatory  Blood pressure 127/73, pulse 76, temperature 98.2 F (36.8 C), temperature source Oral, resp. rate 18, height 5\' 10"  (1.778 m), weight 158 lb 6.4 oz  (71.8 kg), SpO2 95 %.  LABORATORY DATA: Lab Results  Component Value Date   WBC 6.9 03/22/2017   HGB 9.4 (L) 03/22/2017   HCT 29.4 (L) 03/22/2017   MCV 99.7 (H) 03/22/2017   PLT 177 03/22/2017      Chemistry      Component Value Date/Time   NA 141 03/15/2017 1136   K 4.4 03/15/2017 1136   CL 105 12/04/2016 0814   CO2 29 03/15/2017 1136   BUN 11.8 03/15/2017 1136   CREATININE 1.1 03/15/2017 1136      Component Value Date/Time   CALCIUM 9.1 03/15/2017 1136   ALKPHOS 87 03/15/2017 1136   AST 15 03/15/2017 1136   ALT 11 03/15/2017 1136   BILITOT 0.34 03/15/2017 1136       RADIOGRAPHIC STUDIES: Ct Chest W Contrast  Addendum Date: 02/26/2017   ADDENDUM REPORT: 02/26/2017 13:55 ADDENDUM: Volume Correction:  75 mL Isovue IV contrast. Electronically Signed   By: Suzy Bouchard M.D.   On: 02/26/2017 13:55   Result Date: 02/26/2017 CLINICAL DATA:  Restaging lung cancer. Initial diagnosis over 2018. Radiation therapy completed Feb 26, 2017 EXAM: CT CHEST WITH CONTRAST TECHNIQUE: Multidetector CT imaging of the chest was performed during intravenous contrast administration. CONTRAST:  100 cc Isovue COMPARISON:  CT 01/14/2017, PET-CT 11/29/2016 FINDINGS: Cardiovascular: Coronary artery calcification and aortic atherosclerotic calcification. Mediastinum/Nodes: No axillary or supraclavicular adenopathy. No mediastinal or hilar adenopathy. Port in the RIGHT chest wall with tip in distal SVC. No mediastinal adenopathy.  No pericardial fluid.  Esophagus normal. Lungs/Pleura: Interval contraction of the LEFT lower lobe mass measuring 38 x 25 mm decreased from ill-defined consolidative mass of 65 x 54 mm on most recent CT scan. Post obstructive collapse in the LEFT lower lobe is improved. No new nodularity.  centrilobular emphysema in  the upper lobes. Upper Abdomen: Limited view of the liver, kidneys, pancreas are unremarkable. Normal adrenal glands. Multiple benign hepatic cysts demonstrated. No  new nodularity Musculoskeletal: No aggressive osseous lesion. IMPRESSION: 1. Contraction of the LEFT lower lobe mass and improvement in postobstructive collapse. 2. No new pulmonary nodules. 3. No mediastinal adenopathy. 4. Emphysema. 5. Coronary artery calcification and aortic atherosclerotic calcification. Electronically Signed: By: Suzy Bouchard M.D. On: 02/26/2017 13:40    ASSESSMENT AND PLAN:  This is a very pleasant 82 years old white male with limited stage small cell lung cancer and currently undergoing systemic chemotherapy with carboplatin and etoposide status post 5 cycles. The patient is tolerating his treatment well except for fatigue secondary to chemotherapy-induced anemia. I recommended for him to proceed with cycle #6 today as scheduled. I will see him back for follow-up visit in one month's for evaluation after repeating CT scan of the chest for restaging of his disease. He was advised to call immediately if he has any concerning symptoms in the interval. The patient voices understanding of current disease status and treatment options and is in agreement with the current care plan. All questions were answered. The patient knows to call the clinic with any problems, questions or concerns. We can certainly see the patient much sooner if necessary. I spent 10 minutes counseling the patient face to face. The total time spent in the appointment was 15 minutes.  Disclaimer: This note was dictated with voice recognition software. Similar sounding words can inadvertently be transcribed and may not be corrected upon review.

## 2017-03-22 NOTE — Patient Instructions (Signed)

## 2017-03-22 NOTE — Telephone Encounter (Signed)
Gave patient avs report and appointments for June and July. No further treatment dates in care plan beyond this week.

## 2017-03-23 ENCOUNTER — Ambulatory Visit (HOSPITAL_BASED_OUTPATIENT_CLINIC_OR_DEPARTMENT_OTHER): Payer: Medicare Other

## 2017-03-23 VITALS — BP 123/68 | HR 73 | Temp 98.2°F | Resp 18

## 2017-03-23 DIAGNOSIS — Z5111 Encounter for antineoplastic chemotherapy: Secondary | ICD-10-CM

## 2017-03-23 DIAGNOSIS — C3432 Malignant neoplasm of lower lobe, left bronchus or lung: Secondary | ICD-10-CM

## 2017-03-23 DIAGNOSIS — C3492 Malignant neoplasm of unspecified part of left bronchus or lung: Secondary | ICD-10-CM

## 2017-03-23 MED ORDER — DEXAMETHASONE SODIUM PHOSPHATE 10 MG/ML IJ SOLN
INTRAMUSCULAR | Status: AC
  Filled 2017-03-23: qty 1

## 2017-03-23 MED ORDER — HEPARIN SOD (PORK) LOCK FLUSH 100 UNIT/ML IV SOLN
500.0000 [IU] | Freq: Once | INTRAVENOUS | Status: AC | PRN
  Administered 2017-03-23: 500 [IU]
  Filled 2017-03-23: qty 5

## 2017-03-23 MED ORDER — DEXAMETHASONE SODIUM PHOSPHATE 10 MG/ML IJ SOLN
10.0000 mg | Freq: Once | INTRAMUSCULAR | Status: AC
  Administered 2017-03-23: 10 mg via INTRAVENOUS

## 2017-03-23 MED ORDER — SODIUM CHLORIDE 0.9 % IV SOLN
100.0000 mg/m2 | Freq: Once | INTRAVENOUS | Status: AC
  Administered 2017-03-23: 190 mg via INTRAVENOUS
  Filled 2017-03-23: qty 9.5

## 2017-03-23 MED ORDER — SODIUM CHLORIDE 0.9% FLUSH
10.0000 mL | INTRAVENOUS | Status: DC | PRN
  Administered 2017-03-23: 10 mL
  Filled 2017-03-23: qty 10

## 2017-03-23 MED ORDER — SODIUM CHLORIDE 0.9 % IV SOLN
Freq: Once | INTRAVENOUS | Status: AC
  Administered 2017-03-23: 12:00:00 via INTRAVENOUS

## 2017-03-23 NOTE — Patient Instructions (Signed)
[facility] Discharge Instructions for Patients Receiving Chemotherapy  Today you received the following chemotherapy agents Etoposide  To help prevent nausea and vomiting after your treatment, we encourage you to take your nausea medication as needed   If you develop nausea and vomiting that is not controlled by your nausea medication, call the clinic.   BELOW ARE SYMPTOMS THAT SHOULD BE REPORTED IMMEDIATELY:  *FEVER GREATER THAN 100.5 F  *CHILLS WITH OR WITHOUT FEVER  NAUSEA AND VOMITING THAT IS NOT CONTROLLED WITH YOUR NAUSEA MEDICATION  *UNUSUAL SHORTNESS OF BREATH  *UNUSUAL BRUISING OR BLEEDING  TENDERNESS IN MOUTH AND THROAT WITH OR WITHOUT PRESENCE OF ULCERS  *URINARY PROBLEMS  *BOWEL PROBLEMS  UNUSUAL RASH Items with * indicate a potential emergency and should be followed up as soon as possible.  Feel free to call the clinic you have any questions or concerns. The clinic phone number is [phone].  Please show the [place] at check-in to the Emergency Department and triage nurse.

## 2017-03-24 ENCOUNTER — Ambulatory Visit (HOSPITAL_BASED_OUTPATIENT_CLINIC_OR_DEPARTMENT_OTHER): Payer: Medicare Other

## 2017-03-24 DIAGNOSIS — Z5111 Encounter for antineoplastic chemotherapy: Secondary | ICD-10-CM | POA: Diagnosis present

## 2017-03-24 DIAGNOSIS — C3432 Malignant neoplasm of lower lobe, left bronchus or lung: Secondary | ICD-10-CM | POA: Diagnosis not present

## 2017-03-24 DIAGNOSIS — C3492 Malignant neoplasm of unspecified part of left bronchus or lung: Secondary | ICD-10-CM

## 2017-03-24 MED ORDER — DEXAMETHASONE SODIUM PHOSPHATE 10 MG/ML IJ SOLN
INTRAMUSCULAR | Status: AC
  Filled 2017-03-24: qty 1

## 2017-03-24 MED ORDER — SODIUM CHLORIDE 0.9% FLUSH
10.0000 mL | INTRAVENOUS | Status: DC | PRN
  Administered 2017-03-24: 10 mL
  Filled 2017-03-24: qty 10

## 2017-03-24 MED ORDER — SODIUM CHLORIDE 0.9 % IV SOLN
100.0000 mg/m2 | Freq: Once | INTRAVENOUS | Status: AC
  Administered 2017-03-24: 190 mg via INTRAVENOUS
  Filled 2017-03-24: qty 9.5

## 2017-03-24 MED ORDER — DEXAMETHASONE SODIUM PHOSPHATE 10 MG/ML IJ SOLN
10.0000 mg | Freq: Once | INTRAMUSCULAR | Status: AC
  Administered 2017-03-24: 10 mg via INTRAVENOUS

## 2017-03-24 MED ORDER — SODIUM CHLORIDE 0.9 % IV SOLN
Freq: Once | INTRAVENOUS | Status: AC
  Administered 2017-03-24: 09:00:00 via INTRAVENOUS

## 2017-03-24 MED ORDER — HEPARIN SOD (PORK) LOCK FLUSH 100 UNIT/ML IV SOLN
500.0000 [IU] | Freq: Once | INTRAVENOUS | Status: AC | PRN
  Administered 2017-03-24: 500 [IU]
  Filled 2017-03-24: qty 5

## 2017-03-24 NOTE — Patient Instructions (Signed)
[facility] Discharge Instructions for Patients Receiving Chemotherapy  Today you received the following chemotherapy agents Etoposide  To help prevent nausea and vomiting after your treatment, we encourage you to take your nausea medication as needed   If you develop nausea and vomiting that is not controlled by your nausea medication, call the clinic.   BELOW ARE SYMPTOMS THAT SHOULD BE REPORTED IMMEDIATELY:  *FEVER GREATER THAN 100.5 F  *CHILLS WITH OR WITHOUT FEVER  NAUSEA AND VOMITING THAT IS NOT CONTROLLED WITH YOUR NAUSEA MEDICATION  *UNUSUAL SHORTNESS OF BREATH  *UNUSUAL BRUISING OR BLEEDING  TENDERNESS IN MOUTH AND THROAT WITH OR WITHOUT PRESENCE OF ULCERS  *URINARY PROBLEMS  *BOWEL PROBLEMS  UNUSUAL RASH Items with * indicate a potential emergency and should be followed up as soon as possible.  Feel free to call the clinic you have any questions or concerns. The clinic phone number is [phone].  Please show the [place] at check-in to the Emergency Department and triage nurse.

## 2017-03-26 ENCOUNTER — Ambulatory Visit (HOSPITAL_BASED_OUTPATIENT_CLINIC_OR_DEPARTMENT_OTHER): Payer: Medicare Other

## 2017-03-26 VITALS — BP 123/69 | HR 83 | Temp 97.8°F | Resp 20

## 2017-03-26 DIAGNOSIS — Z5189 Encounter for other specified aftercare: Secondary | ICD-10-CM

## 2017-03-26 DIAGNOSIS — C3432 Malignant neoplasm of lower lobe, left bronchus or lung: Secondary | ICD-10-CM | POA: Diagnosis present

## 2017-03-26 DIAGNOSIS — C3492 Malignant neoplasm of unspecified part of left bronchus or lung: Secondary | ICD-10-CM

## 2017-03-26 MED ORDER — PEGFILGRASTIM INJECTION 6 MG/0.6ML ~~LOC~~
6.0000 mg | PREFILLED_SYRINGE | Freq: Once | SUBCUTANEOUS | Status: AC
  Administered 2017-03-26: 6 mg via SUBCUTANEOUS
  Filled 2017-03-26: qty 0.6

## 2017-03-26 NOTE — Patient Instructions (Signed)
Pegfilgrastim injection What is this medicine? PEGFILGRASTIM (PEG fil gra stim) is a long-acting granulocyte colony-stimulating factor that stimulates the growth of neutrophils, a type of white blood cell important in the body's fight against infection. It is used to reduce the incidence of fever and infection in patients with certain types of cancer who are receiving chemotherapy that affects the bone marrow, and to increase survival after being exposed to high doses of radiation. This medicine may be used for other purposes; ask your health care provider or pharmacist if you have questions. COMMON BRAND NAME(S): Neulasta What should I tell my health care provider before I take this medicine? They need to know if you have any of these conditions: -kidney disease -latex allergy -ongoing radiation therapy -sickle cell disease -skin reactions to acrylic adhesives (On-Body Injector only) -an unusual or allergic reaction to pegfilgrastim, filgrastim, other medicines, foods, dyes, or preservatives -pregnant or trying to get pregnant -breast-feeding How should I use this medicine? This medicine is for injection under the skin. If you get this medicine at home, you will be taught how to prepare and give the pre-filled syringe or how to use the On-body Injector. Refer to the patient Instructions for Use for detailed instructions. Use exactly as directed. Tell your healthcare provider immediately if you suspect that the On-body Injector may not have performed as intended or if you suspect the use of the On-body Injector resulted in a missed or partial dose. It is important that you put your used needles and syringes in a special sharps container. Do not put them in a trash can. If you do not have a sharps container, call your pharmacist or healthcare provider to get one. Talk to your pediatrician regarding the use of this medicine in children. While this drug may be prescribed for selected conditions,  precautions do apply. Overdosage: If you think you have taken too much of this medicine contact a poison control center or emergency room at once. NOTE: This medicine is only for you. Do not share this medicine with others. What if I miss a dose? It is important not to miss your dose. Call your doctor or health care professional if you miss your dose. If you miss a dose due to an On-body Injector failure or leakage, a new dose should be administered as soon as possible using a single prefilled syringe for manual use. What may interact with this medicine? Interactions have not been studied. Give your health care provider a list of all the medicines, herbs, non-prescription drugs, or dietary supplements you use. Also tell them if you smoke, drink alcohol, or use illegal drugs. Some items may interact with your medicine. This list may not describe all possible interactions. Give your health care provider a list of all the medicines, herbs, non-prescription drugs, or dietary supplements you use. Also tell them if you smoke, drink alcohol, or use illegal drugs. Some items may interact with your medicine. What should I watch for while using this medicine? You may need blood work done while you are taking this medicine. If you are going to need a MRI, CT scan, or other procedure, tell your doctor that you are using this medicine (On-Body Injector only). What side effects may I notice from receiving this medicine? Side effects that you should report to your doctor or health care professional as soon as possible: -allergic reactions like skin rash, itching or hives, swelling of the face, lips, or tongue -dizziness -fever -pain, redness, or irritation at site   where injected -pinpoint red spots on the skin -red or dark-brown urine -shortness of breath or breathing problems -stomach or side pain, or pain at the shoulder -swelling -tiredness -trouble passing urine or change in the amount of urine Side  effects that usually do not require medical attention (report to your doctor or health care professional if they continue or are bothersome): -bone pain -muscle pain This list may not describe all possible side effects. Call your doctor for medical advice about side effects. You may report side effects to FDA at 1-800-FDA-1088. Where should I keep my medicine? Keep out of the reach of children. Store pre-filled syringes in a refrigerator between 2 and 8 degrees C (36 and 46 degrees F). Do not freeze. Keep in carton to protect from light. Throw away this medicine if it is left out of the refrigerator for more than 48 hours. Throw away any unused medicine after the expiration date. NOTE: This sheet is a summary. It may not cover all possible information. If you have questions about this medicine, talk to your doctor, pharmacist, or health care provider.  2018 [employer] (2016-09-24 12:58:03)  

## 2017-03-29 ENCOUNTER — Other Ambulatory Visit (HOSPITAL_BASED_OUTPATIENT_CLINIC_OR_DEPARTMENT_OTHER): Payer: Medicare Other

## 2017-03-29 DIAGNOSIS — C3492 Malignant neoplasm of unspecified part of left bronchus or lung: Secondary | ICD-10-CM

## 2017-03-29 DIAGNOSIS — C3432 Malignant neoplasm of lower lobe, left bronchus or lung: Secondary | ICD-10-CM

## 2017-03-29 LAB — CBC WITH DIFFERENTIAL/PLATELET
BASO%: 0.3 % (ref 0.0–2.0)
Basophils Absolute: 0 10*3/uL (ref 0.0–0.1)
EOS%: 0.4 % (ref 0.0–7.0)
Eosinophils Absolute: 0 10*3/uL (ref 0.0–0.5)
HEMATOCRIT: 28.1 % — AB (ref 38.4–49.9)
HGB: 9.2 g/dL — ABNORMAL LOW (ref 13.0–17.1)
LYMPH#: 0.4 10*3/uL — AB (ref 0.9–3.3)
LYMPH%: 3.8 % — AB (ref 14.0–49.0)
MCH: 32.2 pg (ref 27.2–33.4)
MCHC: 32.8 g/dL (ref 32.0–36.0)
MCV: 98.1 fL — ABNORMAL HIGH (ref 79.3–98.0)
MONO#: 0.3 10*3/uL (ref 0.1–0.9)
MONO%: 2.4 % (ref 0.0–14.0)
NEUT#: 9.9 10*3/uL — ABNORMAL HIGH (ref 1.5–6.5)
NEUT%: 93.1 % — AB (ref 39.0–75.0)
Platelets: 155 10*3/uL (ref 140–400)
RBC: 2.87 10*6/uL — AB (ref 4.20–5.82)
RDW: 20.9 % — ABNORMAL HIGH (ref 11.0–14.6)
WBC: 10.6 10*3/uL — ABNORMAL HIGH (ref 4.0–10.3)

## 2017-03-29 LAB — COMPREHENSIVE METABOLIC PANEL
ALBUMIN: 3.5 g/dL (ref 3.5–5.0)
ALK PHOS: 96 U/L (ref 40–150)
ALT: 10 U/L (ref 0–55)
AST: 13 U/L (ref 5–34)
Anion Gap: 6 mEq/L (ref 3–11)
BILIRUBIN TOTAL: 0.45 mg/dL (ref 0.20–1.20)
BUN: 21.4 mg/dL (ref 7.0–26.0)
CO2: 28 meq/L (ref 22–29)
CREATININE: 1 mg/dL (ref 0.7–1.3)
Calcium: 9.2 mg/dL (ref 8.4–10.4)
Chloride: 106 mEq/L (ref 98–109)
EGFR: 73 mL/min/{1.73_m2} — ABNORMAL LOW (ref >90)
GLUCOSE: 100 mg/dL (ref 70–140)
Potassium: 4.1 mEq/L (ref 3.5–5.1)
SODIUM: 140 meq/L (ref 136–145)
TOTAL PROTEIN: 6.2 g/dL — AB (ref 6.4–8.3)

## 2017-03-30 ENCOUNTER — Ambulatory Visit
Admission: RE | Admit: 2017-03-30 | Discharge: 2017-03-30 | Disposition: A | Discharge status: Home / Self Care | Payer: Medicare Other | Source: Ambulatory Visit | Attending: Urology | Admitting: Urology

## 2017-03-30 ENCOUNTER — Encounter: Payer: Self-pay | Admitting: Urology

## 2017-03-30 VITALS — BP 133/59 | HR 85 | Temp 98.2°F | Resp 18 | Ht 70.0 in | Wt 157.0 lb

## 2017-03-30 DIAGNOSIS — E785 Hyperlipidemia, unspecified: Secondary | ICD-10-CM | POA: Insufficient documentation

## 2017-03-30 DIAGNOSIS — I2510 Atherosclerotic heart disease of native coronary artery without angina pectoris: Secondary | ICD-10-CM | POA: Diagnosis not present

## 2017-03-30 DIAGNOSIS — Z85828 Personal history of other malignant neoplasm of skin: Secondary | ICD-10-CM | POA: Diagnosis not present

## 2017-03-30 DIAGNOSIS — Z87891 Personal history of nicotine dependence: Secondary | ICD-10-CM | POA: Diagnosis not present

## 2017-03-30 DIAGNOSIS — F329 Major depressive disorder, single episode, unspecified: Secondary | ICD-10-CM | POA: Diagnosis not present

## 2017-03-30 DIAGNOSIS — E559 Vitamin D deficiency, unspecified: Secondary | ICD-10-CM | POA: Diagnosis not present

## 2017-03-30 DIAGNOSIS — Z7982 Long term (current) use of aspirin: Secondary | ICD-10-CM | POA: Diagnosis not present

## 2017-03-30 DIAGNOSIS — Z8249 Family history of ischemic heart disease and other diseases of the circulatory system: Secondary | ICD-10-CM | POA: Insufficient documentation

## 2017-03-30 DIAGNOSIS — C3492 Malignant neoplasm of unspecified part of left bronchus or lung: Secondary | ICD-10-CM | POA: Diagnosis not present

## 2017-03-30 DIAGNOSIS — M1990 Unspecified osteoarthritis, unspecified site: Secondary | ICD-10-CM | POA: Diagnosis not present

## 2017-03-30 DIAGNOSIS — Z79899 Other long term (current) drug therapy: Secondary | ICD-10-CM | POA: Insufficient documentation

## 2017-03-30 DIAGNOSIS — G4730 Sleep apnea, unspecified: Secondary | ICD-10-CM | POA: Insufficient documentation

## 2017-03-30 DIAGNOSIS — Z888 Allergy status to other drugs, medicaments and biological substances status: Secondary | ICD-10-CM | POA: Insufficient documentation

## 2017-03-30 DIAGNOSIS — Z510 Encounter for antineoplastic radiation therapy: Secondary | ICD-10-CM | POA: Diagnosis present

## 2017-03-30 DIAGNOSIS — K219 Gastro-esophageal reflux disease without esophagitis: Secondary | ICD-10-CM | POA: Insufficient documentation

## 2017-03-30 NOTE — Addendum Note (Signed)
Encounter addended by: Malena Edman, RN on: 03/30/2017  3:42 PM<BR>    Actions taken: Charge Capture section accepted

## 2017-03-30 NOTE — Progress Notes (Signed)
Radiation Oncology         [phone] ________________________________  Name: Christian Munoz MRN: [id number]  Date: 03/30/2017  DOB: 10-01-1935  Post Treatment Note  CC: [name], NP  Curt Bears, MD  Diagnosis:   Limited stage small cell lung cancer in the left lower lung     Interval Since Last Radiation:  5 weeks  01/07/17 - 02/24/17:   The primary tumor and involved mediastinal adenopathy were treated to 59.4 Gy in 33 fractions of 1.8 Gy.  Narrative:  The patient returns today for routine follow-up.  He tolerated concurrent chemoradiation relatively well.  He did experience some moderate dysphagia which was managed appropriately with regular use of Carafate. He also noted modest fatigue and mild back pain.                             He has completed 6 of 6 cycles of systemic chemotherapy with carboplatin and etoposide.  He has a planned follow up with Dr. Julien Nordmann on 04/20/17 following restaging CT Chest and PET imaging studies.  On review of systems, the patient states that he has recovered well from the effects of radiotherapy though he remains quite fatigued.  He denies dysphagia, N/V/D or abdominal pain.  Denies productive cough, hemoptysis, chest pain, fever or chills.  He has a decent appetite and is maintaining his weight.  He was recently diagnosed with shingles on his right thigh.  ALLERGIES:  is allergic to chantix [varenicline] and zoloft [sertraline hcl].  Meds: Current Outpatient Prescriptions  Medication Sig Dispense Refill  . aspirin 81 MG tablet Take 81 mg by mouth daily. Take one tablet once a day    . escitalopram (LEXAPRO) 5 MG tablet TAKE 1 TABLET(5 MG) BY MOUTH EVERY MORNING 30 tablet 2  . fish oil-omega-3 fatty acids 1000 MG capsule Take 2 g by mouth daily. Take one tablet twice a day    . Multiple Vitamins-Minerals (MULTIVITAMIN WITH MINERALS) tablet Take 1 tablet by mouth daily.    . pantoprazole (PROTONIX) 20 MG tablet TAKE 1 TABLET(20 MG)  BY MOUTH DAILY 90 tablet 0  . QUEtiapine (SEROQUEL) 25 MG tablet Take 1 tablet (25 mg total) by mouth at bedtime. 1/2- 1 pill po qhs 30 tablet 2  . simvastatin (ZOCOR) 20 MG tablet TAKE 1 TABLET(20 MG) BY MOUTH DAILY 90 tablet 0  . tamsulosin (FLOMAX) 0.4 MG CAPS capsule TAKE 1 CAPSULE(0.4 MG) BY MOUTH DAILY 90 capsule 0  . metoprolol tartrate (LOPRESSOR) 25 MG tablet Take 0.5 tablets (12.5 mg total) by mouth 2 (two) times daily. (Patient not taking: Reported on 03/30/2017) 90 tablet 1  . oxyCODONE-acetaminophen (PERCOCET/ROXICET) 5-325 MG tablet Take 1 tablet by mouth every 6 (six) hours as needed for severe pain. (Patient not taking: Reported on 03/30/2017) 30 tablet 0  . prochlorperazine (COMPAZINE) 10 MG tablet Take 1 tablet (10 mg total) by mouth every 6 (six) hours as needed for nausea or vomiting. (Patient not taking: Reported on 03/30/2017) 30 tablet 0  . sucralfate (CARAFATE) 1 g tablet Take 1 tablet (1 g total) by mouth 4 (four) times daily -  with meals and at bedtime. 5 min before meals for radiation induced esophagitis (Patient not taking: Reported on 03/30/2017) 120 tablet 2   No current facility-administered medications for this encounter.     Physical Findings:  height is 5\' 10"  (1.778 m) and weight is 157 lb (71.2 kg). His oral  temperature is 98.2 F (36.8 C). His blood pressure is 133/59 (abnormal) and his pulse is 85. His respiration is 18 and oxygen saturation is 98%.  Pain Assessment Pain Score: 3  (Right thigh from shingles)/10 In general this is a well appearing caucasian male in no acute distress. He's alert and oriented x4 and appropriate throughout the examination. Cardiopulmonary assessment is negative for acute distress and he exhibits normal effort.   Lab Findings: Lab Results  Component Value Date   WBC 10.6 (H) 03/29/2017   HGB 9.2 (L) 03/29/2017   HCT 28.1 (L) 03/29/2017   MCV 98.1 (H) 03/29/2017   PLT 155 03/29/2017     Radiographic Findings: No results  found.  Impression/Plan: 1. Limited stage small cell lung cancer in the left lower lung.  He has recovered well from the effects of radiotherapy and has recently completed his concurrent systemic chemotherapy. He has a planned follow up with Dr. Julien Nordmann on 04/20/17 following restaging CT Chest and PET imaging studies.  His ongoing disease monitoring and management will continue under the care and direction of Dr. Julien Nordmann. He knows to call with any questions or concerns related to his previous chest radiotherapy. 2. Prophylactic Cranial Irradiation.  We reviewed the role of prophylactic brain radiotherapy in the management of his disease. We discussed the natural history of limited stage small cell lung cancer and potential for brain metastasis, highlighting the role of preventative radiotherapy in the management. We will await findings on restaging imaging studies noted above to ensure no further chemotherapy is indicated at this time.  Once this is confirmed, we will obtain a repeat MRI brain prior to proceeding with scheduling him for CT Simulation for PCI if the patient wishes to proceed.    Nicholos Johns, PA-C

## 2017-03-31 ENCOUNTER — Telehealth: Payer: Self-pay

## 2017-03-31 NOTE — Telephone Encounter (Signed)
Patient called to schedule appt with Sherrie Mustache, I thought It was scheduled but at the sametime someone else had used open slot. So I tried to call patient back to reschedule appt. Left vm for patient to return call. Patient needs appt for BP issues and Medications.

## 2017-04-02 ENCOUNTER — Encounter: Payer: Self-pay | Admitting: Nurse Practitioner

## 2017-04-02 ENCOUNTER — Ambulatory Visit (INDEPENDENT_AMBULATORY_CARE_PROVIDER_SITE_OTHER): Payer: Medicare Other | Admitting: Nurse Practitioner

## 2017-04-02 VITALS — BP 118/60 | HR 63 | Temp 97.8°F | Resp 18 | Ht 70.0 in | Wt 158.6 lb

## 2017-04-02 DIAGNOSIS — B0223 Postherpetic polyneuropathy: Secondary | ICD-10-CM

## 2017-04-02 DIAGNOSIS — K5901 Slow transit constipation: Secondary | ICD-10-CM

## 2017-04-02 DIAGNOSIS — I2510 Atherosclerotic heart disease of native coronary artery without angina pectoris: Secondary | ICD-10-CM

## 2017-04-02 DIAGNOSIS — I10 Essential (primary) hypertension: Secondary | ICD-10-CM | POA: Diagnosis not present

## 2017-04-02 MED ORDER — GABAPENTIN 100 MG PO CAPS: 100.0000 mg | ORAL_CAPSULE | Freq: Three times a day (TID) | ORAL | 3 refills | Status: DC

## 2017-04-02 NOTE — Progress Notes (Signed)
Careteam: Patient Care Team: [name], NP as PCP - General (Nurse Practitioner) [name], OD as Referring Physician (Optometry)  Advanced Directive information Type of Advance Directive: Healthcare Power of [place];Living will  Allergies  Allergen Reactions  . Chantix [Varenicline] Other (See Comments)    Makes patient suicidal  . Zoloft [Sertraline Hcl] Other (See Comments)    Makes patient suicidal    Chief Complaint  Patient presents with  . Acute Visit    Pt is being seen due to low BP, also wants to discuss lowering medications.      HPI: Patient is a 82 y.o. male seen in the office today due to low blood pressure. Pt has notice his blood pressure was low at his appts and he was feeling weak and lethargic so he stopped metoprolol. Overall he is feeling better.   Last weekend went to urgent care and was found to have shingles. Vesicles have dried and scabbed over. Still having some numbness and pain from this. Woke him up last night due to pain. Oxycodone has not helped.   Constipation- using miralax which is helpful. Wondering if this is okay to continue.   Review of Systems:  Review of Systems  Constitutional: Negative for chills, fever, malaise/fatigue and weight loss.  HENT: Negative for tinnitus.   Respiratory: Negative for cough, sputum production and shortness of breath.   Cardiovascular: Negative for chest pain, palpitations and leg swelling.  Gastrointestinal: Positive for constipation. Negative for abdominal pain, diarrhea and heartburn.  Genitourinary: Negative for dysuria, frequency and urgency.  Musculoskeletal: Negative for back pain, falls, joint pain and myalgias.  Skin: Positive for itching and rash.  Neurological: Positive for tingling and sensory change. Negative for dizziness, weakness and headaches.  Psychiatric/Behavioral: Negative for depression and memory loss. The patient does not have insomnia.     Past Medical History:    Diagnosis Date  . Allergic rhinitis due to pollen   . Anginal pain ([place])    Past medical history " does not seem to be a problem at all now."  . Cancer associated pain 02/08/2017  . Coronary atherosclerosis of native coronary artery   . Coronary atherosclerosis of native coronary artery   . Depressive disorder, not elsewhere classified   . Dysuria 01/18/2017  . Encounter for antineoplastic chemotherapy 11/30/2016  . GERD (gastroesophageal reflux disease)   . Goals of care, counseling/discussion 11/30/2016  . Headache    PMH: Migraines  . Herpes genitalia   . Hypertrophy of prostate without urinary obstruction and other lower urinary tract symptoms (LUTS)   . Hypertrophy of prostate without urinary obstruction and other lower urinary tract symptoms (LUTS)   . Lack of coordination   . Lack of coordination   . Lumbago   . Nausea and vomiting 11/30/2016  . Osteoarthrosis, unspecified whether generalized or localized, unspecified site   . Osteoarthrosis, unspecified whether generalized or localized, unspecified site   . Other and unspecified hyperlipidemia   . Other malaise and fatigue   . Pneumonia   . Reflux esophagitis   . Skin cancer   . Sleep apnea    does not wear CPAP  . Syncope and collapse   . Urinary frequency   . Vitamin D deficiency    Past Surgical History:  Procedure Laterality Date  . CARDIAC CATHETERIZATION    . COLONOSCOPY    . EYE SURGERY    . IR GENERIC HISTORICAL  12/04/2016   IR US GUIDE VASC ACCESS RIGHT  12/04/2016 Greggory Keen, MD [facility]  . IR GENERIC HISTORICAL  12/04/2016   IR FLUORO GUIDE PORT INSERTION RIGHT 12/04/2016 Greggory Keen, MD [facility]  . poly vocal cords  1985  . SKIN CANCER EXCISION  2011  . TONSILLECTOMY    . VASECTOMY    . VIDEO BRONCHOSCOPY WITH ENDOBRONCHIAL ULTRASOUND N/A 11/20/2016   Procedure: VIDEO BRONCHOSCOPY WITH ENDOBRONCHIAL ULTRASOUND;  Surgeon: Collene Gobble, MD;  Location: [facility];  Service: Thoracic;  Laterality:  N/A;   Social History:   reports that he quit smoking about 7 months ago. His smoking use included Cigarettes. He has a 30.00 pack-year smoking history. He has never used smokeless tobacco. He reports that he does not drink alcohol or use drugs.  Family History  Problem Relation Age of Onset  . Heart disease Father   . Cancer Father   . Cancer Brother   . Cancer Brother     Medications: Patient's Medications  New Prescriptions   No medications on file  Previous Medications   ASPIRIN 81 MG TABLET    Take 81 mg by mouth daily. Take one tablet once a day   ESCITALOPRAM (LEXAPRO) 5 MG TABLET    TAKE 1 TABLET(5 MG) BY MOUTH EVERY MORNING   FISH OIL-OMEGA-3 FATTY ACIDS 1000 MG CAPSULE    Take 2 g by mouth daily. Take one tablet twice a day   METOPROLOL TARTRATE (LOPRESSOR) 25 MG TABLET    Take 0.5 tablets (12.5 mg total) by mouth 2 (two) times daily.   MULTIPLE VITAMINS-MINERALS (MULTIVITAMIN WITH MINERALS) TABLET    Take 1 tablet by mouth daily.   OXYCODONE-ACETAMINOPHEN (PERCOCET/ROXICET) 5-325 MG TABLET    Take 1 tablet by mouth every 6 (six) hours as needed for severe pain.   PANTOPRAZOLE (PROTONIX) 20 MG TABLET    TAKE 1 TABLET(20 MG) BY MOUTH DAILY   PROCHLORPERAZINE (COMPAZINE) 10 MG TABLET    Take 1 tablet (10 mg total) by mouth every 6 (six) hours as needed for nausea or vomiting.   QUETIAPINE (SEROQUEL) 25 MG TABLET    Take 1 tablet (25 mg total) by mouth at bedtime. 1/2- 1 pill po qhs   SIMVASTATIN (ZOCOR) 20 MG TABLET    TAKE 1 TABLET(20 MG) BY MOUTH DAILY   TAMSULOSIN (FLOMAX) 0.4 MG CAPS CAPSULE    TAKE 1 CAPSULE(0.4 MG) BY MOUTH DAILY  Modified Medications   No medications on file  Discontinued Medications   SUCRALFATE (CARAFATE) 1 G TABLET    Take 1 tablet (1 g total) by mouth 4 (four) times daily -  with meals and at bedtime. 5 min before meals for radiation induced esophagitis     Physical Exam:  Vitals:   04/02/17 1121  BP: 118/60  Pulse: 63  Resp: 18  Temp:  97.8 F (36.6 C)  TempSrc: Oral  SpO2: 96%  Weight: 158 lb 9.6 oz (71.9 kg)  Height: 5\' 10"  (1.778 m)   Body mass index is 22.76 kg/m.  Physical Exam  Constitutional: He is oriented to person, place, and time. No distress.  HENT:  Head: Normocephalic and atraumatic.  Nose: Nose normal.  Mouth/Throat: Oropharynx is clear and moist. No oropharyngeal exudate.  Eyes: Conjunctivae and EOM are normal. Pupils are equal, round, and reactive to light.  Neck: Normal range of motion. Neck supple. No thyromegaly present.  Cardiovascular: Normal rate, regular rhythm and normal heart sounds.   Pulmonary/Chest: Effort normal and breath sounds normal. No respiratory distress. He has  no wheezes. He has no rales. He exhibits no tenderness.  Abdominal: Soft. Bowel sounds are normal.  Musculoskeletal: Normal range of motion.  Walking with walking stick now, requires guidance  Neurological: He is alert and oriented to person, place, and time.  Skin: Skin is warm and dry. There is pallor.  Scabbed over lesions to left lateral thigh   Psychiatric: He has a normal mood and affect.    Labs reviewed: Basic Metabolic Panel:  Recent Labs  07/13/16 1407 10/22/16 1601  10/23/16 1523 11/20/16 0800  12/04/16 0814  03/15/17 1136 03/22/17 0951 03/29/17 1124  NA 139  --   --  134* 140  < > 141  < > 141 141 140  K 4.3  --   --  3.7 3.9  < > 4.2  < > 4.4 3.9 4.1  CL 103  --   --  98* 105  --  105  --   --   --   --   CO2 26  --   --  24 27  < > 28  < > 29 28 28   GLUCOSE 97  --   --  168* 104*  < > 105*  < > 98 98 100  BUN 21  --   --  11 14  < > 16  < > 11.8 13.8 21.4  CREATININE 1.10  --   < > 1.13 1.25*  < > 1.10  < > 1.1 1.2 1.0  CALCIUM 9.5  --   --  8.7* 8.7*  < > 9.2  < > 9.1 9.3 9.2  TSH 2.62 2.20  --   --   --   --   --   --   --   --   --   < > = values in this interval not displayed. Liver Function Tests:  Recent Labs  03/15/17 1136 03/22/17 0951 03/29/17 1124  AST 15 15 13   ALT 11 9  10   ALKPHOS 87 80 96  BILITOT 0.34 0.36 0.45  PROT 6.3* 6.4 6.2*  ALBUMIN 3.7 3.6 3.5   No results for input(s): LIPASE, AMYLASE in the last 8760 hours. No results for input(s): AMMONIA in the last 8760 hours. CBC:  Recent Labs  03/15/17 1136 03/22/17 0951 03/29/17 1124  WBC 8.9 6.9 10.6*  NEUTROABS 7.3* 5.2 9.9*  HGB 9.4* 9.4* 9.2*  HCT 28.7* 29.4* 28.1*  MCV 95.9 99.7* 98.1*  PLT 77* 177 155   Lipid Panel:  Recent Labs  02/15/17  CHOL 114  HDL 47  LDLCALC 56  TRIG 53   TSH:  Recent Labs  07/13/16 1407 10/22/16 1601  TSH 2.62 2.20   A1C: Lab Results  Component Value Date   HGBA1C 5.7 (H) 07/13/2016     Assessment/Plan 1. 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 Essential hypertension -stable off metoprolol. Medication removed from medlist  3. Slow transit constipation Okay to cont miralax daily with good hydration. So to increase fiber in diet.   Follow up in 4 months, sooner if needed  Jessica K. Harle Battiest  [facility] [phone] 8 am - 5 pm) [phone] (after hours)

## 2017-04-02 NOTE — Patient Instructions (Signed)
To start gabapentin 100 mg by mouth three times daily as needed for nerve pain.  Call us if this is not helping symptoms  STOP Lopressor

## 2017-04-05 ENCOUNTER — Telehealth: Payer: Self-pay | Admitting: *Deleted

## 2017-04-05 NOTE — Telephone Encounter (Signed)
Patient called and stated that the [place] pain is bad and very uncomfortable. Started taking the Gabapentin three times daily yesterday with NO relief. Not working. Please Advise.

## 2017-04-05 NOTE — Telephone Encounter (Signed)
Per Janett Billow: pt is to take 2 tablets by mouth three times a day.   Patient was notified of the dose change and asked to call the office if the increased dosage of gabapentin does not help. Patient agreed.   Medication list has been updated.

## 2017-04-06 NOTE — Telephone Encounter (Signed)
Patient called and stated that he has been taking 2 tablets by mouth three times daily with no relief. States the shingle pain is severe and uncomfortable. Please advise.

## 2017-04-06 NOTE — Telephone Encounter (Signed)
I spoke with patient and per Janett Billow, instructed patient increase the gabapentin to 300 mg TID. (pt stated that he did have some relief from the previous increase)  Patient is to call the office if 300 mg TID is not working. Next option would be to try Lyrica.

## 2017-04-08 ENCOUNTER — Other Ambulatory Visit: Payer: Self-pay | Admitting: Nurse Practitioner

## 2017-04-08 ENCOUNTER — Telehealth: Payer: Self-pay | Admitting: *Deleted

## 2017-04-08 MED ORDER — GABAPENTIN 300 MG PO CAPS: 300.0000 mg | ORAL_CAPSULE | Freq: Three times a day (TID) | ORAL | 3 refills | Status: AC

## 2017-04-08 NOTE — Telephone Encounter (Signed)
If he is able to tolerate it, may start slowly

## 2017-04-08 NOTE — Telephone Encounter (Signed)
Patient notified and agreed. Medication list updated and Rx faxed to pharmacy.

## 2017-04-08 NOTE — Telephone Encounter (Signed)
Is it ok for him to start working out again?

## 2017-04-08 NOTE — Telephone Encounter (Signed)
Glad the 300 mg TID is working, please update the medication list and we can give refill so it is only 1 tablet that is 300 mg TID (so he does not have to take 3 tablets at once) Okay to take naproxen twice daily as needed for short duration. Would not take routinely over 1 week The nerve pain is normal after shingles.

## 2017-04-08 NOTE — Telephone Encounter (Signed)
Patient called with Concerns regarding his shingle pain.  1.)Stated that he is taking 300mg  of Gabapentin tid and it has helped. Stated that he generally has a tolerance for pain. 6-7/10 pain scale. Stated that he gets waves of pain that are a 9/10 that passes but are frequent. Wants to know if this is Normal?  2.) Wants to know if he can take Naprosyn Sodium in addition to the Gabapentin?  3.)Wants to know if he can start back working out on the treadmill again?  Please Advise.

## 2017-04-16 ENCOUNTER — Other Ambulatory Visit: Payer: Self-pay | Admitting: Emergency Medicine

## 2017-04-19 ENCOUNTER — Telehealth: Payer: Self-pay | Admitting: Medical Oncology

## 2017-04-19 ENCOUNTER — Other Ambulatory Visit: Payer: Self-pay

## 2017-04-19 NOTE — Telephone Encounter (Addendum)
I called son and he said no one called him about scan . He will try to get pt to scan tomorrow and f/u.

## 2017-04-20 ENCOUNTER — Ambulatory Visit (HOSPITAL_COMMUNITY): Admission: RE | Admit: 2017-04-20 | Payer: Medicare Other | Source: Ambulatory Visit

## 2017-04-20 ENCOUNTER — Ambulatory Visit: Payer: Self-pay | Admitting: Internal Medicine

## 2017-04-20 ENCOUNTER — Ambulatory Visit (HOSPITAL_COMMUNITY): Payer: Self-pay

## 2017-04-20 ENCOUNTER — Telehealth: Payer: Self-pay | Admitting: Internal Medicine

## 2017-04-20 NOTE — Telephone Encounter (Signed)
Patient had to cancel appointment for 7/10 both CT and Dr appointments and needs to reschedule.  His CT is r/s for 7/16 at 9am  He just needs an appointment with Dr Julien Nordmann and he does not having anything in the next couple of weeks.

## 2017-04-26 ENCOUNTER — Other Ambulatory Visit (HOSPITAL_BASED_OUTPATIENT_CLINIC_OR_DEPARTMENT_OTHER): Payer: Medicare Other

## 2017-04-26 ENCOUNTER — Ambulatory Visit (HOSPITAL_COMMUNITY)
Admission: RE | Admit: 2017-04-26 | Discharge: 2017-04-26 | Disposition: A | Discharge status: Home / Self Care | Payer: Medicare Other | Source: Ambulatory Visit | Attending: Internal Medicine | Admitting: Internal Medicine

## 2017-04-26 ENCOUNTER — Encounter (HOSPITAL_COMMUNITY): Payer: Self-pay

## 2017-04-26 DIAGNOSIS — J700 Acute pulmonary manifestations due to radiation: Secondary | ICD-10-CM | POA: Diagnosis not present

## 2017-04-26 DIAGNOSIS — T451X5A Adverse effect of antineoplastic and immunosuppressive drugs, initial encounter: Secondary | ICD-10-CM | POA: Insufficient documentation

## 2017-04-26 DIAGNOSIS — J439 Emphysema, unspecified: Secondary | ICD-10-CM | POA: Diagnosis not present

## 2017-04-26 DIAGNOSIS — I700 Atherosclerosis of aorta: Secondary | ICD-10-CM | POA: Diagnosis not present

## 2017-04-26 DIAGNOSIS — J90 Pleural effusion, not elsewhere classified: Secondary | ICD-10-CM | POA: Insufficient documentation

## 2017-04-26 DIAGNOSIS — Z5111 Encounter for antineoplastic chemotherapy: Secondary | ICD-10-CM | POA: Insufficient documentation

## 2017-04-26 DIAGNOSIS — C3432 Malignant neoplasm of lower lobe, left bronchus or lung: Secondary | ICD-10-CM | POA: Diagnosis not present

## 2017-04-26 DIAGNOSIS — D6481 Anemia due to antineoplastic chemotherapy: Secondary | ICD-10-CM | POA: Insufficient documentation

## 2017-04-26 DIAGNOSIS — C3492 Malignant neoplasm of unspecified part of left bronchus or lung: Secondary | ICD-10-CM | POA: Diagnosis present

## 2017-04-26 LAB — CBC WITH DIFFERENTIAL/PLATELET
BASO%: 0.2 % (ref 0.0–2.0)
BASOS ABS: 0 10*3/uL (ref 0.0–0.1)
EOS%: 3.3 % (ref 0.0–7.0)
Eosinophils Absolute: 0.2 10*3/uL (ref 0.0–0.5)
HCT: 33.6 % — ABNORMAL LOW (ref 38.4–49.9)
HGB: 10.4 g/dL — ABNORMAL LOW (ref 13.0–17.1)
LYMPH%: 13.9 % — AB (ref 14.0–49.0)
MCH: 31.1 pg (ref 27.2–33.4)
MCHC: 31 g/dL — AB (ref 32.0–36.0)
MCV: 100.6 fL — ABNORMAL HIGH (ref 79.3–98.0)
MONO#: 0.8 10*3/uL (ref 0.1–0.9)
MONO%: 14.5 % — AB (ref 0.0–14.0)
NEUT#: 3.7 10*3/uL (ref 1.5–6.5)
NEUT%: 68.1 % (ref 39.0–75.0)
Platelets: 184 10*3/uL (ref 140–400)
RBC: 3.34 10*6/uL — ABNORMAL LOW (ref 4.20–5.82)
RDW: 15 % — ABNORMAL HIGH (ref 11.0–14.6)
WBC: 5.5 10*3/uL (ref 4.0–10.3)
lymph#: 0.8 10*3/uL — ABNORMAL LOW (ref 0.9–3.3)

## 2017-04-26 LAB — COMPREHENSIVE METABOLIC PANEL
ALT: 10 U/L (ref 0–55)
AST: 17 U/L (ref 5–34)
Albumin: 3.4 g/dL — ABNORMAL LOW (ref 3.5–5.0)
Alkaline Phosphatase: 72 U/L (ref 40–150)
Anion Gap: 9 mEq/L (ref 3–11)
BUN: 12 mg/dL (ref 7.0–26.0)
CALCIUM: 9.4 mg/dL (ref 8.4–10.4)
CHLORIDE: 105 meq/L (ref 98–109)
CO2: 27 mEq/L (ref 22–29)
Creatinine: 1.1 mg/dL (ref 0.7–1.3)
EGFR: 65 mL/min/{1.73_m2} — ABNORMAL LOW (ref >90)
GLUCOSE: 105 mg/dL (ref 70–140)
POTASSIUM: 3.9 meq/L (ref 3.5–5.1)
SODIUM: 141 meq/L (ref 136–145)
Total Bilirubin: 0.45 mg/dL (ref 0.20–1.20)
Total Protein: 6.8 g/dL (ref 6.4–8.3)

## 2017-04-26 MED ORDER — IOPAMIDOL (ISOVUE-300) INJECTION 61%
INTRAVENOUS | Status: AC
  Filled 2017-04-26: qty 75

## 2017-04-26 MED ORDER — IOPAMIDOL (ISOVUE-300) INJECTION 61%
75.0000 mL | Freq: Once | INTRAVENOUS | Status: AC | PRN
  Administered 2017-04-26: 75 mL via INTRAVENOUS

## 2017-04-27 ENCOUNTER — Telehealth: Payer: Self-pay | Admitting: Medical Oncology

## 2017-04-27 NOTE — Telephone Encounter (Signed)
Intense pain left side "where my cancer is located and was a  10/10 and made me yelp". He took oxycodone and went to sleep and when he woke up it was significantly better. Last oxycodone was 3 weeks ago. I tld him to monitor it for now or  I can schedule pt with Altamese Dilling . Appt with [facility] tomorrow.

## 2017-04-28 ENCOUNTER — Encounter: Payer: Self-pay | Admitting: Oncology

## 2017-04-28 ENCOUNTER — Ambulatory Visit (HOSPITAL_BASED_OUTPATIENT_CLINIC_OR_DEPARTMENT_OTHER): Payer: Medicare Other | Admitting: Oncology

## 2017-04-28 VITALS — BP 120/68 | HR 81 | Temp 98.7°F | Resp 18 | Ht 70.0 in | Wt 162.1 lb

## 2017-04-28 DIAGNOSIS — D6481 Anemia due to antineoplastic chemotherapy: Secondary | ICD-10-CM

## 2017-04-28 DIAGNOSIS — C3432 Malignant neoplasm of lower lobe, left bronchus or lung: Secondary | ICD-10-CM

## 2017-04-28 DIAGNOSIS — C3492 Malignant neoplasm of unspecified part of left bronchus or lung: Secondary | ICD-10-CM

## 2017-04-28 NOTE — Progress Notes (Signed)
[facility] [phone]   [phone]  OFFICE PROGRESS NOTE  [name], NP [place] [address]  DIAGNOSIS: Limited stage, Stage IIIA (T3, N2, M0) small cell lung cancer diagnosed in February 2018 and presented with left lower lobe lung mass in addition to subcarinal lymphadenopathy.  PRIOR THERAPY: None  CURRENT THERAPY: Systemic chemotherapy with carboplatin for AUC of 5 on day 1 and etoposide 100 MG/M2 on days 1, 2 and 3 with Neulasta support. This will be concurrent with radiotherapy. Status post 6 cycles.  INTERVAL HISTORY: Christian Munoz 82 y.o. male returns to the clinic today for follow-up visit by himself. The patient is feeling fine today with no specific complaints except for fatigue. He denied having any chest pain, shortness of breath, cough or hemoptysis. He denied having any weight loss or night sweats. He has no nausea, vomiting, diarrhea or constipation. Has been tolerating his treatment with chemotherapy fairly well except for the fatigue. He is here today for evaluation and to discuss his recent restaging CT scan results.   MEDICAL HISTORY: Past Medical History:  Diagnosis Date  . Allergic rhinitis due to pollen   . Anginal pain ([place])    Past medical history " does not seem to be a problem at all now."  . Cancer associated pain 02/08/2017  . Coronary atherosclerosis of native coronary artery   . Coronary atherosclerosis of native coronary artery   . Depressive disorder, not elsewhere classified   . Dysuria 01/18/2017  . Encounter for antineoplastic chemotherapy 11/30/2016  . GERD (gastroesophageal reflux disease)   . Goals of care, counseling/discussion 11/30/2016  . Headache    PMH: Migraines  . Herpes genitalia   . Hypertrophy of prostate without urinary obstruction and other lower urinary tract symptoms (LUTS)   . Hypertrophy of prostate without urinary obstruction and other lower urinary  tract symptoms (LUTS)   . Lack of coordination   . Lack of coordination   . Lumbago   . Nausea and vomiting 11/30/2016  . Osteoarthrosis, unspecified whether generalized or localized, unspecified site   . Osteoarthrosis, unspecified whether generalized or localized, unspecified site   . Other and unspecified hyperlipidemia   . Other malaise and fatigue   . Pneumonia   . Reflux esophagitis   . Skin cancer   . Sleep apnea    does not wear CPAP  . Syncope and collapse   . Urinary frequency   . Vitamin D deficiency     ALLERGIES:  is allergic to chantix [varenicline] and zoloft [sertraline hcl].  MEDICATIONS:  Current Outpatient Prescriptions  Medication Sig Dispense Refill  . aspirin 81 MG tablet Take 81 mg by mouth daily. Take one tablet once a day    . escitalopram (LEXAPRO) 5 MG tablet TAKE 1 TABLET(5 MG) BY MOUTH EVERY MORNING 30 tablet 2  . fish oil-omega-3 fatty acids 1000 MG capsule Take 2 g by mouth daily. Take one tablet twice a day    . gabapentin (NEURONTIN) 300 MG capsule Take 1 capsule (300 mg total) by mouth 3 (three) times daily. 90 capsule 3  . Multiple Vitamins-Minerals (MULTIVITAMIN WITH MINERALS) tablet Take 1 tablet by mouth daily.    Marland Kitchen oxyCODONE-acetaminophen (PERCOCET/ROXICET) 5-325 MG tablet Take 1 tablet by mouth every 6 (six) hours as needed for severe pain. 30 tablet 0  . pantoprazole (PROTONIX) 20 MG tablet TAKE 1 TABLET(20 MG) BY MOUTH DAILY 90 tablet 0  . QUEtiapine (  SEROQUEL) 25 MG tablet TAKE 1/2- 1 TABLET BY MOUTH EVERY NIGHT AT BEDTIME 30 tablet 0  . simvastatin (ZOCOR) 20 MG tablet TAKE 1 TABLET(20 MG) BY MOUTH DAILY 90 tablet 0  . tamsulosin (FLOMAX) 0.4 MG CAPS capsule TAKE 1 CAPSULE(0.4 MG) BY MOUTH DAILY 90 capsule 0   No current facility-administered medications for this visit.     SURGICAL HISTORY:  Past Surgical History:  Procedure Laterality Date  . CARDIAC CATHETERIZATION    . COLONOSCOPY    . EYE SURGERY    . IR GENERIC HISTORICAL   12/04/2016   IR US GUIDE VASC ACCESS RIGHT 12/04/2016 Greggory Keen, MD [facility]  . IR GENERIC HISTORICAL  12/04/2016   IR FLUORO GUIDE PORT INSERTION RIGHT 12/04/2016 Greggory Keen, MD [facility]  . poly vocal cords  1985  . SKIN CANCER EXCISION  2011  . TONSILLECTOMY    . VASECTOMY    . VIDEO BRONCHOSCOPY WITH ENDOBRONCHIAL ULTRASOUND N/A 11/20/2016   Procedure: VIDEO BRONCHOSCOPY WITH ENDOBRONCHIAL ULTRASOUND;  Surgeon: Collene Gobble, MD;  Location: [facility];  Service: Thoracic;  Laterality: N/A;    REVIEW OF SYSTEMS:  A comprehensive review of systems was negative except for: Constitutional: positive for fatigue   PHYSICAL EXAMINATION: General appearance: alert, cooperative, fatigued and no distress Head: Normocephalic, without obvious abnormality, atraumatic Neck: no adenopathy, no JVD, supple, symmetrical, trachea midline and thyroid not enlarged, symmetric, no tenderness/mass/nodules Lymph nodes: Cervical, supraclavicular, and axillary nodes normal. Resp: clear to auscultation bilaterally Back: symmetric, no curvature. ROM normal. No CVA tenderness. Cardio: regular rate and rhythm, S1, S2 normal, no murmur, click, rub or gallop GI: soft, non-tender; bowel sounds normal; no masses,  no organomegaly Extremities: extremities normal, atraumatic, no cyanosis or edema  ECOG PERFORMANCE STATUS: 1 - Symptomatic but completely ambulatory  Blood pressure 120/68, pulse 81, temperature 98.7 F (37.1 C), temperature source Oral, resp. rate 18, height 5\' 10"  (1.778 m), weight 162 lb 1.6 oz (73.5 kg), SpO2 95 %.  LABORATORY DATA: Lab Results  Component Value Date   WBC 5.5 04/26/2017   HGB 10.4 (L) 04/26/2017   HCT 33.6 (L) 04/26/2017   MCV 100.6 (H) 04/26/2017   PLT 184 04/26/2017      Chemistry      Component Value Date/Time   NA 141 04/26/2017 0747   K 3.9 04/26/2017 0747   CL 105 12/04/2016 0814   CO2 27 04/26/2017 0747   BUN 12.0 04/26/2017 0747   CREATININE 1.1 04/26/2017  0747      Component Value Date/Time   CALCIUM 9.4 04/26/2017 0747   ALKPHOS 72 04/26/2017 0747   AST 17 04/26/2017 0747   ALT 10 04/26/2017 0747   BILITOT 0.45 04/26/2017 0747       RADIOGRAPHIC STUDIES: Ct Chest W Contrast  Result Date: 04/26/2017 CLINICAL DATA:  Small cell lung cancer diagnosed in January 2018. Chemotherapy and radiation therapy completed. EXAM: CT CHEST WITH CONTRAST TECHNIQUE: Multidetector CT imaging of the chest was performed during intravenous contrast administration. CONTRAST:  37mL ISOVUE-300 IOPAMIDOL (ISOVUE-300) INJECTION 61% COMPARISON:  Chest CT 02/26/2017 and 01/14/2017. FINDINGS: Cardiovascular: Atherosclerosis of aorta, great vessels and coronary arteries again noted. No acute vascular findings are seen. There is chronic attenuation of the pulmonary vessels in the left lower lobe without acute filling defect. Right IJ Port-A-Cath appears unchanged. The heart size is normal. There is no pericardial effusion. Mediastinum/Nodes: There are no enlarged mediastinal, hilar or axillary lymph nodes. The thyroid gland, trachea and  esophagus demonstrate no significant findings. Lungs/Pleura: A small left pleural effusion has enlarged compared with the prior study although demonstrates no worrisome features. The left infrahilar mass appears slightly smaller and less well-defined, measuring approximately 4.0 x 1.5 cm on image 92. Surrounding pulmonary opacity is similar, likely due to a combination of postobstructive and radiation pneumonitis. There is stable narrowing of the left lower lobe bronchus and peripheral left lower lobe bronchiectasis. Underlying moderate to severe centrilobular emphysema noted. No new or enlarging pulmonary nodules are seen. Upper abdomen: The visualized upper abdomen appears stable without acute or suspicious findings. There are multiple hepatic cysts and nonobstructing nephrolithiasis bilaterally. Musculoskeletal/Chest wall: There is no chest wall  mass or suspicious osseous finding. Stable scoliosis and spondylosis. IMPRESSION: 1. Since the most recent study of 2 months ago, the left lower lobe mass appear slightly smaller. Adjacent parenchymal opacities are similar, attributed to a combination of postobstructive and radiation pneumonitis. Small left pleural effusion has mildly enlarged. 2. No distant metastases identified. 3. Aortic Atherosclerosis (ICD10-I70.0) and Emphysema (ICD10-J43.9). Electronically Signed   By: Richardean Sale M.D.   On: 04/26/2017 11:22    ASSESSMENT AND PLAN:  This is a very pleasant 82 year old white male with limited stage small cell lung cancer and currently undergoing systemic chemotherapy with carboplatin and etoposide status post 6 cycles. The patient tolerated his treatment well except for fatigue secondary to chemotherapy-induced anemia.  The patient was seen with Dr. Julien Nordmann. CT scan results were reviewed with the patient which shows improvement. A recommendation for evaluation by radiation oncology for consideration of prophylactic brain irradiation was recommended. Discussed the potential for short-term memory loss and dementia with the patient. He has declined to proceed with radiation to the brain. The plan will be to continue to observe the patient closely.  Recommend that he receive a restaging CT scan in approximately 3 months with a follow-up visit after that to discuss the results. The patient was instructed to call us if he has any change in his mental status, bone pain, increased shortness of breath, or hemoptysis and we can certainly see him sooner.  The patient voices understanding of current disease status and treatment options and is in agreement with the current care plan. All questions were answered. The patient knows to call the clinic with any problems, questions or concerns. We can certainly see the patient much sooner if necessary.  Mikey Bussing, DNP, AGPCNP-BC,  AOCNP  ADDENDUM: Hematology/Oncology Attending:  I had a face to face encounter with the patient. I recommended his care plan. This is a very pleasant 82 years old white male with limited stage small cell lung cancer who completed a course of systemic chemotherapy with carboplatin and etoposide for 6 cycles concurrent with radiation. The patient tolerated his treatment well with no significant adverse effects except for fatigue. He had repeat CT scan of the chest performed recently. I personally and independently reviewed the scan results and discussed with the patient today. His scan showed continuous improvement of his disease. I recommended for the patient to continue on observation for now. I would see him back for follow-up visit in 3 months for reevaluation and repeat CT scan of the chest for restaging of his disease. I also discussed with the patient and prophylactic cranial irradiation but he declined this option. He was advised to call immediately if he has any concerning symptoms in the interval.  Disclaimer: This note was dictated with voice recognition software. Similar sounding words can inadvertently  be transcribed and may be missed upon review. Eilleen Kempf., MD 04/29/17

## 2017-05-06 ENCOUNTER — Other Ambulatory Visit: Payer: Self-pay | Admitting: Nurse Practitioner

## 2017-05-07 ENCOUNTER — Ambulatory Visit (HOSPITAL_BASED_OUTPATIENT_CLINIC_OR_DEPARTMENT_OTHER): Payer: Medicare Other

## 2017-05-07 DIAGNOSIS — Z452 Encounter for adjustment and management of vascular access device: Secondary | ICD-10-CM | POA: Diagnosis present

## 2017-05-07 DIAGNOSIS — Z95828 Presence of other vascular implants and grafts: Secondary | ICD-10-CM

## 2017-05-07 DIAGNOSIS — C3432 Malignant neoplasm of lower lobe, left bronchus or lung: Secondary | ICD-10-CM

## 2017-05-07 MED ORDER — HEPARIN SOD (PORK) LOCK FLUSH 100 UNIT/ML IV SOLN
500.0000 [IU] | Freq: Once | INTRAVENOUS | Status: AC | PRN
  Administered 2017-05-07: 500 [IU] via INTRAVENOUS
  Filled 2017-05-07: qty 5

## 2017-05-07 MED ORDER — SODIUM CHLORIDE 0.9% FLUSH
10.0000 mL | INTRAVENOUS | Status: DC | PRN
  Administered 2017-05-07: 10 mL via INTRAVENOUS
  Filled 2017-05-07: qty 10

## 2017-05-07 NOTE — Patient Instructions (Signed)

## 2017-05-19 ENCOUNTER — Ambulatory Visit: Payer: Self-pay | Admitting: Internal Medicine

## 2017-05-20 ENCOUNTER — Telehealth: Payer: Self-pay | Admitting: Medical Oncology

## 2017-05-20 NOTE — Telephone Encounter (Signed)
I returned pt call from message he left with on call service. He is reporting mood changes x 3-4 weeks specifically anger directed at politics and sometimes when he thinks of family members. With anger he "shakes, voice changes, and I drool". He also reports he sleeps until 4 pm about 12-14 hours. Christian Munoz notified.

## 2017-05-21 ENCOUNTER — Other Ambulatory Visit: Payer: Self-pay | Admitting: Internal Medicine

## 2017-05-21 DIAGNOSIS — C3492 Malignant neoplasm of unspecified part of left bronchus or lung: Secondary | ICD-10-CM

## 2017-05-25 ENCOUNTER — Telehealth: Payer: Self-pay | Admitting: *Deleted

## 2017-05-25 NOTE — Telephone Encounter (Signed)
Call from pt's daughter regarding pt's mood changes. MD is fully aware of this concerns as he put an order in for MRI of Brain on 8/10. Discussed with daughter central scheduling will call her with an appt. Phone # to Central scheduling given to her per her request. No further concerns.

## 2017-05-31 ENCOUNTER — Telehealth: Payer: Self-pay

## 2017-05-31 ENCOUNTER — Ambulatory Visit (HOSPITAL_COMMUNITY): Admission: RE | Admit: 2017-05-31 | Payer: Medicare Other | Source: Ambulatory Visit

## 2017-05-31 NOTE — Telephone Encounter (Signed)
Son notified of appt with [facility] on Thursday.

## 2017-05-31 NOTE — Telephone Encounter (Signed)
First available after the scan with me or Kristen.

## 2017-05-31 NOTE — Telephone Encounter (Addendum)
Son called back and scan moved to this Friday 8/24 so cancel 8/23 f/u and need to r/s with Christian Munoz

## 2017-05-31 NOTE — Telephone Encounter (Signed)
Christian Munoz was calling to make an appt this week to discuss MRI brain that is being done 8/21.

## 2017-06-01 ENCOUNTER — Ambulatory Visit (HOSPITAL_COMMUNITY): Admission: RE | Admit: 2017-06-01 | Payer: Medicare Other | Source: Ambulatory Visit

## 2017-06-01 ENCOUNTER — Telehealth: Payer: Self-pay | Admitting: Internal Medicine

## 2017-06-01 NOTE — Telephone Encounter (Signed)
Spoke with patient's son and scheduled him a f/u for next Monday.

## 2017-06-03 ENCOUNTER — Ambulatory Visit: Payer: Self-pay | Admitting: Internal Medicine

## 2017-06-04 ENCOUNTER — Ambulatory Visit (HOSPITAL_COMMUNITY)
Admission: RE | Admit: 2017-06-04 | Discharge: 2017-06-04 | Disposition: A | Discharge status: Home / Self Care | Payer: Medicare Other | Source: Ambulatory Visit | Attending: Internal Medicine | Admitting: Internal Medicine

## 2017-06-04 DIAGNOSIS — C3492 Malignant neoplasm of unspecified part of left bronchus or lung: Secondary | ICD-10-CM | POA: Insufficient documentation

## 2017-06-04 MED ORDER — GADOBENATE DIMEGLUMINE 529 MG/ML IV SOLN
15.0000 mL | Freq: Once | INTRAVENOUS | Status: AC | PRN
  Administered 2017-06-04: 14 mL via INTRAVENOUS

## 2017-06-06 ENCOUNTER — Other Ambulatory Visit: Payer: Self-pay | Admitting: Nurse Practitioner

## 2017-06-07 ENCOUNTER — Encounter: Payer: Medicare Other | Admitting: Oncology

## 2017-06-07 ENCOUNTER — Telehealth: Payer: Self-pay

## 2017-06-07 NOTE — Telephone Encounter (Signed)
Spoke with patients son and his appt has been r/s to 8/29 per inbasket message  Christian Munoz

## 2017-06-08 ENCOUNTER — Other Ambulatory Visit: Payer: Self-pay | Admitting: Nurse Practitioner

## 2017-06-08 ENCOUNTER — Other Ambulatory Visit: Payer: Self-pay | Admitting: Internal Medicine

## 2017-06-08 NOTE — Progress Notes (Signed)
Chart opened in error. Pt cancelled appt and not seen on 8/27. Pt has rescheduled appt to later this week.

## 2017-06-09 ENCOUNTER — Encounter: Payer: Self-pay | Admitting: Oncology

## 2017-06-09 ENCOUNTER — Ambulatory Visit (HOSPITAL_BASED_OUTPATIENT_CLINIC_OR_DEPARTMENT_OTHER): Payer: Medicare Other | Admitting: Oncology

## 2017-06-09 VITALS — BP 124/78 | HR 94 | Temp 98.5°F | Resp 17 | Wt 154.0 lb

## 2017-06-09 DIAGNOSIS — R4182 Altered mental status, unspecified: Secondary | ICD-10-CM | POA: Diagnosis not present

## 2017-06-09 DIAGNOSIS — Z85118 Personal history of other malignant neoplasm of bronchus and lung: Secondary | ICD-10-CM | POA: Diagnosis present

## 2017-06-09 DIAGNOSIS — C3492 Malignant neoplasm of unspecified part of left bronchus or lung: Secondary | ICD-10-CM

## 2017-06-09 NOTE — Progress Notes (Signed)
[facility] up:    Christian Chandler, NP [address] [place] [address]   DIAGNOSIS: Cancer Staging Small cell lung cancer, left ([place]) Staging form: Lung, AJCC 8th Edition - Clinical: Stage IIIB (cT3, cN2, cM0) - Signed by Curt Bears, MD on 11/30/2016 Limited stage, Stage IIIA (T3, N2, M0) small cell lung cancer diagnosed in February 2018 and presented with left lower lobe lung mass in addition to subcarinal lymphadenopathy.  SUMMARY OF ONCOLOGIC HISTORY:  No history exists.   PRIOR THERAPY:  Systemic chemotherapy with carboplatin for AUC of 5 on day 1 and etoposide 100 MG/M2 on days 1, 2 and 3 with Neulasta support. This will be concurrent with radiotherapy. Status post 6 cycles.  CURRENT THERAPY: observation  INTERVAL HISTORY: Christian Munoz 82 y.o. male returns for hollow up with his son. The patient's family was contacted our office recently and reported that the patient had increased anger issues. An MRI of the brain was ordered to evaluate for possible brain metastasis. The patient today tells me that he continues to have anger issues really feels shaky and pounds his face. This is been going on for about 5-6 weeks. The patient had an episode the other night of lightheadedness that has now resolved. He has reports that he is not sleeping very well, but when more detail as asked, he reports that he goes to sleep around 2:58 AM and sleeps for approximately 12 hours waking up mid to late afternoon. Son expresses concern that he is not really returned to baseline since completing his chemotherapy. The patient reports fatigue. Denies fevers and chills. Reports a cough which is persistent and unchanged from previously. Denies chest pain and hemoptysis. Denies abdominal pain nausea, vomiting, constipation, diarrhea. Denies headaches and visual disturbances. Reports poor appetite, but thinks he eats about 3 meals a day. He reports that he has lost  weight. The patient is here to discuss recent MRI of the brain results.   Patient Active Problem List   Diagnosis Date Noted  . Antineoplastic chemotherapy induced anemia 03/01/2017  . Cancer associated pain 02/08/2017  . Dysuria 01/18/2017  . Fever 01/13/2017  . Port catheter in place 01/12/2017  . Small cell lung cancer, left ([place]) 11/30/2016  . Goals of care, counseling/discussion 11/30/2016  . Encounter for antineoplastic chemotherapy 11/30/2016  . History of fall 02/28/2016  . Hypertension 12/04/2014  . Benign prostatic hyperplasia with urinary obstruction 12/04/2014  . Depression 12/04/2014  . Hyperlipidemia 01/06/2013  . Reflux esophagitis   . Osteoarthrosis, unspecified whether generalized or localized, unspecified site     is allergic to chantix [varenicline] and zoloft [sertraline hcl].  MEDICAL HISTORY: Past Medical History:  Diagnosis Date  . Allergic rhinitis due to pollen   . Anginal pain ([place])    Past medical history " does not seem to be a problem at all now."  . Cancer associated pain 02/08/2017  . Coronary atherosclerosis of native coronary artery   . Coronary atherosclerosis of native coronary artery   . Depressive disorder, not elsewhere classified   . Dysuria 01/18/2017  . Encounter for antineoplastic chemotherapy 11/30/2016  . GERD (gastroesophageal reflux disease)   . Goals of care, counseling/discussion 11/30/2016  . Headache    PMH: Migraines  . Herpes genitalia   . Hypertrophy of prostate without urinary obstruction and other lower urinary tract symptoms (LUTS)   . Hypertrophy of prostate without urinary obstruction and other lower urinary tract symptoms (LUTS)   .  Lack of coordination   . Lack of coordination   . Lumbago   . Nausea and vomiting 11/30/2016  . Osteoarthrosis, unspecified whether generalized or localized, unspecified site   . Osteoarthrosis, unspecified whether generalized or localized, unspecified site   . Other and unspecified  hyperlipidemia   . Other malaise and fatigue   . Pneumonia   . Reflux esophagitis   . Skin cancer   . Sleep apnea    does not wear CPAP  . Syncope and collapse   . Urinary frequency   . Vitamin D deficiency     SURGICAL HISTORY: Past Surgical History:  Procedure Laterality Date  . CARDIAC CATHETERIZATION    . COLONOSCOPY    . EYE SURGERY    . IR GENERIC HISTORICAL  12/04/2016   IR US GUIDE VASC ACCESS RIGHT 12/04/2016 Greggory Keen, MD [facility]  . IR GENERIC HISTORICAL  12/04/2016   IR FLUORO GUIDE PORT INSERTION RIGHT 12/04/2016 Greggory Keen, MD [facility]  . poly vocal cords  1985  . SKIN CANCER EXCISION  2011  . TONSILLECTOMY    . VASECTOMY    . VIDEO BRONCHOSCOPY WITH ENDOBRONCHIAL ULTRASOUND N/A 11/20/2016   Procedure: VIDEO BRONCHOSCOPY WITH ENDOBRONCHIAL ULTRASOUND;  Surgeon: Collene Gobble, MD;  Location: [facility];  Service: Thoracic;  Laterality: N/A;    SOCIAL HISTORY: Social History   Social History  . Marital status: Divorced    Spouse name: N/A  . Number of children: N/A  . Years of education: N/A   Occupational History  . Not on file.   Social History Main Topics  . Smoking status: Former Smoker    Packs/day: 1.00    Years: 30.00    Types: Cigarettes    Quit date: 08/12/2016  . Smokeless tobacco: Never Used     Comment: 3 per day  . Alcohol use No  . Drug use: No  . Sexual activity: Not Currently   Other Topics Concern  . Not on file   Social History Narrative  . No narrative on file    FAMILY HISTORY: Family History  Problem Relation Age of Onset  . Heart disease Father   . Cancer Father   . Cancer Brother   . Cancer Brother     Review of Systems  Constitutional: Positive for appetite change, fatigue and unexpected weight change. Negative for chills, diaphoresis and fever.  HENT:  Negative.   Eyes: Negative.   Respiratory: Positive for cough. Negative for hemoptysis.        Short of breath with exertion  Cardiovascular:  Negative.   Gastrointestinal: Negative.   Genitourinary: Negative.    Musculoskeletal: Negative.   Skin: Negative.   Neurological: Negative.   Hematological: Negative.   Psychiatric/Behavioral: Negative.       PHYSICAL EXAMINATION  ECOG PERFORMANCE STATUS: 1 - Symptomatic but completely ambulatory  Vitals:   06/09/17 1146  BP: 124/78  Pulse: 94  Resp: 17  Temp: 98.5 F (36.9 C)  SpO2: 95%    Physical Exam  Constitutional: He is oriented to person, place, and time and well-developed, well-nourished, and in no distress. No distress.  HENT:  Head: Normocephalic and atraumatic.  Mouth/Throat: Oropharynx is clear and moist. No oropharyngeal exudate.  Eyes: Pupils are equal, round, and reactive to light. Conjunctivae and EOM are normal. Right eye exhibits no discharge. Left eye exhibits no discharge. No scleral icterus.  Neck: Normal range of motion. Neck supple.  Cardiovascular: Normal rate, regular rhythm, normal heart  sounds and intact distal pulses.   Pulmonary/Chest: Effort normal and breath sounds normal. No respiratory distress. He has no wheezes. He has no rales.  Abdominal: Soft. Bowel sounds are normal. He exhibits no distension and no mass. There is no tenderness.  Musculoskeletal: Normal range of motion. He exhibits no edema.  Lymphadenopathy:    He has no cervical adenopathy.  Neurological: He is alert and oriented to person, place, and time. He exhibits normal muscle tone.  Skin: Skin is warm and dry. No rash noted. He is not diaphoretic. No erythema. No pallor.  Psychiatric: Mood, memory, affect and judgment normal.  Vitals reviewed.   LABORATORY DATA:  CBC    Component Value Date/Time   WBC 5.5 04/26/2017 0747   WBC 10.9 (H) 12/04/2016 0814   RBC 3.34 (L) 04/26/2017 0747   RBC 4.49 12/04/2016 0814   HGB 10.4 (L) 04/26/2017 0747   HCT 33.6 (L) 04/26/2017 0747   PLT 184 04/26/2017 0747   PLT 165 04/04/2015 1451   MCV 100.6 (H) 04/26/2017 0747   MCH  31.1 04/26/2017 0747   MCH 26.5 12/04/2016 0814   MCHC 31.0 (L) 04/26/2017 0747   MCHC 30.5 12/04/2016 0814   RDW 15.0 (H) 04/26/2017 0747   LYMPHSABS 0.8 (L) 04/26/2017 0747   MONOABS 0.8 04/26/2017 0747   EOSABS 0.2 04/26/2017 0747   EOSABS 0.1 04/04/2015 1451   BASOSABS 0.0 04/26/2017 0747    CMP     Component Value Date/Time   NA 141 04/26/2017 0747   K 3.9 04/26/2017 0747   CL 105 12/04/2016 0814   CO2 27 04/26/2017 0747   GLUCOSE 105 04/26/2017 0747   BUN 12.0 04/26/2017 0747   CREATININE 1.1 04/26/2017 0747   CALCIUM 9.4 04/26/2017 0747   PROT 6.8 04/26/2017 0747   ALBUMIN 3.4 (L) 04/26/2017 0747   AST 17 04/26/2017 0747   ALT 10 04/26/2017 0747   ALKPHOS 72 04/26/2017 0747   BILITOT 0.45 04/26/2017 0747   GFRNONAA >60 12/04/2016 0814   GFRNONAA 64 07/13/2016 1407   GFRAA >60 12/04/2016 0814   GFRAA 74 07/13/2016 1407    RADIOGRAPHIC STUDIES:  Mr Jeri Cos WU Contrast  Result Date: 06/05/2017 CLINICAL DATA:  82 year old male with limited stage small cell lung cancer. Restaging. EXAM: MRI HEAD WITHOUT AND WITH CONTRAST TECHNIQUE: Multiplanar, multiecho pulse sequences of the brain and surrounding structures were obtained without and with intravenous contrast. CONTRAST:  67mL MULTIHANCE GADOBENATE DIMEGLUMINE 529 MG/ML IV SOLN COMPARISON:  Brain MRI 12/05/2016. FINDINGS: Brain: Asymmetric pachymeningeal thickening and enhancement over the right superior convexity appears stable (series 11, image 33 and series 12, image 16). No other dural thickening identified. No other abnormal intracranial enhancement identified. Stable cerebral volume. No restricted diffusion to suggest acute infarction. No midline shift, mass effect, evidence of mass lesion, ventriculomegaly, extra-axial collection or acute intracranial hemorrhage. Cervicomedullary junction and pituitary are within normal limits. Stable gray and white matter signal throughout the brain. No chronic cerebral blood  products. Vascular: Major intracranial vascular flow voids are stable. Skull and upper cervical spine: Stable visualized cervical spine and spinal cord. Visualized bone marrow signal is within normal limits. Sinuses/Orbits: Stable and negative. Other: Visible internal auditory structures appear normal. Visible scalp and face soft tissues appear negative. IMPRESSION: 1. Mild smooth right superior convexity dural thickening and enhancement is noted, but is stable since February and appears benign. Perhaps this is the sequelae of prior trauma. Attention is directed on future restaging MRI. 2.  No metastatic disease or acute intracranial abnormality. Electronically Signed   By: Genevie Ann M.D.   On: 06/05/2017 06:48    ASSESSMENT and THERAPY PLAN:   Small cell lung cancer, left [facility]) This is a very pleasant 82 year old white male with limited stage small cell lung cancer and currently undergoing systemic chemotherapy with carboplatin and etoposide status post 6 cycles. The patient tolerated his treatment well except for fatigue secondary to chemotherapy-induced anemia. CT scan following treatment showed improvement. A recommendation for evaluation by radiation oncology for consideration of prophylactic brain irradiation was recommended. The patient declined due to concerns over dementia.  The patient recently developed some anger issues and MRI of the brain was ordered.  Patient was seen with Dr. Julien Nordmann. MRI of the brain was reviewed with the patient and his son. There is no evidence of brain metastasis. The patient remains stable from our standpoint.  The patient does admit to having seen a psychiatrist in the past. Recommend that he follow up with a psychiatrist for medication adjustment. He is currently on Lexapro 5 mg daily and  Seroquel at bedtime.  Patient will keep his follow-up in mid October with restaging CT scans prior to that visit.  The patient was instructed to call us if he has any bone  pain, increased shortness of breath, or hemoptysis and we can certainly see him sooner.    No orders of the defined types were placed in this encounter.   All questions were answered. The patient knows to call the clinic with any problems, questions or concerns. We can certainly see the patient much sooner if necessary.  Mikey Bussing, NP 06/09/2017   ADDENDUM: Hematology/Oncology Attending: I had a face to face encounter with the patient. I recommended his care plan. This is a very pleasant 82 years old white male with limited stage small cell lung cancer status post course of systemic chemotherapy concurrent with radiation and tolerated his treatment well except for fatigue. He has almost complete response after his treatment. The family called the office was concerned about his mental status and anger issues as well as slow recovery from his previous fatigue. We will order MRI of the brain to rule out any metastatic disease to the brain since the patient declined prophylactic cranial irradiation. The MRI was unremarkable for any metastatic disease to the brain. The patient has a lot of psychological issues and he is currently on treatment with Seroquel and Lexapro by his primary care physician. I strongly recommended for the patient to discuss with his primary care physician referral to psychiatrist for evaluation and management of his anger issues. He understands that recovery from previous adverse effect of chemotherapy takes some time. We will continue to monitor him closely. He has repeat CT scan of the chest scheduled for October 2018. The patient will come back for follow-up visit at that time. The patient and his son agreed to the current plan. He was advised to call immediately if he has any other concerning symptoms in the interval.  Disclaimer: This note was dictated with voice recognition software. Similar sounding words can inadvertently be transcribed and may be missed upon  review. Eilleen Kempf, MD 06/11/17

## 2017-06-09 NOTE — Assessment & Plan Note (Signed)
This is a very pleasant 82 year old white male with limited stage small cell lung cancer and currently undergoing systemic chemotherapy with carboplatin and etoposide status post 6 cycles. The patient tolerated his treatment well except for fatigue secondary to chemotherapy-induced anemia. CT scan following treatment showed improvement. A recommendation for evaluation by radiation oncology for consideration of prophylactic brain irradiation was recommended. The patient declined due to concerns over dementia.  The patient recently developed some anger issues and MRI of the brain was ordered.  Patient was seen with Dr. Julien Nordmann. MRI of the brain was reviewed with the patient and his son. There is no evidence of brain metastasis. The patient remains stable from our standpoint.  The patient does admit to having seen a psychiatrist in the past. Recommend that he follow up with a psychiatrist for medication adjustment. He is currently on Lexapro 5 mg daily and  Seroquel at bedtime.  Patient will keep his follow-up in mid October with restaging CT scans prior to that visit.  The patient was instructed to call us if he has any bone pain, increased shortness of breath, or hemoptysis and we can certainly see him sooner.

## 2017-06-10 ENCOUNTER — Telehealth: Payer: Self-pay | Admitting: Oncology

## 2017-06-10 NOTE — Telephone Encounter (Signed)
Per 8/29 no los °

## 2017-06-15 ENCOUNTER — Encounter: Payer: Self-pay | Admitting: Psychiatry

## 2017-06-15 ENCOUNTER — Ambulatory Visit (INDEPENDENT_AMBULATORY_CARE_PROVIDER_SITE_OTHER): Payer: Medicare Other | Admitting: Psychiatry

## 2017-06-15 VITALS — BP 117/70 | HR 94 | Temp 98.4°F

## 2017-06-15 DIAGNOSIS — F339 Major depressive disorder, recurrent, unspecified: Secondary | ICD-10-CM

## 2017-06-15 MED ORDER — QUETIAPINE FUMARATE 25 MG PO TABS: 25.0000 mg | ORAL_TABLET | Freq: Every day | ORAL | 3 refills | Status: DC

## 2017-06-15 MED ORDER — ESCITALOPRAM OXALATE 5 MG PO TABS: ORAL_TABLET | ORAL | 2 refills | Status: DC

## 2017-06-15 NOTE — Progress Notes (Signed)
Psychiatric MD Progress Note   Patient Identification: Christian Munoz MRN:  [id number] Date of Evaluation:  06/15/2017 Referral Source: [facility]  Chief Complaint:   Chief Complaint    Follow-up; Medication Refill     Visit Diagnosis:    ICD-10-CM   1. Major depression, recurrent, chronic ([facility]) F33.9     History of Present Illness:    Patient is a 82 year old male who presented for follow-up. He reported that he Was diagnosed with small cell carcinoma in December and was going through  radiation . He reported that he has metastases in his brain as well. He stated that he completed his treatment in June. He currently lives by himself. Patient reported that his son has been helping him and his daughter also came from [place]. Patient reported that he currently feels tired. He has been taking his medication including Seroquel and Lexapro. He reported that the Seroquel helps him with sleep at night. He reported that he gets angry and upset about the political situation in the country. Sometimes he has episodes of anger related to the same. He reported that he was politically  involved when he was young and that keeps him upset about the current situation. Patient reported that his son has brought him for the appointment. He stated that he sleeps well on certain days and at other nights he does not sleep well and catches up on the other days. He stated that he does not have any suicidal ideations or plans. We discussed about his medications in detail and he appeared compliant.      He was calm and alert throughout the interview.  Associated Signs/Symptoms: Depression Symptoms:  depressed mood, fatigue, anxiety, disturbed sleep, (Hypo) Manic Symptoms:  none Anxiety Symptoms:  Excessive Worry, Psychotic Symptoms:  none PTSD Symptoms: Negative NA  Past Psychiatric History:  Psychiatric Hospitalization x 2-  1981- Alcohol use 1968- Depression , anxiety  Previous Psychotropic  Medications: Duloxetine Bupropion Trintillex Lorazepam  Doxepin Venlafaxine Melatonin   Substance Abuse History in the last 12 months:  No.  Consequences of Substance Abuse: Negative NA  Past Medical History:  Past Medical History:  Diagnosis Date  . Allergic rhinitis due to pollen   . Anginal pain ([place])    Past medical history " does not seem to be a problem at all now."  . Cancer associated pain 02/08/2017  . Coronary atherosclerosis of native coronary artery   . Coronary atherosclerosis of native coronary artery   . Depressive disorder, not elsewhere classified   . Dysuria 01/18/2017  . Encounter for antineoplastic chemotherapy 11/30/2016  . GERD (gastroesophageal reflux disease)   . Goals of care, counseling/discussion 11/30/2016  . Headache    PMH: Migraines  . Herpes genitalia   . Hypertrophy of prostate without urinary obstruction and other lower urinary tract symptoms (LUTS)   . Hypertrophy of prostate without urinary obstruction and other lower urinary tract symptoms (LUTS)   . Lack of coordination   . Lack of coordination   . Lumbago   . Nausea and vomiting 11/30/2016  . Osteoarthrosis, unspecified whether generalized or localized, unspecified site   . Osteoarthrosis, unspecified whether generalized or localized, unspecified site   . Other and unspecified hyperlipidemia   . Other malaise and fatigue   . Pneumonia   . Reflux esophagitis   . Skin cancer   . Sleep apnea    does not wear CPAP  . Syncope and collapse   . Urinary frequency   . Vitamin D  deficiency     Past Surgical History:  Procedure Laterality Date  . CARDIAC CATHETERIZATION    . COLONOSCOPY    . EYE SURGERY    . IR GENERIC HISTORICAL  12/04/2016   IR US GUIDE VASC ACCESS RIGHT 12/04/2016 Greggory Keen, MD [facility]  . IR GENERIC HISTORICAL  12/04/2016   IR FLUORO GUIDE PORT INSERTION RIGHT 12/04/2016 Greggory Keen, MD [facility]  . poly vocal cords  1985  . SKIN CANCER EXCISION  2011   . TONSILLECTOMY    . VASECTOMY    . VIDEO BRONCHOSCOPY WITH ENDOBRONCHIAL ULTRASOUND N/A 11/20/2016   Procedure: VIDEO BRONCHOSCOPY WITH ENDOBRONCHIAL ULTRASOUND;  Surgeon: Collene Gobble, MD;  Location: [facility] OR;  Service: Thoracic;  Laterality: N/A;    Family Psychiatric History:  Denied   Family History:  Family History  Problem Relation Age of Onset  . Heart disease Father   . Cancer Father   . Cancer Brother   . Cancer Brother     Social History:   Social History   Social History  . Marital status: Divorced    Spouse name: N/A  . Number of children: N/A  . Years of education: N/A   Social History Main Topics  . Smoking status: Former Smoker    Packs/day: 1.00    Years: 30.00    Types: Cigarettes    Quit date: 08/12/2016  . Smokeless tobacco: Never Used     Comment: 3 per day  . Alcohol use No  . Drug use: No  . Sexual activity: Not Currently   Other Topics Concern  . None   Social History Narrative  . None    Additional Social History:  Divorced, Lives in [place].   Allergies:   Allergies  Allergen Reactions  . Chantix [Varenicline] Other (See Comments)    Makes patient suicidal  . Zoloft [Sertraline Hcl] Other (See Comments)    Makes patient suicidal    Metabolic Disorder Labs: Lab Results  Component Value Date   HGBA1C 5.7 (H) 07/13/2016   MPG 117 07/13/2016   No results found for: PROLACTIN Lab Results  Component Value Date   CHOL 114 02/15/2017   TRIG 53 02/15/2017   HDL 47 02/15/2017   CHOLHDL 3.3 12/10/2015   LDLCALC 56 02/15/2017   LDLCALC 70 12/10/2015     Current Medications: Current Outpatient Prescriptions  Medication Sig Dispense Refill  . aspirin 81 MG tablet Take 81 mg by mouth daily. Take one tablet once a day    . escitalopram (LEXAPRO) 5 MG tablet TAKE 1 TABLET(5 MG) BY MOUTH EVERY MORNING 30 tablet 2  . fish oil-omega-3 fatty acids 1000 MG capsule Take 2 g by mouth daily. Take one tablet twice a day    .  gabapentin (NEURONTIN) 300 MG capsule Take 1 capsule (300 mg total) by mouth 3 (three) times daily. 90 capsule 3  . Multiple Vitamins-Minerals (MULTIVITAMIN WITH MINERALS) tablet Take 1 tablet by mouth daily.    Marland Kitchen oxyCODONE-acetaminophen (PERCOCET/ROXICET) 5-325 MG tablet Take 1 tablet by mouth every 6 (six) hours as needed for severe pain. 30 tablet 0  . pantoprazole (PROTONIX) 20 MG tablet TAKE 1 TABLET(20 MG) BY MOUTH DAILY 90 tablet 1  . QUEtiapine (SEROQUEL) 25 MG tablet TAKE 1/2- 1 TABLET BY MOUTH EVERY NIGHT AT BEDTIME 30 tablet 3  . simvastatin (ZOCOR) 20 MG tablet TAKE 1 TABLET(20 MG) BY MOUTH DAILY 90 tablet 0  . tamsulosin (FLOMAX) 0.4 MG CAPS capsule TAKE  1 CAPSULE(0.4 MG) BY MOUTH DAILY 90 capsule 1   No current facility-administered medications for this visit.     Neurologic: Headache: No Seizure: No Paresthesias:No  Musculoskeletal: Strength & Muscle Tone: within normal limits Gait & Station: normal Patient leans: N/A  Psychiatric Specialty Exam: ROS  Blood pressure 117/70, pulse 94, temperature 98.4 F (36.9 C), temperature source Oral.There is no height or weight on file to calculate BMI.  General Appearance: Casual  Eye Contact:  Fair  Speech:  Clear and Coherent  Volume:  Normal  Mood:  Anxious  Affect:  Congruent  Thought Process:  Coherent and Goal Directed  Orientation:  Full (Time, Place, and Person)  Thought Content:  WDL  Suicidal Thoughts:  No  Homicidal Thoughts:  No  Memory:  Immediate;   Fair Recent;   Fair Remote;   Fair  Judgement:  Fair  Insight:  Fair  Psychomotor Activity:  Normal  Concentration:  Concentration: Fair and Attention Span: Fair  Recall:  [employer] of Knowledge:Fair  Language: Fair  Akathisia:  No  Handed:  Right  AIMS (if indicated):    Assets:  Communication Skills Desire for Improvement Physical Health  ADL's:  Intact  Cognition: WNL  Sleep: poor    Treatment Plan Summary: Medication management   Discussed  with him about his medications at length. He reported that he has tried several medications in the past.   Lexapro 5 mg every morning  Seroquel 25 mg by mouth daily at bedtime and he agreed with the plan. Follow-up in 2 month or earlier depending on his symptoms     More than 50% of the time spent in psychoeducation, counseling and coordination of care.    This note was generated in part or whole with voice recognition software. Voice regonition is usually quite accurate but there are transcription errors that can and very often do occur. I apologize for any typographical errors that were not detected and corrected.   Rainey Pines, MD 9/4/20181:48 PM

## 2017-06-18 ENCOUNTER — Ambulatory Visit (HOSPITAL_BASED_OUTPATIENT_CLINIC_OR_DEPARTMENT_OTHER): Payer: Medicare Other

## 2017-06-18 DIAGNOSIS — Z95828 Presence of other vascular implants and grafts: Secondary | ICD-10-CM

## 2017-06-18 DIAGNOSIS — Z85118 Personal history of other malignant neoplasm of bronchus and lung: Secondary | ICD-10-CM

## 2017-06-18 DIAGNOSIS — Z452 Encounter for adjustment and management of vascular access device: Secondary | ICD-10-CM

## 2017-06-18 MED ORDER — SODIUM CHLORIDE 0.9% FLUSH
10.0000 mL | INTRAVENOUS | Status: DC | PRN
  Administered 2017-06-18: 10 mL via INTRAVENOUS
  Filled 2017-06-18: qty 10

## 2017-06-18 MED ORDER — HEPARIN SOD (PORK) LOCK FLUSH 100 UNIT/ML IV SOLN
500.0000 [IU] | Freq: Once | INTRAVENOUS | Status: AC | PRN
  Administered 2017-06-18: 500 [IU] via INTRAVENOUS
  Filled 2017-06-18: qty 5

## 2017-07-13 ENCOUNTER — Encounter (HOSPITAL_COMMUNITY): Payer: Self-pay | Admitting: *Deleted

## 2017-07-13 ENCOUNTER — Emergency Department (HOSPITAL_COMMUNITY): Payer: Medicare Other

## 2017-07-13 ENCOUNTER — Observation Stay (HOSPITAL_BASED_OUTPATIENT_CLINIC_OR_DEPARTMENT_OTHER): Payer: Medicare Other

## 2017-07-13 ENCOUNTER — Inpatient Hospital Stay (HOSPITAL_COMMUNITY)
Admission: EM | Admit: 2017-07-13 | Discharge: 2017-07-15 | DRG: 175 | Disposition: A | Discharge status: Home / Self Care | Payer: Medicare Other | Attending: Internal Medicine | Admitting: Internal Medicine

## 2017-07-13 DIAGNOSIS — G8929 Other chronic pain: Secondary | ICD-10-CM

## 2017-07-13 DIAGNOSIS — J9691 Respiratory failure, unspecified with hypoxia: Secondary | ICD-10-CM | POA: Diagnosis present

## 2017-07-13 DIAGNOSIS — N390 Urinary tract infection, site not specified: Secondary | ICD-10-CM | POA: Diagnosis not present

## 2017-07-13 DIAGNOSIS — Z7982 Long term (current) use of aspirin: Secondary | ICD-10-CM

## 2017-07-13 DIAGNOSIS — E785 Hyperlipidemia, unspecified: Secondary | ICD-10-CM | POA: Diagnosis present

## 2017-07-13 DIAGNOSIS — K2100 Gastro-esophageal reflux disease with esophagitis, without bleeding: Secondary | ICD-10-CM | POA: Diagnosis present

## 2017-07-13 DIAGNOSIS — I4891 Unspecified atrial fibrillation: Secondary | ICD-10-CM

## 2017-07-13 DIAGNOSIS — I482 Chronic atrial fibrillation, unspecified: Secondary | ICD-10-CM

## 2017-07-13 DIAGNOSIS — N401 Enlarged prostate with lower urinary tract symptoms: Secondary | ICD-10-CM | POA: Diagnosis not present

## 2017-07-13 DIAGNOSIS — C3492 Malignant neoplasm of unspecified part of left bronchus or lung: Secondary | ICD-10-CM | POA: Diagnosis present

## 2017-07-13 DIAGNOSIS — G893 Neoplasm related pain (acute) (chronic): Secondary | ICD-10-CM | POA: Diagnosis present

## 2017-07-13 DIAGNOSIS — F329 Major depressive disorder, single episode, unspecified: Secondary | ICD-10-CM | POA: Diagnosis present

## 2017-07-13 DIAGNOSIS — M1990 Unspecified osteoarthritis, unspecified site: Secondary | ICD-10-CM | POA: Diagnosis present

## 2017-07-13 DIAGNOSIS — Z87891 Personal history of nicotine dependence: Secondary | ICD-10-CM

## 2017-07-13 DIAGNOSIS — I2510 Atherosclerotic heart disease of native coronary artery without angina pectoris: Secondary | ICD-10-CM | POA: Diagnosis present

## 2017-07-13 DIAGNOSIS — I2609 Other pulmonary embolism with acute cor pulmonale: Secondary | ICD-10-CM | POA: Diagnosis not present

## 2017-07-13 DIAGNOSIS — N138 Other obstructive and reflux uropathy: Secondary | ICD-10-CM | POA: Diagnosis present

## 2017-07-13 DIAGNOSIS — J439 Emphysema, unspecified: Secondary | ICD-10-CM | POA: Diagnosis present

## 2017-07-13 DIAGNOSIS — J301 Allergic rhinitis due to pollen: Secondary | ICD-10-CM | POA: Diagnosis present

## 2017-07-13 DIAGNOSIS — Z85828 Personal history of other malignant neoplasm of skin: Secondary | ICD-10-CM

## 2017-07-13 DIAGNOSIS — R0902 Hypoxemia: Secondary | ICD-10-CM

## 2017-07-13 DIAGNOSIS — C3490 Malignant neoplasm of unspecified part of unspecified bronchus or lung: Secondary | ICD-10-CM

## 2017-07-13 DIAGNOSIS — Z809 Family history of malignant neoplasm, unspecified: Secondary | ICD-10-CM

## 2017-07-13 DIAGNOSIS — Y842 Radiological procedure and radiotherapy as the cause of abnormal reaction of the patient, or of later complication, without mention of misadventure at the time of the procedure: Secondary | ICD-10-CM | POA: Diagnosis present

## 2017-07-13 DIAGNOSIS — Z8249 Family history of ischemic heart disease and other diseases of the circulatory system: Secondary | ICD-10-CM

## 2017-07-13 DIAGNOSIS — I2699 Other pulmonary embolism without acute cor pulmonale: Secondary | ICD-10-CM | POA: Diagnosis not present

## 2017-07-13 DIAGNOSIS — Z888 Allergy status to other drugs, medicaments and biological substances status: Secondary | ICD-10-CM

## 2017-07-13 DIAGNOSIS — E559 Vitamin D deficiency, unspecified: Secondary | ICD-10-CM | POA: Diagnosis present

## 2017-07-13 DIAGNOSIS — D649 Anemia, unspecified: Secondary | ICD-10-CM

## 2017-07-13 DIAGNOSIS — I700 Atherosclerosis of aorta: Secondary | ICD-10-CM | POA: Diagnosis present

## 2017-07-13 DIAGNOSIS — I480 Paroxysmal atrial fibrillation: Secondary | ICD-10-CM | POA: Diagnosis present

## 2017-07-13 DIAGNOSIS — J700 Acute pulmonary manifestations due to radiation: Secondary | ICD-10-CM | POA: Diagnosis present

## 2017-07-13 LAB — MRSA PCR SCREENING: MRSA BY PCR: NEGATIVE

## 2017-07-13 LAB — URINALYSIS, ROUTINE W REFLEX MICROSCOPIC
BILIRUBIN URINE: NEGATIVE
GLUCOSE, UA: NEGATIVE mg/dL
Hgb urine dipstick: NEGATIVE
KETONES UR: NEGATIVE mg/dL
Nitrite: POSITIVE — AB
PROTEIN: 100 mg/dL — AB
Specific Gravity, Urine: 1.011 (ref 1.005–1.030)
pH: 6 (ref 5.0–8.0)

## 2017-07-13 LAB — LIPASE, BLOOD: LIPASE: 31 U/L (ref 11–51)

## 2017-07-13 LAB — CBC
HCT: 38 % — ABNORMAL LOW (ref 39.0–52.0)
HEMOGLOBIN: 11.5 g/dL — AB (ref 13.0–17.0)
MCH: 25.4 pg — AB (ref 26.0–34.0)
MCHC: 30.3 g/dL (ref 30.0–36.0)
MCV: 84.1 fL (ref 78.0–100.0)
Platelets: 207 10*3/uL (ref 150–400)
RBC: 4.52 MIL/uL (ref 4.22–5.81)
RDW: 17.2 % — ABNORMAL HIGH (ref 11.5–15.5)
WBC: 8.2 10*3/uL (ref 4.0–10.5)

## 2017-07-13 LAB — D-DIMER, QUANTITATIVE: D-Dimer, Quant: 1.32 ug/mL-FEU — ABNORMAL HIGH (ref 0.00–0.50)

## 2017-07-13 LAB — T4, FREE: Free T4: 1 ng/dL (ref 0.61–1.12)

## 2017-07-13 LAB — COMPREHENSIVE METABOLIC PANEL
ALT: 11 U/L — ABNORMAL LOW (ref 17–63)
ANION GAP: 10 (ref 5–15)
AST: 37 U/L (ref 15–41)
Albumin: 3.5 g/dL (ref 3.5–5.0)
Alkaline Phosphatase: 64 U/L (ref 38–126)
BUN: 17 mg/dL (ref 6–20)
CHLORIDE: 102 mmol/L (ref 101–111)
CO2: 25 mmol/L (ref 22–32)
Calcium: 9.3 mg/dL (ref 8.9–10.3)
Creatinine, Ser: 1.15 mg/dL (ref 0.61–1.24)
GFR calc non Af Amer: 59 mL/min — ABNORMAL LOW (ref >60)
Glucose, Bld: 98 mg/dL (ref 65–99)
Potassium: 3.8 mmol/L (ref 3.5–5.1)
SODIUM: 137 mmol/L (ref 135–145)
Total Bilirubin: 0.8 mg/dL (ref 0.3–1.2)
Total Protein: 7 g/dL (ref 6.5–8.1)

## 2017-07-13 LAB — TROPONIN I: Troponin I: <0.03 ng/mL (ref <0.03)

## 2017-07-13 LAB — TSH: TSH: 4.248 u[IU]/mL (ref 0.350–4.500)

## 2017-07-13 LAB — ECHOCARDIOGRAM COMPLETE
HEIGHTINCHES: 70 in
Weight: 2528 oz

## 2017-07-13 LAB — BRAIN NATRIURETIC PEPTIDE: B NATRIURETIC PEPTIDE 5: 158.1 pg/mL — AB (ref 0.0–100.0)

## 2017-07-13 MED ORDER — CEPHALEXIN 250 MG PO CAPS
500.0000 mg | ORAL_CAPSULE | Freq: Once | ORAL | Status: AC
  Administered 2017-07-13: 500 mg via ORAL
  Filled 2017-07-13: qty 2

## 2017-07-13 MED ORDER — SIMVASTATIN 20 MG PO TABS
20.0000 mg | ORAL_TABLET | Freq: Every day | ORAL | Status: DC
  Administered 2017-07-13 – 2017-07-14 (×2): 20 mg via ORAL
  Filled 2017-07-13 (×3): qty 1

## 2017-07-13 MED ORDER — IOPAMIDOL (ISOVUE-370) INJECTION 76%
INTRAVENOUS | Status: AC
  Administered 2017-07-13: 100 mL
  Filled 2017-07-13: qty 100

## 2017-07-13 MED ORDER — SODIUM CHLORIDE 0.9 % IV SOLN: 250.0000 mL | INTRAVENOUS | Status: DC | PRN

## 2017-07-13 MED ORDER — TAMSULOSIN HCL 0.4 MG PO CAPS
0.4000 mg | ORAL_CAPSULE | Freq: Every day | ORAL | Status: DC
  Administered 2017-07-13 – 2017-07-15 (×3): 0.4 mg via ORAL
  Filled 2017-07-13 (×3): qty 1

## 2017-07-13 MED ORDER — HEPARIN BOLUS VIA INFUSION
4000.0000 [IU] | Freq: Once | INTRAVENOUS | Status: AC
  Administered 2017-07-13: 4000 [IU] via INTRAVENOUS
  Filled 2017-07-13: qty 4000

## 2017-07-13 MED ORDER — METOPROLOL TARTRATE 25 MG PO TABS: 25.0000 mg | ORAL_TABLET | Freq: Two times a day (BID) | ORAL | Status: DC

## 2017-07-13 MED ORDER — SODIUM CHLORIDE 0.9% FLUSH: 3.0000 mL | INTRAVENOUS | Status: DC | PRN

## 2017-07-13 MED ORDER — HEPARIN (PORCINE) IN NACL 100-0.45 UNIT/ML-% IJ SOLN
1200.0000 [IU]/h | INTRAMUSCULAR | Status: DC
  Administered 2017-07-13: 1200 [IU]/h via INTRAVENOUS
  Filled 2017-07-13: qty 250

## 2017-07-13 MED ORDER — CARVEDILOL 3.125 MG PO TABS
3.1250 mg | ORAL_TABLET | Freq: Two times a day (BID) | ORAL | Status: DC
  Administered 2017-07-13 – 2017-07-15 (×3): 3.125 mg via ORAL
  Filled 2017-07-13 (×3): qty 1

## 2017-07-13 MED ORDER — APIXABAN 5 MG PO TABS: 5.0000 mg | ORAL_TABLET | Freq: Two times a day (BID) | ORAL | Status: DC

## 2017-07-13 MED ORDER — METOPROLOL TARTRATE 5 MG/5ML IV SOLN: 2.5000 mg | INTRAVENOUS | Status: DC | PRN

## 2017-07-13 MED ORDER — OXYCODONE-ACETAMINOPHEN 5-325 MG PO TABS: 1.0000 | ORAL_TABLET | Freq: Four times a day (QID) | ORAL | Status: DC | PRN

## 2017-07-13 MED ORDER — METOPROLOL TARTRATE 25 MG PO TABS
25.0000 mg | ORAL_TABLET | Freq: Once | ORAL | Status: AC
  Administered 2017-07-13: 25 mg via ORAL
  Filled 2017-07-13: qty 1

## 2017-07-13 MED ORDER — PANTOPRAZOLE SODIUM 20 MG PO TBEC
20.0000 mg | DELAYED_RELEASE_TABLET | Freq: Every day | ORAL | Status: DC
  Administered 2017-07-13 – 2017-07-15 (×3): 20 mg via ORAL
  Filled 2017-07-13 (×3): qty 1

## 2017-07-13 MED ORDER — APIXABAN 5 MG PO TABS
10.0000 mg | ORAL_TABLET | Freq: Two times a day (BID) | ORAL | Status: DC
  Administered 2017-07-13 – 2017-07-15 (×5): 10 mg via ORAL
  Filled 2017-07-13 (×6): qty 2

## 2017-07-13 MED ORDER — GABAPENTIN 300 MG PO CAPS
300.0000 mg | ORAL_CAPSULE | Freq: Three times a day (TID) | ORAL | Status: DC
  Administered 2017-07-13 – 2017-07-15 (×6): 300 mg via ORAL
  Filled 2017-07-13 (×6): qty 1

## 2017-07-13 MED ORDER — QUETIAPINE FUMARATE 50 MG PO TABS
25.0000 mg | ORAL_TABLET | Freq: Every day | ORAL | Status: DC
  Administered 2017-07-13 – 2017-07-14 (×2): 25 mg via ORAL
  Filled 2017-07-13 (×3): qty 1

## 2017-07-13 MED ORDER — ACETAMINOPHEN 650 MG RE SUPP: 650.0000 mg | Freq: Four times a day (QID) | RECTAL | Status: DC | PRN

## 2017-07-13 MED ORDER — SODIUM CHLORIDE 0.9 % IV BOLUS (SEPSIS)
1000.0000 mL | Freq: Once | INTRAVENOUS | Status: AC
  Administered 2017-07-13: 1000 mL via INTRAVENOUS

## 2017-07-13 MED ORDER — ASPIRIN EC 81 MG PO TBEC
81.0000 mg | DELAYED_RELEASE_TABLET | Freq: Every day | ORAL | Status: DC
  Administered 2017-07-13 – 2017-07-15 (×3): 81 mg via ORAL
  Filled 2017-07-13 (×3): qty 1

## 2017-07-13 MED ORDER — ACETAMINOPHEN 325 MG PO TABS: 650.0000 mg | ORAL_TABLET | Freq: Four times a day (QID) | ORAL | Status: DC | PRN

## 2017-07-13 MED ORDER — ONDANSETRON HCL 4 MG/2ML IJ SOLN: 4.0000 mg | Freq: Four times a day (QID) | INTRAMUSCULAR | Status: DC | PRN

## 2017-07-13 MED ORDER — ESCITALOPRAM OXALATE 10 MG PO TABS
5.0000 mg | ORAL_TABLET | Freq: Every day | ORAL | Status: DC
  Administered 2017-07-13 – 2017-07-15 (×3): 5 mg via ORAL
  Filled 2017-07-13 (×3): qty 1

## 2017-07-13 MED ORDER — ONDANSETRON HCL 4 MG PO TABS: 4.0000 mg | ORAL_TABLET | Freq: Four times a day (QID) | ORAL | Status: DC | PRN

## 2017-07-13 MED ORDER — SODIUM CHLORIDE 0.9% FLUSH
3.0000 mL | Freq: Two times a day (BID) | INTRAVENOUS | Status: DC
  Administered 2017-07-13 – 2017-07-15 (×5): 3 mL via INTRAVENOUS

## 2017-07-13 NOTE — ED Provider Notes (Signed)
7:45 AM Assumed care from Dr. Kathrynn Humble, please see their note for full history, physical and decision making until this point. In brief this is a 82 y.o. year old male who presented to the ED tonight with Abdominal Pain     Afib RVR with UTI. Pending CT angio PE and cards for recommendations.   8:11 AM Have not heard from cardiology. Will repage. reeval and patient still afib rvr with rates in 120-135 range. No distress otherwise. Just received metoprolol around 0757, will wait for longer to see if any result. Still pending CT angio.   Discussed with cardiology who will see patient.   CTA w/ e/o PE. Intensivist, Dr. Titus Mould, consult it and evaluated the patient. They recommended bilateral lower extremity Dopplers, echo but do not feel he the patient is a lysis candidate secondary to comorbidities and questionable brain metastasis at this time. They state the patient is okay for heparin and stepped on admission.  Discussed with the medicine team, Dr. Barbaraann Faster, who will admit patient to stepdown. Patient hemodynamic stable this time. On heparin.   CRITICAL CARE Performed by: Merrily Pew Total critical care time: 35 minutes Critical care time was exclusive of separately billable procedures and treating other patients. Critical care was necessary to treat or prevent imminent or life-threatening deterioration. Critical care was time spent personally by me on the following activities: development of treatment plan with patient and/or surrogate as well as nursing, discussions with consultants, evaluation of patient's response to treatment, examination of patient, obtaining history from patient or surrogate, ordering and performing treatments and interventions, ordering and review of laboratory studies, ordering and review of radiographic studies, pulse oximetry and re-evaluation of patient's condition.   Labs, studies and imaging reviewed by myself and considered in medical decision making if  ordered. Imaging interpreted by radiology.  Labs Reviewed  COMPREHENSIVE METABOLIC PANEL - Abnormal; Notable for the following:       Result Value   ALT 11 (*)    GFR calc non Af Amer 59 (*)    All other components within normal limits  CBC - Abnormal; Notable for the following:    Hemoglobin 11.5 (*)    HCT 38.0 (*)    MCH 25.4 (*)    RDW 17.2 (*)    All other components within normal limits  URINALYSIS, ROUTINE W REFLEX MICROSCOPIC - Abnormal; Notable for the following:    Color, Urine AMBER (*)    APPearance HAZY (*)    Protein, ur 100 (*)    Nitrite POSITIVE (*)    Leukocytes, UA MODERATE (*)    Bacteria, UA RARE (*)    Squamous Epithelial / LPF 0-5 (*)    All other components within normal limits  D-DIMER, QUANTITATIVE (NOT AT [facility]) - Abnormal; Notable for the following:    D-Dimer, Quant 1.32 (*)    All other components within normal limits  URINE CULTURE  LIPASE, BLOOD    CT Angio Chest PE W and/or Wo Contrast    (Results Pending)    No Follow-up on file.    Merrily Pew, MD 07/13/17 [phone]

## 2017-07-13 NOTE — Consult Note (Signed)
Name: Christian Munoz MRN: [id number] DOB: 02/26/1935    ADMISSION DATE:  07/13/2017 CONSULTATION DATE:  07/13/2017  REFERRING MD :  Dr. Marily Memos   CHIEF COMPLAINT:  PE  HISTORY OF PRESENT ILLNESS:   82 year old male with PMH of CAD, BPH, GERD, small cell lung CA with involved mediastinal adenopathy (completed carboplatin and etoposide June)  Presented to ED on 10/2 with abdominal pain. Found to be in A.Fib. CTA with new PE with RV/LV 1.0. Started on heparin gtt. PCCM asked to consult.   SIGNIFICANT EVENTS  10/2 > Presented to ED   STUDIES:  MR Brain 06/05/17 > 1. Mild smooth right superior convexity dural thickening and enhancement is noted, but is stable since February and appears benign. Perhaps this is the sequelae of prior trauma. Attention is directed on future restaging MRI. 2.  No metastatic disease or acute intracranial abnormality. CTA Chest 10/2 > 1. Chronically attenuated pulmonary vessels in the left lower lobe, with probable new filling defects in the medial and posterior basal segmental and subsegmental arteries, suspicious for pulmonary emboli. There is CT evidence of right heart strain (RV/LV Ratio = 1.0) consistent with at least submassive (intermediate risk) PE. The presence of right heart strain has been associated with an increased risk of morbidity and mortality. Please activate Code PE by paging [phone]. 2. Slight interval decrease in size of left infrahilar mass. Adjacent left lower lobe pulmonary opacities are also similar, likely representing postobstructive and radiation pneumonitis. 3. Slightly increased bronchiectasis and pulmonary opacities in the paramediastinal left upper lobe, likely reflecting radiation pneumonitis. 4.  Aortic atherosclerosis (ICD10-I70.0). 5.  Emphysema (ICD10-J43.9).  PAST MEDICAL HISTORY :   has a past medical history of Allergic rhinitis due to pollen; Anginal pain ([place]); Cancer associated pain (02/08/2017);  Coronary atherosclerosis of native coronary artery; Coronary atherosclerosis of native coronary artery; Depressive disorder, not elsewhere classified; Dysuria (01/18/2017); Encounter for antineoplastic chemotherapy (11/30/2016); GERD (gastroesophageal reflux disease); Goals of care, counseling/discussion (11/30/2016); Headache; Herpes genitalia; Hypertrophy of prostate without urinary obstruction and other lower urinary tract symptoms (LUTS); Hypertrophy of prostate without urinary obstruction and other lower urinary tract symptoms (LUTS); Lack of coordination; Lack of coordination; Lumbago; Nausea and vomiting (11/30/2016); Osteoarthrosis, unspecified whether generalized or localized, unspecified site; Osteoarthrosis, unspecified whether generalized or localized, unspecified site; Other and unspecified hyperlipidemia; Other malaise and fatigue; Pneumonia; Reflux esophagitis; Skin cancer; Sleep apnea; Syncope and collapse; Urinary frequency; and Vitamin D deficiency.  has a past surgical history that includes poly vocal cords (1985); Skin cancer excision (2011); Cardiac catheterization; Vasectomy; Eye surgery; Tonsillectomy; Colonoscopy; Video bronchoscopy with endobronchial ultrasound (N/A, 11/20/2016); ir generic historical (12/04/2016); and ir generic historical (12/04/2016). Prior to Admission medications   Medication Sig Start Date End Date Taking? Authorizing Provider  aspirin 81 MG tablet Take 81 mg by mouth daily. Take one tablet once a day   Yes [provider]  escitalopram (LEXAPRO) 5 MG tablet TAKE 1 TABLET(5 MG) BY MOUTH EVERY MORNING 06/15/17  Yes Rainey Pines, MD  fish oil-omega-3 fatty acids 1000 MG capsule Take 2 g by mouth daily.    Yes [provider]  gabapentin (NEURONTIN) 300 MG capsule Take 1 capsule (300 mg total) by mouth 3 (three) times daily. 04/08/17  Yes [name], NP  Glucosamine-Chondroitin (GLUCOSAMINE CHONDR COMPLEX PO) Take 1 tablet by mouth daily.   Yes  [provider]  Multiple Vitamins-Minerals (MULTIVITAMIN WITH MINERALS) tablet Take 1 tablet by mouth daily.   Yes [provider]  oxyCODONE-acetaminophen (PERCOCET/ROXICET) 5-325 MG tablet Take 1 tablet by mouth every 6 (six) hours as needed for severe pain. 02/08/17  Yes Curt Bears, MD  pantoprazole (PROTONIX) 20 MG tablet TAKE 1 TABLET(20 MG) BY MOUTH DAILY 06/09/17  Yes [name], NP  QUEtiapine (SEROQUEL) 25 MG tablet Take 1 tablet (25 mg total) by mouth at bedtime. 06/15/17  Yes Rainey Pines, MD  simvastatin (ZOCOR) 20 MG tablet TAKE 1 TABLET(20 MG) BY MOUTH DAILY 03/11/17  Yes Estill Dooms, MD  tamsulosin (FLOMAX) 0.4 MG CAPS capsule TAKE 1 CAPSULE(0.4 MG) BY MOUTH DAILY 06/09/17  Yes [name], NP   Allergies  Allergen Reactions  . Chantix [Varenicline] Other (See Comments)    Makes patient suicidal  . Zoloft [Sertraline Hcl] Other (See Comments)    Makes patient suicidal    FAMILY HISTORY:  family history includes Cancer in his brother, brother, and father; Heart disease in his father. SOCIAL HISTORY:  reports that he quit smoking about 11 months ago. His smoking use included Cigarettes. He has a 30.00 pack-year smoking history. He has never used smokeless tobacco. He reports that he does not drink alcohol or use drugs.  REVIEW OF SYSTEMS:   All negative; except for those that are bolded, which indicate positives.  Constitutional: weight loss, weight gain, night sweats, fevers, chills, fatigue, weakness.  HEENT: headaches, sore throat, sneezing, nasal congestion, post nasal drip, difficulty swallowing, tooth/dental problems, visual complaints, visual changes, ear aches. Neuro: difficulty with speech, weakness, numbness, ataxia. CV:  chest pain, orthopnea, PND, swelling in lower extremities, dizziness, palpitations, syncope.  Resp: cough, hemoptysis, dyspnea, wheezing. GI: heartburn, indigestion, abdominal pain, nausea, vomiting, diarrhea,  constipation, change in bowel habits, loss of appetite, hematemesis, melena, hematochezia.  GU: dysuria, change in color of urine, urgency or frequency, flank pain, hematuria. MSK: joint pain or swelling, decreased range of motion. Psych: change in mood or affect, depression, anxiety, suicidal ideations, homicidal ideations. Skin: rash, itching, bruising.  SUBJECTIVE:   VITAL SIGNS: Temp:  [97.6 F (36.4 C)] 97.6 F (36.4 C) (10/02 0359) Pulse Rate:  [77-143] 111 (10/02 0930) Resp:  [16-21] 21 (10/02 0930) BP: (101-132)/(59-95) 110/95 (10/02 0930) SpO2:  [93 %-98 %] 96 % (10/02 0930) Weight:  [71.7 kg (158 lb)] 71.7 kg (158 lb) (10/02 0401)  PHYSICAL EXAMINATION: General:  Elderly male, no distress  Neuro:  Alert, oriented, follows commands  HEENT:  Normocephalic  Cardiovascular:  Irregular, tachy, no MRG  Lungs:  Clear breath sounds, no wheeze/crackles, non-labored  Abdomen:  Soft, tender, active bowel sounds  Musculoskeletal:  -edema  Skin:  Warm, dry, intact    Recent Labs Lab 07/13/17 0405  NA 137  K 3.8  CL 102  CO2 25  BUN 17  CREATININE 1.15  GLUCOSE 98    Recent Labs Lab 07/13/17 0405  HGB 11.5*  HCT 38.0*  WBC 8.2  PLT 207   Ct Angio Chest Pe W And/or Wo Contrast  Result Date: 07/13/2017 CLINICAL DATA:  Concern for pulmonary embolism. History of small cell lung cancer. EXAM: CT ANGIOGRAPHY CHEST WITH CONTRAST TECHNIQUE: Multidetector CT imaging of the chest was performed using the standard protocol during bolus administration of intravenous contrast. Multiplanar CT image reconstructions and MIPs were obtained to evaluate the vascular anatomy. CONTRAST:  100 cc Isovue 370 intravenous contrast. COMPARISON:  CT chest dated April 26, 2017. FINDINGS: Cardiovascular: Satisfactory opacification of the pulmonary arteries to the segmental level. Again noted is chronic attenuation of the  pulmonary vessels in the left lower lobe, with probable new filling defects in  the medial and posterior basal segmental and subsegmental arteries, for example series 7, image 183. Increased RV/LV ratio, measuring 1.0 Normal heart size. Trace pericardial effusion. Coronary, aortic arch, and branch vessel atherosclerotic vascular disease. Right internal jugular Port-A-Cath is unchanged. Mediastinum/Nodes: No enlarged mediastinal, hilar, or axillary lymph nodes. A prominent subcarinal lymph node measuring up to 9 mm in short axis is unchanged. Thyroid gland, trachea, and esophagus demonstrate no significant findings. Lungs/Pleura: Small left pleural effusion, decreased in size when compared to prior study. The known left infrahilar mass appears slightly smaller when compared to prior study, measuring approximately 3.7 x 1.4 cm, previously 4.0 x 1.5 cm. Surrounding pulmonary opacities in the left lower lobe are similar. Slightly increased bronchiectasis and opacity in the paramediastinal left upper lobe. Stable narrowing of the left lower lobe bronchus with unchanged peripheral left lower lobe bronchiectasis. Moderate to severe centrilobular emphysema is similar to prior study. No pneumothorax. Upper Abdomen: No acute abnormality. Musculoskeletal: No chest wall abnormality. No acute or significant osseous findings. Stable degenerative changes of the thoracic spine. Review of the MIP images confirms the above findings. IMPRESSION: 1. Chronically attenuated pulmonary vessels in the left lower lobe, with probable new filling defects in the medial and posterior basal segmental and subsegmental arteries, suspicious for pulmonary emboli. There is CT evidence of right heart strain (RV/LV Ratio = 1.0) consistent with at least submassive (intermediate risk) PE. The presence of right heart strain has been associated with an increased risk of morbidity and mortality. Please activate Code PE by paging [phone]. 2. Slight interval decrease in size of left infrahilar mass. Adjacent left lower lobe pulmonary  opacities are also similar, likely representing postobstructive and radiation pneumonitis. 3. Slightly increased bronchiectasis and pulmonary opacities in the paramediastinal left upper lobe, likely reflecting radiation pneumonitis. 4.  Aortic atherosclerosis (ICD10-I70.0). 5.  Emphysema (ICD10-J43.9). These results were called by telephone at the time of interpretation on 07/13/2017 at 9:13 am to Dr. Merrily Pew, who verbally acknowledged these results. Electronically Signed   By: Titus Dubin M.D.   On: 07/13/2017 09:18    ASSESSMENT / PLAN:  Pulmonary Embolism in small cell lung CA s/p chemotherapy  Plan   -Monitor Respiratory Status, Maintain Oxygenation >92 (Currently on room air and non-labored) -ECHO and Lower Extremity Dopplers ordered  -Continue Heparin Gtt  -Troponin x 1  -No indication for EKOS at this time    Hayden Pedro, AGACNP-BC [facility]  Pgr: [phone]  PCCM Pgr: [phone]  STAFF NOTE: Linwood Dibbles, MD FACP have personally reviewed patient's available data, including medical history, events of note, physical examination and test results as part of my evaluation. I have discussed with resident/NP and other care providers such as pharmacist, RN and RRT. In addition, I personally evaluated patient and elicited key findings of: awake, NO distress on RA, jvd mild, fib rate controlled now, lungs coarse mild left base, CT I reviewed shows min pe left , trop neg, bnp neg, hemodyanmics WNL, he also had a MRI recently with no mets but remains at risk mets brain with small cell, there is NOT indication EKOS lytics and ONLY risk,the RV ratio is also barely concerning, get echo assess rv fxn, get [place], I would not be suprised if this is in situ PE from cancer association, I think this is PE, not fully convinced but with presentation fib rvr would treat with heparin, the  cancer in left hilum appears improved, EKOS would not even reach this PE  concerns, UTI likley accounts for his fiob rvr more then this concern PE, that likley is subacute also, I updated [pt and son, will follow  Lavon Paganini. Titus Mould, MD, [place] Pgr: [facility] 07/13/2017 1:42 PM

## 2017-07-13 NOTE — ED Notes (Signed)
Patient is resting comfortably with lights dimmed, bed locked and lowered with call bell in reach.

## 2017-07-13 NOTE — Consult Note (Signed)
Cardiology Consultation:   Patient ID: Ah Bott; [id number]; 09-29-1935   Admit date: 07/13/2017 Date of Consult: 07/13/2017  Primary Care Provider: [name], NP Primary Cardiologist: New Primary Electrophysiologist:  n/a   Patient Profile:   Christian Munoz is a 82 y.o. male with a hx of small cell lung CA completed chemo/XRT 03/2017 (no evidence of brain mets), HTN, HLD, depression, cath 1995-2000 w/ no PCI in [place], reflux esophagitis, OA, BPH, who is being seen today for the evaluation of atrial fib, PE  at the request of Christian Munoz.  History of Present Illness:   Christian Munoz has no awareness of the atrial fibrillation. He had been on metoprolol in the past, dose kept being reduced while he was on chemo due to low BP, now off completely. He is no longer on any BP medications.   He has not been light-headed or dizzy recently. He had some orthostatic dizziness, 5-10 episodes. Last one about 2 weeks ago or so. No chest pain.   He denies increased SOB. No change in chronic DOE. He sleeps on his stomach, denies orthopnea, PND and LE edema.   He has recently started back exercising, got 3 sessions last week and was on the treadmill today.   He came to the ER because of painful urination. The other issues were a complete surprise.   He was found to be in afib>>d-dimer was elevated>>CT was positive for PE.     Past Medical History:  Diagnosis Date  . Allergic rhinitis due to pollen   . Anginal pain ([place])    Past medical history " does not seem to be a problem at all now."  . Cancer associated pain 02/08/2017  . Coronary atherosclerosis of native coronary artery   . Depressive disorder, not elsewhere classified   . Dysuria 01/18/2017  . Encounter for antineoplastic chemotherapy 11/30/2016  . GERD (gastroesophageal reflux disease)   . Goals of care, counseling/discussion 11/30/2016  . Headache    PMH: Migraines  . Herpes genitalia   . Hypertrophy  of prostate without urinary obstruction and other lower urinary tract symptoms (LUTS)   . Lack of coordination   . Lumbago   . Nausea and vomiting 11/30/2016  . Osteoarthrosis, unspecified whether generalized or localized, unspecified site   . Other and unspecified hyperlipidemia   . Other malaise and fatigue   . Pneumonia   . Reflux esophagitis   . Skin cancer   . Sleep apnea    does not wear CPAP  . Syncope and collapse   . Urinary frequency   . Vitamin D deficiency     Past Surgical History:  Procedure Laterality Date  . CARDIAC CATHETERIZATION  2000   exact date unclear, was done in [place], [place]. No PCI  . COLONOSCOPY    . EYE SURGERY    . IR GENERIC HISTORICAL  12/04/2016   IR US GUIDE VASC ACCESS RIGHT 12/04/2016 Christian Keen, MD [facility]  . IR GENERIC HISTORICAL  12/04/2016   IR FLUORO GUIDE PORT INSERTION RIGHT 12/04/2016 Christian Keen, MD [facility]  . poly vocal cords  1985  . SKIN CANCER EXCISION  2011  . TONSILLECTOMY    . VASECTOMY    . VIDEO BRONCHOSCOPY WITH ENDOBRONCHIAL ULTRASOUND N/A 11/20/2016   Procedure: VIDEO BRONCHOSCOPY WITH ENDOBRONCHIAL ULTRASOUND;  Surgeon: Christian Gobble, MD;  Location: [facility] OR;  Service: Thoracic;  Laterality: N/A;     Inpatient Medications: Scheduled Meds: . apixaban  10  mg Oral BID   Followed by  . [START ON 07/20/2017] apixaban  5 mg Oral BID  . aspirin EC  81 mg Oral Daily  . carvedilol  3.125 mg Oral BID WC  . escitalopram  5 mg Oral Daily  . gabapentin  300 mg Oral TID  . pantoprazole  20 mg Oral Daily  . QUEtiapine  25 mg Oral QHS  . simvastatin  20 mg Oral q1800  . sodium chloride flush  3 mL Intravenous Q12H  . tamsulosin  0.4 mg Oral Daily   Continuous Infusions: . sodium chloride     PRN Meds:  Prior to Admission medications   Medication Sig Start Date End Date Taking? Authorizing Provider  aspirin 81 MG tablet Take 81 mg by mouth daily. Take one tablet once a day   Yes [provider]    escitalopram (LEXAPRO) 5 MG tablet TAKE 1 TABLET(5 MG) BY MOUTH EVERY MORNING 06/15/17  Yes Christian Pines, MD  fish oil-omega-3 fatty acids 1000 MG capsule Take 2 g by mouth daily.    Yes [provider]  gabapentin (NEURONTIN) 300 MG capsule Take 1 capsule (300 mg total) by mouth 3 (three) times daily. 04/08/17  Yes Christian Chandler, NP  Glucosamine-Chondroitin (GLUCOSAMINE CHONDR COMPLEX PO) Take 1 tablet by mouth daily.   Yes [provider]  Multiple Vitamins-Minerals (MULTIVITAMIN WITH MINERALS) tablet Take 1 tablet by mouth daily.   Yes [provider]  oxyCODONE-acetaminophen (PERCOCET/ROXICET) 5-325 MG tablet Take 1 tablet by mouth every 6 (six) hours as needed for severe pain. 02/08/17  Yes Christian Bears, MD  pantoprazole (PROTONIX) 20 MG tablet TAKE 1 TABLET(20 MG) BY MOUTH DAILY 06/09/17  Yes Christian Chandler, NP  QUEtiapine (SEROQUEL) 25 MG tablet Take 1 tablet (25 mg total) by mouth at bedtime. 06/15/17  Yes Christian Pines, MD  simvastatin (ZOCOR) 20 MG tablet TAKE 1 TABLET(20 MG) BY MOUTH DAILY 03/11/17  Yes Christian Dooms, MD  tamsulosin (FLOMAX) 0.4 MG CAPS capsule TAKE 1 CAPSULE(0.4 MG) BY MOUTH DAILY 06/09/17  Yes Christian Chandler, NP    Allergies:    Allergies  Allergen Reactions  . Chantix [Varenicline] Other (See Comments)    Makes patient suicidal  . Zoloft [Sertraline Hcl] Other (See Comments)    Makes patient suicidal    Social History:   Social History   Social History  . Marital status: Divorced    Spouse name: N/A  . Number of children: N/A  . Years of education: N/A   Occupational History  . Retired    Social History Main Topics  . Smoking status: Former Smoker    Packs/day: 1.00    Years: 30.00    Types: Cigarettes    Quit date: 08/12/2016  . Smokeless tobacco: Never Used     Comment: 3 per day  . Alcohol use No  . Drug use: No  . Sexual activity: Not Currently   Other Topics Concern  . Not on file   Social History  Narrative   Lives alone in apartment in [place]. Son lives locally.     Family History:   The patient's family history includes Cancer in his brother, brother, and father; Heart disease in his father. Pt indicated that his mother is deceased. He indicated that his father is deceased. He indicated that only one of his two sisters is alive. He indicated that only one of his four brothers is alive. He indicated that both of his daughters  are alive. He indicated that his son is alive.    ROS:  Please see the history of present illness.  All other ROS reviewed and negative.      Physical Exam/Data:   Vitals:   07/13/17 1000 07/13/17 1030 07/13/17 1129 07/13/17 1130  BP: 93/78 105/76  101/83  Pulse: (!) 115 92 (!) 108 89  Resp: 15 20 15 18   Temp:      TempSrc:      SpO2: 96% 94% 98% 98%  Weight:      Height:        Intake/Output Summary (Last 24 hours) at 07/13/17 1207 Last data filed at 07/13/17 1129  Gross per 24 hour  Intake             1000 ml  Output              500 ml  Net              500 ml   Filed Weights   07/13/17 0401  Weight: 158 lb (71.7 kg)   Body mass index is 22.67 kg/m.  General:  Well nourished, well developed, in no acute distress HEENT: normal Lymph: no adenopathy Neck: no JVD seen Endocrine:  No thryomegaly Vascular: No carotid bruits; 4/4 extremity pulses 2+  Cardiac:  normal S1, S2; Rapid at times, irregular R&R; no murmur Lungs: decreased BS bases bilaterally, no wheezing, rhonchi, few rales  Abd: soft, nontender, no hepatomegaly  Ext: no edema Musculoskeletal:  No deformities, BUE and BLE strength normal and equal Skin: warm and dry  Neuro:  CNs 2-12 intact, no focal abnormalities noted Psych:  Normal affect   EKG:  The EKG was personally reviewed and demonstrates:  Atrial fib, HR 119, 1 PVC, Q waves in AVR, AVL, V1&2 Telemetry:  Telemetry was personally reviewed and demonstrates:  Atrial fib, mostly 90-110  Relevant CV Studies: Echo  ordered  Laboratory Data:  Chemistry  Recent Labs Lab 07/13/17 0405  NA 137  K 3.8  CL 102  CO2 25  GLUCOSE 98  BUN 17  CREATININE 1.15  CALCIUM 9.3  GFRNONAA 59*  GFRAA >60  ANIONGAP 10     Recent Labs Lab 07/13/17 0405  PROT 7.0  ALBUMIN 3.5  AST 37  ALT 11*  ALKPHOS 64  BILITOT 0.8   Hematology  Recent Labs Lab 07/13/17 0405  WBC 8.2  RBC 4.52  HGB 11.5*  HCT 38.0*  MCV 84.1  MCH 25.4*  MCHC 30.3  RDW 17.2*  PLT 207   Cardiac EnzymesNo results for input(s): TROPONINI in the last 168 hours. No results for input(s): TROPIPOC in the last 168 hours.   BNP    Component Value Date/Time   BNP 133.4 (H) 10/23/2016 1523    DDimer   Recent Labs Lab 07/13/17 0635  DDIMER 1.32*    Radiology/Studies:  Ct Angio Chest Pe W And/or Wo Contrast Result Date: 07/13/2017 CLINICAL DATA:  Concern for pulmonary embolism. History of small cell lung cancer. EXAM: CT ANGIOGRAPHY CHEST WITH CONTRAST TECHNIQUE: Multidetector CT imaging of the chest was performed using the standard protocol during bolus administration of intravenous contrast. Multiplanar CT image reconstructions and MIPs were obtained to evaluate the vascular anatomy. CONTRAST:  100 cc Isovue 370 intravenous contrast. COMPARISON:  CT chest dated April 26, 2017. FINDINGS: Cardiovascular: Satisfactory opacification of the pulmonary arteries to the segmental level. Again noted is chronic attenuation of the pulmonary vessels in the left lower lobe, with  probable new filling defects in the medial and posterior basal segmental and subsegmental arteries, for example series 7, image 183. Increased RV/LV ratio, measuring 1.0 Normal heart size. Trace pericardial effusion. Coronary, aortic arch, and branch vessel atherosclerotic vascular disease. Right internal jugular Port-A-Cath is unchanged. Mediastinum/Nodes: No enlarged mediastinal, hilar, or axillary lymph nodes. A prominent subcarinal lymph node measuring up to 9 mm  in short axis is unchanged. Thyroid gland, trachea, and esophagus demonstrate no significant findings. Lungs/Pleura: Small left pleural effusion, decreased in size when compared to prior study. The known left infrahilar mass appears slightly smaller when compared to prior study, measuring approximately 3.7 x 1.4 cm, previously 4.0 x 1.5 cm. Surrounding pulmonary opacities in the left lower lobe are similar. Slightly increased bronchiectasis and opacity in the paramediastinal left upper lobe. Stable narrowing of the left lower lobe bronchus with unchanged peripheral left lower lobe bronchiectasis. Moderate to severe centrilobular emphysema is similar to prior study. No pneumothorax. Upper Abdomen: No acute abnormality. Musculoskeletal: No chest wall abnormality. No acute or significant osseous findings. Stable degenerative changes of the thoracic spine. Review of the MIP images confirms the above findings. IMPRESSION: 1. Chronically attenuated pulmonary vessels in the left lower lobe, with probable new filling defects in the medial and posterior basal segmental and subsegmental arteries, suspicious for pulmonary emboli. There is CT evidence of right heart strain (RV/LV Ratio = 1.0) consistent with at least submassive (intermediate risk) PE. The presence of right heart strain has been associated with an increased risk of morbidity and mortality. Please activate Code PE by paging [phone]. 2. Slight interval decrease in size of left infrahilar mass. Adjacent left lower lobe pulmonary opacities are also similar, likely representing postobstructive and radiation pneumonitis. 3. Slightly increased bronchiectasis and pulmonary opacities in the paramediastinal left upper lobe, likely reflecting radiation pneumonitis. 4.  Aortic atherosclerosis (ICD10-I70.0). 5.  Emphysema (ICD10-J43.9). These results were called by telephone at the time of interpretation on 07/13/2017 at 9:13 am to Christian. Merrily Pew, who verbally  acknowledged these results. Electronically Signed   By: Titus Dubin M.D.   On: 07/13/2017 09:18    Assessment and Plan:   1. Atrial fib: - unknown duration, no recent VS check, no sx attributed to the afib. - rate generally ok at rest, especially in the setting of acute illness - will order prn IV Lopressor w/ parameters in case HR gets very high. - echo ordered, f/u on results. - TFTs ordered as well - BP too low to do much for rate control - ** IM had added Lopressor 25 mg bid. He got a dose just before 8 am, his BP is on the low side of normal and HR not yet controlled.  - Change to Coreg and see if he tolerates this better.  2. PE:  - heparin has been started by IM - they will defer to [place] the [facility] to use - unclear if this would be 2nd to the afib or lung CA - CHADS2VASC= 5 (HTN, PE x 2, age x 2)  3. Dysuria - reason for pt coming to ER - UA is abnormal - per IM  Otherwise, per IM Active Problems:   Hyperlipidemia   Reflux esophagitis   Benign prostatic hyperplasia with urinary obstruction   Small cell lung cancer, left ([place])   Pulmonary embolism ([place])   New onset a-fib ([place])   Acute lower UTI   Chronic pain    Signed, Rosaria Ferries, PA-C  07/13/2017 12:07 PM  Patient examined chart reviewed Discussed care with PA, patient and son. Patients primary symptom was urinary hesitancy Noted to be in afib and have PE in setting of stage 3 lung CA post chemo and radiation Still with right sided port a cath. Clinically in no distress with good sats. CT with LLL PE. ? Strain Echo pending Given PAF and PE anticoagulation indicated. No CNS mets or contraindications to anti coagulation. Will start eliquis 10 bid for 7 days and then lower dose. Echo , LE venous duplex and UE venous duplex to make sure port a cath no nidus of right sided thrombus. Had been on lopressor in past but stopped with chemo when BP was low. Rate control with low dose coreg as resting HR as high as 100-110  bpm. Exam with right subclavian tunneled line emphysema with no active wheezing no murmur no LE edema or tenderness   Jenkins Rouge

## 2017-07-13 NOTE — ED Notes (Signed)
Pt ambulatory to bathroom accompanied by RN at this time

## 2017-07-13 NOTE — ED Triage Notes (Signed)
C/o pain with urination onset Friday , states his urine has an odor to it.

## 2017-07-13 NOTE — Progress Notes (Addendum)
ANTICOAGULATION CONSULT NOTE - Initial Consult  Pharmacy Consult for heparin Indication: atrial fibrillation and pulmonary embolus  Allergies  Allergen Reactions  . Chantix [Varenicline] Other (See Comments)    Makes patient suicidal  . Zoloft [Sertraline Hcl] Other (See Comments)    Makes patient suicidal    Patient Measurements: Height: 5\' 10"  (177.8 cm) Weight: 158 lb (71.7 kg) IBW/kg (Calculated) : 73 Heparin Dosing Weight: 71.7kg  Vital Signs: Temp: 97.6 F (36.4 C) (10/02 0359) Temp Source: Oral (10/02 0359) BP: 109/86 (10/02 0752) Pulse Rate: 108 (10/02 0752)  Labs:  Recent Labs  07/13/17 0405  HGB 11.5*  HCT 38.0*  PLT 207  CREATININE 1.15    Estimated Creatinine Clearance: 52.8 mL/min (by C-G formula based on SCr of 1.15 mg/dL).   Medical History: Past Medical History:  Diagnosis Date  . Allergic rhinitis due to pollen   . Anginal pain ([place])    Past medical history " does not seem to be a problem at all now."  . Cancer associated pain 02/08/2017  . Coronary atherosclerosis of native coronary artery   . Coronary atherosclerosis of native coronary artery   . Depressive disorder, not elsewhere classified   . Dysuria 01/18/2017  . Encounter for antineoplastic chemotherapy 11/30/2016  . GERD (gastroesophageal reflux disease)   . Goals of care, counseling/discussion 11/30/2016  . Headache    PMH: Migraines  . Herpes genitalia   . Hypertrophy of prostate without urinary obstruction and other lower urinary tract symptoms (LUTS)   . Hypertrophy of prostate without urinary obstruction and other lower urinary tract symptoms (LUTS)   . Lack of coordination   . Lack of coordination   . Lumbago   . Nausea and vomiting 11/30/2016  . Osteoarthrosis, unspecified whether generalized or localized, unspecified site   . Osteoarthrosis, unspecified whether generalized or localized, unspecified site   . Other and unspecified hyperlipidemia   . Other malaise and fatigue    . Pneumonia   . Reflux esophagitis   . Skin cancer   . Sleep apnea    does not wear CPAP  . Syncope and collapse   . Urinary frequency   . Vitamin D deficiency     Medications:  Infusions:  . heparin      Assessment: 24 yom presented to the ED with abdominal pain. Found to be in afib and also with new PE with right heart strain. Baseline H/H is slightly low and platelets are WNL. He is not on anticoagulation PTA.   Goal of Therapy:  Heparin level 0.3-0.7 units/ml Monitor platelets by anticoagulation protocol: Yes   Plan:  Heparin bolus 4000 units IV x 1 Heparin gtt 1200 units/hr Check an 8 hr heparin level Daily heparin level and CBC  Salome Arnt, PharmD, BCPS Phone #: [phone] from 7am to 3:30pm All other times, call [place] x [phone] 07/13/2017 9:40 AM   Addendum:  Changing heparin to apixaban. Will start apixaban 10mg  PO BID x 7 days then 5mg  PO BID thereafter  Salome Arnt, PharmD, BCPS Phone #: [phone] from 7am to 3:30pm All other times, call [place] x [phone] 07/13/2017 12:04 PM

## 2017-07-13 NOTE — ED Provider Notes (Signed)
[facility] DEPT Provider Note   CSN: [id number] Arrival date & time: 07/13/17  0344     History   Chief Complaint Chief Complaint  Patient presents with  . Abdominal Pain    HPI Christian Munoz is a 82 y.o. male.  HPI Pt with hx of CAD, BPH who comes in with cc of abd pain and pain with urination. Pt reports that he has had pain with urination 2 days ago followed by abd pain. Pt also has blood in the urine. Pt denies any hx of UTI. Pt also denies any rectal pain or fevers, chills.   Pt also has hx of small cell CA with chemo completed June. Pt is noted to be in AF. Pt has no hx of AF. No new meds. Pt has felt dizziness in the recent past, but he has no chest pain, dib, dizziness.  Past Medical History:  Diagnosis Date  . Allergic rhinitis due to pollen   . Anginal pain ([place])    Past medical history " does not seem to be a problem at all now."  . Cancer associated pain 02/08/2017  . Coronary atherosclerosis of native coronary artery   . Coronary atherosclerosis of native coronary artery   . Depressive disorder, not elsewhere classified   . Dysuria 01/18/2017  . Encounter for antineoplastic chemotherapy 11/30/2016  . GERD (gastroesophageal reflux disease)   . Goals of care, counseling/discussion 11/30/2016  . Headache    PMH: Migraines  . Herpes genitalia   . Hypertrophy of prostate without urinary obstruction and other lower urinary tract symptoms (LUTS)   . Hypertrophy of prostate without urinary obstruction and other lower urinary tract symptoms (LUTS)   . Lack of coordination   . Lack of coordination   . Lumbago   . Nausea and vomiting 11/30/2016  . Osteoarthrosis, unspecified whether generalized or localized, unspecified site   . Osteoarthrosis, unspecified whether generalized or localized, unspecified site   . Other and unspecified hyperlipidemia   . Other malaise and fatigue   . Pneumonia   . Reflux esophagitis   . Skin cancer   . Sleep apnea    does not  wear CPAP  . Syncope and collapse   . Urinary frequency   . Vitamin D deficiency     Patient Active Problem List   Diagnosis Date Noted  . Antineoplastic chemotherapy induced anemia 03/01/2017  . Cancer associated pain 02/08/2017  . Dysuria 01/18/2017  . Fever 01/13/2017  . Port catheter in place 01/12/2017  . Small cell lung cancer, left ([place]) 11/30/2016  . Goals of care, counseling/discussion 11/30/2016  . Encounter for antineoplastic chemotherapy 11/30/2016  . History of fall 02/28/2016  . Hypertension 12/04/2014  . Benign prostatic hyperplasia with urinary obstruction 12/04/2014  . Depression 12/04/2014  . Hyperlipidemia 01/06/2013  . Reflux esophagitis   . Osteoarthrosis, unspecified whether generalized or localized, unspecified site     Past Surgical History:  Procedure Laterality Date  . CARDIAC CATHETERIZATION    . COLONOSCOPY    . EYE SURGERY    . IR GENERIC HISTORICAL  12/04/2016   IR US GUIDE VASC ACCESS RIGHT 12/04/2016 Greggory Keen, MD [facility]  . IR GENERIC HISTORICAL  12/04/2016   IR FLUORO GUIDE PORT INSERTION RIGHT 12/04/2016 Greggory Keen, MD [facility]  . poly vocal cords  1985  . SKIN CANCER EXCISION  2011  . TONSILLECTOMY    . VASECTOMY    . VIDEO BRONCHOSCOPY WITH ENDOBRONCHIAL ULTRASOUND N/A 11/20/2016   Procedure:  VIDEO BRONCHOSCOPY WITH ENDOBRONCHIAL ULTRASOUND;  Surgeon: Collene Gobble, MD;  Location: [facility] OR;  Service: Thoracic;  Laterality: N/A;       Home Medications    Prior to Admission medications   Medication Sig Start Date End Date Taking? Authorizing Provider  aspirin 81 MG tablet Take 81 mg by mouth daily. Take one tablet once a day   Yes [provider]  escitalopram (LEXAPRO) 5 MG tablet TAKE 1 TABLET(5 MG) BY MOUTH EVERY MORNING 06/15/17  Yes Rainey Pines, MD  fish oil-omega-3 fatty acids 1000 MG capsule Take 2 g by mouth daily.    Yes [provider]  gabapentin (NEURONTIN) 300 MG capsule Take 1 capsule (300  mg total) by mouth 3 (three) times daily. 04/08/17  Yes [name], NP  Glucosamine-Chondroitin (GLUCOSAMINE CHONDR COMPLEX PO) Take 1 tablet by mouth daily.   Yes [provider]  Multiple Vitamins-Minerals (MULTIVITAMIN WITH MINERALS) tablet Take 1 tablet by mouth daily.   Yes [provider]  oxyCODONE-acetaminophen (PERCOCET/ROXICET) 5-325 MG tablet Take 1 tablet by mouth every 6 (six) hours as needed for severe pain. 02/08/17  Yes Curt Bears, MD  pantoprazole (PROTONIX) 20 MG tablet TAKE 1 TABLET(20 MG) BY MOUTH DAILY 06/09/17  Yes [name], NP  QUEtiapine (SEROQUEL) 25 MG tablet Take 1 tablet (25 mg total) by mouth at bedtime. 06/15/17  Yes Rainey Pines, MD  simvastatin (ZOCOR) 20 MG tablet TAKE 1 TABLET(20 MG) BY MOUTH DAILY 03/11/17  Yes Estill Dooms, MD  tamsulosin (FLOMAX) 0.4 MG CAPS capsule TAKE 1 CAPSULE(0.4 MG) BY MOUTH DAILY 06/09/17  Yes [name], NP    Family History Family History  Problem Relation Age of Onset  . Heart disease Father   . Cancer Father   . Cancer Brother   . Cancer Brother     Social History Social History  Substance Use Topics  . Smoking status: Former Smoker    Packs/day: 1.00    Years: 30.00    Types: Cigarettes    Quit date: 08/12/2016  . Smokeless tobacco: Never Used     Comment: 3 per day  . Alcohol use No     Allergies   Chantix [varenicline] and Zoloft [sertraline hcl]   Review of Systems Review of Systems  Constitutional: Positive for activity change.  Gastrointestinal: Positive for abdominal pain.  Genitourinary: Positive for dysuria.  All other systems reviewed and are negative.    Physical Exam Updated Vital Signs BP 109/86   Pulse (!) 108   Temp 97.6 F (36.4 C) (Oral)   Resp 18   Ht 5\' 10"  (1.778 m)   Wt 71.7 kg (158 lb)   SpO2 98%   BMI 22.67 kg/m   Physical Exam  Constitutional: He is oriented to person, place, and time. He appears well-developed.  HENT:    Head: Atraumatic.  Neck: Neck supple.  Cardiovascular: Normal rate.   Irregular rhythm  Pulmonary/Chest: Effort normal.  Neurological: He is alert and oriented to person, place, and time.  Skin: Skin is warm.  Nursing note and vitals reviewed.    ED Treatments / Results  Labs (all labs ordered are listed, but only abnormal results are displayed) Labs Reviewed  COMPREHENSIVE METABOLIC PANEL - Abnormal; Notable for the following:       Result Value   ALT 11 (*)    GFR calc non Af Amer 59 (*)    All other components within normal limits  CBC -  Abnormal; Notable for the following:    Hemoglobin 11.5 (*)    HCT 38.0 (*)    MCH 25.4 (*)    RDW 17.2 (*)    All other components within normal limits  URINALYSIS, ROUTINE W REFLEX MICROSCOPIC - Abnormal; Notable for the following:    Color, Urine AMBER (*)    APPearance HAZY (*)    Protein, ur 100 (*)    Nitrite POSITIVE (*)    Leukocytes, UA MODERATE (*)    Bacteria, UA RARE (*)    Squamous Epithelial / LPF 0-5 (*)    All other components within normal limits  D-DIMER, QUANTITATIVE (NOT AT [facility]) - Abnormal; Notable for the following:    D-Dimer, Quant 1.32 (*)    All other components within normal limits  URINE CULTURE  LIPASE, BLOOD    EKG  EKG Interpretation  Date/Time:  Tuesday July 13 2017 05:53:06 EDT Ventricular Rate:  119 PR Interval:    QRS Duration: 82 QT Interval:  354 QTC Calculation: 499 R Axis:   78 Text Interpretation:  Atrial fibrillation Probable anteroseptal infarct, old new onset afib Confirmed by Varney Biles [phone]) on 07/13/2017 6:15:58 AM Also confirmed by Varney Biles [phone]), editor Hattie Perch ([phone])  on 07/13/2017 6:52:18 AM       Radiology No results found.  Procedures Procedures (including critical care time)  Medications Ordered in ED Medications  cephALEXin (KEFLEX) capsule 500 mg (500 mg Oral Given 07/13/17 0551)  metoprolol tartrate (LOPRESSOR) tablet 25 mg (25  mg Oral Given 07/13/17 0757)  sodium chloride 0.9 % bolus 1,000 mL (1,000 mLs Intravenous New Bag/Given 07/13/17 0757)  iopamidol (ISOVUE-370) 76 % injection (100 mLs  Contrast Given 07/13/17 0824)     Initial Impression / Assessment and Plan / ED Course  I have reviewed the triage vital signs and the nursing notes.  Pertinent labs & imaging results that were available during my care of the patient were reviewed by me and considered in my medical decision making (see chart for details).     Pt comes in with cc of uti like symptoms and UA is showing evidence of infection. Pt has prostate issues, but no hx of UTI. Pt is non toxic, no fevers and I doubt prostatitis at this time.  Pt is also noted to be in AF. This will be new onset AF. Dimer ordered to ascertain if PE is the cause - especially since pt went into [facility]. Cardiology consulted. Oral metoprolol and xarelto ordered.   This patients CHA2DS2-VASc Score and unadjusted Ischemic Stroke Rate (% per year) is equal to 2.2 % stroke rate/year from a score of 2  Above score calculated as 1 point each if present [CHF, HTN, DM, Vascular=MI/PAD/Aortic Plaque, Age if 65-74, or Male] Above score calculated as 2 points each if present [Age > 75, or Stroke/TIA/TE]    Final Clinical Impressions(s) / ED Diagnoses   Final diagnoses:  Lower urinary tract infectious disease  New onset atrial fibrillation [facility])    New Prescriptions New Prescriptions   No medications on file     Varney Biles, MD 07/13/17 [phone]

## 2017-07-13 NOTE — Progress Notes (Signed)
  Echocardiogram 2D Echocardiogram has been performed.  Merrie Roof F 07/13/2017, 2:35 PM

## 2017-07-13 NOTE — H&P (Signed)
History and Physical    Christian Munoz VPX:[id number] DOB: 1935/09/25 DOA: 07/13/2017  PCP: [name], NP Patient coming from: home  Chief Complaint: abd pain  HPI: Christian Munoz is a 82 y.o. male with medical history significant of small cell lung cancer with last chemotherapy treatment in June, CAD, GERD, BPH with intermittent urinary obstruction. Patient presents with 2 day history of painful urination, intermittent gross hematuria and abdominal discomfort in the suprapubic region. Denies fevers, chest pain, shortness breath, palpitations, rash, flank pain, fevers, diarrhea, melena, hematochezia. Intermittent brief dizzy episodes in the recent past. Denies any lower extremity swelling, recent travel, smoking.   ED Course: Started on heparin drip. Given Keflex for UTI. Metoprolol 25 mg 1 for rate control. 1 L normal saline bolus. CTA showing pulmonary embolus. Cardiology consult  Review of Systems: As per HPI otherwise all other systems reviewed and are negative  Ambulatory Status: Limited due to poor vision. Walks with a cane.  Past Medical History:  Diagnosis Date  . Allergic rhinitis due to pollen   . Anginal pain ([place])    Past medical history " does not seem to be a problem at all now."  . Cancer associated pain 02/08/2017  . Coronary atherosclerosis of native coronary artery   . Depressive disorder, not elsewhere classified   . Dysuria 01/18/2017  . Encounter for antineoplastic chemotherapy 11/30/2016  . GERD (gastroesophageal reflux disease)   . Goals of care, counseling/discussion 11/30/2016  . Headache    PMH: Migraines  . Herpes genitalia   . Hypertrophy of prostate without urinary obstruction and other lower urinary tract symptoms (LUTS)   . Lack of coordination   . Lumbago   . Nausea and vomiting 11/30/2016  . Osteoarthrosis, unspecified whether generalized or localized, unspecified site   . Other and unspecified hyperlipidemia   . Other malaise and  fatigue   . Pneumonia   . Reflux esophagitis   . Skin cancer   . Sleep apnea    does not wear CPAP  . Syncope and collapse   . Urinary frequency   . Vitamin D deficiency     Past Surgical History:  Procedure Laterality Date  . CARDIAC CATHETERIZATION  2000   exact date unclear, was done in [place], [place]. No PCI  . COLONOSCOPY    . EYE SURGERY    . IR GENERIC HISTORICAL  12/04/2016   IR US GUIDE VASC ACCESS RIGHT 12/04/2016 Greggory Keen, MD [facility]  . IR GENERIC HISTORICAL  12/04/2016   IR FLUORO GUIDE PORT INSERTION RIGHT 12/04/2016 Greggory Keen, MD [facility]  . poly vocal cords  1985  . SKIN CANCER EXCISION  2011  . TONSILLECTOMY    . VASECTOMY    . VIDEO BRONCHOSCOPY WITH ENDOBRONCHIAL ULTRASOUND N/A 11/20/2016   Procedure: VIDEO BRONCHOSCOPY WITH ENDOBRONCHIAL ULTRASOUND;  Surgeon: Collene Gobble, MD;  Location: [facility];  Service: Thoracic;  Laterality: N/A;    Social History   Social History  . Marital status: Divorced    Spouse name: N/A  . Number of children: N/A  . Years of education: N/A   Occupational History  . Not on file.   Social History Main Topics  . Smoking status: Former Smoker    Packs/day: 1.00    Years: 30.00    Types: Cigarettes    Quit date: 08/12/2016  . Smokeless tobacco: Never Used     Comment: 3 per day  . Alcohol use No  . Drug use: No  .  Sexual activity: Not Currently   Other Topics Concern  . Not on file   Social History Narrative  . No narrative on file    Allergies  Allergen Reactions  . Chantix [Varenicline] Other (See Comments)    Makes patient suicidal  . Zoloft [Sertraline Hcl] Other (See Comments)    Makes patient suicidal    Family History  Problem Relation Age of Onset  . Heart disease Father   . Cancer Father   . Cancer Brother   . Cancer Brother       Prior to Admission medications   Medication Sig Start Date End Date Taking? Authorizing Provider  aspirin 81 MG tablet Take 81 mg by mouth daily.  Take one tablet once a day   Yes [provider]  escitalopram (LEXAPRO) 5 MG tablet TAKE 1 TABLET(5 MG) BY MOUTH EVERY MORNING 06/15/17  Yes Rainey Pines, MD  fish oil-omega-3 fatty acids 1000 MG capsule Take 2 g by mouth daily.    Yes [provider]  gabapentin (NEURONTIN) 300 MG capsule Take 1 capsule (300 mg total) by mouth 3 (three) times daily. 04/08/17  Yes [name], NP  Glucosamine-Chondroitin (GLUCOSAMINE CHONDR COMPLEX PO) Take 1 tablet by mouth daily.   Yes [provider]  Multiple Vitamins-Minerals (MULTIVITAMIN WITH MINERALS) tablet Take 1 tablet by mouth daily.   Yes [provider]  oxyCODONE-acetaminophen (PERCOCET/ROXICET) 5-325 MG tablet Take 1 tablet by mouth every 6 (six) hours as needed for severe pain. 02/08/17  Yes Curt Bears, MD  pantoprazole (PROTONIX) 20 MG tablet TAKE 1 TABLET(20 MG) BY MOUTH DAILY 06/09/17  Yes [name], NP  QUEtiapine (SEROQUEL) 25 MG tablet Take 1 tablet (25 mg total) by mouth at bedtime. 06/15/17  Yes Rainey Pines, MD  simvastatin (ZOCOR) 20 MG tablet TAKE 1 TABLET(20 MG) BY MOUTH DAILY 03/11/17  Yes Estill Dooms, MD  tamsulosin (FLOMAX) 0.4 MG CAPS capsule TAKE 1 CAPSULE(0.4 MG) BY MOUTH DAILY 06/09/17  Yes [name], NP    Physical Exam: Vitals:   07/13/17 0800 07/13/17 0830 07/13/17 0900 07/13/17 0930  BP: 101/64 105/72 102/80 (!) 110/95  Pulse: (!) 123 (!) 105  (!) 111  Resp: (!) 21  18 (!) 21  Temp:      TempSrc:      SpO2: 93% 96%  96%  Weight:      Height:         General:  Appears calm and comfortable Eyes:  PERRL, EOMI, normal lids, iris ENT:  grossly normal hearing, lips & tongue, mmm Neck:  no LAD, masses or thyromegaly Cardiovascular:  Irregularly irregular, 2/6 systolic murmur, No LE edema.  Respiratory:  CTA bilaterally, no w/r/r. Normal respiratory effort. Abdomen:  soft, ntnd, NABS Skin:  no rash or induration seen on limited exam Musculoskeletal:   grossly normal tone BUE/BLE, good ROM, no bony abnormality Psychiatric:  grossly normal mood and affect, speech fluent and appropriate, AOx3 Neurologic:  CN 2-12 grossly intact, moves all extremities in coordinated fashion, sensation intact  Labs on Admission: I have personally reviewed following labs and imaging studies  CBC:  Recent Labs Lab 07/13/17 0405  WBC 8.2  HGB 11.5*  HCT 38.0*  MCV 84.1  PLT 735   Basic Metabolic Panel:  Recent Labs Lab 07/13/17 0405  NA 137  K 3.8  CL 102  CO2 25  GLUCOSE 98  BUN 17  CREATININE 1.15  CALCIUM 9.3   GFR: Estimated Creatinine Clearance:  52.8 mL/min (by C-G formula based on SCr of 1.15 mg/dL). Liver Function Tests:  Recent Labs Lab 07/13/17 0405  AST 37  ALT 11*  ALKPHOS 64  BILITOT 0.8  PROT 7.0  ALBUMIN 3.5    Recent Labs Lab 07/13/17 0405  LIPASE 31   No results for input(s): AMMONIA in the last 168 hours. Coagulation Profile: No results for input(s): INR, PROTIME in the last 168 hours. Cardiac Enzymes: No results for input(s): CKTOTAL, CKMB, CKMBINDEX, TROPONINI in the last 168 hours. BNP (last 3 results) No results for input(s): PROBNP in the last 8760 hours. HbA1C: No results for input(s): HGBA1C in the last 72 hours. CBG: No results for input(s): GLUCAP in the last 168 hours. Lipid Profile: No results for input(s): CHOL, HDL, LDLCALC, TRIG, CHOLHDL, LDLDIRECT in the last 72 hours. Thyroid Function Tests: No results for input(s): TSH, T4TOTAL, FREET4, T3FREE, THYROIDAB in the last 72 hours. Anemia Panel: No results for input(s): VITAMINB12, FOLATE, FERRITIN, TIBC, IRON, RETICCTPCT in the last 72 hours. Urine analysis:    Component Value Date/Time   COLORURINE AMBER (A) 07/13/2017 0410   APPEARANCEUR HAZY (A) 07/13/2017 0410   LABSPEC 1.011 07/13/2017 0410   LABSPEC 1.005 01/12/2017 1239   PHURINE 6.0 07/13/2017 0410   GLUCOSEU NEGATIVE 07/13/2017 0410   GLUCOSEU Negative 01/12/2017 1239    HGBUR NEGATIVE 07/13/2017 0410   BILIRUBINUR NEGATIVE 07/13/2017 0410   BILIRUBINUR Negative 01/12/2017 1239   KETONESUR NEGATIVE 07/13/2017 0410   PROTEINUR 100 (A) 07/13/2017 0410   UROBILINOGEN 0.2 01/12/2017 1239   NITRITE POSITIVE (A) 07/13/2017 0410   LEUKOCYTESUR MODERATE (A) 07/13/2017 0410   LEUKOCYTESUR Negative 01/12/2017 1239    Creatinine Clearance: Estimated Creatinine Clearance: 52.8 mL/min (by C-G formula based on SCr of 1.15 mg/dL).  Sepsis Labs: @LABRCNTIP (procalcitonin:4,lacticidven:4) )No results found for this or any previous visit (from the past 240 hour(s)).   Radiological Exams on Admission: Ct Angio Chest Pe W And/or Wo Contrast  Result Date: 07/13/2017 CLINICAL DATA:  Concern for pulmonary embolism. History of small cell lung cancer. EXAM: CT ANGIOGRAPHY CHEST WITH CONTRAST TECHNIQUE: Multidetector CT imaging of the chest was performed using the standard protocol during bolus administration of intravenous contrast. Multiplanar CT image reconstructions and MIPs were obtained to evaluate the vascular anatomy. CONTRAST:  100 cc Isovue 370 intravenous contrast. COMPARISON:  CT chest dated April 26, 2017. FINDINGS: Cardiovascular: Satisfactory opacification of the pulmonary arteries to the segmental level. Again noted is chronic attenuation of the pulmonary vessels in the left lower lobe, with probable new filling defects in the medial and posterior basal segmental and subsegmental arteries, for example series 7, image 183. Increased RV/LV ratio, measuring 1.0 Normal heart size. Trace pericardial effusion. Coronary, aortic arch, and branch vessel atherosclerotic vascular disease. Right internal jugular Port-A-Cath is unchanged. Mediastinum/Nodes: No enlarged mediastinal, hilar, or axillary lymph nodes. A prominent subcarinal lymph node measuring up to 9 mm in short axis is unchanged. Thyroid gland, trachea, and esophagus demonstrate no significant findings. Lungs/Pleura:  Small left pleural effusion, decreased in size when compared to prior study. The known left infrahilar mass appears slightly smaller when compared to prior study, measuring approximately 3.7 x 1.4 cm, previously 4.0 x 1.5 cm. Surrounding pulmonary opacities in the left lower lobe are similar. Slightly increased bronchiectasis and opacity in the paramediastinal left upper lobe. Stable narrowing of the left lower lobe bronchus with unchanged peripheral left lower lobe bronchiectasis. Moderate to severe centrilobular emphysema is similar to prior study. No pneumothorax.  Upper Abdomen: No acute abnormality. Musculoskeletal: No chest wall abnormality. No acute or significant osseous findings. Stable degenerative changes of the thoracic spine. Review of the MIP images confirms the above findings. IMPRESSION: 1. Chronically attenuated pulmonary vessels in the left lower lobe, with probable new filling defects in the medial and posterior basal segmental and subsegmental arteries, suspicious for pulmonary emboli. There is CT evidence of right heart strain (RV/LV Ratio = 1.0) consistent with at least submassive (intermediate risk) PE. The presence of right heart strain has been associated with an increased risk of morbidity and mortality. Please activate Code PE by paging [phone]. 2. Slight interval decrease in size of left infrahilar mass. Adjacent left lower lobe pulmonary opacities are also similar, likely representing postobstructive and radiation pneumonitis. 3. Slightly increased bronchiectasis and pulmonary opacities in the paramediastinal left upper lobe, likely reflecting radiation pneumonitis. 4.  Aortic atherosclerosis (ICD10-I70.0). 5.  Emphysema (ICD10-J43.9). These results were called by telephone at the time of interpretation on 07/13/2017 at 9:13 am to Dr. Merrily Pew, who verbally acknowledged these results. Electronically Signed   By: Titus Dubin M.D.   On: 07/13/2017 09:18    EKG: Independently  reviewed. Afib. No ACS  Assessment/Plan Active Problems:   Hyperlipidemia   Reflux esophagitis   Benign prostatic hyperplasia with urinary obstruction   Small cell lung cancer, left ([facility])   Pulmonary embolism ([facility])   New onset a-fib ([place])   Acute lower UTI   Chronic pain   New onset Afib: likely precipitated by strain from PE in the setting of age related cardiovascular changes and CAD. Rate improved after single dose of metoprolol 25 in ED. CHA2DS2-VASc = 3-4 - Echo - Heparin - treatment for PE - Decision for longterm anticoagulation will defer to cards. Anticipate NOAC - Cardiology consult pending - Lopressor IV prn for further rate control - Tele - Continue ASA, Statin  PE: provoked given cancer history. CT as above. Cardiac strain noted but likely an over read .  - LE duplex - Heparin - Echo - 3-48mo anticoagulation - see above - cycle trop   UTI: likely due to bladder outlet obstruction due to BPH - Bladder scan - f/u UCX - EDP initiated Keflex. Keflex until Cx return - Flomax  Small cell lung Ca: last Chemo in June 2018. Due for repeat scanning at cancer center in mid October.  - f/u as directed.   Chronic pain: - continue percocet, neurontin  Depression: stable - continue lexapro, seroquel  GERD: - continue ppi    DVT prophylaxis: Heparin  Code Status: full  Family Communication: son  Disposition Plan: pending eval by cardiology, assessment of cardiac function in conjunction w/ strain from pe, and improvement in abdominal pain from UTI  Consults called: Cardiology  Admission status: inpt    Waldemar Dickens MD [facility]  If 7PM-7AM, please contact night-coverage [email or web address] Password [email or web address]  07/13/2017, 11:26 AM

## 2017-07-14 ENCOUNTER — Telehealth: Payer: Self-pay | Admitting: Medical Oncology

## 2017-07-14 ENCOUNTER — Observation Stay (HOSPITAL_COMMUNITY): Payer: Medicare Other

## 2017-07-14 DIAGNOSIS — I480 Paroxysmal atrial fibrillation: Secondary | ICD-10-CM | POA: Diagnosis present

## 2017-07-14 DIAGNOSIS — Z7982 Long term (current) use of aspirin: Secondary | ICD-10-CM | POA: Diagnosis not present

## 2017-07-14 DIAGNOSIS — G894 Chronic pain syndrome: Secondary | ICD-10-CM

## 2017-07-14 DIAGNOSIS — K210 Gastro-esophageal reflux disease with esophagitis: Secondary | ICD-10-CM | POA: Diagnosis present

## 2017-07-14 DIAGNOSIS — I2699 Other pulmonary embolism without acute cor pulmonale: Principal | ICD-10-CM

## 2017-07-14 DIAGNOSIS — M7989 Other specified soft tissue disorders: Secondary | ICD-10-CM | POA: Diagnosis not present

## 2017-07-14 DIAGNOSIS — R0902 Hypoxemia: Secondary | ICD-10-CM

## 2017-07-14 DIAGNOSIS — N138 Other obstructive and reflux uropathy: Secondary | ICD-10-CM | POA: Diagnosis present

## 2017-07-14 DIAGNOSIS — M1990 Unspecified osteoarthritis, unspecified site: Secondary | ICD-10-CM | POA: Diagnosis present

## 2017-07-14 DIAGNOSIS — I2601 Septic pulmonary embolism with acute cor pulmonale: Secondary | ICD-10-CM | POA: Diagnosis not present

## 2017-07-14 DIAGNOSIS — Z809 Family history of malignant neoplasm, unspecified: Secondary | ICD-10-CM | POA: Diagnosis not present

## 2017-07-14 DIAGNOSIS — D649 Anemia, unspecified: Secondary | ICD-10-CM | POA: Diagnosis not present

## 2017-07-14 DIAGNOSIS — N390 Urinary tract infection, site not specified: Secondary | ICD-10-CM | POA: Diagnosis present

## 2017-07-14 DIAGNOSIS — Z85828 Personal history of other malignant neoplasm of skin: Secondary | ICD-10-CM | POA: Diagnosis not present

## 2017-07-14 DIAGNOSIS — E559 Vitamin D deficiency, unspecified: Secondary | ICD-10-CM | POA: Diagnosis present

## 2017-07-14 DIAGNOSIS — C3490 Malignant neoplasm of unspecified part of unspecified bronchus or lung: Secondary | ICD-10-CM

## 2017-07-14 DIAGNOSIS — F320 Major depressive disorder, single episode, mild: Secondary | ICD-10-CM | POA: Diagnosis not present

## 2017-07-14 DIAGNOSIS — N401 Enlarged prostate with lower urinary tract symptoms: Secondary | ICD-10-CM | POA: Diagnosis present

## 2017-07-14 DIAGNOSIS — I4891 Unspecified atrial fibrillation: Secondary | ICD-10-CM | POA: Diagnosis not present

## 2017-07-14 DIAGNOSIS — C3492 Malignant neoplasm of unspecified part of left bronchus or lung: Secondary | ICD-10-CM | POA: Diagnosis present

## 2017-07-14 DIAGNOSIS — G893 Neoplasm related pain (acute) (chronic): Secondary | ICD-10-CM | POA: Diagnosis present

## 2017-07-14 DIAGNOSIS — J700 Acute pulmonary manifestations due to radiation: Secondary | ICD-10-CM | POA: Diagnosis present

## 2017-07-14 DIAGNOSIS — J439 Emphysema, unspecified: Secondary | ICD-10-CM | POA: Diagnosis present

## 2017-07-14 DIAGNOSIS — I2510 Atherosclerotic heart disease of native coronary artery without angina pectoris: Secondary | ICD-10-CM | POA: Diagnosis present

## 2017-07-14 DIAGNOSIS — Z87891 Personal history of nicotine dependence: Secondary | ICD-10-CM | POA: Diagnosis not present

## 2017-07-14 DIAGNOSIS — Z86711 Personal history of pulmonary embolism: Secondary | ICD-10-CM

## 2017-07-14 DIAGNOSIS — Z8249 Family history of ischemic heart disease and other diseases of the circulatory system: Secondary | ICD-10-CM | POA: Diagnosis not present

## 2017-07-14 DIAGNOSIS — J301 Allergic rhinitis due to pollen: Secondary | ICD-10-CM | POA: Diagnosis present

## 2017-07-14 DIAGNOSIS — J9691 Respiratory failure, unspecified with hypoxia: Secondary | ICD-10-CM | POA: Diagnosis present

## 2017-07-14 DIAGNOSIS — F329 Major depressive disorder, single episode, unspecified: Secondary | ICD-10-CM | POA: Diagnosis present

## 2017-07-14 DIAGNOSIS — E785 Hyperlipidemia, unspecified: Secondary | ICD-10-CM | POA: Diagnosis present

## 2017-07-14 DIAGNOSIS — I700 Atherosclerosis of aorta: Secondary | ICD-10-CM | POA: Diagnosis present

## 2017-07-14 DIAGNOSIS — Z888 Allergy status to other drugs, medicaments and biological substances status: Secondary | ICD-10-CM | POA: Diagnosis not present

## 2017-07-14 DIAGNOSIS — Y842 Radiological procedure and radiotherapy as the cause of abnormal reaction of the patient, or of later complication, without mention of misadventure at the time of the procedure: Secondary | ICD-10-CM | POA: Diagnosis present

## 2017-07-14 LAB — BASIC METABOLIC PANEL
Anion gap: 9 (ref 5–15)
BUN: 17 mg/dL (ref 6–20)
CHLORIDE: 103 mmol/L (ref 101–111)
CO2: 28 mmol/L (ref 22–32)
Calcium: 9.1 mg/dL (ref 8.9–10.3)
Creatinine, Ser: 1.17 mg/dL (ref 0.61–1.24)
GFR calc Af Amer: >60 mL/min (ref >60)
GFR calc non Af Amer: 57 mL/min — ABNORMAL LOW (ref >60)
GLUCOSE: 110 mg/dL — AB (ref 65–99)
POTASSIUM: 3.7 mmol/L (ref 3.5–5.1)
SODIUM: 140 mmol/L (ref 135–145)

## 2017-07-14 LAB — CBC
HCT: 35.6 % — ABNORMAL LOW (ref 39.0–52.0)
HEMOGLOBIN: 10.9 g/dL — AB (ref 13.0–17.0)
MCH: 26.1 pg (ref 26.0–34.0)
MCHC: 30.6 g/dL (ref 30.0–36.0)
MCV: 85.2 fL (ref 78.0–100.0)
Platelets: 216 10*3/uL (ref 150–400)
RBC: 4.18 MIL/uL — AB (ref 4.22–5.81)
RDW: 17.6 % — ABNORMAL HIGH (ref 11.5–15.5)
WBC: 5.1 10*3/uL (ref 4.0–10.5)

## 2017-07-14 LAB — TROPONIN I: Troponin I: 0.03 ng/mL (ref <0.03)

## 2017-07-14 LAB — URINE CULTURE

## 2017-07-14 MED ORDER — DEXTROSE 5 % IV SOLN
1.0000 g | INTRAVENOUS | Status: DC
  Administered 2017-07-14: 1 g via INTRAVENOUS
  Filled 2017-07-14: qty 10

## 2017-07-14 MED ORDER — CEFIXIME 400 MG PO CAPS
400.0000 mg | ORAL_CAPSULE | Freq: Every day | ORAL | Status: DC
  Administered 2017-07-15: 400 mg via ORAL
  Filled 2017-07-14: qty 1

## 2017-07-14 NOTE — Progress Notes (Signed)
PROGRESS NOTE    Christian Munoz  GLO:[id number] DOB: Sep 15, 1935 DOA: 07/13/2017 PCP: [name], NP   Brief Narrative:  82 y.o. WM PMHx Depressive DO, Small Cell Lung Ca on chemotherapy last treatment June 2018, CAD, GERD, BPH with intermittent urinary obstruction. OSA (does not wear CPAP)   Presents with 2 day history of painful urination, intermittent gross hematuria and abdominal discomfort in the suprapubic region. Denies fevers, chest pain, shortness breath, palpitations, rash, flank pain, fevers, diarrhea, melena, hematochezia. Intermittent brief dizzy episodes in the recent past. Denies any lower extremity swelling, recent travel, smoking.    Subjective: 10/3  A/O 4, negative CP, negative SOB, negative abdominal pain, negative lower extremity pain. States the last dose of chemotherapy was administered late June early July 2018. Was due for a follow-up scan believes this month.     Assessment & Plan:   Active Problems:   Hyperlipidemia   Reflux esophagitis   Benign prostatic hyperplasia with urinary obstruction   Small cell lung cancer, left (HCC)   Pulmonary embolism (HCC)   New onset atrial fibrillation (HCC)   Lower urinary tract infectious disease   Chronic pain   New Onset A-Fib (CHA2DS2-VASc = 5) -Likely precipitated by PE in the setting of age-related cardiovascular changes and CAD -On Eliquis -Currently in NSR -Coreg 3.125 mg BID  -ASA 81 mg daily l-Schedule follow-up with Dr. Sherrie Mustache 1-2 weeks PE, UTI, New onset A. fib   PE:  -consider provoked given patient's cancer history -Doppler scan for bilateral lower extremity DVT and right upper extremity DVT negative -perform ambulatory SPO2 on 10/4 prior to discharge -Started on Eliquis.will need to remain on anticoagulation for 3-6 months   UTI -Likely bladder outlet obstruction due to BPH -Immunocompromised (on chemotherapy) -urine culture pending -Suprax 5 days -Flomax  daily  BPH -See UTI  Small Cell Lung Cancer(DX 11/30/2016) -Limited stage, Stage IIIA (T3, N2, M0) small cell lung cancer diagnosed in February 2018  -per Dr. Curt Bears note 8/29 patient to receive chemotherapy and radiotherapy. -10/3 discussed case with Dr. Julien Nordmann and he stated do not worry about the follow-up chest CT due in October will obtain as an outpatient. -schedule follow-up with Dr. Curt Bears in 1-2 weeks, small cell lung cancer   Chronic pain syndrome - continue current regimen of Percocet, Neurontin.   Depression  -Continue Lexapro, Seroquel   GERD: - continue ppi     Goals of care -10/3 PALLIATIVE CARE: Patient refusing some diagnostic and therapeutic modalities, patient with CA on chemotherapy. Change of CODE STATUS, short-term vs long-term goals of care     DVT prophylaxis: heparin--> Eliquis Code Status: full Family Communication: none Disposition Plan: discharge on 10/4 if stable   Consultants:  Cardiology PC CM    Procedures/Significant Events:  10/2 CT angiogram chest:-Chronically attenuated pulmonary vessels in the left lower lobe, with probable new filling defects in the medial and posterior basal segmental and subsegmental arteries, suspicious for pulmonary emboli. -Evidence of right heart strain (RV/LV Ratio = 1.0) consistent with at least submassive (intermediate risk) PE.  -Slight interval decrease in size of left infrahilar mass. Adjacent left lower lobe pulmonary opacities are also similar, likely representing postobstructive and radiation pneumonitis. -Slightly increased bronchiectasis and pulmonary opacities in the paramediastinal left upper lobe, likely reflecting radiation pneumonitis.- Emphysema (ICD10-J43.9). 10/2Echocardiogram:Left ventricle:  moderate LVH. -LVEF= 60%-65%.  - Right atrium: moderately dilated.-Pulmonary arteries:PA peak pressure: 31 mm Hg (S). 10/3 Doppler RUE: Negative for DVT 10/3 Doppler Bilateral Lower  Extremity: Negative DVT/SVT/Baker's cyst      I have personally reviewed and interpreted all radiology studies and my findings are as above.  VENTILATOR SETTINGS:    Cultures 10/2 urine positive< 10,000 colonies  10/2 MRSA by PCR negative 10/3 urine pending   Antimicrobials: Anti-infectives    Start     Dose/Rate Stop   07/15/17 1000  Cefixime (SUPRAX) capsule 400 mg     400 mg     07/14/17 1230  cefTRIAXone (ROCEPHIN) 1 g in dextrose 5 % 50 mL IVPB  Status:  Discontinued     1 g 100 mL/hr over 30 Minutes 07/14/17 1224   07/13/17 0545  cephALEXin (KEFLEX) capsule 500 mg     500 mg 07/13/17 0551       Devices    LINES / TUBES:      Continuous Infusions: . sodium chloride       Objective: Vitals:   07/13/17 1902 07/13/17 2300 07/14/17 0356 07/14/17 0400  BP: 92/62 (!) 114/97 102/60 102/60  Pulse: 71  70 64  Resp: 16  17 16   Temp: 97.6 F (36.4 C) 97.7 F (36.5 C) 97.7 F (36.5 C)   TempSrc: Oral Oral Oral   SpO2: 95%  93% 92%  Weight:      Height:        Intake/Output Summary (Last 24 hours) at 07/14/17 8250 Last data filed at 07/13/17 1832  Gross per 24 hour  Intake             1240 ml  Output              500 ml  Net              740 ml   Filed Weights   07/13/17 0401 07/13/17 1630  Weight: 158 lb (71.7 kg) 153 lb 7 oz (69.6 kg)    Examination:  General: A/O 4,No acute respiratory distress, extremely hard of hearing Neck:  Negative scars, masses, torticollis, lymphadenopathy, JVD Lungs: Clear to auscultation bilaterally without wheezes or crackles Cardiovascular: Regular rate and rhythm without murmur gallop or rub normal S1 and S2 Abdomen: negative abdominal pain, nondistended, positive soft, bowel sounds, no rebound, no ascites, no appreciable mass Extremities: No significant cyanosis, clubbing, or edema bilateral lower extremities Skin: Negative rashes, lesions, ulcers Psychiatric:  Negative depression, negative anxiety, negative  fatigue, negative mania  Central nervous system:  Cranial nerves II through XII intact, tongue/uvula midline, all extremities muscle strength 5/5, sensation intact throughout,  negative dysarthria, negative expressive aphasia, negative receptive aphasia.  .     Data Reviewed: Care during the described time interval was provided by me .  I have reviewed this patient's available data, including medical history, events of note, physical examination, and all test results as part of my evaluation.   CBC:  Recent Labs Lab 07/13/17 0405 07/14/17 0205  WBC 8.2 5.1  HGB 11.5* 10.9*  HCT 38.0* 35.6*  MCV 84.1 85.2  PLT 207 539   Basic Metabolic Panel:  Recent Labs Lab 07/13/17 0405 07/14/17 0205  NA 137 140  K 3.8 3.7  CL 102 103  CO2 25 28  GLUCOSE 98 110*  BUN 17 17  CREATININE 1.15 1.17  CALCIUM 9.3 9.1   GFR: Estimated Creatinine Clearance: 50.4 mL/min (by C-G formula based on SCr of 1.17 mg/dL). Liver Function Tests:  Recent Labs Lab 07/13/17 0405  AST 37  ALT 11*  ALKPHOS 64  BILITOT 0.8  PROT 7.0  ALBUMIN 3.5    Recent Labs Lab 07/13/17 0405  LIPASE 31   No results for input(s): AMMONIA in the last 168 hours. Coagulation Profile: No results for input(s): INR, PROTIME in the last 168 hours. Cardiac Enzymes:  Recent Labs Lab 07/13/17 1130 07/13/17 1752 07/13/17 2304  TROPONINI <0.03 <0.03 0.03*   BNP (last 3 results) No results for input(s): PROBNP in the last 8760 hours. HbA1C: No results for input(s): HGBA1C in the last 72 hours. CBG: No results for input(s): GLUCAP in the last 168 hours. Lipid Profile: No results for input(s): CHOL, HDL, LDLCALC, TRIG, CHOLHDL, LDLDIRECT in the last 72 hours. Thyroid Function Tests:  Recent Labs  07/13/17 1130  TSH 4.248  FREET4 1.00   Anemia Panel: No results for input(s): VITAMINB12, FOLATE, FERRITIN, TIBC, IRON, RETICCTPCT in the last 72 hours. Urine analysis:    Component Value Date/Time    COLORURINE AMBER (A) 07/13/2017 0410   APPEARANCEUR HAZY (A) 07/13/2017 0410   LABSPEC 1.011 07/13/2017 0410   LABSPEC 1.005 01/12/2017 1239   PHURINE 6.0 07/13/2017 0410   GLUCOSEU NEGATIVE 07/13/2017 0410   GLUCOSEU Negative 01/12/2017 1239   HGBUR NEGATIVE 07/13/2017 0410   BILIRUBINUR NEGATIVE 07/13/2017 0410   BILIRUBINUR Negative 01/12/2017 1239   KETONESUR NEGATIVE 07/13/2017 0410   PROTEINUR 100 (A) 07/13/2017 0410   UROBILINOGEN 0.2 01/12/2017 1239   NITRITE POSITIVE (A) 07/13/2017 0410   LEUKOCYTESUR MODERATE (A) 07/13/2017 0410   LEUKOCYTESUR Negative 01/12/2017 1239   Sepsis Labs: @LABRCNTIP (procalcitonin:4,lacticidven:4)  ) Recent Results (from the past 240 hour(s))  MRSA PCR Screening     Status: None   Collection Time: 07/13/17  5:10 PM  Result Value Ref Range Status   MRSA by PCR NEGATIVE NEGATIVE Final    Comment:        The GeneXpert MRSA Assay (FDA approved for NASAL specimens only), is one component of a comprehensive MRSA colonization surveillance program. It is not intended to diagnose MRSA infection nor to guide or monitor treatment for MRSA infections.          Radiology Studies: Ct Angio Chest Pe W And/or Wo Contrast  Result Date: 07/13/2017 CLINICAL DATA:  Concern for pulmonary embolism. History of small cell lung cancer. EXAM: CT ANGIOGRAPHY CHEST WITH CONTRAST TECHNIQUE: Multidetector CT imaging of the chest was performed using the standard protocol during bolus administration of intravenous contrast. Multiplanar CT image reconstructions and MIPs were obtained to evaluate the vascular anatomy. CONTRAST:  100 cc Isovue 370 intravenous contrast. COMPARISON:  CT chest dated April 26, 2017. FINDINGS: Cardiovascular: Satisfactory opacification of the pulmonary arteries to the segmental level. Again noted is chronic attenuation of the pulmonary vessels in the left lower lobe, with probable new filling defects in the medial and posterior basal  segmental and subsegmental arteries, for example series 7, image 183. Increased RV/LV ratio, measuring 1.0 Normal heart size. Trace pericardial effusion. Coronary, aortic arch, and branch vessel atherosclerotic vascular disease. Right internal jugular Port-A-Cath is unchanged. Mediastinum/Nodes: No enlarged mediastinal, hilar, or axillary lymph nodes. A prominent subcarinal lymph node measuring up to 9 mm in short axis is unchanged. Thyroid gland, trachea, and esophagus demonstrate no significant findings. Lungs/Pleura: Small left pleural effusion, decreased in size when compared to prior study. The known left infrahilar mass appears slightly smaller when compared to prior study, measuring approximately 3.7 x 1.4 cm, previously 4.0 x 1.5 cm. Surrounding pulmonary opacities in the left lower lobe are similar. Slightly increased bronchiectasis and opacity in the paramediastinal  left upper lobe. Stable narrowing of the left lower lobe bronchus with unchanged peripheral left lower lobe bronchiectasis. Moderate to severe centrilobular emphysema is similar to prior study. No pneumothorax. Upper Abdomen: No acute abnormality. Musculoskeletal: No chest wall abnormality. No acute or significant osseous findings. Stable degenerative changes of the thoracic spine. Review of the MIP images confirms the above findings. IMPRESSION: 1. Chronically attenuated pulmonary vessels in the left lower lobe, with probable new filling defects in the medial and posterior basal segmental and subsegmental arteries, suspicious for pulmonary emboli. There is CT evidence of right heart strain (RV/LV Ratio = 1.0) consistent with at least submassive (intermediate risk) PE. The presence of right heart strain has been associated with an increased risk of morbidity and mortality. Please activate Code PE by paging [phone]. 2. Slight interval decrease in size of left infrahilar mass. Adjacent left lower lobe pulmonary opacities are also similar,  likely representing postobstructive and radiation pneumonitis. 3. Slightly increased bronchiectasis and pulmonary opacities in the paramediastinal left upper lobe, likely reflecting radiation pneumonitis. 4.  Aortic atherosclerosis (ICD10-I70.0). 5.  Emphysema (ICD10-J43.9). These results were called by telephone at the time of interpretation on 07/13/2017 at 9:13 am to Dr. Merrily Pew, who verbally acknowledged these results. Electronically Signed   By: Titus Dubin M.D.   On: 07/13/2017 09:18        Scheduled Meds: . apixaban  10 mg Oral BID   Followed by  . [START ON 07/20/2017] apixaban  5 mg Oral BID  . aspirin EC  81 mg Oral Daily  . carvedilol  3.125 mg Oral BID WC  . escitalopram  5 mg Oral Daily  . gabapentin  300 mg Oral TID  . pantoprazole  20 mg Oral Daily  . QUEtiapine  25 mg Oral QHS  . simvastatin  20 mg Oral q1800  . sodium chloride flush  3 mL Intravenous Q12H  . tamsulosin  0.4 mg Oral Daily   Continuous Infusions: . sodium chloride       LOS: 0 days    Time spent: 40 minutes    Dariela Stoker, Geraldo Docker, MD [facility] Pager [phone]   If 7PM-7AM, please contact night-coverage [email or web address] Password [email or web address] 07/14/2017, 7:09 AM

## 2017-07-14 NOTE — Progress Notes (Signed)
**  Preliminary report by tech**  Right upper extremity venous duplex complete. There is no evidence of deep or superficial vein thrombosis involving the right upper extremity. All visualized vessels appear patent and compressible.   Bilateral lower extremity venous duplex completed. There is no evidence of deep or superficial vein thrombosis involving the right and left lower extremities. All visualized vessels appear patent and compressible. There is no evidence of Baker's cysts bilaterally.   07/14/17 12:01 PM Christian Munoz RVT

## 2017-07-14 NOTE — Progress Notes (Signed)
CRITICAL VALUE ALERT  Critical Value:  Troponin 0.03  Date & Time Notied:  07/14/17 0018  Provider Notified: Lamar Blinks, NP  Orders Received/Actions taken: no orders received

## 2017-07-14 NOTE — Telephone Encounter (Signed)
Requests appt for 1-2 weeks-has appt 10/23 already

## 2017-07-14 NOTE — Discharge Instructions (Addendum)
Information on my medicine - ELIQUIS (apixaban)  = Why was Eliquis prescribed for you? Eliquis was prescribed to treat blood clots that may have been found in your lungs (pulmonary embolism) and to reduce the risk of them occurring again.  What do You need to know about Eliquis ? The starting dose is 10 mg (two 5 mg tablets) taken TWICE daily for the FIRST SEVEN (7) DAYS, then on (enter date)  07/20/17  the dose is reduced to ONE 5 mg tablet taken TWICE daily.  Eliquis may be taken with or without food.   Try to take the dose about the same time in the morning and in the evening. If you have difficulty swallowing the tablet whole please discuss with your pharmacist how to take the medication safely.  Take Eliquis exactly as prescribed and DO NOT stop taking Eliquis without talking to the doctor who prescribed the medication.  Stopping may increase your risk of developing a new blood clot.  Refill your prescription before you run out.  After discharge, you should have regular check-up appointments with your healthcare provider that is prescribing your Eliquis.    What do you do if you miss a dose? If a dose of ELIQUIS is not taken at the scheduled time, take it as soon as possible on the same day and twice-daily administration should be resumed. The dose should not be doubled to make up for a missed dose.  Important Safety Information A possible side effect of Eliquis is bleeding. You should call your healthcare provider right away if you experience any of the following: ? Bleeding from an injury or your nose that does not stop. ? Unusual colored urine (red or dark brown) or unusual colored stools (red or black). ? Unusual bruising for unknown reasons. ? A serious fall or if you hit your head (even if there is no bleeding).  Some medicines may interact with Eliquis and might increase your risk of bleeding or clotting while on Eliquis. To help avoid this, consult your healthcare  provider or pharmacist prior to using any new prescription or non-prescription medications, including herbals, vitamins, non-steroidal anti-inflammatory drugs (NSAIDs) and supplements.  This website has more information on Eliquis (apixaban): http://www.eliquis.com/eliquis/home

## 2017-07-14 NOTE — Progress Notes (Signed)
Name: Christian Munoz MRN: [id number] DOB: [birth year]    ADMISSION DATE:  07/13/2017 CONSULTATION DATE:  07/13/2017  REFERRING MD :  Dr. Marily Memos   CHIEF COMPLAINT:  PE  HISTORY OF PRESENT ILLNESS:   82 year old male with PMH of CAD, BPH, GERD, small cell lung CA with involved mediastinal adenopathy (completed carboplatin and etoposide June)  Presented to ED on 10/2 with abdominal pain. Found to be in A.Fib. CTA with new PE with RV/LV 1.0. Started on heparin gtt. PCCM asked to consult.   SIGNIFICANT EVENTS  10/2 > Presented to ED   STUDIES:  MR Brain 06/05/17 > 1. Mild smooth right superior convexity dural thickening and enhancement is noted, but is stable since February and appears benign. Perhaps this is the sequelae of prior trauma. Attention is directed on future restaging MRI. 2.  No metastatic disease or acute intracranial abnormality. CTA Chest 10/2 > 1. Chronically attenuated pulmonary vessels in the left lower lobe, with probable new filling defects in the medial and posterior basal segmental and subsegmental arteries, suspicious for pulmonary emboli. There is CT evidence of right heart strain (RV/LV Ratio = 1.0) consistent with at least submassive (intermediate risk) PE. The presence of right heart strain has been associated with an increased risk of morbidity and mortality. Please activate Code PE by paging [phone]. 2. Slight interval decrease in size of left infrahilar mass. Adjacent left lower lobe pulmonary opacities are also similar, likely representing postobstructive and radiation pneumonitis. 3. Slightly increased bronchiectasis and pulmonary opacities in the paramediastinal left upper lobe, likely reflecting radiation pneumonitis. 4.  Aortic atherosclerosis (ICD10-I70.0). 5.  Emphysema (ICD10-J43.9).  SUBJECTIVE:  Off bipap.   VITAL SIGNS: Temp:  [97.6 F (36.4 C)-98.2 F (36.8 C)] 97.6 F (36.4 C) (10/03 1223) Pulse Rate:  [64-80] 66 (10/03  1223) Resp:  [14-24] 14 (10/03 1223) BP: (80-114)/(46-97) 104/65 (10/03 1223) SpO2:  [92 %-97 %] 97 % (10/03 1223) Weight:  [69.6 kg (153 lb 7 oz)] 69.6 kg (153 lb 7 oz) (10/02 1630)  PHYSICAL EXAMINATION: General:  Elderly male, sleeping Neuro:  Alert, oriented, follows commands  HEENT:  Normocephalic  Cardiovascular:  Irregular, tachy, no MRG  Lungs:  resps even non labored on [place]  Abdomen:  Soft, tender, active bowel sounds  Musculoskeletal:  -edema  Skin:  Warm, dry, intact    Recent Labs Lab 07/13/17 0405 07/14/17 0205  NA 137 140  K 3.8 3.7  CL 102 103  CO2 25 28  BUN 17 17  CREATININE 1.15 1.17  GLUCOSE 98 110*    Recent Labs Lab 07/13/17 0405 07/14/17 0205  HGB 11.5* 10.9*  HCT 38.0* 35.6*  WBC 8.2 5.1  PLT 207 216   Ct Angio Chest Pe W And/or Wo Contrast  Result Date: 07/13/2017 CLINICAL DATA:  Concern for pulmonary embolism. History of small cell lung cancer. EXAM: CT ANGIOGRAPHY CHEST WITH CONTRAST TECHNIQUE: Multidetector CT imaging of the chest was performed using the standard protocol during bolus administration of intravenous contrast. Multiplanar CT image reconstructions and MIPs were obtained to evaluate the vascular anatomy. CONTRAST:  100 cc Isovue 370 intravenous contrast. COMPARISON:  CT chest dated April 26, 2017. FINDINGS: Cardiovascular: Satisfactory opacification of the pulmonary arteries to the segmental level. Again noted is chronic attenuation of the pulmonary vessels in the left lower lobe, with probable new filling defects in the medial and posterior basal segmental and subsegmental arteries, for example series 7, image 183. Increased RV/LV ratio, measuring 1.0 Normal heart  size. Trace pericardial effusion. Coronary, aortic arch, and branch vessel atherosclerotic vascular disease. Right internal jugular Port-A-Cath is unchanged. Mediastinum/Nodes: No enlarged mediastinal, hilar, or axillary lymph nodes. A prominent subcarinal lymph node measuring  up to 9 mm in short axis is unchanged. Thyroid gland, trachea, and esophagus demonstrate no significant findings. Lungs/Pleura: Small left pleural effusion, decreased in size when compared to prior study. The known left infrahilar mass appears slightly smaller when compared to prior study, measuring approximately 3.7 x 1.4 cm, previously 4.0 x 1.5 cm. Surrounding pulmonary opacities in the left lower lobe are similar. Slightly increased bronchiectasis and opacity in the paramediastinal left upper lobe. Stable narrowing of the left lower lobe bronchus with unchanged peripheral left lower lobe bronchiectasis. Moderate to severe centrilobular emphysema is similar to prior study. No pneumothorax. Upper Abdomen: No acute abnormality. Musculoskeletal: No chest wall abnormality. No acute or significant osseous findings. Stable degenerative changes of the thoracic spine. Review of the MIP images confirms the above findings. IMPRESSION: 1. Chronically attenuated pulmonary vessels in the left lower lobe, with probable new filling defects in the medial and posterior basal segmental and subsegmental arteries, suspicious for pulmonary emboli. There is CT evidence of right heart strain (RV/LV Ratio = 1.0) consistent with at least submassive (intermediate risk) PE. The presence of right heart strain has been associated with an increased risk of morbidity and mortality. Please activate Code PE by paging [phone]. 2. Slight interval decrease in size of left infrahilar mass. Adjacent left lower lobe pulmonary opacities are also similar, likely representing postobstructive and radiation pneumonitis. 3. Slightly increased bronchiectasis and pulmonary opacities in the paramediastinal left upper lobe, likely reflecting radiation pneumonitis. 4.  Aortic atherosclerosis (ICD10-I70.0). 5.  Emphysema (ICD10-J43.9). These results were called by telephone at the time of interpretation on 07/13/2017 at 9:13 am to Dr. Merrily Pew, who  verbally acknowledged these results. Electronically Signed   By: Titus Dubin M.D.   On: 07/13/2017 09:18    ASSESSMENT / PLAN:  Pulmonary Embolism - provoked in setting small cell lung CA s/p chemotherapy. Likely subacute. Respiratory status and hemodynamics stable.  No evidence cor pulmonale on echo. Troponin neg.  Venous dopplers neg.  Plan   Now on eliquis  Supplemental O2 as needed - wean off as able  No indication for further intervention at this time  Cards following for AFib   PCCM singing off please call back if needed.   Nickolas Madrid, NP 07/14/2017  3:22 PM Pager: [phone] or [phone]  Attending Note:  82 year old male with PE and respiratory failure on BiPAP now off and stable with clear lungs on exam.  I reviewed CT myself, PE noted.  Discussed with PCCM-NP.  Hypoxemia:  - Ambulatory desaturation study on RA prior to discharge to see if patient needs O2 at home  - Titrate O2 for sat of 88-92%.  PE:  - Eliquis  A fib:  - Eliquis  Respiratory failure:  - D/C BiPAP  PCCM will sign off, please call back if needed.  Patient seen and examined, agree with above note.  I dictated the care and orders written for this patient under my direction.  Rush Farmer, [place]

## 2017-07-14 NOTE — Progress Notes (Signed)
Pt refusing staff to assist and remain at bedside with patient getting out of bed using urinal.  This RN attempted to educate patient re: being a high fall risk due to being legally blind and hearing impaired on eliquis and the need for safety interventions.  Pt. States "I understand that I am a fall risk, I have cancer, I am on a blood thinner and I understand I want dignity."  Pt also states "I will sign whatever I need to to be able to get up alone."  Pt also educated on need for staff to stay with patient as pt has PE.  Pt. Still refusing.

## 2017-07-14 NOTE — Progress Notes (Signed)
Progress Note  Patient Name: Christian Munoz Date of Encounter: 07/14/2017  Primary Cardiologist: Johnsie Cancel  Subjective   No complaints other than nursing issues with needing assistance to get OOB  Inpatient Medications    Scheduled Meds: . apixaban  10 mg Oral BID   Followed by  . [START ON 07/20/2017] apixaban  5 mg Oral BID  . aspirin EC  81 mg Oral Daily  . carvedilol  3.125 mg Oral BID WC  . escitalopram  5 mg Oral Daily  . gabapentin  300 mg Oral TID  . pantoprazole  20 mg Oral Daily  . QUEtiapine  25 mg Oral QHS  . simvastatin  20 mg Oral q1800  . sodium chloride flush  3 mL Intravenous Q12H  . tamsulosin  0.4 mg Oral Daily   Continuous Infusions: . sodium chloride     PRN Meds: sodium chloride, acetaminophen **OR** acetaminophen, metoprolol tartrate, ondansetron **OR** ondansetron (ZOFRAN) IV, oxyCODONE-acetaminophen, sodium chloride flush   Vital Signs    Vitals:   07/14/17 0356 07/14/17 0400 07/14/17 0722 07/14/17 0733  BP: 102/60 102/60  (!) 80/52  Pulse: 70 64 74 64  Resp: 17 16 (!) 24   Temp: 97.7 F (36.5 C)  98.2 F (36.8 C)   TempSrc: Oral  Oral   SpO2: 93% 92% 97%   Weight:      Height:        Intake/Output Summary (Last 24 hours) at 07/14/17 0837 Last data filed at 07/13/17 1832  Gross per 24 hour  Intake             1240 ml  Output              500 ml  Net              740 ml   Filed Weights   07/13/17 0401 07/13/17 1630  Weight: 158 lb (71.7 kg) 153 lb 7 oz (69.6 kg)    Telemetry    NSR converted from afib  - Personally Reviewed  ECG    afib non specific ST changes  - Personally Reviewed  Physical Exam  Hard of hearing  GEN: No acute distress.   Neck: No JVD right sided port a cath  Cardiac: RRR, no murmurs, rubs, or gallops.  Respiratory: Clear to auscultation bilaterally. GI: Soft, nontender, non-distended  MS: No edema; No deformity. Neuro:  Nonfocal  Psych: Normal affect   Labs    Chemistry Recent Labs Lab  07/13/17 0405 07/14/17 0205  NA 137 140  K 3.8 3.7  CL 102 103  CO2 25 28  GLUCOSE 98 110*  BUN 17 17  CREATININE 1.15 1.17  CALCIUM 9.3 9.1  PROT 7.0  --   ALBUMIN 3.5  --   AST 37  --   ALT 11*  --   ALKPHOS 64  --   BILITOT 0.8  --   GFRNONAA 59* 57*  GFRAA >60 >60  ANIONGAP 10 9     Hematology Recent Labs Lab 07/13/17 0405 07/14/17 0205  WBC 8.2 5.1  RBC 4.52 4.18*  HGB 11.5* 10.9*  HCT 38.0* 35.6*  MCV 84.1 85.2  MCH 25.4* 26.1  MCHC 30.3 30.6  RDW 17.2* 17.6*  PLT 207 216    Cardiac Enzymes Recent Labs Lab 07/13/17 1130 07/13/17 1752 07/13/17 2304  TROPONINI <0.03 <0.03 0.03*   No results for input(s): TROPIPOC in the last 168 hours.   BNP Recent Labs Lab 07/13/17 1130  BNP 158.1*     DDimer  Recent Labs Lab 07/13/17 0635  DDIMER 1.32*     Radiology    Ct Angio Chest Pe W And/or Wo Contrast  Result Date: 07/13/2017 CLINICAL DATA:  Concern for pulmonary embolism. History of small cell lung cancer. EXAM: CT ANGIOGRAPHY CHEST WITH CONTRAST TECHNIQUE: Multidetector CT imaging of the chest was performed using the standard protocol during bolus administration of intravenous contrast. Multiplanar CT image reconstructions and MIPs were obtained to evaluate the vascular anatomy. CONTRAST:  100 cc Isovue 370 intravenous contrast. COMPARISON:  CT chest dated April 26, 2017. FINDINGS: Cardiovascular: Satisfactory opacification of the pulmonary arteries to the segmental level. Again noted is chronic attenuation of the pulmonary vessels in the left lower lobe, with probable new filling defects in the medial and posterior basal segmental and subsegmental arteries, for example series 7, image 183. Increased RV/LV ratio, measuring 1.0 Normal heart size. Trace pericardial effusion. Coronary, aortic arch, and branch vessel atherosclerotic vascular disease. Right internal jugular Port-A-Cath is unchanged. Mediastinum/Nodes: No enlarged mediastinal, hilar, or axillary  lymph nodes. A prominent subcarinal lymph node measuring up to 9 mm in short axis is unchanged. Thyroid gland, trachea, and esophagus demonstrate no significant findings. Lungs/Pleura: Small left pleural effusion, decreased in size when compared to prior study. The known left infrahilar mass appears slightly smaller when compared to prior study, measuring approximately 3.7 x 1.4 cm, previously 4.0 x 1.5 cm. Surrounding pulmonary opacities in the left lower lobe are similar. Slightly increased bronchiectasis and opacity in the paramediastinal left upper lobe. Stable narrowing of the left lower lobe bronchus with unchanged peripheral left lower lobe bronchiectasis. Moderate to severe centrilobular emphysema is similar to prior study. No pneumothorax. Upper Abdomen: No acute abnormality. Musculoskeletal: No chest wall abnormality. No acute or significant osseous findings. Stable degenerative changes of the thoracic spine. Review of the MIP images confirms the above findings. IMPRESSION: 1. Chronically attenuated pulmonary vessels in the left lower lobe, with probable new filling defects in the medial and posterior basal segmental and subsegmental arteries, suspicious for pulmonary emboli. There is CT evidence of right heart strain (RV/LV Ratio = 1.0) consistent with at least submassive (intermediate risk) PE. The presence of right heart strain has been associated with an increased risk of morbidity and mortality. Please activate Code PE by paging [phone]. 2. Slight interval decrease in size of left infrahilar mass. Adjacent left lower lobe pulmonary opacities are also similar, likely representing postobstructive and radiation pneumonitis. 3. Slightly increased bronchiectasis and pulmonary opacities in the paramediastinal left upper lobe, likely reflecting radiation pneumonitis. 4.  Aortic atherosclerosis (ICD10-I70.0). 5.  Emphysema (ICD10-J43.9). These results were called by telephone at the time of  interpretation on 07/13/2017 at 9:13 am to Dr. Merrily Pew, who verbally acknowledged these results. Electronically Signed   By: Titus Dubin M.D.   On: 07/13/2017 09:18    Cardiac Studies   Echo EF 65% no cor pulmonale or significant valve disease reviewed  Patient Profile     82 y.o. male admitted with PE and new onset afib. Has lung cancer and urinary hesitancy Converted to NSR over night and on PE dosing Eliquis  Assessment & Plan    1) PE - no cor pulmonale on echo eliquis with cancer likely 6 months 10 bid dosing for a week then lower dose 2) PAF- converted to NSR likely secondary to PE  3) Lung cancer f/u oncology likely hypercoagulable [place] of RUE today see if port a cath is  nidus for thrombus 4) Urology bladder scan and antibiotics per primary service  Will sign off  For questions or updates, please contact [place] Please consult [email or web address] for contact info under Cardiology/STEMI.      Signed, Jenkins Rouge, MD  07/14/2017, 8:37 AM

## 2017-07-15 DIAGNOSIS — D649 Anemia, unspecified: Secondary | ICD-10-CM

## 2017-07-15 DIAGNOSIS — N390 Urinary tract infection, site not specified: Secondary | ICD-10-CM

## 2017-07-15 DIAGNOSIS — C3490 Malignant neoplasm of unspecified part of unspecified bronchus or lung: Secondary | ICD-10-CM

## 2017-07-15 LAB — BASIC METABOLIC PANEL
Anion gap: 9 (ref 5–15)
BUN: 14 mg/dL (ref 6–20)
CO2: 26 mmol/L (ref 22–32)
CREATININE: 1.09 mg/dL (ref 0.61–1.24)
Calcium: 8.6 mg/dL — ABNORMAL LOW (ref 8.9–10.3)
Chloride: 104 mmol/L (ref 101–111)
GFR calc Af Amer: >60 mL/min (ref >60)
GFR calc non Af Amer: >60 mL/min (ref >60)
Glucose, Bld: 94 mg/dL (ref 65–99)
Potassium: 3.6 mmol/L (ref 3.5–5.1)
SODIUM: 139 mmol/L (ref 135–145)

## 2017-07-15 LAB — CBC
HCT: 32.8 % — ABNORMAL LOW (ref 39.0–52.0)
Hemoglobin: 9.8 g/dL — ABNORMAL LOW (ref 13.0–17.0)
MCH: 25.4 pg — AB (ref 26.0–34.0)
MCHC: 29.9 g/dL — AB (ref 30.0–36.0)
MCV: 85 fL (ref 78.0–100.0)
PLATELETS: 177 10*3/uL (ref 150–400)
RBC: 3.86 MIL/uL — AB (ref 4.22–5.81)
RDW: 16.9 % — ABNORMAL HIGH (ref 11.5–15.5)
WBC: 5.2 10*3/uL (ref 4.0–10.5)

## 2017-07-15 LAB — MAGNESIUM: Magnesium: 1.5 mg/dL — ABNORMAL LOW (ref 1.7–2.4)

## 2017-07-15 MED ORDER — CEFIXIME 400 MG PO CAPS: 400.0000 mg | ORAL_CAPSULE | Freq: Every day | ORAL | 0 refills | Status: DC

## 2017-07-15 MED ORDER — CARVEDILOL 3.125 MG PO TABS: 3.1250 mg | ORAL_TABLET | Freq: Two times a day (BID) | ORAL | 0 refills | Status: DC

## 2017-07-15 MED ORDER — APIXABAN 5 MG PO TABS: ORAL_TABLET | ORAL | 0 refills | Status: DC

## 2017-07-15 MED ORDER — ONDANSETRON HCL 4 MG PO TABS: 4.0000 mg | ORAL_TABLET | Freq: Four times a day (QID) | ORAL | 0 refills | Status: DC | PRN

## 2017-07-15 NOTE — Progress Notes (Signed)
Patient given belongings and paperwork, IV removed, and prescriptions sent home in belonging bag. Patient discharged with son, to be taken home.

## 2017-07-15 NOTE — Discharge Summary (Signed)
Physician Discharge Summary  Christian Munoz WUJ:[id number] DOB: 12-Jun-1935 DOA: 07/13/2017  PCP: [name], NP  Admit date: 07/13/2017 Discharge date: 07/15/2017  Time spent: 35 minutes  Recommendations for Outpatient Follow-up:  New Onset A-Fib (CHA2DS2-VASc = 5) -Likely precipitated by PE in the setting of age-related cardiovascular changes and CAD -Eliquis  -currently in NSR -Coreg 3.125 mgBID  -ASA 81 mg daily l-Schedule follow-up with Dr. Sherrie Mustache 1-2 weeks PE, UTI, New onset A. fib   PE:  -considered provoked given patient's cancer history -Doppler scan for bilateral lower extremity DVT and right upper extremity DVT negative -perform ambulatory SPO2 on 10/4 prior to discharge -Started on Eliquis.will need to remain on anticoagulation for 3-6 months -SATURATION QUALIFICATIONS: (This note is used to comply with regulatory documentation for home oxygen) Patient Saturations on Room Air at Rest = 95% Patient Saturations on Room Air while Ambulating = 91% Patient Saturations on 0 Liters of oxygen while Ambulating = 89-91% Please briefly explain why patient needs home oxygen: not needed -patient does not require home O2.   UTI -Likely bladder outlet obstruction due to BPH -Immunocompromised (on chemotherapy) -Complete 5 day course antibiotics -Flomax daily   BPH -See UTI   Small Cell Lung Cancer(DX 11/30/2016) -Limited stage, Stage IIIA (T3, N2, M0) small cell lung cancer diagnosed in February 2018  -per Dr. Curt Bears note 8/29 patient to receive chemotherapy and radiotherapy. -10/3 discussed case with Dr. Julien Nordmann and he stated do not worry about the follow-up chest CT due in October will obtain as an outpatient. -schedule follow-up with Dr. Curt Bears in 1-2 weeks, small cell lung cancer   Chronic pain syndrome - continue current regimen of Percocet, Neurontin.   Depression  -Continue Lexapro, Seroquel   GERD: - continue  ppi  Anemia(baseline HgB 9.2-9.6) -Mostly multifactorial small cell lung cancer, chemotherapy, radiotherapy. -stable at baseline -Dr. Curt Bears Hematology Oncology will monitor and workup if required    Discharge Diagnoses:  Active Problems:   Hyperlipidemia   Reflux esophagitis   Benign prostatic hyperplasia with urinary obstruction   Small cell lung cancer, left (HCC)   Pulmonary embolism (HCC)   New onset atrial fibrillation ([place])   Lower urinary tract infectious disease   Chronic pain   Hypoxemia   Discharge Condition: stable  Diet recommendation: regular  Filed Weights   07/13/17 0401 07/13/17 1630  Weight: 158 lb (71.7 kg) 153 lb 7 oz (69.6 kg)    History of present illness:  82 y.o. WM PMHx Depressive DO, Small Cell Lung Ca on chemotherapy last treatment June 2018, CAD, GERD, BPH with intermittent urinary obstruction. OSA (does not wear CPAP)   Presents with 2 day history of painful urination, intermittent gross hematuria and abdominal discomfort in the suprapubic region. Denies fevers, chest pain, shortness breath, palpitations, rash, flank pain, fevers, diarrhea, melena, hematochezia. Intermittent brief dizzy episodes in the recent past. Denies any lower extremity swelling, recent travel, smoking.  During his hospitalization patient was diagnosed with new onset atrial fibrillation most likely secondary to pulmonary embolus. Pulmonary embolus is considered provoked given patient's diagnosis of small cell lung cancer currently being treated with chemotherapy. Patient will be discharged on anticoagulation for 3-6 months.   Procedures: 10/2 CT angiogram chest:-Chronically attenuated pulmonary vessels in the left lower lobe, with probable new filling defects in the medial and posterior basal segmental and subsegmental arteries, suspicious for pulmonary emboli. -Evidence of right heart strain (RV/LV Ratio = 1.0) consistent with at least submassive (intermediate  risk) PE.  -Slight interval decrease in size of left infrahilar mass. Adjacent left lower lobe pulmonary opacities are also similar, likely representing postobstructive and radiation pneumonitis. -Slightly increased bronchiectasis and pulmonary opacities in the paramediastinal left upper lobe, likely reflecting radiation pneumonitis.- Emphysema (ICD10-J43.9). 10/2Echocardiogram:Left ventricle:  moderate LVH. -LVEF= 60%-65%.  - Right atrium: moderately dilated.-Pulmonary arteries:PA peak pressure: 31 mm Hg (S). 10/3 Doppler RUE: Negative for DVT 10/3 Doppler Bilateral Lower Extremity: Negative DVT/SVT/Baker's cyst    Consultations: Cardiology PC CM   Cultures  10/2 urine positive< 10,000 colonies  10/2 MRSA by PCR negative 10/3 urine pending   AntibioticsAnti-infectives    Start     Dose/Rate Stop   07/15/17 1000  Cefixime (SUPRAX) capsule 400 mg     400 mg 07/18/17 2359   07/14/17 1230  cefTRIAXone (ROCEPHIN) 1 g in dextrose 5 % 50 mL IVPB  Status:  Discontinued     1 g 100 mL/hr over 30 Minutes 07/14/17 1224   07/13/17 0545  cephALEXin (KEFLEX) capsule 500 mg     500 mg 07/13/17 0551       Discharge Exam: Vitals:   07/14/17 1922 07/14/17 2315 07/15/17 0257 07/15/17 0748  BP: 99/66 125/70 110/60 94/66  Pulse: 78 70 66 64  Resp: (!) 29 14 15  (!) 21  Temp: (!) 97.3 F (36.3 C) 97.8 F (36.6 C) 98 F (36.7 C) 97.6 F (36.4 C)  TempSrc: Oral Oral Oral Oral  SpO2: 97% 92% 95% 96%  Weight:      Height:        General: A/O 4,No acute respiratory distress, extremely hard of hearing Neck:  Negative scars, masses, torticollis, lymphadenopathy, JVD Lungs: Clear to auscultation bilaterally without wheezes or crackles Cardiovascular: Regular rate and rhythm without murmur gallop or rub normal S1 and S2 Abdomen: negative abdominal pain, nondistended, positive soft, bowel sounds, no rebound, no ascites, no appreciable mass   Discharge Instructions  Discharge Instructions     Amb referral to AFIB Clinic    Complete by:  As directed      Allergies as of 07/15/2017      Reactions   Chantix [varenicline] Other (See Comments)   Makes patient suicidal   Zoloft [sertraline Hcl] Other (See Comments)   Makes patient suicidal      Medication List    TAKE these medications   apixaban 5 MG Tabs tablet Commonly known as:  ELIQUIS 2 tablets by mouth 2 times per day until October 8. Starting October 9 one tablet by mouth 2 times per day   aspirin 81 MG tablet Take 81 mg by mouth daily. Take one tablet once a day   carvedilol 3.125 MG tablet Commonly known as:  COREG Take 1 tablet (3.125 mg total) by mouth 2 (two) times daily with a meal.   Cefixime 400 MG Caps capsule Commonly known as:  SUPRAX Take 1 capsule (400 mg total) by mouth daily.   escitalopram 5 MG tablet Commonly known as:  LEXAPRO TAKE 1 TABLET(5 MG) BY MOUTH EVERY MORNING   fish oil-omega-3 fatty acids 1000 MG capsule Take 2 g by mouth daily.   gabapentin 300 MG capsule Commonly known as:  NEURONTIN Take 1 capsule (300 mg total) by mouth 3 (three) times daily.   GLUCOSAMINE CHONDR COMPLEX PO Take 1 tablet by mouth daily.   multivitamin with minerals tablet Take 1 tablet by mouth daily.   ondansetron 4 MG tablet Commonly known as:  ZOFRAN Take 1 tablet (4  mg total) by mouth every 6 (six) hours as needed for nausea.   oxyCODONE-acetaminophen 5-325 MG tablet Commonly known as:  PERCOCET/ROXICET Take 1 tablet by mouth every 6 (six) hours as needed for severe pain.   pantoprazole 20 MG tablet Commonly known as:  PROTONIX TAKE 1 TABLET(20 MG) BY MOUTH DAILY   QUEtiapine 25 MG tablet Commonly known as:  SEROQUEL Take 1 tablet (25 mg total) by mouth at bedtime.   simvastatin 20 MG tablet Commonly known as:  ZOCOR TAKE 1 TABLET(20 MG) BY MOUTH DAILY   tamsulosin 0.4 MG Caps capsule Commonly known as:  FLOMAX TAKE 1 CAPSULE(0.4 MG) BY MOUTH DAILY      Allergies  Allergen  Reactions  . Chantix [Varenicline] Other (See Comments)    Makes patient suicidal  . Zoloft [Sertraline Hcl] Other (See Comments)    Makes patient suicidal   Follow-up Information    [name], NP. Schedule an appointment as soon as possible for a visit in 2 week(s).   Specialty:  Geriatric Medicine Why:  Schedule follow-up with Dr. Sherrie Mustache 1-2 weeks PE, UTI, New onset A. fib Contact information: [address]. [place] [address] [phone]        Curt Bears, MD. Schedule an appointment as soon as possible for a visit in 2 week(s).   Specialty:  Oncology Why:  schedule follow-up with Dr. Curt Bears in 1-2 weeks, small cell lung cancer Contact information: [place] [address] [phone]            The results of significant diagnostics from this hospitalization (including imaging, microbiology, ancillary and laboratory) are listed below for reference.    Significant Diagnostic Studies: Ct Angio Chest Pe W And/or Wo Contrast  Result Date: 07/13/2017 CLINICAL DATA:  Concern for pulmonary embolism. History of small cell lung cancer. EXAM: CT ANGIOGRAPHY CHEST WITH CONTRAST TECHNIQUE: Multidetector CT imaging of the chest was performed using the standard protocol during bolus administration of intravenous contrast. Multiplanar CT image reconstructions and MIPs were obtained to evaluate the vascular anatomy. CONTRAST:  100 cc Isovue 370 intravenous contrast. COMPARISON:  CT chest dated April 26, 2017. FINDINGS: Cardiovascular: Satisfactory opacification of the pulmonary arteries to the segmental level. Again noted is chronic attenuation of the pulmonary vessels in the left lower lobe, with probable new filling defects in the medial and posterior basal segmental and subsegmental arteries, for example series 7, image 183. Increased RV/LV ratio, measuring 1.0 Normal heart size. Trace pericardial effusion. Coronary, aortic  arch, and branch vessel atherosclerotic vascular disease. Right internal jugular Port-A-Cath is unchanged. Mediastinum/Nodes: No enlarged mediastinal, hilar, or axillary lymph nodes. A prominent subcarinal lymph node measuring up to 9 mm in short axis is unchanged. Thyroid gland, trachea, and esophagus demonstrate no significant findings. Lungs/Pleura: Small left pleural effusion, decreased in size when compared to prior study. The known left infrahilar mass appears slightly smaller when compared to prior study, measuring approximately 3.7 x 1.4 cm, previously 4.0 x 1.5 cm. Surrounding pulmonary opacities in the left lower lobe are similar. Slightly increased bronchiectasis and opacity in the paramediastinal left upper lobe. Stable narrowing of the left lower lobe bronchus with unchanged peripheral left lower lobe bronchiectasis. Moderate to severe centrilobular emphysema is similar to prior study. No pneumothorax. Upper Abdomen: No acute abnormality. Musculoskeletal: No chest wall abnormality. No acute or significant osseous findings. Stable degenerative changes of the thoracic spine. Review of the MIP images confirms the above findings. IMPRESSION: 1.  Chronically attenuated pulmonary vessels in the left lower lobe, with probable new filling defects in the medial and posterior basal segmental and subsegmental arteries, suspicious for pulmonary emboli. There is CT evidence of right heart strain (RV/LV Ratio = 1.0) consistent with at least submassive (intermediate risk) PE. The presence of right heart strain has been associated with an increased risk of morbidity and mortality. Please activate Code PE by paging [phone]. 2. Slight interval decrease in size of left infrahilar mass. Adjacent left lower lobe pulmonary opacities are also similar, likely representing postobstructive and radiation pneumonitis. 3. Slightly increased bronchiectasis and pulmonary opacities in the paramediastinal left upper lobe, likely  reflecting radiation pneumonitis. 4.  Aortic atherosclerosis (ICD10-I70.0). 5.  Emphysema (ICD10-J43.9). These results were called by telephone at the time of interpretation on 07/13/2017 at 9:13 am to Dr. Merrily Pew, who verbally acknowledged these results. Electronically Signed   By: Titus Dubin M.D.   On: 07/13/2017 09:18    Microbiology: Recent Results (from the past 240 hour(s))  Urine culture     Status: Abnormal   Collection Time: 07/13/17  4:10 AM  Result Value Ref Range Status   Specimen Description URINE, RANDOM  Final   Special Requests NONE  Final   Culture <10,000 COLONIES/mL (A)  Final   Report Status 07/14/2017 FINAL  Final  MRSA PCR Screening     Status: None   Collection Time: 07/13/17  5:10 PM  Result Value Ref Range Status   MRSA by PCR NEGATIVE NEGATIVE Final    Comment:        The GeneXpert MRSA Assay (FDA approved for NASAL specimens only), is one component of a comprehensive MRSA colonization surveillance program. It is not intended to diagnose MRSA infection nor to guide or monitor treatment for MRSA infections.      Labs: Basic Metabolic Panel:  Recent Labs Lab 07/13/17 0405 07/14/17 0205 07/15/17 0503  NA 137 140 139  K 3.8 3.7 3.6  CL 102 103 104  CO2 25 28 26   GLUCOSE 98 110* 94  BUN 17 17 14   CREATININE 1.15 1.17 1.09  CALCIUM 9.3 9.1 8.6*  MG  --   --  1.5*   Liver Function Tests:  Recent Labs Lab 07/13/17 0405  AST 37  ALT 11*  ALKPHOS 64  BILITOT 0.8  PROT 7.0  ALBUMIN 3.5    Recent Labs Lab 07/13/17 0405  LIPASE 31   No results for input(s): AMMONIA in the last 168 hours. CBC:  Recent Labs Lab 07/13/17 0405 07/14/17 0205 07/15/17 0503  WBC 8.2 5.1 5.2  HGB 11.5* 10.9* 9.8*  HCT 38.0* 35.6* 32.8*  MCV 84.1 85.2 85.0  PLT 207 216 177   Cardiac Enzymes:  Recent Labs Lab 07/13/17 1130 07/13/17 1752 07/13/17 2304  TROPONINI <0.03 <0.03 0.03*   BNP: BNP (last 3 results)  Recent Labs   10/23/16 1523 07/13/17 1130  BNP 133.4* 158.1*    ProBNP (last 3 results) No results for input(s): PROBNP in the last 8760 hours.  CBG: No results for input(s): GLUCAP in the last 168 hours.     Signed:  Dia Crawford, MD [facility] [phone] pager

## 2017-07-15 NOTE — Progress Notes (Signed)
SATURATION QUALIFICATIONS: (This note is used to comply with regulatory documentation for home oxygen)  Patient Saturations on Room Air at Rest = 95%  Patient Saturations on Room Air while Ambulating = 91%  Patient Saturations on 0 Liters of oxygen while Ambulating = 89-91%  Please briefly explain why patient needs home oxygen: not needed

## 2017-07-15 NOTE — Progress Notes (Signed)
Pulse ox while ambulating stayed around 92%, occasionally down to 89%. Results reported to MD.

## 2017-07-15 NOTE — Care Management Note (Addendum)
Case Management Note  Patient Details  Name: Christian Munoz MRN: [id number] Date of Birth: 05-25-1935  Subjective/Objective:    Pt admitted with PE                Action/Plan:  PTA completely independent from home with adult son - pt states he doesn't use nor need DME.  Pt has PCP and denied barriers to obtaining prescribed medications.  Pt will discharge home on Eliquis - benefit check submitted, Pt was given free 30 day card for drug and preferred pharmacy was contacted to verify inventory for fill ([facility] on the corner of [address]    Expected Discharge Date:  07/15/17               Expected Discharge Plan:  Home/Self Care  In-House Referral:     Discharge planning Services  CM Consult  Post Acute Care Choice:    Choice offered to:     DME Arranged:    DME Agency:     HH Arranged:    [place] Agency:     Status of Service:  Completed, signed off  If discussed at [employer] of Stay Meetings, dates discussed:    Additional Comments: Benefit check yielded prior auth required - attending performed prior auth prior to pt leaving [place].  Pt has free 30 day card for medication. Maryclare Labrador, RN 07/15/2017, 12:02 PM

## 2017-07-16 ENCOUNTER — Telehealth: Payer: Self-pay

## 2017-07-16 LAB — URINE CULTURE: Culture: 30000 — AB

## 2017-07-16 NOTE — Telephone Encounter (Signed)
I have made the 1st attempt to contact the patient or family member in charge, in order to follow up from recently being discharged from the hospital. I left a message on voicemail but I will make another attempt at a different time.   I have made the 2nd attempt to contact the patient or family member in charge, in order to follow up from recently being discharged from the hospital. Patient answered and stated he just woke up and will call me later today.

## 2017-07-16 NOTE — Telephone Encounter (Signed)
Transition Care Management Follow-Up Telephone Call   Date discharged and where:[facility] on 07/15/2017  How have you been since you were released from the hospital? Feeling fine, pain free now  Any patient concerns? None  Items Reviewed:   Meds: Y  Allergies: Y  Dietary Changes Reviewed: Y  Functional Questionnaire:  Independent-I Dependent-D  ADLs:   Dressing- I    Eating-I   Maintaining continence- I   Transferring- I w/ assist   Transportation- D   Meal Prep- I   Managing Meds- I w/ assist of son  Confirmed importance and Date/Time of follow-up visits scheduled: yes, 07/21/2017 @ 2:15pm   Confirmed with patient if condition worsens to call PCP or go to the Emergency Dept. Patient was given office number and encouraged to call back with questions or concerns: Yes

## 2017-07-21 ENCOUNTER — Ambulatory Visit (INDEPENDENT_AMBULATORY_CARE_PROVIDER_SITE_OTHER): Payer: Medicare Other | Admitting: Nurse Practitioner

## 2017-07-21 ENCOUNTER — Encounter: Payer: Self-pay | Admitting: Nurse Practitioner

## 2017-07-21 VITALS — BP 118/82 | HR 75 | Temp 98.3°F | Resp 18 | Ht 70.0 in | Wt 153.8 lb

## 2017-07-21 DIAGNOSIS — I10 Essential (primary) hypertension: Secondary | ICD-10-CM

## 2017-07-21 DIAGNOSIS — F331 Major depressive disorder, recurrent, moderate: Secondary | ICD-10-CM

## 2017-07-21 DIAGNOSIS — N138 Other obstructive and reflux uropathy: Secondary | ICD-10-CM

## 2017-07-21 DIAGNOSIS — N390 Urinary tract infection, site not specified: Secondary | ICD-10-CM | POA: Diagnosis not present

## 2017-07-21 DIAGNOSIS — R300 Dysuria: Secondary | ICD-10-CM | POA: Diagnosis not present

## 2017-07-21 DIAGNOSIS — N401 Enlarged prostate with lower urinary tract symptoms: Secondary | ICD-10-CM | POA: Diagnosis not present

## 2017-07-21 DIAGNOSIS — I4891 Unspecified atrial fibrillation: Secondary | ICD-10-CM | POA: Diagnosis not present

## 2017-07-21 DIAGNOSIS — I2609 Other pulmonary embolism with acute cor pulmonale: Secondary | ICD-10-CM | POA: Diagnosis not present

## 2017-07-21 DIAGNOSIS — R8290 Unspecified abnormal findings in urine: Secondary | ICD-10-CM

## 2017-07-21 LAB — POCT URINALYSIS DIPSTICK
Bilirubin, UA: NEGATIVE
GLUCOSE UA: NEGATIVE
Ketones, UA: NEGATIVE
NITRITE UA: NEGATIVE
SPEC GRAV UA: 1.01 (ref 1.010–1.025)
UROBILINOGEN UA: 0.2 U/dL
pH, UA: 7 (ref 5.0–8.0)

## 2017-07-21 NOTE — Progress Notes (Signed)
Careteam: Patient Care Team: [name], NP as PCP - General (Nurse Practitioner) [name], OD as Referring Physician (Optometry)  Advanced Directive information Does Patient Have a Medical Advance Directive?: Yes, Type of Advance Directive: Healthcare Power of Attorney  Allergies  Allergen Reactions  . Chantix [Varenicline] Other (See Comments)    Makes patient suicidal  . Zoloft [Sertraline Hcl] Other (See Comments)    Makes patient suicidal    Chief Complaint  Patient presents with  . Transitions Of Care    Pt is being seen due to a recent hospital stay at [facility] from 07/13/17 to 07/15/17. Pt reports that he has been feeling fine since discharge.      HPI: Patient is a 82 y.o. male seen in the office today for hospital follow up for PE and afib. Pt with hx ofDepressive DO, Small Cell Lung Caon chemotherapy last treatment June 2018,CAD, GERD, BPH with intermittent urinary obstruction, OSA (does not wear CPAP). Pt went to the hospital due to painful urination and gross hematuria with abdominal discomfort. During his hospitalization patient was diagnosed with new onset atrial fibrillation most likely secondary to pulmonary embolus. Pulmonary embolus is considered provoked given patient's diagnosis of small cell lung cancer currently being treated with chemotherapy. Patient will be discharged on anticoagulation for 3-6 months.    Patient was on BiPAP while in hospital and is now on room air. States no shortness of breath since home from hospital. Ambulates without shortness of breath or claudication.   UTI - Has finished antibiotic therapy. States he is still having discomfort while voiding.  Has hx of BPH and followed by urologist, sees Dr Diona Fanti in November.   PE - Managed with Eliquis and aspirin. No shortness of breath or chest pains.   A fib - Eliquis to manage Afib.  Depression- hx of major depression disorder, seeing psychiatry for this who is prescribing  his medications. Having issue falling asleep is currently followed by psychiatry monthly.   Also noted some pain in the right thigh. Has had hx of leg pain since he was 22. This is a little worse than normal. Uses tylenol which helps. Has been increasing exercise recently due to weakness, not interested in PT.   Review of Systems:  Review of Systems  Constitutional: Negative for chills, fever, malaise/fatigue and weight loss.  HENT: Negative for congestion, hearing loss, sinus pain, sore throat and tinnitus.   Respiratory: Negative for cough, hemoptysis, sputum production, shortness of breath, wheezing and stridor.   Cardiovascular: Negative for chest pain, palpitations, orthopnea, claudication and leg swelling.  Gastrointestinal: Negative for abdominal pain, blood in stool, constipation, diarrhea, heartburn, nausea and vomiting.  Genitourinary: Positive for dysuria.  Musculoskeletal: Positive for myalgias (right leg). Negative for back pain, falls, joint pain and neck pain.  Neurological: Negative for dizziness, tremors, sensory change, speech change, focal weakness, loss of consciousness and headaches.  Psychiatric/Behavioral: Negative for hallucinations and memory loss. The patient has insomnia. The patient is not nervous/anxious.     Past Medical History:  Diagnosis Date  . Allergic rhinitis due to pollen   . Anginal pain ([place])    Past medical history " does not seem to be a problem at all now."  . Cancer associated pain 02/08/2017  . Coronary atherosclerosis of native coronary artery   . Depressive disorder, not elsewhere classified   . Dysuria 01/18/2017  . Encounter for antineoplastic chemotherapy 11/30/2016  . GERD (gastroesophageal reflux disease)   . Goals of  care, counseling/discussion 11/30/2016  . Headache    PMH: Migraines  . Herpes genitalia   . Hypertrophy of prostate without urinary obstruction and other lower urinary tract symptoms (LUTS)   . Lack of coordination   .  Lumbago   . Nausea and vomiting 11/30/2016  . Osteoarthrosis, unspecified whether generalized or localized, unspecified site   . Other and unspecified hyperlipidemia   . Other malaise and fatigue   . Pneumonia   . Reflux esophagitis   . Skin cancer   . Sleep apnea    does not wear CPAP  . Syncope and collapse   . Urinary frequency   . Vitamin D deficiency    Past Surgical History:  Procedure Laterality Date  . CARDIAC CATHETERIZATION  2000   exact date unclear, was done in [place], [place]. No PCI  . COLONOSCOPY    . EYE SURGERY    . IR GENERIC HISTORICAL  12/04/2016   IR US GUIDE VASC ACCESS RIGHT 12/04/2016 Greggory Keen, MD [facility]  . IR GENERIC HISTORICAL  12/04/2016   IR FLUORO GUIDE PORT INSERTION RIGHT 12/04/2016 Greggory Keen, MD [facility]  . poly vocal cords  1985  . SKIN CANCER EXCISION  2011  . TONSILLECTOMY    . VASECTOMY    . VIDEO BRONCHOSCOPY WITH ENDOBRONCHIAL ULTRASOUND N/A 11/20/2016   Procedure: VIDEO BRONCHOSCOPY WITH ENDOBRONCHIAL ULTRASOUND;  Surgeon: Collene Gobble, MD;  Location: [facility];  Service: Thoracic;  Laterality: N/A;   Social History:   reports that he quit smoking about 11 months ago. His smoking use included Cigarettes. He has a 30.00 pack-year smoking history. He has never used smokeless tobacco. He reports that he does not drink alcohol or use drugs.  Family History  Problem Relation Age of Onset  . Heart disease Father   . Cancer Father   . Cancer Brother   . Cancer Brother     Medications: Patient's Medications  New Prescriptions   No medications on file  Previous Medications   APIXABAN (ELIQUIS) 5 MG TABS TABLET    Take 5 mg by mouth 2 (two) times daily.   ASPIRIN 81 MG TABLET    Take 81 mg by mouth daily. Take one tablet once a day   CARVEDILOL (COREG) 3.125 MG TABLET    Take 1 tablet (3.125 mg total) by mouth 2 (two) times daily with a meal.   ESCITALOPRAM (LEXAPRO) 5 MG TABLET    TAKE 1 TABLET(5 MG) BY MOUTH EVERY MORNING    FISH OIL-OMEGA-3 FATTY ACIDS 1000 MG CAPSULE    Take 2 g by mouth daily.    GABAPENTIN (NEURONTIN) 300 MG CAPSULE    Take 1 capsule (300 mg total) by mouth 3 (three) times daily.   GLUCOSAMINE-CHONDROITIN (GLUCOSAMINE CHONDR COMPLEX PO)    Take 1 tablet by mouth daily.   MULTIPLE VITAMINS-MINERALS (MULTIVITAMIN WITH MINERALS) TABLET    Take 1 tablet by mouth daily.   ONDANSETRON (ZOFRAN) 4 MG TABLET    Take 1 tablet (4 mg total) by mouth every 6 (six) hours as needed for nausea.   OXYCODONE-ACETAMINOPHEN (PERCOCET/ROXICET) 5-325 MG TABLET    Take 1 tablet by mouth every 6 (six) hours as needed for severe pain.   PANTOPRAZOLE (PROTONIX) 20 MG TABLET    TAKE 1 TABLET(20 MG) BY MOUTH DAILY   QUETIAPINE (SEROQUEL) 25 MG TABLET    Take 1 tablet (25 mg total) by mouth at bedtime.   SIMVASTATIN (ZOCOR) 20 MG TABLET  TAKE 1 TABLET(20 MG) BY MOUTH DAILY   TAMSULOSIN (FLOMAX) 0.4 MG CAPS CAPSULE    TAKE 1 CAPSULE(0.4 MG) BY MOUTH DAILY  Modified Medications   No medications on file  Discontinued Medications   APIXABAN (ELIQUIS) 5 MG TABS TABLET    2 tablets by mouth 2 times per day until October 8. Starting October 9 one tablet by mouth 2 times per day   CEFIXIME (SUPRAX) 400 MG CAPS CAPSULE    Take 1 capsule (400 mg total) by mouth daily.     Physical Exam:  Vitals:   07/21/17 1423  BP: 118/82  Pulse: 75  Resp: 18  Temp: 98.3 F (36.8 C)  TempSrc: Oral  SpO2: 96%  Weight: 153 lb 12.8 oz (69.8 kg)  Height: 5\' 10"  (1.778 m)   Body mass index is 22.07 kg/m.  Physical Exam  Constitutional: He is oriented to person, place, and time. He appears well-developed and well-nourished. No distress.  Cardiovascular: Normal rate, normal heart sounds and intact distal pulses.   Pulmonary/Chest: Effort normal and breath sounds normal. No respiratory distress. He has no wheezes. He has no rales. He exhibits no tenderness.  Musculoskeletal: Normal range of motion. He exhibits no edema, tenderness or  deformity.       Right hip: Normal.       Right knee: Normal.       Right ankle: Normal.  Tender to anterior quadricept, strength normal, no erythema or swelling.   Neurological: He is alert and oriented to person, place, and time.  Skin: Skin is warm and dry. Capillary refill takes less than 2 seconds. He is not diaphoretic.     Bruise right anterior forearm.  Psychiatric: He has a normal mood and affect. His behavior is normal. Judgment and thought content normal.    Labs reviewed: Basic Metabolic Panel:  Recent Labs  10/22/16 1601  07/13/17 0405 07/13/17 1130 07/14/17 0205 07/15/17 0503  NA  --   < > 137  --  140 139  K  --   < > 3.8  --  3.7 3.6  CL  --   < > 102  --  103 104  CO2  --   < > 25  --  28 26  GLUCOSE  --   < > 98  --  110* 94  BUN  --   < > 17  --  17 14  CREATININE  --   < > 1.15  --  1.17 1.09  CALCIUM  --   < > 9.3  --  9.1 8.6*  MG  --   --   --   --   --  1.5*  TSH 2.20  --   --  4.248  --   --   < > = values in this interval not displayed. Liver Function Tests:  Recent Labs  03/29/17 1124 04/26/17 0747 07/13/17 0405  AST 13 17 37  ALT 10 10 11*  ALKPHOS 96 72 64  BILITOT 0.45 0.45 0.8  PROT 6.2* 6.8 7.0  ALBUMIN 3.5 3.4* 3.5    Recent Labs  07/13/17 0405  LIPASE 31   No results for input(s): AMMONIA in the last 8760 hours. CBC:  Recent Labs  03/22/17 0951 03/29/17 1124 04/26/17 0747 07/13/17 0405 07/14/17 0205 07/15/17 0503  WBC 6.9 10.6* 5.5 8.2 5.1 5.2  NEUTROABS 5.2 9.9* 3.7  --   --   --   HGB 9.4* 9.2* 10.4* 11.5* 10.9*  9.8*  HCT 29.4* 28.1* 33.6* 38.0* 35.6* 32.8*  MCV 99.7* 98.1* 100.6* 84.1 85.2 85.0  PLT 177 155 184 207 216 177   Lipid Panel:  Recent Labs  02/15/17  CHOL 114  HDL 47  LDLCALC 56  TRIG 53   TSH:  Recent Labs  10/22/16 1601 07/13/17 1130  TSH 2.20 4.248   A1C: Lab Results  Component Value Date   HGBA1C 5.7 (H) 07/13/2016     Assessment/Plan  1. Other pulmonary embolism with  acute cor pulmonale, unspecified chronicity (HCC) Thought to be provoked due to lung cancer. Lungs clear. Without symptoms at this time. Continue Eliquis 5 mg by mouth ever day.   2. New onset atrial fibrillation (HCC) -SR at this time, no signs of recurrence. Continue Eliquis for anticoagulation and on coreg.    3. Benign prostatic hyperplasia with urinary obstruction Patient followed by urology,  Continues on flomax. Seeing Dr Diona Fanti in November.   4. Lower urinary tract infectious disease Per review of hospital records culture was negative for UTI he was treated with Suprax which he has completed.  Pt with ongoing symptoms. UA collected today which was abnormal therefore sent for culture. Patient to keep appointment with urology in November.  5. Essential hypertension Controled with Carvedilol. Continue medication as ordered.  6. 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  7. Dysuria - POC Urinalysis Dipstick - Culture, Urine; Future - Culture, Urine  8. Abnormal urine finding - Culture, Urine; Future - Culture, Urine  9. Leg discomfort No pain noted to joints, good ROM and strength, appears to be muscle pain related to exercise. May use ice and muscle rub as needed. To follow up if pain worsens/fails to improve or weakness occurs   Declines follow up labs today, reports he sees Dr Julien Nordmann and will get labs at that visit.  Next appt: Return in 4 months sooner as needed  Janett Billow K. Harle Battiest  [facility] [phone] 8 am - 5 pm) [phone] (after hours)

## 2017-07-21 NOTE — Patient Instructions (Addendum)
Increase nutritional calories- more protein  Ensure daily after or between meals

## 2017-07-22 ENCOUNTER — Telehealth: Payer: Self-pay | Admitting: *Deleted

## 2017-07-22 ENCOUNTER — Other Ambulatory Visit: Payer: Self-pay | Admitting: Nurse Practitioner

## 2017-07-22 MED ORDER — AMOXICILLIN-POT CLAVULANATE 875-125 MG PO TABS: 1.0000 | ORAL_TABLET | Freq: Two times a day (BID) | ORAL | 0 refills | Status: DC

## 2017-07-22 NOTE — Telephone Encounter (Signed)
Patient called and stated that he was seen yesterday by Janett Billow. Stated that he discussed possible UTI. Patient stated that it is getting more painful to urinate. Patient is requesting a refill on his antibiotics before it gets worse. Please Advise.

## 2017-07-22 NOTE — Telephone Encounter (Signed)
Awaiting on culture but due to worsening of symptoms can call in Levaquin for 5 days (this is based on his culture from the hospital) we are doing another culture since he was treated with antibiotics and it may show different results. If they are different he will be instructed to take a different medication to cover that specific bacteria.  To use probiotic twice daily while on antibiotic

## 2017-07-22 NOTE — Telephone Encounter (Signed)
Yes; spoke with Dr Mariea Clonts, will do Augmentin twice daily for 1 week- this has been sent to his pharmacy, if fever occurs or worsening of pain while on antibiotic over the weekend to go to urgent care (culture report will most likely result over the weekend)  Also he may need to get into urology sooner if this is not helping (or can see [place] if unable to get appt with urologist)

## 2017-07-22 NOTE — Telephone Encounter (Signed)
Patient notified and agreed.  

## 2017-07-22 NOTE — Telephone Encounter (Signed)
Routed back to [facility] due to interaction with Levaquin and Seroquel.

## 2017-07-23 ENCOUNTER — Telehealth (HOSPITAL_COMMUNITY): Payer: Self-pay | Admitting: *Deleted

## 2017-07-23 NOTE — Telephone Encounter (Signed)
Left msg with both patients son and on patients vcml regarding scheduling pt for follow up.  Pt referred from ED

## 2017-07-25 LAB — URINE CULTURE
MICRO NUMBER:: 81129902
SPECIMEN QUALITY:: ADEQUATE

## 2017-07-27 ENCOUNTER — Telehealth: Payer: Self-pay | Admitting: *Deleted

## 2017-07-27 NOTE — Telephone Encounter (Signed)
Pt calling stating that his leg pain is not better, I asked pt was he using the things Janett Billow mentioned in her OV and pt stated he wasn't, he will try and call back with any questions.

## 2017-07-29 ENCOUNTER — Other Ambulatory Visit (HOSPITAL_BASED_OUTPATIENT_CLINIC_OR_DEPARTMENT_OTHER): Payer: Medicare Other

## 2017-07-29 ENCOUNTER — Ambulatory Visit (INDEPENDENT_AMBULATORY_CARE_PROVIDER_SITE_OTHER): Payer: Medicare Other | Admitting: Nurse Practitioner

## 2017-07-29 ENCOUNTER — Ambulatory Visit (HOSPITAL_BASED_OUTPATIENT_CLINIC_OR_DEPARTMENT_OTHER): Payer: Self-pay

## 2017-07-29 ENCOUNTER — Encounter: Payer: Self-pay | Admitting: Nurse Practitioner

## 2017-07-29 VITALS — BP 116/80 | HR 73 | Temp 98.3°F | Resp 17 | Ht 70.0 in | Wt 151.4 lb

## 2017-07-29 DIAGNOSIS — I2510 Atherosclerotic heart disease of native coronary artery without angina pectoris: Secondary | ICD-10-CM | POA: Diagnosis not present

## 2017-07-29 DIAGNOSIS — C3432 Malignant neoplasm of lower lobe, left bronchus or lung: Secondary | ICD-10-CM

## 2017-07-29 DIAGNOSIS — Z95828 Presence of other vascular implants and grafts: Secondary | ICD-10-CM

## 2017-07-29 DIAGNOSIS — C3492 Malignant neoplasm of unspecified part of left bronchus or lung: Secondary | ICD-10-CM

## 2017-07-29 DIAGNOSIS — M5431 Sciatica, right side: Secondary | ICD-10-CM | POA: Diagnosis not present

## 2017-07-29 LAB — CBC WITH DIFFERENTIAL/PLATELET
BASO%: 0.1 % (ref 0.0–2.0)
Basophils Absolute: 0 10*3/uL (ref 0.0–0.1)
EOS%: 0.5 % (ref 0.0–7.0)
Eosinophils Absolute: 0 10*3/uL (ref 0.0–0.5)
HCT: 32.6 % — ABNORMAL LOW (ref 38.4–49.9)
HGB: 9.9 g/dL — ABNORMAL LOW (ref 13.0–17.1)
LYMPH%: 16.4 % (ref 14.0–49.0)
MCH: 25.6 pg — ABNORMAL LOW (ref 27.2–33.4)
MCHC: 30.4 g/dL — ABNORMAL LOW (ref 32.0–36.0)
MCV: 84.2 fL (ref 79.3–98.0)
MONO#: 0.7 10*3/uL (ref 0.1–0.9)
MONO%: 9.4 % (ref 0.0–14.0)
NEUT%: 73.6 % (ref 39.0–75.0)
NEUTROS ABS: 5.7 10*3/uL (ref 1.5–6.5)
PLATELETS: 180 10*3/uL (ref 140–400)
RBC: 3.87 10*6/uL — AB (ref 4.20–5.82)
RDW: 17.8 % — ABNORMAL HIGH (ref 11.0–14.6)
WBC: 7.8 10*3/uL (ref 4.0–10.3)
lymph#: 1.3 10*3/uL (ref 0.9–3.3)

## 2017-07-29 LAB — COMPREHENSIVE METABOLIC PANEL
ALT: 16 U/L (ref 0–55)
AST: 72 U/L — AB (ref 5–34)
Albumin: 3.1 g/dL — ABNORMAL LOW (ref 3.5–5.0)
Alkaline Phosphatase: 70 U/L (ref 40–150)
Anion Gap: 10 mEq/L (ref 3–11)
BUN: 16 mg/dL (ref 7.0–26.0)
CO2: 27 meq/L (ref 22–29)
CREATININE: 1.1 mg/dL (ref 0.7–1.3)
Calcium: 9.5 mg/dL (ref 8.4–10.4)
Chloride: 101 mEq/L (ref 98–109)
EGFR: >60 mL/min/{1.73_m2} (ref >60)
GLUCOSE: 104 mg/dL (ref 70–140)
Potassium: 4.1 mEq/L (ref 3.5–5.1)
SODIUM: 138 meq/L (ref 136–145)
TOTAL PROTEIN: 7.1 g/dL (ref 6.4–8.3)
Total Bilirubin: 0.52 mg/dL (ref 0.20–1.20)

## 2017-07-29 MED ORDER — HEPARIN SOD (PORK) LOCK FLUSH 100 UNIT/ML IV SOLN
500.0000 [IU] | Freq: Once | INTRAVENOUS | Status: AC | PRN
  Administered 2017-07-29: 500 [IU] via INTRAVENOUS
  Filled 2017-07-29: qty 5

## 2017-07-29 MED ORDER — SODIUM CHLORIDE 0.9% FLUSH
10.0000 mL | INTRAVENOUS | Status: DC | PRN
  Administered 2017-07-29: 10 mL via INTRAVENOUS
  Filled 2017-07-29: qty 10

## 2017-07-29 MED ORDER — PREDNISONE 10 MG (21) PO TBPK: ORAL_TABLET | ORAL | 0 refills | Status: DC

## 2017-07-29 NOTE — Progress Notes (Signed)
Careteam: Patient Care Team: [name], NP as PCP - General (Nurse Practitioner) [name], OD as Referring Physician (Optometry)  Advanced Directive information Does Patient Have a Medical Advance Directive?: Yes, Type of Advance Directive: Living will  Allergies  Allergen Reactions  . Chantix [Varenicline] Other (See Comments)    Makes patient suicidal  . Zoloft [Sertraline Hcl] Other (See Comments)    Makes patient suicidal    Chief Complaint  Patient presents with  . Acute Visit    Pt is being seen for severe, right leg pain for a week. Pain is mostly in hip, thigh, knee, and shin. Pt states no known injury.     HPI: Patient is a 81 y.o. male seen in the office today due to right leg pain that is in his lower right lateral leg, right knee and lateral upper thigh. He states the pain started Sunday 07/25/2017. Nothing seems to help the pain. Moving makes the pain worse. He describes the pain as shooting and rates is at 8/10.  He says he has not been taking gabapentin like regular, but has not noticed significant difference in pain. Has tired oxycodone but this does not help.  Recently dx with PE, had venous US of bilateral LE without findings during hospitalization from 07/13/17-07/15/2017  Review of Systems:  Review of Systems  Constitutional: Negative for chills, diaphoresis, fever, malaise/fatigue and weight loss.  Cardiovascular: Negative for chest pain and palpitations.  Musculoskeletal: Positive for joint pain (righ knee but most of the pain going down the side of the right leg) and myalgias (Right leg).  Skin: Negative for rash.  Neurological: Negative for dizziness, tingling, weakness and headaches.  Psychiatric/Behavioral: Negative for depression and suicidal ideas.    Past Medical History:  Diagnosis Date  . Allergic rhinitis due to pollen   . Anginal pain ([place])    Past medical history " does not seem to be a problem at all now."  . Cancer  associated pain 02/08/2017  . Coronary atherosclerosis of native coronary artery   . Depressive disorder, not elsewhere classified   . Dysuria 01/18/2017  . Encounter for antineoplastic chemotherapy 11/30/2016  . GERD (gastroesophageal reflux disease)   . Goals of care, counseling/discussion 11/30/2016  . Headache    PMH: Migraines  . Herpes genitalia   . Hypertrophy of prostate without urinary obstruction and other lower urinary tract symptoms (LUTS)   . Lack of coordination   . Lumbago   . Nausea and vomiting 11/30/2016  . Osteoarthrosis, unspecified whether generalized or localized, unspecified site   . Other and unspecified hyperlipidemia   . Other malaise and fatigue   . Pneumonia   . Reflux esophagitis   . Skin cancer   . Sleep apnea    does not wear CPAP  . Syncope and collapse   . Urinary frequency   . Vitamin D deficiency    Past Surgical History:  Procedure Laterality Date  . CARDIAC CATHETERIZATION  2000   exact date unclear, was done in [place], [place]. No PCI  . COLONOSCOPY    . EYE SURGERY    . IR GENERIC HISTORICAL  12/04/2016   IR US GUIDE VASC ACCESS RIGHT 12/04/2016 Greggory Keen, MD [facility]  . IR GENERIC HISTORICAL  12/04/2016   IR FLUORO GUIDE PORT INSERTION RIGHT 12/04/2016 Greggory Keen, MD [facility]  . poly vocal cords  1985  . SKIN CANCER EXCISION  2011  . TONSILLECTOMY    . VASECTOMY    .  VIDEO BRONCHOSCOPY WITH ENDOBRONCHIAL ULTRASOUND N/A 11/20/2016   Procedure: VIDEO BRONCHOSCOPY WITH ENDOBRONCHIAL ULTRASOUND;  Surgeon: Collene Gobble, MD;  Location: [facility];  Service: Thoracic;  Laterality: N/A;   Social History:   reports that he quit smoking about a year ago. His smoking use included Cigarettes. He has a 30.00 pack-year smoking history. He has never used smokeless tobacco. He reports that he does not drink alcohol or use drugs.  Family History  Problem Relation Age of Onset  . Heart disease Father   . Cancer Father   . Cancer Brother   .  Cancer Brother     Medications: Patient's Medications  New Prescriptions   No medications on file  Previous Medications   APIXABAN (ELIQUIS) 5 MG TABS TABLET    Take 5 mg by mouth 2 (two) times daily.   ASPIRIN 81 MG TABLET    Take 81 mg by mouth daily. Take one tablet once a day   CARVEDILOL (COREG) 3.125 MG TABLET    Take 1 tablet (3.125 mg total) by mouth 2 (two) times daily with a meal.   ESCITALOPRAM (LEXAPRO) 5 MG TABLET    TAKE 1 TABLET(5 MG) BY MOUTH EVERY MORNING   FISH OIL-OMEGA-3 FATTY ACIDS 1000 MG CAPSULE    Take 2 g by mouth daily.    GABAPENTIN (NEURONTIN) 300 MG CAPSULE    Take 1 capsule (300 mg total) by mouth 3 (three) times daily.   GLUCOSAMINE-CHONDROITIN (GLUCOSAMINE CHONDR COMPLEX PO)    Take 1 tablet by mouth daily.   MULTIPLE VITAMINS-MINERALS (MULTIVITAMIN WITH MINERALS) TABLET    Take 1 tablet by mouth daily.   ONDANSETRON (ZOFRAN) 4 MG TABLET    Take 1 tablet (4 mg total) by mouth every 6 (six) hours as needed for nausea.   OXYCODONE-ACETAMINOPHEN (PERCOCET/ROXICET) 5-325 MG TABLET    Take 1 tablet by mouth every 6 (six) hours as needed for severe pain.   PANTOPRAZOLE (PROTONIX) 20 MG TABLET    TAKE 1 TABLET(20 MG) BY MOUTH DAILY   QUETIAPINE (SEROQUEL) 25 MG TABLET    Take 1 tablet (25 mg total) by mouth at bedtime.   SIMVASTATIN (ZOCOR) 20 MG TABLET    TAKE 1 TABLET(20 MG) BY MOUTH DAILY   TAMSULOSIN (FLOMAX) 0.4 MG CAPS CAPSULE    TAKE 1 CAPSULE(0.4 MG) BY MOUTH DAILY  Modified Medications   No medications on file  Discontinued Medications   AMOXICILLIN-CLAVULANATE (AUGMENTIN) 875-125 MG TABLET    Take 1 tablet by mouth 2 (two) times daily.     Physical Exam:  Vitals:   07/29/17 1523  BP: 116/80  Pulse: 73  Resp: 17  Temp: 98.3 F (36.8 C)  TempSrc: Oral  SpO2: 96%  Weight: 151 lb 6.4 oz (68.7 kg)  Height: 5\' 10"  (1.778 m)   Body mass index is 21.72 kg/m.  Physical Exam  Constitutional: He is oriented to person, place, and time. He appears  well-developed and well-nourished. No distress.  Neck: Normal range of motion. Neck supple.  Musculoskeletal: He exhibits no edema.       Right hip: He exhibits tenderness.       Legs: Areas of pain that run down right leg.  Lymphadenopathy:    He has no cervical adenopathy.  Neurological: He is alert and oriented to person, place, and time.  Skin: Skin is warm. Capillary refill takes less than 2 seconds. He is not diaphoretic.  Psychiatric: He has a normal mood and affect.  Labs reviewed: Basic Metabolic Panel:  Recent Labs  10/22/16 1601  07/13/17 0405 07/13/17 1130 07/14/17 0205 07/15/17 0503 07/29/17 0926  NA  --   < > 137  --  140 139 138  K  --   < > 3.8  --  3.7 3.6 4.1  CL  --   < > 102  --  103 104  --   CO2  --   < > 25  --  28 26 27   GLUCOSE  --   < > 98  --  110* 94 104  BUN  --   < > 17  --  17 14 16.0  CREATININE  --   < > 1.15  --  1.17 1.09 1.1  CALCIUM  --   < > 9.3  --  9.1 8.6* 9.5  MG  --   --   --   --   --  1.5*  --   TSH 2.20  --   --  4.248  --   --   --   < > = values in this interval not displayed. Liver Function Tests:  Recent Labs  04/26/17 0747 07/13/17 0405 07/29/17 0926  AST 17 37 72*  ALT 10 11* 16  ALKPHOS 72 64 70  BILITOT 0.45 0.8 0.52  PROT 6.8 7.0 7.1  ALBUMIN 3.4* 3.5 3.1*    Recent Labs  07/13/17 0405  LIPASE 31   No results for input(s): AMMONIA in the last 8760 hours. CBC:  Recent Labs  03/29/17 1124 04/26/17 0747  07/14/17 0205 07/15/17 0503 07/29/17 0926  WBC 10.6* 5.5  < > 5.1 5.2 7.8  NEUTROABS 9.9* 3.7  --   --   --  5.7  HGB 9.2* 10.4*  < > 10.9* 9.8* 9.9*  HCT 28.1* 33.6*  < > 35.6* 32.8* 32.6*  MCV 98.1* 100.6*  < > 85.2 85.0 84.2  PLT 155 184  < > 216 177 180  < > = values in this interval not displayed. Lipid Panel:  Recent Labs  02/15/17  CHOL 114  HDL 47  LDLCALC 56  TRIG 53   TSH:  Recent Labs  10/22/16 1601 07/13/17 1130  TSH 2.20 4.248   A1C: Lab Results  Component  Value Date   HGBA1C 5.7 (H) 07/13/2016     Assessment/Plan 1. Sciatica of right side Take gabapentin TID PRN pain - predniSONE (STERAPRED UNI-PAK 21 TAB) 10 MG (21) TBPK tablet; Use as directed  Dispense: 21 tablet; Refill: 0 -call back if symptoms worsen or do not improve.  Next appt: as scheduled, sooner if needed Hava Massingale K. Harle Battiest  [facility] [phone] 8 am - 5 pm) [phone] (after hours)

## 2017-07-29 NOTE — Patient Instructions (Addendum)
Will do a prednisone taper  Make sure you are taking the gabapentin three times daily    Sciatica Sciatica is pain, numbness, weakness, or tingling along the path of the sciatic nerve. The sciatic nerve starts in the lower back and runs down the back of each leg. The nerve controls the muscles in the lower leg and in the back of the knee. It also provides feeling (sensation) to the back of the thigh, the lower leg, and the sole of the foot. Sciatica is a symptom of another medical condition that pinches or puts pressure on the sciatic nerve. Generally, sciatica only affects one side of the body. Sciatica usually goes away on its own or with treatment. In some cases, sciatica may keep coming back (recur). What are the causes? This condition is caused by pressure on the sciatic nerve, or pinching of the sciatic nerve. This may be the result of:  A disk in between the bones of the spine (vertebrae) bulging out too far (herniated disk).  Age-related changes in the spinal disks (degenerative disk disease).  A pain disorder that affects a muscle in the buttock (piriformis syndrome).  Extra bone growth (bone spur) near the sciatic nerve.  An injury or break (fracture) of the pelvis.  Pregnancy.  Tumor (rare).  What increases the risk? The following factors may make you more likely to develop this condition:  Playing sports that place pressure or stress on the spine, such as football or weight lifting.  Having poor strength and flexibility.  A history of back injury.  A history of back surgery.  Sitting for long periods of time.  Doing activities that involve repetitive bending or lifting.  Obesity.  What are the signs or symptoms? Symptoms can vary from mild to very severe, and they may include:  Any of these problems in the lower back, leg, hip, or buttock: ? Mild tingling or dull aches. ? Burning sensations. ? Sharp pains.  Numbness in the back of the calf or the sole of  the foot.  Leg weakness.  Severe back pain that makes movement difficult.  These symptoms may get worse when you cough, sneeze, or laugh, or when you sit or stand for long periods of time. Being overweight may also make symptoms worse. In some cases, symptoms may recur over time. How is this diagnosed? This condition may be diagnosed based on:  Your symptoms.  A physical exam. Your health care provider may ask you to do certain movements to check whether those movements trigger your symptoms.  You may have tests, including: ? Blood tests. ? X-rays. ? MRI. ? CT scan.  How is this treated? In many cases, this condition improves on its own, without any treatment. However, treatment may include:  Reducing or modifying physical activity during periods of pain.  Exercising and stretching to strengthen your abdomen and improve the flexibility of your spine.  Icing and applying heat to the affected area.  Medicines that help: ? To relieve pain and swelling. ? To relax your muscles.  Injections of medicines that help to relieve pain, irritation, and inflammation around the sciatic nerve (steroids).  Surgery.  Follow these instructions at home: Medicines  Take over-the-counter and prescription medicines only as told by your health care provider.  Do not drive or operate heavy machinery while taking prescription pain medicine. Managing pain  If directed, apply ice to the affected area. ? Put ice in a plastic bag. ? Place a towel between your skin  and the bag. ? Leave the ice on for 20 minutes, 2-3 times a day.  After icing, apply heat to the affected area before you exercise or as often as told by your health care provider. Use the heat source that your health care provider recommends, such as a moist heat pack or a heating pad. ? Place a towel between your skin and the heat source. ? Leave the heat on for 20-30 minutes. ? Remove the heat if your skin turns bright red. This  is especially important if you are unable to feel pain, heat, or cold. You may have a greater risk of getting burned. Activity  Return to your normal activities as told by your health care provider. Ask your health care provider what activities are safe for you. ? Avoid activities that make your symptoms worse.  Take brief periods of rest throughout the day. Resting in a lying or standing position is usually better than sitting to rest. ? When you rest for longer periods, mix in some mild activity or stretching between periods of rest. This will help to prevent stiffness and pain. ? Avoid sitting for long periods of time without moving. Get up and move around at least one time each hour.  Exercise and stretch regularly, as told by your health care provider.  Do not lift anything that is heavier than 10 lb (4.5 kg) while you have symptoms of sciatica. When you do not have symptoms, you should still avoid heavy lifting, especially repetitive heavy lifting.  When you lift objects, always use proper lifting technique, which includes: ? Bending your knees. ? Keeping the load close to your body. ? Avoiding twisting. General instructions  Use good posture. ? Avoid leaning forward while sitting. ? Avoid hunching over while standing.  Maintain a healthy weight. Excess weight puts extra stress on your back and makes it difficult to maintain good posture.  Wear supportive, comfortable shoes. Avoid wearing high heels.  Avoid sleeping on a mattress that is too soft or too hard. A mattress that is firm enough to support your back when you sleep may help to reduce your pain.  Keep all follow-up visits as told by your health care provider. This is important. Contact a health care provider if:  You have pain that wakes you up when you are sleeping.  You have pain that gets worse when you lie down.  Your pain is worse than you have experienced in the past.  Your pain lasts longer than 4  weeks.  You experience unexplained weight loss. Get help right away if:  You lose control of your bowel or bladder (incontinence).  You have: ? Weakness in your lower back, pelvis, buttocks, or legs that gets worse. ? Redness or swelling of your back. ? A burning sensation when you urinate. This information is not intended to replace advice given to you by your health care provider. Make sure you discuss any questions you have with your health care provider. Document Released: 09/22/2001 Document Revised: 03/03/2016 Document Reviewed: 06/07/2015 Elsevier Interactive Patient Education  2017 [employer].

## 2017-07-30 ENCOUNTER — Telehealth: Payer: Self-pay | Admitting: *Deleted

## 2017-07-30 NOTE — Telephone Encounter (Signed)
Unsure started him on prednisone yesterday at appt, which he did not mention this at that appt.  Prednisone could be contributing to this.

## 2017-07-30 NOTE — Telephone Encounter (Signed)
Patient called and stated that he wakes up during the night with sweats, shirt soaked and he has to change it. No temperature. No other symptoms noted. Stated that this has been happening on and off for several months. Patient is just wondering where this could be coming from. Please Advise.

## 2017-07-30 NOTE — Telephone Encounter (Signed)
VM from pt's daughter Doren Custard states " my father has an appt on Tuesday with MD and he has agreed to go to  Hospice. He will downplay his symptoms with Dr. Julien Nordmann but we all feel he needs hospice. Please ask MD to discuss this subject with him on Tuesday at his visit." Unable to return call to [place] as no phone# listed for her in chart and unable to understand phone # that was left on VM. Message to MD for review.

## 2017-07-30 NOTE — Telephone Encounter (Signed)
Pt called confirmed appt on 10/23 and verbalized he would like to discuss hospice with MD. Pt gave [place] his daughter Renae's phone #, called spoke with her and discussed pt concerns will be address at next office visit

## 2017-08-02 ENCOUNTER — Ambulatory Visit: Payer: Medicare Other | Admitting: Nurse Practitioner

## 2017-08-03 ENCOUNTER — Telehealth: Payer: Self-pay | Admitting: Medical Oncology

## 2017-08-03 ENCOUNTER — Ambulatory Visit (HOSPITAL_BASED_OUTPATIENT_CLINIC_OR_DEPARTMENT_OTHER): Payer: Medicare Other | Admitting: Internal Medicine

## 2017-08-03 ENCOUNTER — Encounter: Payer: Self-pay | Admitting: Internal Medicine

## 2017-08-03 VITALS — BP 132/74 | HR 73 | Temp 98.1°F | Resp 18 | Ht 70.0 in | Wt 152.8 lb

## 2017-08-03 DIAGNOSIS — C3432 Malignant neoplasm of lower lobe, left bronchus or lung: Secondary | ICD-10-CM

## 2017-08-03 DIAGNOSIS — Z7901 Long term (current) use of anticoagulants: Secondary | ICD-10-CM

## 2017-08-03 DIAGNOSIS — I2699 Other pulmonary embolism without acute cor pulmonale: Secondary | ICD-10-CM

## 2017-08-03 DIAGNOSIS — I2692 Saddle embolus of pulmonary artery without acute cor pulmonale: Secondary | ICD-10-CM

## 2017-08-03 DIAGNOSIS — C3492 Malignant neoplasm of unspecified part of left bronchus or lung: Secondary | ICD-10-CM

## 2017-08-03 NOTE — Progress Notes (Signed)
Referred to hospice.

## 2017-08-03 NOTE — Telephone Encounter (Signed)
Call [facility] re referral. NOte to [facility] , RN

## 2017-08-03 NOTE — Progress Notes (Signed)
[facility] [phone]   Fax:([phone]  OFFICE PROGRESS NOTE  [name], NP [place] [address]  DIAGNOSIS:  1) Limited stage, Stage IIIA (T3, N2, M0) small cell lung cancer diagnosed in February 2018 and presented with left lower lobe lung mass in addition to subcarinal lymphadenopathy. 2) pulmonary embolism and involving the medial and posterior basal segment and subsegmental arteries diagnosed in October 2018  PRIOR THERAPY: Systemic chemotherapy with carboplatin for AUC of 5 on day 1 and etoposide 100 MG/M2 on days 1, 2 and 3 with Neulasta support. This will be concurrent with radiotherapy. Status post 6 cycles.  CURRENT THERAPY: Eliquis 5 mg by mouth twice a day.  INTERVAL HISTORY: Christian Munoz 82 y.o. male returns to the clinic today for routine follow-up visit accompanied by his son. The patient is feeling fine today with no specific complaints. The patient was seen in the hospital recently complaining of worsening shortness of breath and CT angiogram of the chest performed on 07/13/2017 showed no filling defects in the medial and posterior basal segmental and subsegmental arteries suspicious for pulmonary emboli. He was started on Eliquis and currently on 5 mg by mouth daily and tolerating it well. He denied having any current chest pain, shortness of breath, cough or hemoptysis. He has intermittent pain in the right lower rib cage as well as the hip and knee area. He has no nausea, vomiting, diarrhea or constipation. He is here today for evaluation and discussion of his treatment options.   MEDICAL HISTORY: Past Medical History:  Diagnosis Date  . Allergic rhinitis due to pollen   . Anginal pain ([place])    Past medical history " does not seem to be a problem at all now."  . Cancer associated pain 02/08/2017  . Coronary atherosclerosis of native coronary artery   . Depressive disorder, not elsewhere classified    . Dysuria 01/18/2017  . Encounter for antineoplastic chemotherapy 11/30/2016  . GERD (gastroesophageal reflux disease)   . Goals of care, counseling/discussion 11/30/2016  . Headache    PMH: Migraines  . Herpes genitalia   . Hypertrophy of prostate without urinary obstruction and other lower urinary tract symptoms (LUTS)   . Lack of coordination   . Lumbago   . Nausea and vomiting 11/30/2016  . Osteoarthrosis, unspecified whether generalized or localized, unspecified site   . Other and unspecified hyperlipidemia   . Other malaise and fatigue   . Pneumonia   . Reflux esophagitis   . Skin cancer   . Sleep apnea    does not wear CPAP  . Syncope and collapse   . Urinary frequency   . Vitamin D deficiency     ALLERGIES:  is allergic to chantix [varenicline] and zoloft [sertraline hcl].  MEDICATIONS:  Current Outpatient Prescriptions  Medication Sig Dispense Refill  . apixaban (ELIQUIS) 5 MG TABS tablet Take 5 mg by mouth 2 (two) times daily.    Marland Kitchen aspirin 81 MG tablet Take 81 mg by mouth daily. Take one tablet once a day    . carvedilol (COREG) 3.125 MG tablet Take 1 tablet (3.125 mg total) by mouth 2 (two) times daily with a meal. 60 tablet 0  . escitalopram (LEXAPRO) 5 MG tablet TAKE 1 TABLET(5 MG) BY MOUTH EVERY MORNING 30 tablet 2  . fish oil-omega-3 fatty acids 1000 MG capsule Take 2 g by mouth daily.     Marland Kitchen gabapentin (NEURONTIN) 300 MG  capsule Take 1 capsule (300 mg total) by mouth 3 (three) times daily. 90 capsule 3  . Glucosamine-Chondroitin (GLUCOSAMINE CHONDR COMPLEX PO) Take 1 tablet by mouth daily.    . Multiple Vitamins-Minerals (MULTIVITAMIN WITH MINERALS) tablet Take 1 tablet by mouth daily.    . ondansetron (ZOFRAN) 4 MG tablet Take 1 tablet (4 mg total) by mouth every 6 (six) hours as needed for nausea. 20 tablet 0  . oxyCODONE-acetaminophen (PERCOCET/ROXICET) 5-325 MG tablet Take 1 tablet by mouth every 6 (six) hours as needed for severe pain. 30 tablet 0  .  pantoprazole (PROTONIX) 20 MG tablet TAKE 1 TABLET(20 MG) BY MOUTH DAILY 90 tablet 1  . predniSONE (STERAPRED UNI-PAK 21 TAB) 10 MG (21) TBPK tablet Use as directed 21 tablet 0  . QUEtiapine (SEROQUEL) 25 MG tablet Take 1 tablet (25 mg total) by mouth at bedtime. 30 tablet 3  . simvastatin (ZOCOR) 20 MG tablet TAKE 1 TABLET(20 MG) BY MOUTH DAILY 90 tablet 0  . tamsulosin (FLOMAX) 0.4 MG CAPS capsule TAKE 1 CAPSULE(0.4 MG) BY MOUTH DAILY 90 capsule 1   No current facility-administered medications for this visit.     SURGICAL HISTORY:  Past Surgical History:  Procedure Laterality Date  . CARDIAC CATHETERIZATION  2000   exact date unclear, was done in [place], [place]. No PCI  . COLONOSCOPY    . EYE SURGERY    . IR GENERIC HISTORICAL  12/04/2016   IR US GUIDE VASC ACCESS RIGHT 12/04/2016 Greggory Keen, MD [facility]  . IR GENERIC HISTORICAL  12/04/2016   IR FLUORO GUIDE PORT INSERTION RIGHT 12/04/2016 Greggory Keen, MD [facility]  . poly vocal cords  1985  . SKIN CANCER EXCISION  2011  . TONSILLECTOMY    . VASECTOMY    . VIDEO BRONCHOSCOPY WITH ENDOBRONCHIAL ULTRASOUND N/A 11/20/2016   Procedure: VIDEO BRONCHOSCOPY WITH ENDOBRONCHIAL ULTRASOUND;  Surgeon: Collene Gobble, MD;  Location: [facility];  Service: Thoracic;  Laterality: N/A;    REVIEW OF SYSTEMS:  A comprehensive review of systems was negative except for: Constitutional: positive for fatigue Musculoskeletal: positive for arthralgias and bone pain   PHYSICAL EXAMINATION: General appearance: alert, cooperative, fatigued and no distress Head: Normocephalic, without obvious abnormality, atraumatic Neck: no adenopathy, no JVD, supple, symmetrical, trachea midline and thyroid not enlarged, symmetric, no tenderness/mass/nodules Lymph nodes: Cervical, supraclavicular, and axillary nodes normal. Resp: clear to auscultation bilaterally Back: symmetric, no curvature. ROM normal. No CVA tenderness. Cardio: regular rate and rhythm, S1, S2  normal, no murmur, click, rub or gallop GI: soft, non-tender; bowel sounds normal; no masses,  no organomegaly Extremities: extremities normal, atraumatic, no cyanosis or edema  ECOG PERFORMANCE STATUS: 1 - Symptomatic but completely ambulatory  Blood pressure 132/74, pulse 73, temperature 98.1 F (36.7 C), temperature source Oral, resp. rate 18, height 5\' 10"  (1.778 m), weight 152 lb 12.8 oz (69.3 kg), SpO2 92 %.  LABORATORY DATA: Lab Results  Component Value Date   WBC 7.8 07/29/2017   HGB 9.9 (L) 07/29/2017   HCT 32.6 (L) 07/29/2017   MCV 84.2 07/29/2017   PLT 180 07/29/2017      Chemistry      Component Value Date/Time   NA 138 07/29/2017 0926   K 4.1 07/29/2017 0926   CL 104 07/15/2017 0503   CO2 27 07/29/2017 0926   BUN 16.0 07/29/2017 0926   CREATININE 1.1 07/29/2017 0926      Component Value Date/Time   CALCIUM 9.5 07/29/2017 0926  ALKPHOS 70 07/29/2017 0926   AST 72 (H) 07/29/2017 0926   ALT 16 07/29/2017 0926   BILITOT 0.52 07/29/2017 0926       RADIOGRAPHIC STUDIES: Ct Angio Chest Pe W And/or Wo Contrast  Result Date: 07/13/2017 CLINICAL DATA:  Concern for pulmonary embolism. History of small cell lung cancer. EXAM: CT ANGIOGRAPHY CHEST WITH CONTRAST TECHNIQUE: Multidetector CT imaging of the chest was performed using the standard protocol during bolus administration of intravenous contrast. Multiplanar CT image reconstructions and MIPs were obtained to evaluate the vascular anatomy. CONTRAST:  100 cc Isovue 370 intravenous contrast. COMPARISON:  CT chest dated April 26, 2017. FINDINGS: Cardiovascular: Satisfactory opacification of the pulmonary arteries to the segmental level. Again noted is chronic attenuation of the pulmonary vessels in the left lower lobe, with probable new filling defects in the medial and posterior basal segmental and subsegmental arteries, for example series 7, image 183. Increased RV/LV ratio, measuring 1.0 Normal heart size. Trace  pericardial effusion. Coronary, aortic arch, and branch vessel atherosclerotic vascular disease. Right internal jugular Port-A-Cath is unchanged. Mediastinum/Nodes: No enlarged mediastinal, hilar, or axillary lymph nodes. A prominent subcarinal lymph node measuring up to 9 mm in short axis is unchanged. Thyroid gland, trachea, and esophagus demonstrate no significant findings. Lungs/Pleura: Small left pleural effusion, decreased in size when compared to prior study. The known left infrahilar mass appears slightly smaller when compared to prior study, measuring approximately 3.7 x 1.4 cm, previously 4.0 x 1.5 cm. Surrounding pulmonary opacities in the left lower lobe are similar. Slightly increased bronchiectasis and opacity in the paramediastinal left upper lobe. Stable narrowing of the left lower lobe bronchus with unchanged peripheral left lower lobe bronchiectasis. Moderate to severe centrilobular emphysema is similar to prior study. No pneumothorax. Upper Abdomen: No acute abnormality. Musculoskeletal: No chest wall abnormality. No acute or significant osseous findings. Stable degenerative changes of the thoracic spine. Review of the MIP images confirms the above findings. IMPRESSION: 1. Chronically attenuated pulmonary vessels in the left lower lobe, with probable new filling defects in the medial and posterior basal segmental and subsegmental arteries, suspicious for pulmonary emboli. There is CT evidence of right heart strain (RV/LV Ratio = 1.0) consistent with at least submassive (intermediate risk) PE. The presence of right heart strain has been associated with an increased risk of morbidity and mortality. Please activate Code PE by paging [phone]. 2. Slight interval decrease in size of left infrahilar mass. Adjacent left lower lobe pulmonary opacities are also similar, likely representing postobstructive and radiation pneumonitis. 3. Slightly increased bronchiectasis and pulmonary opacities in the  paramediastinal left upper lobe, likely reflecting radiation pneumonitis. 4.  Aortic atherosclerosis (ICD10-I70.0). 5.  Emphysema (ICD10-J43.9). These results were called by telephone at the time of interpretation on 07/13/2017 at 9:13 am to Dr. Merrily Pew, who verbally acknowledged these results. Electronically Signed   By: Titus Dubin M.D.   On: 07/13/2017 09:18    ASSESSMENT AND PLAN:  This is a very pleasant 82 years old white male with limited stage small cell lung cancer and he underwent systemic chemotherapy with carboplatin and etoposide status post 6 cycles. He has a very good response after this course of treatment. He is currently on observation. The patient and his family are interested in referral to hospice service. He probably would not meet the criteria for hospice evaluation because of his good response to the treatment and no evidence of disease progression on the recent scan. I respected the patient his wishes and referred  him to the hospice service of [facility]. For the pulmonary embolism he will continue his current treatment with Eliquis. For pain management he is currently on oxycodone as needed. I will see him on as-needed basis at this point. If the patient changes his mind regarding hospice and would like to continue his follow-up and care, I would see him back for follow-up visit in 3 months with repeat CT scan of the chest. The patient was advised to call immediately if he has any concerning symptoms in the interval. The patient voices understanding of current disease status and treatment options and is in agreement with the current care plan. All questions were answered. The patient knows to call the clinic with any problems, questions or concerns. We can certainly see the patient much sooner if necessary. I spent 10 minutes counseling the patient face to face. The total time spent in the appointment was 15 minutes.  Disclaimer: This note was dictated with voice  recognition software. Similar sounding words can inadvertently be transcribed and may not be corrected upon review.

## 2017-08-03 NOTE — Telephone Encounter (Signed)
referral called to [facility].

## 2017-08-04 ENCOUNTER — Telehealth: Payer: Self-pay

## 2017-08-04 ENCOUNTER — Telehealth: Payer: Self-pay | Admitting: Emergency Medicine

## 2017-08-04 NOTE — Telephone Encounter (Signed)
[facility], spoke with [place].  Clarified MD referred pt to hospice, pt in agreement.  Per MD pt has the option to restart chemo if he chooses to do so in the future.  [name] will send nurse to pt's home for hospice evaluation.  No further questions.

## 2017-08-04 NOTE — Telephone Encounter (Signed)
Received call from [employer], Therapist, sports at [facility].  Pt has declined hospice referral/evaluation appt at this time.  Per [employer] pt "is not sure yet" if he would like to proceed with hospice at this time.  Message given to MD for review.

## 2017-08-04 NOTE — Telephone Encounter (Signed)
-----   Message from Lucile Crater, RN sent at 08/04/2017  9:39 AM EDT ----- Regarding: FW: hospice referral   ----- Message ----- From: Ardeen Garland, RN Sent: 08/03/2017   4:27 PM To: Lucile Crater, RN Subject: hospice referral                               Call [facility] =-has questions about referral.

## 2017-08-04 NOTE — Telephone Encounter (Signed)
I called patient to see how his leg has been feeling. I left a message for patient to call the office.

## 2017-08-05 ENCOUNTER — Telehealth: Payer: Self-pay | Admitting: Internal Medicine

## 2017-08-05 NOTE — Telephone Encounter (Signed)
No follow up appointment per 10/23 los.

## 2017-08-06 ENCOUNTER — Other Ambulatory Visit: Payer: Self-pay | Admitting: *Deleted

## 2017-08-06 DIAGNOSIS — C3492 Malignant neoplasm of unspecified part of left bronchus or lung: Secondary | ICD-10-CM

## 2017-08-06 DIAGNOSIS — G893 Neoplasm related pain (acute) (chronic): Secondary | ICD-10-CM

## 2017-08-06 DIAGNOSIS — Z5111 Encounter for antineoplastic chemotherapy: Secondary | ICD-10-CM

## 2017-08-06 MED ORDER — OXYCODONE-ACETAMINOPHEN 5-325 MG PO TABS
1.0000 | ORAL_TABLET | Freq: Four times a day (QID) | ORAL | 0 refills | Status: DC | PRN
Start: 2017-08-06 — End: 2017-08-21

## 2017-08-06 NOTE — Telephone Encounter (Signed)
Patient called and stated that his leg is better but still uncomfortable between his hip and knee. Patient is also concern with pain around his right rib cage. Patient stated because of this pain he has to sleep sitting up. Stated that he is taking Oxycodone, Ibuprofen and Salonpas. Patient is wondering if it is ok to be sleeping sitting up. Please Advise.

## 2017-08-09 NOTE — Telephone Encounter (Signed)
Patient notified. Patient scheduled an appointment to be seen tomorrow due to Possible UTI and pain in leg

## 2017-08-09 NOTE — Telephone Encounter (Signed)
This is okay to sleep sitting up if he is able to be comfortable and sleep this way.

## 2017-08-10 ENCOUNTER — Ambulatory Visit: Payer: Medicare Other | Admitting: Nurse Practitioner

## 2017-08-11 ENCOUNTER — Ambulatory Visit (INDEPENDENT_AMBULATORY_CARE_PROVIDER_SITE_OTHER): Payer: Medicare Other | Admitting: Nurse Practitioner

## 2017-08-11 ENCOUNTER — Encounter: Payer: Self-pay | Admitting: Nurse Practitioner

## 2017-08-11 VITALS — BP 116/68 | HR 85 | Temp 97.5°F | Resp 17 | Ht 70.0 in | Wt 160.0 lb

## 2017-08-11 DIAGNOSIS — I2510 Atherosclerotic heart disease of native coronary artery without angina pectoris: Secondary | ICD-10-CM

## 2017-08-11 DIAGNOSIS — R309 Painful micturition, unspecified: Secondary | ICD-10-CM | POA: Diagnosis not present

## 2017-08-11 DIAGNOSIS — M79604 Pain in right leg: Secondary | ICD-10-CM

## 2017-08-11 NOTE — Patient Instructions (Signed)
MRI has been ordered to evaluate leg pain, this could be due to nerve compression in spine

## 2017-08-11 NOTE — Progress Notes (Signed)
Careteam: Patient Care Team: [name], NP as PCP - General (Nurse Practitioner) [name], OD as Referring Physician (Optometry)  Advanced Directive information Does Patient Have a Medical Advance Directive?: Yes, Type of Advance Directive: [place];Living will  Allergies  Allergen Reactions  . Chantix [Varenicline] Other (See Comments)    Makes patient suicidal  . Zoloft [Sertraline Hcl] Other (See Comments)    Makes patient suicidal    Chief Complaint  Patient presents with  . Acute Visit    Pt is being seen for right leg pain. Pt is also still having urinary burning.      HPI: Patient is a 82 y.o. male seen in the office today due to leg pain.  Pt was seen on 07/29/17 due to right leg pain. No injury noted. He was treated with a prednisone taper but states it did not help.  Reports pain "moves" pain 8/10 Heat makes worse Sometimes in hurts in his upper outer thigh, sometimes hurts in his knee and other times its to the outside of shin. Unable to describe the pain but states it is in the muscle. Denies spasm. States during visit he was experiencing the right upper thigh pain but could not describe it.  Took ibuprofen and an oxycodone and this does not help with the pain. Also has taken gabapentin but reports this does not help.  Reports shingles is a numb sensation and this is different.   Also having pain in his chest from lung cancer- Dr Julien Nordmann contributed this to PE or radiation. Makes it very uncomfortable to lay down therefore he has to sleep sitting up which he does not prefer.   Urinary pain- got better after treated with antibiotic but now with pain again.  Has called urology first available appt in late November. Reports slight pain in groin area. Taking pyridium without benefit.   Review of Systems:  Review of Systems  Constitutional: Negative for chills, diaphoresis, fever, malaise/fatigue and weight loss.  Respiratory:  Negative for cough and sputum production.        Pain to chest from radiation   Cardiovascular: Negative for chest pain and palpitations.  Genitourinary: Positive for dysuria. Negative for flank pain, frequency and urgency.  Musculoskeletal: Positive for joint pain (righ knee but most of the pain going down the side of the right leg) and myalgias (Right leg).  Skin: Negative for rash.  Neurological: Negative for dizziness, tingling, weakness and headaches.  Psychiatric/Behavioral: Negative for depression and suicidal ideas.    Past Medical History:  Diagnosis Date  . Allergic rhinitis due to pollen   . Anginal pain ([place])    Past medical history " does not seem to be a problem at all now."  . Cancer associated pain 02/08/2017  . Coronary atherosclerosis of native coronary artery   . Depressive disorder, not elsewhere classified   . Dysuria 01/18/2017  . Encounter for antineoplastic chemotherapy 11/30/2016  . GERD (gastroesophageal reflux disease)   . Goals of care, counseling/discussion 11/30/2016  . Headache    PMH: Migraines  . Herpes genitalia   . Hypertrophy of prostate without urinary obstruction and other lower urinary tract symptoms (LUTS)   . Lack of coordination   . Lumbago   . Nausea and vomiting 11/30/2016  . Osteoarthrosis, unspecified whether generalized or localized, unspecified site   . Other and unspecified hyperlipidemia   . Other malaise and fatigue   . Pneumonia   . Reflux esophagitis   .  Skin cancer   . Sleep apnea    does not wear CPAP  . Syncope and collapse   . Urinary frequency   . Vitamin D deficiency    Past Surgical History:  Procedure Laterality Date  . CARDIAC CATHETERIZATION  2000   exact date unclear, was done in [place], [place]. No PCI  . COLONOSCOPY    . EYE SURGERY    . IR GENERIC HISTORICAL  12/04/2016   IR US GUIDE VASC ACCESS RIGHT 12/04/2016 Greggory Keen, MD [facility]  . IR GENERIC HISTORICAL  12/04/2016   IR FLUORO GUIDE PORT  INSERTION RIGHT 12/04/2016 Greggory Keen, MD [facility]  . poly vocal cords  1985  . SKIN CANCER EXCISION  2011  . TONSILLECTOMY    . VASECTOMY    . VIDEO BRONCHOSCOPY WITH ENDOBRONCHIAL ULTRASOUND N/A 11/20/2016   Procedure: VIDEO BRONCHOSCOPY WITH ENDOBRONCHIAL ULTRASOUND;  Surgeon: Collene Gobble, MD;  Location: [facility];  Service: Thoracic;  Laterality: N/A;   Social History:   reports that he quit smoking about a year ago. His smoking use included Cigarettes. He has a 30.00 pack-year smoking history. He has never used smokeless tobacco. He reports that he does not drink alcohol or use drugs.  Family History  Problem Relation Age of Onset  . Heart disease Father   . Cancer Father   . Cancer Brother   . Cancer Brother     Medications: Patient's Medications  New Prescriptions   No medications on file  Previous Medications   APIXABAN (ELIQUIS) 5 MG TABS TABLET    Take 5 mg by mouth 2 (two) times daily.   ASPIRIN 81 MG TABLET    Take 81 mg by mouth daily. Take one tablet once a day   CARVEDILOL (COREG) 3.125 MG TABLET    Take 1 tablet (3.125 mg total) by mouth 2 (two) times daily with a meal.   ESCITALOPRAM (LEXAPRO) 5 MG TABLET    TAKE 1 TABLET(5 MG) BY MOUTH EVERY MORNING   FISH OIL-OMEGA-3 FATTY ACIDS 1000 MG CAPSULE    Take 2 g by mouth daily.    GABAPENTIN (NEURONTIN) 300 MG CAPSULE    Take 1 capsule (300 mg total) by mouth 3 (three) times daily.   GLUCOSAMINE-CHONDROITIN (GLUCOSAMINE CHONDR COMPLEX PO)    Take 1 tablet by mouth daily.   MULTIPLE VITAMINS-MINERALS (MULTIVITAMIN WITH MINERALS) TABLET    Take 1 tablet by mouth daily.   OXYCODONE-ACETAMINOPHEN (PERCOCET/ROXICET) 5-325 MG TABLET    Take 1 tablet by mouth every 6 (six) hours as needed for severe pain.   PANTOPRAZOLE (PROTONIX) 20 MG TABLET    TAKE 1 TABLET(20 MG) BY MOUTH DAILY   QUETIAPINE (SEROQUEL) 25 MG TABLET    Take 1 tablet (25 mg total) by mouth at bedtime.   SIMVASTATIN (ZOCOR) 20 MG TABLET    TAKE 1  TABLET(20 MG) BY MOUTH DAILY   TAMSULOSIN (FLOMAX) 0.4 MG CAPS CAPSULE    TAKE 1 CAPSULE(0.4 MG) BY MOUTH DAILY  Modified Medications   No medications on file  Discontinued Medications   ONDANSETRON (ZOFRAN) 4 MG TABLET    Take 1 tablet (4 mg total) by mouth every 6 (six) hours as needed for nausea.     Physical Exam:  Vitals:   08/11/17 1516  BP: 116/68  Pulse: 85  Resp: 17  Temp: (!) 97.5 F (36.4 C)  TempSrc: Oral  SpO2: 96%  Weight: 160 lb (72.6 kg)  Height: 5\' 10"  (1.778  m)   Body mass index is 22.96 kg/m.  Physical Exam  Constitutional: He is oriented to person, place, and time. He appears well-developed and well-nourished. No distress.  Neck: Normal range of motion. Neck supple.  Cardiovascular: Normal rate, regular rhythm and normal heart sounds.   Pulmonary/Chest: Effort normal and breath sounds normal.  Abdominal: Soft. Bowel sounds are normal. He exhibits no distension. There is no tenderness. There is no guarding.  Musculoskeletal: He exhibits edema (2+ bilaterally) and tenderness.       Right hip: He exhibits tenderness.       Legs: Areas of pain that run down right leg. Bruising noted to left and right leg. Denies injury.  Lymphadenopathy:    He has no cervical adenopathy.  Neurological: He is alert and oriented to person, place, and time. No sensory deficit.  Reflex Scores:      Patellar reflexes are 1+ on the right side and 2+ on the left side. Weakness noted to right side  Skin: Skin is warm. Capillary refill takes less than 2 seconds. He is not diaphoretic.  Psychiatric: He has a normal mood and affect.    Labs reviewed: Basic Metabolic Panel:  Recent Labs  10/22/16 1601  07/13/17 0405 07/13/17 1130 07/14/17 0205 07/15/17 0503 07/29/17 0926  NA  --   < > 137  --  140 139 138  K  --   < > 3.8  --  3.7 3.6 4.1  CL  --   < > 102  --  103 104  --   CO2  --   < > 25  --  28 26 27   GLUCOSE  --   < > 98  --  110* 94 104  BUN  --   < > 17  --   17 14 16.0  CREATININE  --   < > 1.15  --  1.17 1.09 1.1  CALCIUM  --   < > 9.3  --  9.1 8.6* 9.5  MG  --   --   --   --   --  1.5*  --   TSH 2.20  --   --  4.248  --   --   --   < > = values in this interval not displayed. Liver Function Tests:  Recent Labs  04/26/17 0747 07/13/17 0405 07/29/17 0926  AST 17 37 72*  ALT 10 11* 16  ALKPHOS 72 64 70  BILITOT 0.45 0.8 0.52  PROT 6.8 7.0 7.1  ALBUMIN 3.4* 3.5 3.1*    Recent Labs  07/13/17 0405  LIPASE 31   No results for input(s): AMMONIA in the last 8760 hours. CBC:  Recent Labs  03/29/17 1124 04/26/17 0747  07/14/17 0205 07/15/17 0503 07/29/17 0926  WBC 10.6* 5.5  < > 5.1 5.2 7.8  NEUTROABS 9.9* 3.7  --   --   --  5.7  HGB 9.2* 10.4*  < > 10.9* 9.8* 9.9*  HCT 28.1* 33.6*  < > 35.6* 32.8* 32.6*  MCV 98.1* 100.6*  < > 85.2 85.0 84.2  PLT 155 184  < > 216 177 180  < > = values in this interval not displayed. Lipid Panel:  Recent Labs  02/15/17  CHOL 114  HDL 47  LDLCALC 56  TRIG 53   TSH:  Recent Labs  10/22/16 1601 07/13/17 1130  TSH 2.20 4.248   A1C: Lab Results  Component Value Date   HGBA1C 5.7 (H) 07/13/2016  PET SCAN 12/03/16 IMPRESSION: 1. Markedly hypermetabolic left infrahilar mass, consistent with known neoplasm. Adjacent airspace disease shows FDG uptake in may be postobstructive inflammatory change but tumor spread is not entirely excluded. 2. Low level uptake identified and nonenlarged subcarinal lymph nodes. Metastatic disease is a possibility. 3. Small foci of uptake in the T12 and L1 vertebral bodies without associated bony abnormality on CT imaging. There is also uptake in the L3 and L4 spinous processes did show evidence of heterogeneous mineralization and expansion. Given the consecutive involvement of the spinous processes, this is probably degenerative. Bone scan or MRI of the lumbar spine without with contrast could be used to further evaluate, as clinically  warranted. Assessment/Plan 1. Right leg pain -pain not responsive to prednisone taper, oxycodone or NSAIDS.  Has a hard time describing and quantifying pain.  Reviewed PET scan above, at this time will get MRI of lumbar spine for further evaluation.  -no warning signs at this time, however instructed to seek immediate care if leg becomes significantly weaker, loss of bowel or bladder occurs.  - MR Lumbar Spine Wo Contrast; Future  2. Painful urination -recurring. Pt was treated in hospital with suprax for 5 days then had persistent dysuria therefore was re-cultured in office, he was placed on Augmentin and found to have Genus Enterococcus, he reported symptoms resolved at that time but they have since returned. Call placed and made with urology office for 1:30 pm tomorrow  Next appt: as scheduled, sooner if needed Shylyn Younce K. Harle Battiest  [facility] [phone] 8 am - 5 pm) [phone] (after hours)

## 2017-08-12 ENCOUNTER — Inpatient Hospital Stay (HOSPITAL_COMMUNITY)
Admission: EM | Admit: 2017-08-12 | Discharge: 2017-08-21 | DRG: 055 | Disposition: A | Discharge status: Skilled Nursing Facility | Payer: Medicare Other | Attending: Family Medicine | Admitting: Family Medicine

## 2017-08-12 ENCOUNTER — Emergency Department (HOSPITAL_COMMUNITY): Payer: Medicare Other

## 2017-08-12 ENCOUNTER — Encounter (HOSPITAL_COMMUNITY): Payer: Self-pay

## 2017-08-12 ENCOUNTER — Telehealth: Payer: Self-pay | Admitting: Medical Oncology

## 2017-08-12 ENCOUNTER — Telehealth: Payer: Self-pay

## 2017-08-12 DIAGNOSIS — Z85828 Personal history of other malignant neoplasm of skin: Secondary | ICD-10-CM

## 2017-08-12 DIAGNOSIS — Z515 Encounter for palliative care: Secondary | ICD-10-CM | POA: Diagnosis present

## 2017-08-12 DIAGNOSIS — R45851 Suicidal ideations: Secondary | ICD-10-CM | POA: Diagnosis present

## 2017-08-12 DIAGNOSIS — M5116 Intervertebral disc disorders with radiculopathy, lumbar region: Secondary | ICD-10-CM | POA: Diagnosis present

## 2017-08-12 DIAGNOSIS — G893 Neoplasm related pain (acute) (chronic): Secondary | ICD-10-CM | POA: Diagnosis present

## 2017-08-12 DIAGNOSIS — M5127 Other intervertebral disc displacement, lumbosacral region: Secondary | ICD-10-CM | POA: Diagnosis present

## 2017-08-12 DIAGNOSIS — B952 Enterococcus as the cause of diseases classified elsewhere: Secondary | ICD-10-CM | POA: Diagnosis present

## 2017-08-12 DIAGNOSIS — K649 Unspecified hemorrhoids: Secondary | ICD-10-CM | POA: Diagnosis present

## 2017-08-12 DIAGNOSIS — N138 Other obstructive and reflux uropathy: Secondary | ICD-10-CM | POA: Diagnosis present

## 2017-08-12 DIAGNOSIS — F329 Major depressive disorder, single episode, unspecified: Secondary | ICD-10-CM | POA: Diagnosis present

## 2017-08-12 DIAGNOSIS — Z66 Do not resuscitate: Secondary | ICD-10-CM | POA: Diagnosis present

## 2017-08-12 DIAGNOSIS — I482 Chronic atrial fibrillation, unspecified: Secondary | ICD-10-CM | POA: Diagnosis present

## 2017-08-12 DIAGNOSIS — K219 Gastro-esophageal reflux disease without esophagitis: Secondary | ICD-10-CM | POA: Diagnosis present

## 2017-08-12 DIAGNOSIS — Z7901 Long term (current) use of anticoagulants: Secondary | ICD-10-CM

## 2017-08-12 DIAGNOSIS — N401 Enlarged prostate with lower urinary tract symptoms: Secondary | ICD-10-CM | POA: Diagnosis present

## 2017-08-12 DIAGNOSIS — Z7982 Long term (current) use of aspirin: Secondary | ICD-10-CM

## 2017-08-12 DIAGNOSIS — C7949 Secondary malignant neoplasm of other parts of nervous system: Principal | ICD-10-CM | POA: Diagnosis present

## 2017-08-12 DIAGNOSIS — R29898 Other symptoms and signs involving the musculoskeletal system: Secondary | ICD-10-CM | POA: Diagnosis present

## 2017-08-12 DIAGNOSIS — Z86711 Personal history of pulmonary embolism: Secondary | ICD-10-CM

## 2017-08-12 DIAGNOSIS — I10 Essential (primary) hypertension: Secondary | ICD-10-CM | POA: Diagnosis present

## 2017-08-12 DIAGNOSIS — G4730 Sleep apnea, unspecified: Secondary | ICD-10-CM | POA: Diagnosis present

## 2017-08-12 DIAGNOSIS — M5410 Radiculopathy, site unspecified: Secondary | ICD-10-CM

## 2017-08-12 DIAGNOSIS — R52 Pain, unspecified: Secondary | ICD-10-CM

## 2017-08-12 DIAGNOSIS — Z79899 Other long term (current) drug therapy: Secondary | ICD-10-CM

## 2017-08-12 DIAGNOSIS — N390 Urinary tract infection, site not specified: Secondary | ICD-10-CM | POA: Diagnosis present

## 2017-08-12 DIAGNOSIS — M549 Dorsalgia, unspecified: Secondary | ICD-10-CM

## 2017-08-12 DIAGNOSIS — Z7189 Other specified counseling: Secondary | ICD-10-CM

## 2017-08-12 DIAGNOSIS — I2510 Atherosclerotic heart disease of native coronary artery without angina pectoris: Secondary | ICD-10-CM | POA: Diagnosis present

## 2017-08-12 DIAGNOSIS — E785 Hyperlipidemia, unspecified: Secondary | ICD-10-CM | POA: Diagnosis present

## 2017-08-12 DIAGNOSIS — Z9221 Personal history of antineoplastic chemotherapy: Secondary | ICD-10-CM

## 2017-08-12 DIAGNOSIS — W19XXXA Unspecified fall, initial encounter: Secondary | ICD-10-CM

## 2017-08-12 DIAGNOSIS — Z923 Personal history of irradiation: Secondary | ICD-10-CM

## 2017-08-12 DIAGNOSIS — M48061 Spinal stenosis, lumbar region without neurogenic claudication: Secondary | ICD-10-CM | POA: Diagnosis present

## 2017-08-12 DIAGNOSIS — C3490 Malignant neoplasm of unspecified part of unspecified bronchus or lung: Secondary | ICD-10-CM | POA: Diagnosis present

## 2017-08-12 DIAGNOSIS — Z87891 Personal history of nicotine dependence: Secondary | ICD-10-CM

## 2017-08-12 DIAGNOSIS — Z9181 History of falling: Secondary | ICD-10-CM

## 2017-08-12 DIAGNOSIS — C7951 Secondary malignant neoplasm of bone: Secondary | ICD-10-CM | POA: Diagnosis present

## 2017-08-12 LAB — URINALYSIS, ROUTINE W REFLEX MICROSCOPIC
Bilirubin Urine: NEGATIVE
Glucose, UA: NEGATIVE mg/dL
Hgb urine dipstick: NEGATIVE
KETONES UR: 5 mg/dL — AB
Nitrite: NEGATIVE
PH: 7 (ref 5.0–8.0)
PROTEIN: 30 mg/dL — AB
SPECIFIC GRAVITY, URINE: 1.011 (ref 1.005–1.030)
SQUAMOUS EPITHELIAL / LPF: NONE SEEN

## 2017-08-12 LAB — BASIC METABOLIC PANEL
Anion gap: 11 (ref 5–15)
BUN: 19 mg/dL (ref 6–20)
CO2: 25 mmol/L (ref 22–32)
Calcium: 9.3 mg/dL (ref 8.9–10.3)
Chloride: 101 mmol/L (ref 101–111)
Creatinine, Ser: 1.03 mg/dL (ref 0.61–1.24)
GFR calc Af Amer: >60 mL/min (ref >60)
GFR calc non Af Amer: >60 mL/min (ref >60)
Glucose, Bld: 86 mg/dL (ref 65–99)
Potassium: 4 mmol/L (ref 3.5–5.1)
Sodium: 137 mmol/L (ref 135–145)

## 2017-08-12 LAB — CBC WITH DIFFERENTIAL/PLATELET
Basophils Absolute: 0 10*3/uL (ref 0.0–0.1)
Basophils Relative: 0 %
Eosinophils Absolute: 0 10*3/uL (ref 0.0–0.7)
Eosinophils Relative: 1 %
HCT: 30.6 % — ABNORMAL LOW (ref 39.0–52.0)
Hemoglobin: 9.2 g/dL — ABNORMAL LOW (ref 13.0–17.0)
Lymphocytes Relative: 12 %
Lymphs Abs: 0.9 10*3/uL (ref 0.7–4.0)
MCH: 25.3 pg — ABNORMAL LOW (ref 26.0–34.0)
MCHC: 30.1 g/dL (ref 30.0–36.0)
MCV: 84.3 fL (ref 78.0–100.0)
Monocytes Absolute: 0.4 10*3/uL (ref 0.1–1.0)
Monocytes Relative: 5 %
Neutro Abs: 6 10*3/uL (ref 1.7–7.7)
Neutrophils Relative %: 82 %
Platelets: 192 10*3/uL (ref 150–400)
RBC: 3.63 MIL/uL — ABNORMAL LOW (ref 4.22–5.81)
RDW: 19.3 % — ABNORMAL HIGH (ref 11.5–15.5)
WBC: 7.2 10*3/uL (ref 4.0–10.5)

## 2017-08-12 MED ORDER — MORPHINE SULFATE (PF) 4 MG/ML IV SOLN
4.0000 mg | Freq: Once | INTRAVENOUS | Status: AC
  Administered 2017-08-12: 4 mg via INTRAVENOUS
  Filled 2017-08-12: qty 1

## 2017-08-12 NOTE — ED Notes (Signed)
Family member of patient came to desk stating patient is on percocet for pain at home and is beginning to get in more pain at this time.  This RN explained to family member where patient is in line for a room.  Family member understandable.

## 2017-08-12 NOTE — Telephone Encounter (Signed)
Message left on clinical intake voicemail:   Patient had to cancel appointment with [facility] today due to fall last night and further injury to leg. Patient would like for Christian Munoz to reschedule his appointment with [facility].  I called patient back, left message for him to contact [facility] at [phone], since appointment was already schedule by our office and patient cancelled he is responsible for rescheduling

## 2017-08-12 NOTE — ED Provider Notes (Signed)
[facility] EMERGENCY DEPARTMENT Provider Note   CSN: [id number] Arrival date & time: 08/12/17  1727     History   Chief Complaint Chief Complaint  Patient presents with  . Fall    HPI Christian Munoz is a 82 y.o. male with history of CAD, hypertrophy of the prostate, recent PE anticoagulated on Eliquis who presents following fall.  Patient reports he slid off the bed and fell backwards and hit his head.  He did not lose consciousness.  He said it was not a hard head.  He reports this is his second fall in 2 weeks.  His nurse practitioner wanted him to be evaluated today for this reason.  He has been evaluated twice for an ongoing R leg pain from his hip to his ankle.  He has an MRI scheduled once his insurance approves it.  He denies any saddle anesthesia or loss of bowel or bladder control.  He denies any back pain.  He denies any pain from the fall.  He has been having ongoing urinary tract infection for which he has been treated with Suprax and another unknown medication that he finished 6 days ago.  He was supposed to go to urologist today, however he canceled due to his leg pain.  Patient has had intermittent right lower rib cage pain since the PE, however this is not new and he has been evaluated for this.  He denies any shortness of breath or dizziness or lightheadedness.  HPI  Past Medical History:  Diagnosis Date  . Allergic rhinitis due to pollen   . Anginal pain ([place])    Past medical history " does not seem to be a problem at all now."  . Cancer associated pain 02/08/2017  . Coronary atherosclerosis of native coronary artery   . Depressive disorder, not elsewhere classified   . Dysuria 01/18/2017  . Encounter for antineoplastic chemotherapy 11/30/2016  . GERD (gastroesophageal reflux disease)   . Goals of care, counseling/discussion 11/30/2016  . Headache    PMH: Migraines  . Herpes genitalia   . Hypertrophy of prostate without urinary obstruction and  other lower urinary tract symptoms (LUTS)   . Lack of coordination   . Lumbago   . Nausea and vomiting 11/30/2016  . Osteoarthrosis, unspecified whether generalized or localized, unspecified site   . Other and unspecified hyperlipidemia   . Other malaise and fatigue   . Pneumonia   . Reflux esophagitis   . Skin cancer   . Sleep apnea    does not wear CPAP  . Syncope and collapse   . Urinary frequency   . Vitamin D deficiency     Patient Active Problem List   Diagnosis Date Noted  . Acute lower UTI   . Small cell lung cancer in adult [facility])   . Anemia   . Hypoxemia   . Pulmonary embolism ([place]) 07/13/2017  . New onset atrial fibrillation ([place]) 07/13/2017  . Lower urinary tract infectious disease 07/13/2017  . Chronic pain 07/13/2017  . Antineoplastic chemotherapy induced anemia 03/01/2017  . Cancer associated pain 02/08/2017  . Dysuria 01/18/2017  . Fever 01/13/2017  . Port catheter in place 01/12/2017  . Small cell lung cancer, left ([place]) 11/30/2016  . Goals of care, counseling/discussion 11/30/2016  . Encounter for antineoplastic chemotherapy 11/30/2016  . History of fall 02/28/2016  . Hypertension 12/04/2014  . Benign prostatic hyperplasia with urinary obstruction 12/04/2014  . Depression 12/04/2014  . Hyperlipidemia 01/06/2013  . Reflux esophagitis   .  Osteoarthrosis, unspecified whether generalized or localized, unspecified site     Past Surgical History:  Procedure Laterality Date  . CARDIAC CATHETERIZATION  2000   exact date unclear, was done in [place], [place]. No PCI  . COLONOSCOPY    . EYE SURGERY    . IR GENERIC HISTORICAL  12/04/2016   IR US GUIDE VASC ACCESS RIGHT 12/04/2016 Greggory Keen, MD [facility]  . IR GENERIC HISTORICAL  12/04/2016   IR FLUORO GUIDE PORT INSERTION RIGHT 12/04/2016 Greggory Keen, MD [facility]  . poly vocal cords  1985  . SKIN CANCER EXCISION  2011  . TONSILLECTOMY    . VASECTOMY    . VIDEO BRONCHOSCOPY WITH ENDOBRONCHIAL  ULTRASOUND N/A 11/20/2016   Procedure: VIDEO BRONCHOSCOPY WITH ENDOBRONCHIAL ULTRASOUND;  Surgeon: Collene Gobble, MD;  Location: [facility];  Service: Thoracic;  Laterality: N/A;       Home Medications    Prior to Admission medications   Medication Sig Start Date End Date Taking? Authorizing Provider  apixaban (ELIQUIS) 5 MG TABS tablet Take 5 mg by mouth 2 (two) times daily.   Yes [provider]  aspirin 81 MG tablet Take 81 mg by mouth daily. Take one tablet once a day   Yes [provider]  escitalopram (LEXAPRO) 5 MG tablet TAKE 1 TABLET(5 MG) BY MOUTH EVERY MORNING 06/15/17  Yes Rainey Pines, MD  fish oil-omega-3 fatty acids 1000 MG capsule Take 2 g by mouth daily.    Yes [provider]  gabapentin (NEURONTIN) 300 MG capsule Take 1 capsule (300 mg total) by mouth 3 (three) times daily. 04/08/17  Yes [name], NP  Glucosamine-Chondroitin (GLUCOSAMINE CHONDR COMPLEX PO) Take 1 tablet by mouth daily.   Yes [provider]  Glycerin-Polysorbate 80 (REFRESH DRY EYE THERAPY OP) Place 1 drop into both eyes as needed (dry eye).   Yes [provider]  Multiple Vitamins-Minerals (EYE VITAMINS) CAPS Take 1 capsule by mouth daily.   Yes [provider]  Multiple Vitamins-Minerals (MULTIVITAMIN WITH MINERALS) tablet Take 1 tablet by mouth daily.   Yes [provider]  oxyCODONE-acetaminophen (PERCOCET/ROXICET) 5-325 MG tablet Take 1 tablet by mouth every 6 (six) hours as needed for severe pain. 08/06/17  Yes Curcio, Roselie Awkward, NP  pantoprazole (PROTONIX) 20 MG tablet TAKE 1 TABLET(20 MG) BY MOUTH DAILY 06/09/17  Yes [name], NP  QUEtiapine (SEROQUEL) 25 MG tablet Take 1 tablet (25 mg total) by mouth at bedtime. 06/15/17  Yes Rainey Pines, MD  simvastatin (ZOCOR) 20 MG tablet TAKE 1 TABLET(20 MG) BY MOUTH DAILY 03/11/17  Yes Estill Dooms, MD  carvedilol (COREG) 3.125 MG tablet Take 1 tablet (3.125 mg total) by mouth 2 (two)  times daily with a meal. Patient not taking: Reported on 08/12/2017 07/15/17   Allie Bossier, MD  tamsulosin (FLOMAX) 0.4 MG CAPS capsule TAKE 1 CAPSULE(0.4 MG) BY MOUTH DAILY 06/09/17   [name], NP    Family History Family History  Problem Relation Age of Onset  . Heart disease Father   . Cancer Father   . Cancer Brother   . Cancer Brother     Social History Social History  Substance Use Topics  . Smoking status: Former Smoker    Packs/day: 1.00    Years: 30.00    Types: Cigarettes    Quit date: 08/12/2016  . Smokeless tobacco: Never Used     Comment: 3 per day  . Alcohol use  No     Allergies   Chantix [varenicline] and Zoloft [sertraline hcl]   Review of Systems Review of Systems  Constitutional: Negative for chills and fever.  HENT: Negative for facial swelling and sore throat.   Respiratory: Negative for shortness of breath.   Cardiovascular: Negative for chest pain.  Gastrointestinal: Negative for abdominal pain, nausea and vomiting.  Genitourinary: Positive for dysuria.  Musculoskeletal: Positive for arthralgias (R leg pain). Negative for back pain.  Skin: Negative for rash and wound.  Neurological: Negative for syncope and headaches.  Psychiatric/Behavioral: The patient is not nervous/anxious.      Physical Exam Updated Vital Signs BP 129/67 (BP Location: Right Arm)   Pulse 84   Temp 98.9 F (37.2 C) (Oral)   Resp 16   SpO2 98%   Physical Exam  Constitutional: He appears well-developed and well-nourished. No distress.  HENT:  Head: Normocephalic and atraumatic.  Mouth/Throat: Oropharynx is clear and moist. No oropharyngeal exudate.  Eyes: Pupils are equal, round, and reactive to light. Conjunctivae are normal. Right eye exhibits no discharge. Left eye exhibits no discharge. No scleral icterus.  Neck: Normal range of motion. Neck supple. No thyromegaly present.  Cardiovascular: Normal rate, regular rhythm, normal heart sounds and intact  distal pulses.  Exam reveals no gallop and no friction rub.   No murmur heard. Pulmonary/Chest: Effort normal and breath sounds normal. No stridor. No respiratory distress. He has no wheezes. He has no rales.  Abdominal: Soft. Bowel sounds are normal. He exhibits no distension. There is no tenderness. There is no rebound and no guarding.  Musculoskeletal: He exhibits no edema.  Lymphadenopathy:    He has no cervical adenopathy.  Neurological: He is alert. Coordination normal.  Sensation intact to lower extremities, 5/5 strength in left leg, 4/5 strength in right leg; decreased reflexes to the right lower extremity  Skin: Skin is warm and dry. No rash noted. He is not diaphoretic. No pallor.  Psychiatric: He has a normal mood and affect.  Nursing note and vitals reviewed.    ED Treatments / Results  Labs (all labs ordered are listed, but only abnormal results are displayed) Labs Reviewed  URINALYSIS, ROUTINE W REFLEX MICROSCOPIC - Abnormal; Notable for the following:       Result Value   APPearance CLOUDY (*)    Ketones, ur 5 (*)    Protein, ur 30 (*)    Leukocytes, UA LARGE (*)    Bacteria, UA FEW (*)    All other components within normal limits  CBC WITH DIFFERENTIAL/PLATELET - Abnormal; Notable for the following:    RBC 3.63 (*)    Hemoglobin 9.2 (*)    HCT 30.6 (*)    MCH 25.3 (*)    RDW 19.3 (*)    All other components within normal limits  URINE CULTURE  BASIC METABOLIC PANEL    EKG  EKG Interpretation  Date/Time:  Thursday August 12 2017 21:48:15 EDT Ventricular Rate:  90 PR Interval:    QRS Duration: 81 QT Interval:  379 QTC Calculation: 464 R Axis:   84 Text Interpretation:  Sinus rhythm Probable left atrial enlargement Anteroseptal infarct, age indeterminate No acute changes No significant change since last tracing Confirmed by Varney Biles [phone]) on 08/12/2017 10:24:58 PM Also confirmed by Varney Biles [phone]), editor Laurena Spies [phone])  on  08/13/2017 7:04:32 AM       Radiology Dg Chest 2 View  Result Date: 08/12/2017 CLINICAL DATA:  Right-sided pain after fall from  bed. History of pneumonia. Former smoker. EXAM: CHEST  2 VIEW COMPARISON:  07/13/2017 chest CT FINDINGS: Top-normal size heart with aortic atherosclerosis. Port catheter is noted with tip in the distal SVC. Mild hyperinflation of the upper lobes with crowding of interstitial lung markings in the lower lobes. Streaky parenchymal opacities at the left base some which likely reflect chronic post radiation change and/or atelectasis. Superimposed pneumonia would be difficult to entirely exclude but based on history of fall, believed less likely. No acute nor suspicious osseous abnormalities. No pneumothorax. IMPRESSION: 1. Aortic atherosclerosis. 2. Left basilar atelectasis, scarring and/or post radiated change. Superimposed subtle pneumonia is not entirely excluded but based on clinical history, believed less likely. 3. No acute fracture or pneumothorax. Electronically Signed   By: Ashley Royalty M.D.   On: 08/12/2017 23:41   Dg Lumbar Spine Complete  Result Date: 08/12/2017 CLINICAL DATA:  Pain after fall EXAM: LUMBAR SPINE - COMPLETE 4+ VIEW COMPARISON:  CXR 10/23/2016 FINDINGS: Lumbosacral transitional vertebral body. S1-S2 disc noted based on 12 ribbed thoracic vertebral bodies on the chest radiograph. The lowest square vertebral body will be labeled S1. No acute compression fracture. There is moderate disc space narrowing L4-5, L5-S1 and S1-S2 with associated degenerative facet arthropathy. Minimal grade 1 anterolisthesis of L5 on S1. Aortoiliac atherosclerosis noted without definite aneurysm. IMPRESSION: 1. Transitional lumbosacral vertebral body. 2. Lower lumbar degenerative disc and facet arthropathy L4 through S2. 3. No acute osseous abnormality. 4. Minimal grade 1 anterolisthesis of L5 on S1. Electronically Signed   By: Ashley Royalty M.D.   On: 08/12/2017 23:38   Ct Head Wo  Contrast  Result Date: 08/12/2017 CLINICAL DATA:  Fall while on anti coagulation therapy. EXAM: CT HEAD WITHOUT CONTRAST TECHNIQUE: Contiguous axial images were obtained from the base of the skull through the vertex without intravenous contrast. COMPARISON:  Brain MRI 06/04/2017 FINDINGS: Brain: No mass lesion, intraparenchymal hemorrhage or extra-axial collection. No evidence of acute cortical infarct. There is periventricular hypoattenuation compatible with chronic microvascular disease. Mild atrophy Vascular: No hyperdense vessel or unexpected calcification. Skull: Normal visualized skull base, calvarium and extracranial soft tissues. Sinuses/Orbits: No sinus fluid levels or advanced mucosal thickening. No mastoid effusion. Normal orbits. IMPRESSION: Mild atrophy and chronic microvascular disease without acute abnormality. Electronically Signed   By: Ulyses Jarred M.D.   On: 08/12/2017 23:29   Dg Foot Complete Right  Result Date: 08/12/2017 CLINICAL DATA:  Bruising of the right toes after fall today. EXAM: RIGHT FOOT COMPLETE - 3+ VIEW COMPARISON:  None. FINDINGS: Hallux rigidus of the great toe with marked joint space narrowing and spurring of the first MTP. No acute fracture or malalignment is noted. There is dorsal soft tissue swelling of the forefoot. There is a tiny plantar calcaneal enthesophyte. IMPRESSION: 1. Hallux rigidus of the first MTP with marked joint space narrowing and spurring. 2. No acute osseous abnormality. 3. Dorsal soft tissue swelling over the forefoot. 4. Small plantar calcaneal enthesophyte. Electronically Signed   By: Ashley Royalty M.D.   On: 08/12/2017 23:32   Dg Hip Unilat With Pelvis 1v Left  Result Date: 08/12/2017 CLINICAL DATA:  Loss of balance and fell off bed.  Left hip pain. EXAM: DG HIP (WITH OR WITHOUT PELVIS) 1V*L* COMPARISON:  None. FINDINGS: There is no evidence of hip fracture or dislocation. There is degenerative disc space narrowing at L4-5. L5-S1 degenerative  facet hypertrophy and sclerosis. No suspicious osseous lesions of the bony pelvis and hips. Intact sacroiliac joints and pubic symphysis.  IMPRESSION: No acute fracture or malalignment of either hip. There is lower lumbar facet arthropathy and disc disease. Electronically Signed   By: Ashley Royalty M.D.   On: 08/12/2017 23:30    Procedures Procedures (including critical care time)  Medications Ordered in ED Medications  oxyCODONE-acetaminophen (PERCOCET/ROXICET) 5-325 MG per tablet 1 tablet (not administered)  fosfomycin (MONUROL) packet 3 g (not administered)  morphine 4 MG/ML injection 4 mg (4 mg Intravenous Given 08/12/17 2207)  HYDROmorphone (DILAUDID) injection 1 mg (1 mg Intravenous Given 08/13/17 0104)  LORazepam (ATIVAN) injection 0.5 mg (0.5 mg Intravenous Given 08/13/17 0240)     Initial Impression / Assessment and Plan / ED Course  I have reviewed the triage vital signs and the nursing notes.  Pertinent labs & imaging results that were available during my care of the patient were reviewed by me and considered in my medical decision making (see chart for details).  Clinical Course as of Aug 13 709  Fri Aug 13, 2017  0215 Patient unable to complete MRI due to severe back pain with lying flat. Patient given 0.5 Ativan in the MRI without relief. He was given 1 mg Dilaudid shortly before transport.  [AL]    Clinical Course User Index [AL] Frederica Kuster, PA-C    Patient with negative head CT.  However, patient with significant right leg pain resembling radicular pain.  Considering decreased lower extremity reflex on the right side, MRI of the thoracic and lumbar spine has been ordered.  Patient attempted, however he was having too much pain to lie flat despite 0.5 mg Dilaudid and 0.5 mg Ativan.  At shift change, we will try MRI again now the patient is more comfortable after Ativan and is comfortably lying flat in the bed.  If MRI is negative, plan to consult social work for placement  in nursing facility.  If emergent findings, consult neurosurgery.  Concern for metastatic disease considering recent treatment for lung cancer with chemotherapy.  Patient also has ongoing urinary tract infection, which we will treat with a one-time dose of fosfomycin.  No recent culture to base treatment, however new culture sent.  Patient already completed Suprax and another antibiotic course recently.  At shift change, patient care transferred to Domenic Moras, PA-C for continued evaluation, follow up of MRI and social work consult, and determination of disposition.  The patient is not comfortable going home considering he lives alone and his pain has left him bedridden for the past day or 2.  Contact# for patient's son, Vicente Males : [phone]. He would like to be kept updated, especially for social work discussion.  Final Clinical Impressions(s) / ED Diagnoses   Final diagnoses:  Fall, initial encounter  Radicular leg pain    New Prescriptions New Prescriptions   No medications on file         Frederica Kuster, Hershal Coria 08/13/17 [place], [place], MD 08/14/17 [phone]

## 2017-08-12 NOTE — Telephone Encounter (Signed)
Pt may need to go to the Hospital for further evaluation due to fall and leg pain. I am concerned over damage to his spine which is causing him to lose function in his leg causing his fall.

## 2017-08-12 NOTE — Telephone Encounter (Signed)
Pt called asking who he should contact for his pain .Per Julien Nordmann I told pt to contact PCP.

## 2017-08-12 NOTE — Telephone Encounter (Signed)
Spoke with patient, patient verbalized understanding of Jessica's instruction. Patient states he will get his son to take him to him to the hospital, patient sounded down at the end of called and said thanks for getting back to him so quickly

## 2017-08-12 NOTE — ED Triage Notes (Signed)
PT reports while sitting on edge of he lost his balance and fell backwards off of bed and hit head on unknown object. Denies loc, headache. PT states he has been having severe right leg pain and was unable to catch himself with the leg. Pt has mri scheduled for this. PT reports he is also being treated for uti

## 2017-08-13 ENCOUNTER — Observation Stay (HOSPITAL_COMMUNITY): Payer: Medicare Other | Admitting: Certified Registered Nurse Anesthetist

## 2017-08-13 ENCOUNTER — Emergency Department (HOSPITAL_COMMUNITY): Payer: Medicare Other

## 2017-08-13 ENCOUNTER — Encounter (HOSPITAL_COMMUNITY): Payer: Self-pay | Admitting: Certified Registered Nurse Anesthetist

## 2017-08-13 ENCOUNTER — Observation Stay (HOSPITAL_COMMUNITY): Payer: Medicare Other

## 2017-08-13 ENCOUNTER — Encounter (HOSPITAL_COMMUNITY)
Admission: EM | Disposition: A | Discharge status: Skilled Nursing Facility | Payer: Self-pay | Source: Home / Self Care | Attending: Internal Medicine

## 2017-08-13 DIAGNOSIS — R29898 Other symptoms and signs involving the musculoskeletal system: Secondary | ICD-10-CM | POA: Diagnosis not present

## 2017-08-13 DIAGNOSIS — M549 Dorsalgia, unspecified: Secondary | ICD-10-CM

## 2017-08-13 HISTORY — PX: RADIOLOGY WITH ANESTHESIA: SHX6223

## 2017-08-13 SURGERY — MRI WITH ANESTHESIA
Anesthesia: General

## 2017-08-13 MED ORDER — LACTATED RINGERS IV SOLN
INTRAVENOUS | Status: DC
  Administered 2017-08-13: 15:00:00 via INTRAVENOUS

## 2017-08-13 MED ORDER — ONDANSETRON HCL 4 MG/2ML IJ SOLN: 4.0000 mg | Freq: Four times a day (QID) | INTRAMUSCULAR | Status: DC | PRN

## 2017-08-13 MED ORDER — QUETIAPINE FUMARATE 25 MG PO TABS
25.0000 mg | ORAL_TABLET | Freq: Every day | ORAL | Status: DC
  Administered 2017-08-13 – 2017-08-19 (×7): 25 mg via ORAL
  Filled 2017-08-13 (×8): qty 1

## 2017-08-13 MED ORDER — ADULT MULTIVITAMIN W/MINERALS CH
1.0000 | ORAL_TABLET | Freq: Every day | ORAL | Status: DC
  Administered 2017-08-13 – 2017-08-21 (×7): 1 via ORAL
  Filled 2017-08-13 (×9): qty 1

## 2017-08-13 MED ORDER — ACETAMINOPHEN 325 MG PO TABS
650.0000 mg | ORAL_TABLET | Freq: Four times a day (QID) | ORAL | Status: DC | PRN
  Administered 2017-08-15 – 2017-08-18 (×2): 650 mg via ORAL
  Filled 2017-08-13 (×3): qty 2

## 2017-08-13 MED ORDER — KETOROLAC TROMETHAMINE 30 MG/ML IJ SOLN
30.0000 mg | Freq: Four times a day (QID) | INTRAMUSCULAR | Status: DC | PRN
  Administered 2017-08-13 – 2017-08-15 (×3): 30 mg via INTRAVENOUS
  Filled 2017-08-13 (×3): qty 1

## 2017-08-13 MED ORDER — GABAPENTIN 300 MG PO CAPS
300.0000 mg | ORAL_CAPSULE | Freq: Three times a day (TID) | ORAL | Status: DC
  Administered 2017-08-13 – 2017-08-21 (×24): 300 mg via ORAL
  Filled 2017-08-13 (×24): qty 1

## 2017-08-13 MED ORDER — OMEGA-3 FATTY ACIDS 1000 MG PO CAPS: 2.0000 g | ORAL_CAPSULE | Freq: Every day | ORAL | Status: DC

## 2017-08-13 MED ORDER — VANCOMYCIN HCL IN DEXTROSE 1-5 GM/200ML-% IV SOLN
1000.0000 mg | Freq: Once | INTRAVENOUS | Status: AC
  Administered 2017-08-13: 1000 mg via INTRAVENOUS
  Filled 2017-08-13: qty 200

## 2017-08-13 MED ORDER — FOSFOMYCIN TROMETHAMINE 3 G PO PACK
3.0000 g | PACK | Freq: Once | ORAL | Status: AC
  Administered 2017-08-13: 3 g via ORAL
  Filled 2017-08-13: qty 3

## 2017-08-13 MED ORDER — ACETAMINOPHEN 650 MG RE SUPP: 650.0000 mg | Freq: Four times a day (QID) | RECTAL | Status: DC | PRN

## 2017-08-13 MED ORDER — FENTANYL CITRATE (PF) 250 MCG/5ML IJ SOLN
INTRAMUSCULAR | Status: AC
  Filled 2017-08-13: qty 5

## 2017-08-13 MED ORDER — SIMVASTATIN 20 MG PO TABS
20.0000 mg | ORAL_TABLET | Freq: Every day | ORAL | Status: DC
  Administered 2017-08-14 – 2017-08-19 (×5): 20 mg via ORAL
  Filled 2017-08-13 (×6): qty 1

## 2017-08-13 MED ORDER — DEXAMETHASONE SODIUM PHOSPHATE 10 MG/ML IJ SOLN
4.0000 mg | Freq: Four times a day (QID) | INTRAMUSCULAR | Status: DC
  Administered 2017-08-13 – 2017-08-14 (×3): 4 mg via INTRAVENOUS
  Filled 2017-08-13 (×2): qty 1

## 2017-08-13 MED ORDER — SODIUM CHLORIDE 0.9% FLUSH
3.0000 mL | Freq: Two times a day (BID) | INTRAVENOUS | Status: DC
  Administered 2017-08-13 – 2017-08-20 (×8): 3 mL via INTRAVENOUS

## 2017-08-13 MED ORDER — ENOXAPARIN SODIUM 40 MG/0.4ML ~~LOC~~ SOLN: 40.0000 mg | SUBCUTANEOUS | Status: DC

## 2017-08-13 MED ORDER — ESCITALOPRAM OXALATE 10 MG PO TABS
5.0000 mg | ORAL_TABLET | Freq: Every day | ORAL | Status: DC
  Administered 2017-08-13 – 2017-08-20 (×8): 5 mg via ORAL
  Filled 2017-08-13 (×8): qty 1

## 2017-08-13 MED ORDER — OXYCODONE-ACETAMINOPHEN 5-325 MG PO TABS
1.0000 | ORAL_TABLET | ORAL | Status: DC | PRN
  Administered 2017-08-13: 1 via ORAL
  Filled 2017-08-13: qty 1

## 2017-08-13 MED ORDER — AMPICILLIN SODIUM 1 G IJ SOLR
1.0000 g | Freq: Four times a day (QID) | INTRAMUSCULAR | Status: DC
  Administered 2017-08-14: 11:00:00 via INTRAVENOUS
  Administered 2017-08-14 – 2017-08-16 (×8): 1 g via INTRAVENOUS
  Filled 2017-08-13 (×11): qty 1000

## 2017-08-13 MED ORDER — SODIUM CHLORIDE 0.9% FLUSH
3.0000 mL | INTRAVENOUS | Status: DC | PRN
  Administered 2017-08-19: 3 mL via INTRAVENOUS
  Filled 2017-08-13: qty 3

## 2017-08-13 MED ORDER — DEXAMETHASONE SODIUM PHOSPHATE 4 MG/ML IJ SOLN
INTRAMUSCULAR | Status: AC
  Filled 2017-08-13: qty 1

## 2017-08-13 MED ORDER — ASPIRIN EC 81 MG PO TBEC
81.0000 mg | DELAYED_RELEASE_TABLET | Freq: Every day | ORAL | Status: DC
  Administered 2017-08-14 – 2017-08-21 (×5): 81 mg via ORAL
  Filled 2017-08-13 (×7): qty 1

## 2017-08-13 MED ORDER — SODIUM CHLORIDE 0.9 % IV SOLN
250.0000 mL | INTRAVENOUS | Status: DC | PRN
  Administered 2017-08-14: 250 mL via INTRAVENOUS

## 2017-08-13 MED ORDER — HYDROMORPHONE HCL 1 MG/ML IJ SOLN
1.0000 mg | Freq: Once | INTRAMUSCULAR | Status: AC
  Administered 2017-08-13: 1 mg via INTRAVENOUS
  Filled 2017-08-13: qty 1

## 2017-08-13 MED ORDER — OMEGA-3-ACID ETHYL ESTERS 1 G PO CAPS
2.0000 g | ORAL_CAPSULE | Freq: Every day | ORAL | Status: DC
  Administered 2017-08-13 – 2017-08-21 (×5): 2 g via ORAL
  Filled 2017-08-13 (×8): qty 2

## 2017-08-13 MED ORDER — LORAZEPAM 2 MG/ML IJ SOLN
0.5000 mg | Freq: Once | INTRAMUSCULAR | Status: AC
  Administered 2017-08-13: 0.5 mg via INTRAVENOUS
  Filled 2017-08-13: qty 1

## 2017-08-13 MED ORDER — OXYCODONE-ACETAMINOPHEN 5-325 MG PO TABS
1.0000 | ORAL_TABLET | Freq: Four times a day (QID) | ORAL | Status: DC | PRN
  Administered 2017-08-13 – 2017-08-15 (×5): 1 via ORAL
  Filled 2017-08-13 (×5): qty 1

## 2017-08-13 MED ORDER — MORPHINE SULFATE (PF) 4 MG/ML IV SOLN
6.0000 mg | Freq: Once | INTRAVENOUS | Status: AC
  Administered 2017-08-13: 6 mg via INTRAVENOUS
  Filled 2017-08-13: qty 2

## 2017-08-13 MED ORDER — SODIUM CHLORIDE 0.9% FLUSH
3.0000 mL | Freq: Two times a day (BID) | INTRAVENOUS | Status: DC
  Administered 2017-08-15 – 2017-08-21 (×4): 3 mL via INTRAVENOUS

## 2017-08-13 MED ORDER — GADOBENATE DIMEGLUMINE 529 MG/ML IV SOLN
15.0000 mL | Freq: Once | INTRAVENOUS | Status: AC | PRN
  Administered 2017-08-13: 15 mL via INTRAVENOUS

## 2017-08-13 MED ORDER — EYE VITAMINS PO CAPS: 1.0000 | ORAL_CAPSULE | Freq: Every day | ORAL | Status: DC

## 2017-08-13 MED ORDER — VANCOMYCIN HCL IN DEXTROSE 1-5 GM/200ML-% IV SOLN: 1000.0000 mg | Freq: Two times a day (BID) | INTRAVENOUS | Status: DC

## 2017-08-13 MED ORDER — ONDANSETRON HCL 4 MG PO TABS: 4.0000 mg | ORAL_TABLET | Freq: Four times a day (QID) | ORAL | Status: DC | PRN

## 2017-08-13 MED ORDER — APIXABAN 5 MG PO TABS
5.0000 mg | ORAL_TABLET | Freq: Two times a day (BID) | ORAL | Status: DC
  Administered 2017-08-13 – 2017-08-21 (×16): 5 mg via ORAL
  Filled 2017-08-13 (×18): qty 1

## 2017-08-13 MED ORDER — PANTOPRAZOLE SODIUM 20 MG PO TBEC
20.0000 mg | DELAYED_RELEASE_TABLET | Freq: Every day | ORAL | Status: DC
  Administered 2017-08-14 – 2017-08-21 (×8): 20 mg via ORAL
  Filled 2017-08-13 (×9): qty 1

## 2017-08-13 MED ORDER — TAMSULOSIN HCL 0.4 MG PO CAPS
0.4000 mg | ORAL_CAPSULE | Freq: Every day | ORAL | Status: DC
  Administered 2017-08-14 – 2017-08-21 (×8): 0.4 mg via ORAL
  Filled 2017-08-13 (×8): qty 1

## 2017-08-13 NOTE — Anesthesia Postprocedure Evaluation (Signed)
Anesthesia Post Note  Patient: Christian Munoz  Procedure(s) Performed: MRI WITH ANESTHESIA (N/A )     Patient location during evaluation: PACU Anesthesia Type: General Level of consciousness: awake Pain management: pain level controlled Vital Signs Assessment: post-procedure vital signs reviewed and stable Respiratory status: spontaneous breathing, nonlabored ventilation, respiratory function stable and patient connected to nasal cannula oxygen Cardiovascular status: blood pressure returned to baseline and stable Postop Assessment: no apparent nausea or vomiting Anesthetic complications: no    Last Vitals:  Vitals:   08/13/17 1845 08/13/17 1846  BP: 125/66 118/85  Pulse: 88 87  Resp: 15 15  Temp:  36.7 C  SpO2: (!) 89% 93%    Last Pain:  Vitals:   08/13/17 1846  TempSrc:   PainSc: Asleep                 Kort Stettler

## 2017-08-13 NOTE — Transfer of Care (Signed)
Immediate Anesthesia Transfer of Care Note  Patient: Christian Munoz  Procedure(s) Performed: MRI WITH ANESTHESIA (N/A )  Patient Location: PACU  Anesthesia Type:General  Level of Consciousness: awake, drowsy and confused  Airway & Oxygen Therapy: Patient Spontanous Breathing and Patient connected to nasal cannula oxygen  Post-op Assessment: Report given to RN and Post -op Vital signs reviewed and stable  Post vital signs: Reviewed and stable  Last Vitals:  Vitals:   08/13/17 1400 08/13/17 1430  BP: 123/71 123/62  Pulse: 88 86  Resp:    Temp:    SpO2: 96% 92%    Last Pain:  Vitals:   08/13/17 1034  TempSrc:   PainSc: 10-Worst pain ever         Complications: No apparent anesthesia complications aside from postoperative confusion. RR even and unlabored. VSS.

## 2017-08-13 NOTE — Clinical Social Work Note (Signed)
Clinical Social Work Assessment  Patient Details  Name: Christian Munoz MRN: [id number] Date of Birth: 13-Dec-1934  Date of referral:  08/13/17               Reason for consult:  Facility Placement                Permission sought to share information with:  Family Supports Permission granted to share information::  Yes, Verbal Permission Granted  Name::     Dereck Engineering geologist  (pt's son. )  Agency::     Relationship::     Contact Information:     Housing/Transportation Living arrangements for the past 2 months:  Apartment (living alone. ) Source of Information:  Patient Patient Interpreter Needed:  None Criminal Activity/Legal Involvement Pertinent to Current Situation/Hospitalization:  No - Comment as needed Significant Relationships:  Adult Children Lives with:  Self Do you feel safe going back to the place where you live?  Yes Need for family participation in patient care:  Yes (Comment)  Care giving concerns:  CSW spoke with pt at bedside. During this time pt expressed no concerns to CSW.    Social Worker assessment / plan:  CSW spoke with pt at bedside. During this time CSW was informed that pt is from home alone. Pt has two children a son and daughter. Pt's daughter [name]) per pt is his POA. Pt is agreeable to SNF if needed, however CSW did explain the barriers and possibility that pt will have to pay out of pocket for services if not covered under insurance.   Employment status:  Retired Forensic scientist:  Medicare PT Recommendations:  Not assessed at this time Information / Referral to community resources:     Patient/Family's Response to care:  Pt appeared to be understanding and agreeable to plan of care at this time.   Patient/Family's Understanding of and Emotional Response to Diagnosis, Current Treatment, and Prognosis:  No further questions or concerns have been presented to CSW at this time.   Emotional Assessment Appearance:  Appears stated  age Attitude/Demeanor/Rapport:    Affect (typically observed):    Orientation:  Oriented to Self, Oriented to Place, Oriented to  Time, Oriented to Situation Alcohol / Substance use:  Not Applicable Psych involvement (Current and /or in the community):  No (Comment)  Discharge Needs  Concerns to be addressed:  Care Coordination Readmission within the last 30 days:  Yes Current discharge risk:  None Barriers to Discharge:  No Barriers Identified   Wetzel Bjornstad, [place] 08/13/2017, 7:46 AM

## 2017-08-13 NOTE — Anesthesia Procedure Notes (Signed)
Procedure Name: Intubation Date/Time: 08/13/2017 4:16 PM Performed by: Oletta Lamas Pre-anesthesia Checklist: Patient identified, Emergency Drugs available, Suction available and Patient being monitored Patient Re-evaluated:Patient Re-evaluated prior to induction Oxygen Delivery Method: Circle System Utilized Preoxygenation: Pre-oxygenation with 100% oxygen Induction Type: IV induction Ventilation: Mask ventilation without difficulty Laryngoscope Size: Mac and 4 Grade View: Grade I Tube type: Oral Tube size: 7.5 mm Number of attempts: 1 Airway Equipment and Method: Stylet Placement Confirmation: ETT inserted through vocal cords under direct vision,  positive ETCO2 and breath sounds checked- equal and bilateral Secured at: 22 cm Tube secured with: Tape Dental Injury: Teeth and Oropharynx as per pre-operative assessment

## 2017-08-13 NOTE — H&P (Signed)
History and Physical    Christian Munoz CWC:[id number] DOB: 1935-06-29 DOA: 08/12/2017  PCP: [name], NP  Patient coming from: Home  I have personally extensively reviewed patient's old medical records in [place]  Chief Complaint: Acute back pain, urinary symptoms and weakness of the right lower extremity over the past 2 weeks.  HPI: Christian Munoz is a 82 y.o. male with medical history significant of small cell lung cancer (treatment completed in June 2018), atrial fibrillation and pulmonary embolism 1 month ago currently on Eliquis, balance difficulties over the past 2 weeks with falling from a sitting position on 2 occasions at home comes in with calf pain right leg pain and severe back pain.  His right leg is more of a numbness and a weakness.  This is gotten worse over the past 2 weeks and was relieved by nothing and improved by nothing.  He has also had inability to walk due to the pain and weakness.  Over the past 2 weeks he has difficult developed worsening balance as well as urinary tract symptoms.  He has been treated several times with third and fourth generation cephalosporins for a urinary tract infection but the urinary tract infection has persisted.  Review of cultures reveals that he has enterococcus.  Enterococcus is usually not sensitive to a cephalosporin.  Due to the severity of pain MRI was attempted in the emergency department on 4 occasions over the past 18 hours despite medications and Ativan the patient was unable to lean backwards and lie flat for the MRI.  The Dilaudid has caused him confusion and hypotension and he has not had significant pain relief with it.  Given the history of small cell lung cancer, anticoagulation, extremity weakness back pain and urinary symptoms patient is being admitted into the hospital for further evaluation management stabilization.  ED Course: MRI attempt x4 unsuccessful, persistent pain despite IV pain medication,  confusion due to pain medication, decision made to admit.  Review of Systems:   Review of Systems  Constitutional: Negative for chills and fever.  HENT: Negative for ear pain, hearing loss and tinnitus.   Eyes: Negative for blurred vision and double vision.  Respiratory: Negative for cough, hemoptysis and sputum production.   Cardiovascular: Negative for chest pain, palpitations and orthopnea.  Gastrointestinal: Negative for heartburn, nausea and vomiting.  Genitourinary: Positive for frequency. Negative for dysuria and urgency.  Musculoskeletal: Positive for back pain.       Right lower extremity pain and numbness  Skin: Negative for itching and rash.  Neurological: Positive for sensory change (Numbness of right lower extremity) and weakness (Right lower extremity). Negative for dizziness, tremors and headaches.       Poor balance over the past 2 weeks  Endo/Heme/Allergies: Negative for environmental allergies. Does not bruise/bleed easily.  Psychiatric/Behavioral: Negative for depression, substance abuse and suicidal ideas.     Past Medical History:  Diagnosis Date  . Allergic rhinitis due to pollen   . Anginal pain ([place])    Past medical history " does not seem to be a problem at all now."  . Cancer associated pain 02/08/2017  . Coronary atherosclerosis of native coronary artery   . Depressive disorder, not elsewhere classified   . Dysuria 01/18/2017  . Encounter for antineoplastic chemotherapy 11/30/2016  . GERD (gastroesophageal reflux disease)   . Goals of care, counseling/discussion 11/30/2016  . Headache    PMH: Migraines  . Herpes genitalia   . Hypertrophy of prostate without urinary obstruction  and other lower urinary tract symptoms (LUTS)   . Lack of coordination   . Lumbago   . Nausea and vomiting 11/30/2016  . Osteoarthrosis, unspecified whether generalized or localized, unspecified site   . Other and unspecified hyperlipidemia   . Other malaise and fatigue   .  Pneumonia   . Reflux esophagitis   . Skin cancer   . Sleep apnea    does not wear CPAP  . Syncope and collapse   . Urinary frequency   . Vitamin D deficiency     Past Surgical History:  Procedure Laterality Date  . CARDIAC CATHETERIZATION  2000   exact date unclear, was done in [place], [place]. No PCI  . COLONOSCOPY    . EYE SURGERY    . IR GENERIC HISTORICAL  12/04/2016   IR US GUIDE VASC ACCESS RIGHT 12/04/2016 Greggory Keen, MD [facility]  . IR GENERIC HISTORICAL  12/04/2016   IR FLUORO GUIDE PORT INSERTION RIGHT 12/04/2016 Greggory Keen, MD [facility]  . poly vocal cords  1985  . SKIN CANCER EXCISION  2011  . TONSILLECTOMY    . VASECTOMY    . VIDEO BRONCHOSCOPY WITH ENDOBRONCHIAL ULTRASOUND N/A 11/20/2016   Procedure: VIDEO BRONCHOSCOPY WITH ENDOBRONCHIAL ULTRASOUND;  Surgeon: Collene Gobble, MD;  Location: [facility] [place];  Service: Thoracic;  Laterality: N/A;     reports that he quit smoking about a year ago. His smoking use included Cigarettes. He has a 30.00 pack-year smoking history. He has never used smokeless tobacco. He reports that he does not drink alcohol or use drugs.  Allergies  Allergen Reactions  . Chantix [Varenicline] Other (See Comments)    Makes patient suicidal  . Zoloft [Sertraline Hcl] Other (See Comments)    Makes patient suicidal    Family History  Problem Relation Age of Onset  . Heart disease Father   . Cancer Father   . Cancer Brother   . Cancer Brother      Prior to Admission medications   Medication Sig Start Date End Date Taking? Authorizing Provider  apixaban (ELIQUIS) 5 MG TABS tablet Take 5 mg by mouth 2 (two) times daily.   Yes [provider]  aspirin 81 MG tablet Take 81 mg by mouth daily. Take one tablet once a day   Yes [provider]  escitalopram (LEXAPRO) 5 MG tablet TAKE 1 TABLET(5 MG) BY MOUTH EVERY MORNING 06/15/17  Yes Rainey Pines, MD  fish oil-omega-3 fatty acids 1000 MG capsule Take 2 g by mouth daily.     Yes [provider]  gabapentin (NEURONTIN) 300 MG capsule Take 1 capsule (300 mg total) by mouth 3 (three) times daily. 04/08/17  Yes [name], NP  Glucosamine-Chondroitin (GLUCOSAMINE CHONDR COMPLEX PO) Take 1 tablet by mouth daily.   Yes [provider]  Glycerin-Polysorbate 80 (REFRESH DRY EYE THERAPY OP) Place 1 drop into both eyes as needed (dry eye).   Yes [provider]  Multiple Vitamins-Minerals (EYE VITAMINS) CAPS Take 1 capsule by mouth daily.   Yes [provider]  Multiple Vitamins-Minerals (MULTIVITAMIN WITH MINERALS) tablet Take 1 tablet by mouth daily.   Yes [provider]  oxyCODONE-acetaminophen (PERCOCET/ROXICET) 5-325 MG tablet Take 1 tablet by mouth every 6 (six) hours as needed for severe pain. 08/06/17  Yes Curcio, Roselie Awkward, NP  pantoprazole (PROTONIX) 20 MG tablet TAKE 1 TABLET(20 MG) BY MOUTH DAILY 06/09/17  Yes [name], NP  QUEtiapine (SEROQUEL) 25 MG tablet  Take 1 tablet (25 mg total) by mouth at bedtime. 06/15/17  Yes Rainey Pines, MD  simvastatin (ZOCOR) 20 MG tablet TAKE 1 TABLET(20 MG) BY MOUTH DAILY 03/11/17  Yes Estill Dooms, MD  carvedilol (COREG) 3.125 MG tablet Take 1 tablet (3.125 mg total) by mouth 2 (two) times daily with a meal. Patient not taking: Reported on 08/12/2017 07/15/17   Allie Bossier, MD  tamsulosin (FLOMAX) 0.4 MG CAPS capsule TAKE 1 CAPSULE(0.4 MG) BY MOUTH DAILY 06/09/17   [name], NP    Physical Exam: Vitals:   08/13/17 0915 08/13/17 1400 08/13/17 1430 08/13/17 1513  BP: 133/74 123/71 123/62   Pulse: 97 88 86   Resp:      Temp:      TempSrc:      SpO2: 95% 96% 92%   Weight:    72.6 kg (160 lb)  Height:    5\' 10"  (1.778 m)    Constitutional: NAD, calm, comfortable Vitals:   08/13/17 0915 08/13/17 1400 08/13/17 1430 08/13/17 1513  BP: 133/74 123/71 123/62   Pulse: 97 88 86   Resp:      Temp:      TempSrc:      SpO2: 95% 96% 92%   Weight:    72.6 kg  (160 lb)  Height:    5\' 10"  (1.778 m)   Physical Exam  Constitutional: He is oriented to person, place, and time and well-developed, well-nourished, and in no distress.  HENT:  Head: Normocephalic and atraumatic.  Mouth/Throat: No oropharyngeal exudate.  Eyes: Pupils are equal, round, and reactive to light. Conjunctivae and EOM are normal.  Neck: Normal range of motion. Neck supple. No tracheal deviation present. No thyromegaly present.  Cardiovascular: Normal rate, regular rhythm and normal heart sounds.   Pulmonary/Chest: Effort normal and breath sounds normal. No respiratory distress.  Abdominal: Soft. Bowel sounds are normal. He exhibits no distension.  Musculoskeletal: Normal range of motion. He exhibits no edema or deformity.  Neurological: He is alert and oriented to person, place, and time. No cranial nerve deficit. GCS score is 15.  Skin: Skin is warm and dry. No erythema.  Psychiatric: Affect and judgment normal.    Labs on Admission: I have personally reviewed following labs and imaging studies  CBC:  Recent Labs Lab 08/12/17 2129  WBC 7.2  NEUTROABS 6.0  HGB 9.2*  HCT 30.6*  MCV 84.3  PLT 161   Basic Metabolic Panel:  Recent Labs Lab 08/12/17 2129  NA 137  K 4.0  CL 101  CO2 25  GLUCOSE 86  BUN 19  CREATININE 1.03  CALCIUM 9.3   GFR: Estimated Creatinine Clearance: 58.7 mL/min (by C-G formula based on SCr of 1.03 mg/dL). Liver Function Tests: No results for input(s): AST, ALT, ALKPHOS, BILITOT, PROT, ALBUMIN in the last 168 hours. No results for input(s): LIPASE, AMYLASE in the last 168 hours. No results for input(s): AMMONIA in the last 168 hours. Coagulation Profile: No results for input(s): INR, PROTIME in the last 168 hours. Cardiac Enzymes: No results for input(s): CKTOTAL, CKMB, CKMBINDEX, TROPONINI in the last 168 hours. BNP (last 3 results) No results for input(s): PROBNP in the last 8760 hours. HbA1C: No results for input(s): HGBA1C in  the last 72 hours. CBG: No results for input(s): GLUCAP in the last 168 hours. Lipid Profile: No results for input(s): CHOL, HDL, LDLCALC, TRIG, CHOLHDL, LDLDIRECT in the last 72 hours. Thyroid Function Tests: No results for input(s): TSH, T4TOTAL,  FREET4, T3FREE, THYROIDAB in the last 72 hours. Anemia Panel: No results for input(s): VITAMINB12, FOLATE, FERRITIN, TIBC, IRON, RETICCTPCT in the last 72 hours. Urine analysis:    Component Value Date/Time   COLORURINE YELLOW 08/12/2017 1842   APPEARANCEUR CLOUDY (A) 08/12/2017 1842   LABSPEC 1.011 08/12/2017 1842   LABSPEC 1.005 01/12/2017 1239   PHURINE 7.0 08/12/2017 1842   GLUCOSEU NEGATIVE 08/12/2017 1842   GLUCOSEU Negative 01/12/2017 1239   HGBUR NEGATIVE 08/12/2017 1842   BILIRUBINUR NEGATIVE 08/12/2017 1842   BILIRUBINUR neg 07/21/2017 1542   BILIRUBINUR Negative 01/12/2017 1239   KETONESUR 5 (A) 08/12/2017 1842   PROTEINUR 30 (A) 08/12/2017 1842   UROBILINOGEN 0.2 07/21/2017 1542   UROBILINOGEN 0.2 01/12/2017 1239   NITRITE NEGATIVE 08/12/2017 1842   LEUKOCYTESUR LARGE (A) 08/12/2017 1842   LEUKOCYTESUR Negative 01/12/2017 1239    Radiological Exams on Admission: Dg Chest 2 View  Result Date: 08/12/2017 CLINICAL DATA:  Right-sided pain after fall from bed. History of pneumonia. Former smoker. EXAM: CHEST  2 VIEW COMPARISON:  07/13/2017 chest CT FINDINGS: Top-normal size heart with aortic atherosclerosis. Port catheter is noted with tip in the distal SVC. Mild hyperinflation of the upper lobes with crowding of interstitial lung markings in the lower lobes. Streaky parenchymal opacities at the left base some which likely reflect chronic post radiation change and/or atelectasis. Superimposed pneumonia would be difficult to entirely exclude but based on history of fall, believed less likely. No acute nor suspicious osseous abnormalities. No pneumothorax. IMPRESSION: 1. Aortic atherosclerosis. 2. Left basilar atelectasis, scarring  and/or post radiated change. Superimposed subtle pneumonia is not entirely excluded but based on clinical history, believed less likely. 3. No acute fracture or pneumothorax. Electronically Signed   By: Ashley Royalty M.D.   On: 08/12/2017 23:41   Dg Lumbar Spine Complete  Result Date: 08/12/2017 CLINICAL DATA:  Pain after fall EXAM: LUMBAR SPINE - COMPLETE 4+ VIEW COMPARISON:  CXR 10/23/2016 FINDINGS: Lumbosacral transitional vertebral body. S1-S2 disc noted based on 12 ribbed thoracic vertebral bodies on the chest radiograph. The lowest square vertebral body will be labeled S1. No acute compression fracture. There is moderate disc space narrowing L4-5, L5-S1 and S1-S2 with associated degenerative facet arthropathy. Minimal grade 1 anterolisthesis of L5 on S1. Aortoiliac atherosclerosis noted without definite aneurysm. IMPRESSION: 1. Transitional lumbosacral vertebral body. 2. Lower lumbar degenerative disc and facet arthropathy L4 through S2. 3. No acute osseous abnormality. 4. Minimal grade 1 anterolisthesis of L5 on S1. Electronically Signed   By: Ashley Royalty M.D.   On: 08/12/2017 23:38   Ct Head Wo Contrast  Result Date: 08/12/2017 CLINICAL DATA:  Fall while on anti coagulation therapy. EXAM: CT HEAD WITHOUT CONTRAST TECHNIQUE: Contiguous axial images were obtained from the base of the skull through the vertex without intravenous contrast. COMPARISON:  Brain MRI 06/04/2017 FINDINGS: Brain: No mass lesion, intraparenchymal hemorrhage or extra-axial collection. No evidence of acute cortical infarct. There is periventricular hypoattenuation compatible with chronic microvascular disease. Mild atrophy Vascular: No hyperdense vessel or unexpected calcification. Skull: Normal visualized skull base, calvarium and extracranial soft tissues. Sinuses/Orbits: No sinus fluid levels or advanced mucosal thickening. No mastoid effusion. Normal orbits. IMPRESSION: Mild atrophy and chronic microvascular disease without  acute abnormality. Electronically Signed   By: Ulyses Jarred M.D.   On: 08/12/2017 23:29   Dg Foot Complete Right  Result Date: 08/12/2017 CLINICAL DATA:  Bruising of the right toes after fall today. EXAM: RIGHT FOOT COMPLETE - 3+ VIEW COMPARISON:  None. FINDINGS: Hallux rigidus of the great toe with marked joint space narrowing and spurring of the first MTP. No acute fracture or malalignment is noted. There is dorsal soft tissue swelling of the forefoot. There is a tiny plantar calcaneal enthesophyte. IMPRESSION: 1. Hallux rigidus of the first MTP with marked joint space narrowing and spurring. 2. No acute osseous abnormality. 3. Dorsal soft tissue swelling over the forefoot. 4. Small plantar calcaneal enthesophyte. Electronically Signed   By: Ashley Royalty M.D.   On: 08/12/2017 23:32   Dg Hip Unilat With Pelvis 1v Left  Result Date: 08/12/2017 CLINICAL DATA:  Loss of balance and fell off bed.  Left hip pain. EXAM: DG HIP (WITH OR WITHOUT PELVIS) 1V*L* COMPARISON:  None. FINDINGS: There is no evidence of hip fracture or dislocation. There is degenerative disc space narrowing at L4-5. L5-S1 degenerative facet hypertrophy and sclerosis. No suspicious osseous lesions of the bony pelvis and hips. Intact sacroiliac joints and pubic symphysis. IMPRESSION: No acute fracture or malalignment of either hip. There is lower lumbar facet arthropathy and disc disease. Electronically Signed   By: Ashley Royalty M.D.   On: 08/12/2017 23:30    EKG: Independently reviewed.  Sinus rhythm with left atrial enlargement no significant change when compared to previous.  Assessment/Plan Principal Problem:   Leg weakness Active Problems:   Acute lower UTI   Acute back pain less than 4 weeks duration   History of fall   Atrial fibrillation, chronic (HCC)   Small cell lung cancer in adult [facility])   Hyperlipidemia   Hypertension   Benign prostatic hyperplasia with urinary obstruction   Goals of care, counseling/discussion      1.  Leg weakness: Patient with a history of small cell lung cancer who completed treatment in June 2018.  Patient was told that his disease was under control but that he has residual disease.  The constellation of urinary symptoms acute back pain and leg weakness this is highly suspicious for metastatic lesion in the spine.  MRI was attempted x4 in the emergency department but unable to be completed due to significant pain.  Patient is being admitted for MRI with anesthesia.  I have personally called anesthesia and MRI and discussed it with them.  Plan is for MRI today at approximately 3:30 PM.  2.  Acute lower urinary tract infection: Patient has had an infection in his urinary tract for multiple weeks has undergone multiple antibiotics but unfortunately none of them were antibiotics to which his infection is sensitive.  Urine culture from the 10th revealed enterococcus sensitive to ampicillin and vancomycin.  Given the patient has been on multiple antibiotics we will try ampicillin first awaiting culture data.  He likely has a persistent enterococcus infection as he has not been treated with adequate antibiotics.  3.  Acute back pain less than 4 weeks duration: This is very concerning for metastatic disease.  We will obtain an MRI as described above.  4.  Falls in the past 2 weeks: This is related to his leg weakness and his acute back pain.  MRI is pending.  Of concern also is the feeling of being off balance which he describes when he falls.  Will have physical therapy evaluate patient.  5.  Chronic atrial fibrillation: Patient takes Eliquis daily.  Will hold this today as all have his meds on hold due to planned anesthesia for MRI.  6.  Small cell lung cancer in adult: Patient received treatment and completed therapy in  June 2018.  He had some persistent disease but appeared to be under control.  Will check MRI to determine if there is a metastases associated with his cancer.  7.   Hyperlipidemia: Continue home medications.  8.  Hypertension: Continue home regimen.  9.  Benign prostatic hyperplasia with urinary obstruction: Likely cause of patient's recurrent urinary tract infection in addition to requiring complex antibiotic management due to having enterococcus.  10.  Goals of care counseling and discussion: Patient states he wishes to be a DNR.  He is interested in finding out more about hospice care and would like to know if they would be able to help him control his pain if it turns out that his pain is related to cancer.  He would like an oncological opinion regarding further treatment should this turn out to be metastases.  If it is not metastases he is still interested in receiving palliative care for the symptoms.  DVT prophylaxis: On Eliquis Code Status: DNR Family Communication: Spoke extensively with the patient's son Christian Munoz who was present for the entire examination. Disposition Plan: may require skilled nursing facility given his inability to live alone at the present time Consults called: Palliative care Admission status: Observation   Lady Deutscher MD FACP [facility] Pager [phone]  If 7PM-7AM, please contact night-coverage [email or web address] Password [facility]  08/13/2017, 3:58 PM

## 2017-08-13 NOTE — Anesthesia Preprocedure Evaluation (Signed)
Anesthesia Evaluation  Patient identified by MRN, date of birth, ID band Patient awake    Reviewed: Allergy & Precautions, H&P , NPO status , Patient's Chart, lab work & pertinent test results  Airway Mallampati: II   Neck ROM: full    Dental   Pulmonary sleep apnea , former smoker,    breath sounds clear to auscultation       Cardiovascular hypertension, + angina + CAD   Rhythm:regular Rate:Normal     Neuro/Psych  Headaches, PSYCHIATRIC DISORDERS Depression    GI/Hepatic GERD  ,  Endo/Other    Renal/GU      Musculoskeletal  (+) Arthritis ,   Abdominal   Peds  Hematology  (+) anemia ,   Anesthesia Other Findings   Reproductive/Obstetrics                             Anesthesia Physical Anesthesia Plan  ASA: III  Anesthesia Plan: General   Post-op Pain Management:    Induction: Intravenous  PONV Risk Score and Plan: 2 and Ondansetron, Dexamethasone and Treatment may vary due to age or medical condition  Airway Management Planned: Oral ETT  Additional Equipment:   Intra-op Plan:   Post-operative Plan: Extubation in OR  Informed Consent: I have reviewed the patients History and Physical, chart, labs and discussed the procedure including the risks, benefits and alternatives for the proposed anesthesia with the patient or authorized representative who has indicated his/her understanding and acceptance.     Plan Discussed with: CRNA, Anesthesiologist and Surgeon  Anesthesia Plan Comments:         Anesthesia Quick Evaluation

## 2017-08-13 NOTE — ED Provider Notes (Signed)
Received sign out at beginning of shift.  Pt here with significant back pain and radicular R leg pain.  He is currently waiting for thoracic and lumbar MRI.  He will need social work to help with placement due to living at home by himself and unable to care for himself due to his back pain.    Pt also has a lingering UTI not improved despite being on multiple abx recently.  He does have f/u with urologist but cancel his appointment yesterday due to pain.  Urine culture sent, a dose of fosfomycin given in the ER.    9:59 AM Pt went back to the MRI unit but unable to tolerates the procedure.  Over night he became confused and hypotensive with the administration of opiate.  He still endorse pain to R posterior back and R leg.  Has hx of small cell lung cancer, recent diagnosis of afib as well as PE currently on Eliquis.    Appreciate consultation from Internal Medicine Resident who agrees to see pt in the ER and will admit for pain control and to obtain appropriate imaging.    10:55 AM Internal Medicine resident recommend [facility] admission.  Appreciate consultation from Dr. Evangeline Gula who agrees to see and admit pt.  She also request pt to be treated for his UTI with Vancomycin.    BP 133/74   Pulse 97   Temp 98.9 F (37.2 C) (Oral)   Resp 16   SpO2 95%   Results for orders placed or performed during the hospital encounter of 08/12/17  Urinalysis, Routine w reflex microscopic  Result Value Ref Range   Color, Urine YELLOW YELLOW   APPearance CLOUDY (A) CLEAR   Specific Gravity, Urine 1.011 1.005 - 1.030   pH 7.0 5.0 - 8.0   Glucose, UA NEGATIVE NEGATIVE mg/dL   Hgb urine dipstick NEGATIVE NEGATIVE   Bilirubin Urine NEGATIVE NEGATIVE   Ketones, ur 5 (A) NEGATIVE mg/dL   Protein, ur 30 (A) NEGATIVE mg/dL   Nitrite NEGATIVE NEGATIVE   Leukocytes, UA LARGE (A) NEGATIVE   RBC / HPF 6-30 0 - 5 RBC/hpf   WBC, UA TOO NUMEROUS TO COUNT 0 - 5 WBC/hpf   Bacteria, UA FEW (A) NONE SEEN    Squamous Epithelial / LPF NONE SEEN NONE SEEN   WBC Clumps PRESENT   CBC with Differential  Result Value Ref Range   WBC 7.2 4.0 - 10.5 K/uL   RBC 3.63 (L) 4.22 - 5.81 MIL/uL   Hemoglobin 9.2 (L) 13.0 - 17.0 g/dL   HCT 30.6 (L) 39.0 - 52.0 %   MCV 84.3 78.0 - 100.0 fL   MCH 25.3 (L) 26.0 - 34.0 pg   MCHC 30.1 30.0 - 36.0 g/dL   RDW 19.3 (H) 11.5 - 15.5 %   Platelets 192 150 - 400 K/uL   Neutrophils Relative % 82 %   Neutro Abs 6.0 1.7 - 7.7 K/uL   Lymphocytes Relative 12 %   Lymphs Abs 0.9 0.7 - 4.0 K/uL   Monocytes Relative 5 %   Monocytes Absolute 0.4 0.1 - 1.0 K/uL   Eosinophils Relative 1 %   Eosinophils Absolute 0.0 0.0 - 0.7 K/uL   Basophils Relative 0 %   Basophils Absolute 0.0 0.0 - 0.1 K/uL  Basic metabolic panel  Result Value Ref Range   Sodium 137 135 - 145 mmol/L   Potassium 4.0 3.5 - 5.1 mmol/L   Chloride 101 101 - 111 mmol/L   CO2  25 22 - 32 mmol/L   Glucose, Bld 86 65 - 99 mg/dL   BUN 19 6 - 20 mg/dL   Creatinine, Ser 1.03 0.61 - 1.24 mg/dL   Calcium 9.3 8.9 - 10.3 mg/dL   GFR calc non Af Amer >60 >60 mL/min   GFR calc Af Amer >60 >60 mL/min   Anion gap 11 5 - 15   Dg Chest 2 View  Result Date: 08/12/2017 CLINICAL DATA:  Right-sided pain after fall from bed. History of pneumonia. Former smoker. EXAM: CHEST  2 VIEW COMPARISON:  07/13/2017 chest CT FINDINGS: Top-normal size heart with aortic atherosclerosis. Port catheter is noted with tip in the distal SVC. Mild hyperinflation of the upper lobes with crowding of interstitial lung markings in the lower lobes. Streaky parenchymal opacities at the left base some which likely reflect chronic post radiation change and/or atelectasis. Superimposed pneumonia would be difficult to entirely exclude but based on history of fall, believed less likely. No acute nor suspicious osseous abnormalities. No pneumothorax. IMPRESSION: 1. Aortic atherosclerosis. 2. Left basilar atelectasis, scarring and/or post radiated change.  Superimposed subtle pneumonia is not entirely excluded but based on clinical history, believed less likely. 3. No acute fracture or pneumothorax. Electronically Signed   By: Ashley Royalty M.D.   On: 08/12/2017 23:41   Dg Lumbar Spine Complete  Result Date: 08/12/2017 CLINICAL DATA:  Pain after fall EXAM: LUMBAR SPINE - COMPLETE 4+ VIEW COMPARISON:  CXR 10/23/2016 FINDINGS: Lumbosacral transitional vertebral body. S1-S2 disc noted based on 12 ribbed thoracic vertebral bodies on the chest radiograph. The lowest square vertebral body will be labeled S1. No acute compression fracture. There is moderate disc space narrowing L4-5, L5-S1 and S1-S2 with associated degenerative facet arthropathy. Minimal grade 1 anterolisthesis of L5 on S1. Aortoiliac atherosclerosis noted without definite aneurysm. IMPRESSION: 1. Transitional lumbosacral vertebral body. 2. Lower lumbar degenerative disc and facet arthropathy L4 through S2. 3. No acute osseous abnormality. 4. Minimal grade 1 anterolisthesis of L5 on S1. Electronically Signed   By: Ashley Royalty M.D.   On: 08/12/2017 23:38   Ct Head Wo Contrast  Result Date: 08/12/2017 CLINICAL DATA:  Fall while on anti coagulation therapy. EXAM: CT HEAD WITHOUT CONTRAST TECHNIQUE: Contiguous axial images were obtained from the base of the skull through the vertex without intravenous contrast. COMPARISON:  Brain MRI 06/04/2017 FINDINGS: Brain: No mass lesion, intraparenchymal hemorrhage or extra-axial collection. No evidence of acute cortical infarct. There is periventricular hypoattenuation compatible with chronic microvascular disease. Mild atrophy Vascular: No hyperdense vessel or unexpected calcification. Skull: Normal visualized skull base, calvarium and extracranial soft tissues. Sinuses/Orbits: No sinus fluid levels or advanced mucosal thickening. No mastoid effusion. Normal orbits. IMPRESSION: Mild atrophy and chronic microvascular disease without acute abnormality. Electronically  Signed   By: Ulyses Jarred M.D.   On: 08/12/2017 23:29   Dg Foot Complete Right  Result Date: 08/12/2017 CLINICAL DATA:  Bruising of the right toes after fall today. EXAM: RIGHT FOOT COMPLETE - 3+ VIEW COMPARISON:  None. FINDINGS: Hallux rigidus of the great toe with marked joint space narrowing and spurring of the first MTP. No acute fracture or malalignment is noted. There is dorsal soft tissue swelling of the forefoot. There is a tiny plantar calcaneal enthesophyte. IMPRESSION: 1. Hallux rigidus of the first MTP with marked joint space narrowing and spurring. 2. No acute osseous abnormality. 3. Dorsal soft tissue swelling over the forefoot. 4. Small plantar calcaneal enthesophyte. Electronically Signed   By: Ashley Royalty  M.D.   On: 08/12/2017 23:32   Dg Hip Unilat With Pelvis 1v Left  Result Date: 08/12/2017 CLINICAL DATA:  Loss of balance and fell off bed.  Left hip pain. EXAM: DG HIP (WITH OR WITHOUT PELVIS) 1V*L* COMPARISON:  None. FINDINGS: There is no evidence of hip fracture or dislocation. There is degenerative disc space narrowing at L4-5. L5-S1 degenerative facet hypertrophy and sclerosis. No suspicious osseous lesions of the bony pelvis and hips. Intact sacroiliac joints and pubic symphysis. IMPRESSION: No acute fracture or malalignment of either hip. There is lower lumbar facet arthropathy and disc disease. Electronically Signed   By: Ashley Royalty M.D.   On: 08/12/2017 23:30      Domenic Moras, PA-C 08/13/17 1002    Domenic Moras, PA-C 08/13/17 1056    Ripley Fraise, MD 08/13/17 [id number]

## 2017-08-13 NOTE — ED Notes (Signed)
Family requested to speak with SW. SW notified of request.

## 2017-08-13 NOTE — ED Notes (Signed)
Pt out of bed attempting to stand to urinate.  Explained to pt he needs to stay in the bed for safety and call for assistance. Pt is somnolent and confused to situation.  Assisted with urinal and placed back into bed.

## 2017-08-13 NOTE — ED Notes (Signed)
Admitting paged for new bed assignment due to medication restriction on current accepting unit.

## 2017-08-13 NOTE — Discharge Planning (Signed)
EDCM spoke with pt daughter via telephone.  Informed daughter of limitations of HH vs ALF vs SNF.  Pt daughter would like to explore ALF option more and if it is not a fit- HH.  EDCM will continue to follow for disposition needs.

## 2017-08-13 NOTE — ED Notes (Signed)
Pt out of bed and attempting to urinate. Pt assisted with urinal and back to bed. Easily redirectable.

## 2017-08-13 NOTE — ED Notes (Signed)
Pt to bathroom via wheelchair.

## 2017-08-13 NOTE — ED Notes (Signed)
Family Medicine MD contacted Cardiology for new plan of care. Cardiology to advise further after pt consult.

## 2017-08-13 NOTE — Progress Notes (Signed)
CSW spoke with pt's daughter [name] and son Dereck after receivng permission from pt. CSW was informed that families preference is for pt to be back at home with assistance [facility]) as they think that this is what would make pt more comfortable. CSW explained to both children the barriers in placing pt from the ED. RN CM expressed to family that ALF may be option but pt would still have to pay out of pocket for ALF. CSW to update family as needed.    Virgie Dad Irfan Veal, MSW, [facility] Clinical Social Worker [phone]

## 2017-08-14 DIAGNOSIS — R29898 Other symptoms and signs involving the musculoskeletal system: Secondary | ICD-10-CM | POA: Diagnosis not present

## 2017-08-14 DIAGNOSIS — N138 Other obstructive and reflux uropathy: Secondary | ICD-10-CM | POA: Diagnosis present

## 2017-08-14 DIAGNOSIS — Z7189 Other specified counseling: Secondary | ICD-10-CM

## 2017-08-14 DIAGNOSIS — Z66 Do not resuscitate: Secondary | ICD-10-CM | POA: Diagnosis present

## 2017-08-14 DIAGNOSIS — C7951 Secondary malignant neoplasm of bone: Secondary | ICD-10-CM | POA: Diagnosis present

## 2017-08-14 DIAGNOSIS — B952 Enterococcus as the cause of diseases classified elsewhere: Secondary | ICD-10-CM | POA: Diagnosis present

## 2017-08-14 DIAGNOSIS — I482 Chronic atrial fibrillation: Secondary | ICD-10-CM | POA: Diagnosis present

## 2017-08-14 DIAGNOSIS — Z86711 Personal history of pulmonary embolism: Secondary | ICD-10-CM | POA: Diagnosis not present

## 2017-08-14 DIAGNOSIS — K649 Unspecified hemorrhoids: Secondary | ICD-10-CM | POA: Diagnosis present

## 2017-08-14 DIAGNOSIS — M5410 Radiculopathy, site unspecified: Secondary | ICD-10-CM

## 2017-08-14 DIAGNOSIS — E785 Hyperlipidemia, unspecified: Secondary | ICD-10-CM | POA: Diagnosis present

## 2017-08-14 DIAGNOSIS — Z923 Personal history of irradiation: Secondary | ICD-10-CM | POA: Diagnosis not present

## 2017-08-14 DIAGNOSIS — N390 Urinary tract infection, site not specified: Secondary | ICD-10-CM | POA: Diagnosis present

## 2017-08-14 DIAGNOSIS — M5116 Intervertebral disc disorders with radiculopathy, lumbar region: Secondary | ICD-10-CM | POA: Diagnosis present

## 2017-08-14 DIAGNOSIS — Z9221 Personal history of antineoplastic chemotherapy: Secondary | ICD-10-CM | POA: Diagnosis not present

## 2017-08-14 DIAGNOSIS — G893 Neoplasm related pain (acute) (chronic): Secondary | ICD-10-CM | POA: Diagnosis not present

## 2017-08-14 DIAGNOSIS — Z515 Encounter for palliative care: Secondary | ICD-10-CM

## 2017-08-14 DIAGNOSIS — M549 Dorsalgia, unspecified: Secondary | ICD-10-CM | POA: Diagnosis not present

## 2017-08-14 DIAGNOSIS — N401 Enlarged prostate with lower urinary tract symptoms: Secondary | ICD-10-CM | POA: Diagnosis present

## 2017-08-14 DIAGNOSIS — C3490 Malignant neoplasm of unspecified part of unspecified bronchus or lung: Secondary | ICD-10-CM | POA: Diagnosis present

## 2017-08-14 DIAGNOSIS — I10 Essential (primary) hypertension: Secondary | ICD-10-CM | POA: Diagnosis present

## 2017-08-14 DIAGNOSIS — M48061 Spinal stenosis, lumbar region without neurogenic claudication: Secondary | ICD-10-CM | POA: Diagnosis present

## 2017-08-14 DIAGNOSIS — K219 Gastro-esophageal reflux disease without esophagitis: Secondary | ICD-10-CM | POA: Diagnosis present

## 2017-08-14 DIAGNOSIS — M5127 Other intervertebral disc displacement, lumbosacral region: Secondary | ICD-10-CM | POA: Diagnosis present

## 2017-08-14 DIAGNOSIS — F329 Major depressive disorder, single episode, unspecified: Secondary | ICD-10-CM | POA: Diagnosis not present

## 2017-08-14 DIAGNOSIS — R45851 Suicidal ideations: Secondary | ICD-10-CM | POA: Diagnosis present

## 2017-08-14 DIAGNOSIS — C799 Secondary malignant neoplasm of unspecified site: Secondary | ICD-10-CM | POA: Diagnosis not present

## 2017-08-14 DIAGNOSIS — W19XXXA Unspecified fall, initial encounter: Secondary | ICD-10-CM

## 2017-08-14 DIAGNOSIS — I2510 Atherosclerotic heart disease of native coronary artery without angina pectoris: Secondary | ICD-10-CM | POA: Diagnosis present

## 2017-08-14 DIAGNOSIS — C7949 Secondary malignant neoplasm of other parts of nervous system: Secondary | ICD-10-CM | POA: Diagnosis present

## 2017-08-14 LAB — BASIC METABOLIC PANEL
Anion gap: 13 (ref 5–15)
BUN: 20 mg/dL (ref 6–20)
CALCIUM: 8.8 mg/dL — AB (ref 8.9–10.3)
CO2: 24 mmol/L (ref 22–32)
Chloride: 99 mmol/L — ABNORMAL LOW (ref 101–111)
Creatinine, Ser: 1.09 mg/dL (ref 0.61–1.24)
GFR calc Af Amer: >60 mL/min (ref >60)
GLUCOSE: 130 mg/dL — AB (ref 65–99)
Potassium: 4.3 mmol/L (ref 3.5–5.1)
Sodium: 136 mmol/L (ref 135–145)

## 2017-08-14 LAB — CBC
HCT: 28.7 % — ABNORMAL LOW (ref 39.0–52.0)
Hemoglobin: 8.6 g/dL — ABNORMAL LOW (ref 13.0–17.0)
MCH: 25.2 pg — AB (ref 26.0–34.0)
MCHC: 30 g/dL (ref 30.0–36.0)
MCV: 84.2 fL (ref 78.0–100.0)
PLATELETS: 155 10*3/uL (ref 150–400)
RBC: 3.41 MIL/uL — AB (ref 4.22–5.81)
RDW: 18.9 % — ABNORMAL HIGH (ref 11.5–15.5)
WBC: 7.4 10*3/uL (ref 4.0–10.5)

## 2017-08-14 MED ORDER — LIDOCAINE 5 % EX PTCH
1.0000 | MEDICATED_PATCH | CUTANEOUS | Status: DC
  Administered 2017-08-14 – 2017-08-21 (×7): 1 via TRANSDERMAL
  Filled 2017-08-14 (×8): qty 1

## 2017-08-14 MED ORDER — DEXAMETHASONE SODIUM PHOSPHATE 10 MG/ML IJ SOLN
4.0000 mg | Freq: Two times a day (BID) | INTRAMUSCULAR | Status: DC
  Administered 2017-08-14 – 2017-08-20 (×12): 4 mg via INTRAVENOUS
  Filled 2017-08-14 (×12): qty 1

## 2017-08-14 NOTE — Progress Notes (Signed)
PROGRESS NOTE    Christian Munoz  NUU:[id number] DOB: Jun 03, 1935 DOA: 08/12/2017 PCP: [name], NP  Brief Narrative: Christian Munoz is a 82 y.o. male with medical history significant of Stage 3A small cell lung cancer completed concurrent chemotherapy/radiation in June 2018), atrial fibrillation and pulmonary embolism 1 month ago currently on Eliquis, balance difficulties over the past 2 weeks with falling from a sitting position on 2 occasions at home comes in with calf pain right leg pain and severe back pain.  His right leg is more of a numbness and a weakness.  This is gotten worse over the past 2 weeks and was relieved by nothing, he was admitted for further workup. Finally MRI after multiple attempts was completed under anesthesia last evening, noted extensive extraosseous epidural tumor extending through the tobacco lumbar spine, severe central canal stenosis at some levels, and foraminal stenosis at others, no evidence of spinal cord compression noted on MRI  Assessment & Plan:   1. Back pain/leg pain and leg weakness  -secondary to extensive thoracolumbar extraosseous epidural tumor, no spinal cord compression -Continue IV Decadron 4 mg every 12 for symptom relief -Patient with known stage IIIa small cell lung cancer status post concurrent chemoradiation in June 2 0 18 subsequently under observation per Dr. Julien Nordmann, now stage IV disease based on above MRI findings -Radiation oncology consult requested to evaluate options -Pain control-gabapentin, oxycodone and lidocaine patch, physical therapy evaluation -Palliative consult requested yesterday per patient request, consult appreciated  2.  Acute lower urinary tract infection:  Recently completed multiple antibiotic courses, urine culture from 10/10 with enterococcus sensitive to ampicillin and vancomycin  - continue IV ampicillin, await urine culture from yesterday   3. Falls in the past 2 weeks:  -Due to problem #1   -Await physical therapy evaluation   4.  Chronic atrial fibrillation: -  resumed Eliquis - in sinus rhythm, rate controlled at this time   7.stage IV non-small cell lung cancer  - previously was stage IIIa status post chemoradiation in June 2 018  - now metastatic with above MRI findings  - radiation oncology consult, will notify Dr. Julien Nordmann on Monday  - palliative consulted yesterday per patient request   7.  Hyperlipidemia: Continue home medications.  8.  Hypertension: Continue home regimen.  9.  Benign prostatic hyperplasia with urinary obstruction: Likely cause of patient's recurrent urinary tract infection in addition to requiring complex antibiotic management due to having enterococcus.  10.  Goals of care counseling and discussion: -DO NOT RESUSCITATE, palliative following   DVT prophylaxis: On Eliquis Code Status: DNR. Family Communication: son Vicente Males at bedside Disposition Plan: may need SNF  Consultants:   Palliative care  Radiation oncology/Dr. Lisbeth Renshaw   Procedures:   Antimicrobials:    Subjective: -Pain a little better controlled, no other complaints at this time   Objective: Vitals:   08/13/17 1846 08/13/17 2036 08/14/17 0433 08/14/17 1240  BP: 118/85 123/69 113/67 130/65  Pulse: 87 82 90 88  Resp: 15 17 18 17   Temp: 98 F (36.7 C) 98.2 F (36.8 C) 97.9 F (36.6 C) 98.4 F (36.9 C)  TempSrc:   Oral Oral  SpO2: 93% 98% 93% 92%  Weight:      Height:        Intake/Output Summary (Last 24 hours) at 08/14/17 1330 Last data filed at 08/14/17 1240  Gross per 24 hour  Intake              650 ml  Output              851 ml  Net             -201 ml   Filed Weights   08/13/17 1513  Weight: 72.6 kg (160 lb)    Examination:  General exam: Appears calm and comfortable,  thinly built male  Respiratory system: Clear to auscultation. Respiratory effort normal. Cardiovascular system: S1 & S2 heard, RRR. No JVD, murmurs, rubs, gallops    Gastrointestinal system: Abdomen is nondistended, soft and nontender.Normal bowel sounds heard. Central nervous system: Alert and oriented. right lower extremity is 4 out of 5 strength, left lower extremity is 5 out of 5 slightly increased hyperesthesia and right high and lower leg, upper extremities and cranial nerves unremarkable ExtremitieNo edema skin: No rashes, lesions or ulcers Psychiatry: Judgement and insight appear normal. Mood & affect appropriate.     Data Reviewed:   CBC:  Recent Labs Lab 08/12/17 2129 08/14/17 0631  WBC 7.2 7.4  NEUTROABS 6.0  --   HGB 9.2* 8.6*  HCT 30.6* 28.7*  MCV 84.3 84.2  PLT 192 992   Basic Metabolic Panel:  Recent Labs Lab 08/12/17 2129 08/14/17 0631  NA 137 136  K 4.0 4.3  CL 101 99*  CO2 25 24  GLUCOSE 86 130*  BUN 19 20  CREATININE 1.03 1.09  CALCIUM 9.3 8.8*   GFR: Estimated Creatinine Clearance: 55.5 mL/min (by C-G formula based on SCr of 1.09 mg/dL). Liver Function Tests: No results for input(s): AST, ALT, ALKPHOS, BILITOT, PROT, ALBUMIN in the last 168 hours. No results for input(s): LIPASE, AMYLASE in the last 168 hours. No results for input(s): AMMONIA in the last 168 hours. Coagulation Profile: No results for input(s): INR, PROTIME in the last 168 hours. Cardiac Enzymes: No results for input(s): CKTOTAL, CKMB, CKMBINDEX, TROPONINI in the last 168 hours. BNP (last 3 results) No results for input(s): PROBNP in the last 8760 hours. HbA1C: No results for input(s): HGBA1C in the last 72 hours. CBG: No results for input(s): GLUCAP in the last 168 hours. Lipid Profile: No results for input(s): CHOL, HDL, LDLCALC, TRIG, CHOLHDL, LDLDIRECT in the last 72 hours. Thyroid Function Tests: No results for input(s): TSH, T4TOTAL, FREET4, T3FREE, THYROIDAB in the last 72 hours. Anemia Panel: No results for input(s): VITAMINB12, FOLATE, FERRITIN, TIBC, IRON, RETICCTPCT in the last 72 hours. Urine analysis:    Component  Value Date/Time   COLORURINE YELLOW 08/12/2017 1842   APPEARANCEUR CLOUDY (A) 08/12/2017 1842   LABSPEC 1.011 08/12/2017 1842   LABSPEC 1.005 01/12/2017 1239   PHURINE 7.0 08/12/2017 1842   GLUCOSEU NEGATIVE 08/12/2017 1842   GLUCOSEU Negative 01/12/2017 1239   HGBUR NEGATIVE 08/12/2017 1842   BILIRUBINUR NEGATIVE 08/12/2017 1842   BILIRUBINUR neg 07/21/2017 1542   BILIRUBINUR Negative 01/12/2017 1239   KETONESUR 5 (A) 08/12/2017 1842   PROTEINUR 30 (A) 08/12/2017 1842   UROBILINOGEN 0.2 07/21/2017 1542   UROBILINOGEN 0.2 01/12/2017 1239   NITRITE NEGATIVE 08/12/2017 1842   LEUKOCYTESUR LARGE (A) 08/12/2017 1842   LEUKOCYTESUR Negative 01/12/2017 1239   Sepsis Labs: @LABRCNTIP (procalcitonin:4,lacticidven:4)  ) Recent Results (from the past 240 hour(s))  Urine culture     Status: Abnormal (Preliminary result)   Collection Time: 08/13/17  2:15 AM  Result Value Ref Range Status   Specimen Description URINE, CLEAN CATCH  Final   Special Requests Normal  Final   Culture (A)  Final    >=100,000 COLONIES/mL GRAM POSITIVE  [facility] IDENTIFICATION AND SUSCEPTIBILITIES TO FOLLOW    Report Status PENDING  Incomplete         Radiology Studies: Dg Chest 2 View  Result Date: 08/12/2017 CLINICAL DATA:  Right-sided pain after fall from bed. History of pneumonia. Former smoker. EXAM: CHEST  2 VIEW COMPARISON:  07/13/2017 chest CT FINDINGS: Top-normal size heart with aortic atherosclerosis. Port catheter is noted with tip in the distal SVC. Mild hyperinflation of the upper lobes with crowding of interstitial lung markings in the lower lobes. Streaky parenchymal opacities at the left base some which likely reflect chronic post radiation change and/or atelectasis. Superimposed pneumonia would be difficult to entirely exclude but based on history of fall, believed less likely. No acute nor suspicious osseous abnormalities. No pneumothorax. IMPRESSION: 1. Aortic atherosclerosis. 2. Left basilar  atelectasis, scarring and/or post radiated change. Superimposed subtle pneumonia is not entirely excluded but based on clinical history, believed less likely. 3. No acute fracture or pneumothorax. Electronically Signed   By: Ashley Royalty M.D.   On: 08/12/2017 23:41   Dg Lumbar Spine Complete  Result Date: 08/12/2017 CLINICAL DATA:  Pain after fall EXAM: LUMBAR SPINE - COMPLETE 4+ VIEW COMPARISON:  CXR 10/23/2016 FINDINGS: Lumbosacral transitional vertebral body. S1-S2 disc noted based on 12 ribbed thoracic vertebral bodies on the chest radiograph. The lowest square vertebral body will be labeled S1. No acute compression fracture. There is moderate disc space narrowing L4-5, L5-S1 and S1-S2 with associated degenerative facet arthropathy. Minimal grade 1 anterolisthesis of L5 on S1. Aortoiliac atherosclerosis noted without definite aneurysm. IMPRESSION: 1. Transitional lumbosacral vertebral body. 2. Lower lumbar degenerative disc and facet arthropathy L4 through S2. 3. No acute osseous abnormality. 4. Minimal grade 1 anterolisthesis of L5 on S1. Electronically Signed   By: Ashley Royalty M.D.   On: 08/12/2017 23:38   Ct Head Wo Contrast  Result Date: 08/12/2017 CLINICAL DATA:  Fall while on anti coagulation therapy. EXAM: CT HEAD WITHOUT CONTRAST TECHNIQUE: Contiguous axial images were obtained from the base of the skull through the vertex without intravenous contrast. COMPARISON:  Brain MRI 06/04/2017 FINDINGS: Brain: No mass lesion, intraparenchymal hemorrhage or extra-axial collection. No evidence of acute cortical infarct. There is periventricular hypoattenuation compatible with chronic microvascular disease. Mild atrophy Vascular: No hyperdense vessel or unexpected calcification. Skull: Normal visualized skull base, calvarium and extracranial soft tissues. Sinuses/Orbits: No sinus fluid levels or advanced mucosal thickening. No mastoid effusion. Normal orbits. IMPRESSION: Mild atrophy and chronic  microvascular disease without acute abnormality. Electronically Signed   By: Ulyses Jarred M.D.   On: 08/12/2017 23:29   Mr Thoracic Spine W Wo Contrast  Result Date: 08/13/2017 CLINICAL DATA:  Small cell lung cancer. Increased falls over the last 2 weeks. Right lower extremity pain and severe low back pain. Progressive numbness and weakness in the right lower extremity. Unable to walk. EXAM: MRI THORACIC AND LUMBAR SPINE WITHOUT AND WITH CONTRAST TECHNIQUE: Multiplanar and multiecho pulse sequences of the thoracic and lumbar spine were obtained without and with intravenous contrast. CONTRAST:  49mL MULTIHANCE GADOBENATE DIMEGLUMINE 529 MG/ML IV SOLN COMPARISON:  Lumbar spine radiographs 08/12/2017. PET scan 12/03/2016. FINDINGS: MRI THORACIC SPINE FINDINGS Alignment: AP alignment is anatomic. Rightward curvature of the thoracic spine is centered at T7-8. Vertebrae: Diffuse osseous metastatic disease is present. There is involvement of posterior elements on the right at T1 and bilaterally at T2. There is diffuse involvement of vertebral bodies throughout the remainder of the thoracic spine. Cord:  Normal signal is present  throughout the thoracic spinal cord Paraspinal and other soft tissues: The left infrahilar mass is increased. There is further collapse or metastatic disease into the left lung. Dependent right low lower lobe atelectasis is present. Bilateral effusions are noted, right greater than left. Numerous cystic lesions are again noted in the liver. Disc levels: T1-2: Facet hypertrophy contributes to foraminal narrowing, right greater than left. T2-3: Mild facet hypertrophy is present on the left without significant stenosis. T3-4:  No significant disc disease or stenosis. T4-5: Mild facet hypertrophy is present bilaterally without focal stenosis. T5-6: Facet hypertrophy is present bilaterally without focal stenosis. T6-7: Facet hypertrophy is present. Extraosseous tumor is present on the right without  foraminal invasion. T7-8: Extraosseous tumor extends into the right neural foramen with moderate right foraminal stenosis. The left foramen is patent. T8-9: Extraosseous tumor is noted beyond the left foramen. Facet hypertrophy contributes to mild foraminal narrowing, left greater than right. T9-10: Disc disease and facet hypertrophy contributes to mild foraminal narrowing bilaterally. T10-11: Disc disease and facet hypertrophy contributes to mild right foraminal narrowing. A perineural root sleeve cyst is present on the left. T11-12: Metastatic disease involves the right rib proximally without focal stenosis. Facet spurring contributes to mild left foraminal narrowing. A perineural root sleeve cyst is present on the right. T12-L1: Schmorl's nodes are present without significant stenosis. MRI LUMBAR SPINE FINDINGS Segmentation:  Transitional anatomy is present at S1. Alignment: Degenerative anterolisthesis is present at S1-2 and L5-S1. There is slight degenerative retrolisthesis at L4-5. Vertebrae: Extensive osseous metastatic disease is present throughout the lumbar spine and sacrum. Conus medullaris: Extends to the L1 level and appears normal. Paraspinal and other soft tissues: Extraosseous tumor is noted on the right at S1. This likely impacts the right S1 foramen anteriorly. There is distention of the urinary bladder with wall thickening. This may be neurogenic. Disc levels: Diffuse extraosseous tumor is present in the posterior epidural space from L2-3 through L3-4. The enhancing soft tissue mass measures 4.6 x 0.8 cm on the sagittal images. The tumor is 1.3 cm wide. This results in severe central canal stenosis at L3. The canal is narrowed to 3 mm in the midline. L1-2: Facet hypertrophy contributes to mild foraminal narrowing bilaterally, worse on the right. L2-3: A broad-based disc protrusion is present. Mild facet hypertrophy is noted. There is no significant stenosis at this level. L3-4: In addition to  tumor, a broad-based disc protrusion and facet hypertrophy contribute to moderate central canal stenosis. Moderate foraminal narrowing is worse on the left. L4-5: Extraosseous tumor extends into the right neural foramen resulting in severe right foraminal stenosis. Moderate left foraminal narrowing is due to disc disease and facet hypertrophy. Tumor also contributes to moderate right subarticular stenosis. L5-S1: A broad-based disc protrusion is present. Advanced facet hypertrophy is noted. This results an moderate subarticular stenosis bilaterally. Moderate left and mild right foraminal narrowing is present. Extraosseous tumor extends from the posterior elements into the right posterolateral aspect of the canal the superior right foramen. S1-2: Anterolisthesis results in uncovering of the disc with right greater than left subarticular and foraminal stenosis. IMPRESSION: 1. 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 The posterior tumor at L2-3 measures 4.6 x 0.8 x 1.3 cm. 2. Extraosseous tumor extends into the right foramen at L4-5 with severe right foraminal stenosis. 3. Extraosseous tumor and degenerative change contribute to moderate right subarticular and mild right foraminal stenosis at L5-S1. 4. Typical degenerative changes are also present in the lumbar  spine resulting in moderate foraminal narrowing bilaterally at L3-4, left greater than right. 5. Degenerate changes contribute to moderate left foraminal narrowing at L4-5. 6. Degenerative changes contribute to moderate left subarticular and foraminal narrowing at L5-S1. 7. Degenerative subarticular and foraminal narrowing at S1-2 is worse on the right. Prominent extraosseous tumor from the right sacral ala extends into the ventral soft tissues and likely impacts the ventral S1 foramen. 8. Diffuse osseous metastatic disease throughout the thoracic and lumbar spine. 9. Extraosseous tumor extends into the right neural  foramen at T7-8 with moderate right foraminal stenosis. 10. Extraosseous tumor is noted just beyond the left foramen at T8-9. This could impact the left T8 nerve root. 11. No other focal stenosis of the thoracic spine due to tumor. 12. Multilevel degenerative change and foraminal narrowing in the thoracic spine due to the endplate change and facet disease. Critical Value/emergent results were called by telephone at the time of interpretation on 08/13/2017 at 6:34 pm to Dr. Randa Spike , who verbally acknowledged these results. Electronically Signed   By: San Morelle M.D.   On: 08/13/2017 18:55   Mr Lumbar Spine W Wo Contrast  Result Date: 08/13/2017 CLINICAL DATA:  Small cell lung cancer. Increased falls over the last 2 weeks. Right lower extremity pain and severe low back pain. Progressive numbness and weakness in the right lower extremity. Unable to walk. EXAM: MRI THORACIC AND LUMBAR SPINE WITHOUT AND WITH CONTRAST TECHNIQUE: Multiplanar and multiecho pulse sequences of the thoracic and lumbar spine were obtained without and with intravenous contrast. CONTRAST:  75mL MULTIHANCE GADOBENATE DIMEGLUMINE 529 MG/ML IV SOLN COMPARISON:  Lumbar spine radiographs 08/12/2017. PET scan 12/03/2016. FINDINGS: MRI THORACIC SPINE FINDINGS Alignment: AP alignment is anatomic. Rightward curvature of the thoracic spine is centered at T7-8. Vertebrae: Diffuse osseous metastatic disease is present. There is involvement of posterior elements on the right at T1 and bilaterally at T2. There is diffuse involvement of vertebral bodies throughout the remainder of the thoracic spine. Cord:  Normal signal is present throughout the thoracic spinal cord Paraspinal and other soft tissues: The left infrahilar mass is increased. There is further collapse or metastatic disease into the left lung. Dependent right low lower lobe atelectasis is present. Bilateral effusions are noted, right greater than left. Numerous cystic lesions  are again noted in the liver. Disc levels: T1-2: Facet hypertrophy contributes to foraminal narrowing, right greater than left. T2-3: Mild facet hypertrophy is present on the left without significant stenosis. T3-4:  No significant disc disease or stenosis. T4-5: Mild facet hypertrophy is present bilaterally without focal stenosis. T5-6: Facet hypertrophy is present bilaterally without focal stenosis. T6-7: Facet hypertrophy is present. Extraosseous tumor is present on the right without foraminal invasion. T7-8: Extraosseous tumor extends into the right neural foramen with moderate right foraminal stenosis. The left foramen is patent. T8-9: Extraosseous tumor is noted beyond the left foramen. Facet hypertrophy contributes to mild foraminal narrowing, left greater than right. T9-10: Disc disease and facet hypertrophy contributes to mild foraminal narrowing bilaterally. T10-11: Disc disease and facet hypertrophy contributes to mild right foraminal narrowing. A perineural root sleeve cyst is present on the left. T11-12: Metastatic disease involves the right rib proximally without focal stenosis. Facet spurring contributes to mild left foraminal narrowing. A perineural root sleeve cyst is present on the right. T12-L1: Schmorl's nodes are present without significant stenosis. MRI LUMBAR SPINE FINDINGS Segmentation:  Transitional anatomy is present at S1. Alignment: Degenerative anterolisthesis is present at S1-2 and L5-S1. There  is slight degenerative retrolisthesis at L4-5. Vertebrae: Extensive osseous metastatic disease is present throughout the lumbar spine and sacrum. Conus medullaris: Extends to the L1 level and appears normal. Paraspinal and other soft tissues: Extraosseous tumor is noted on the right at S1. This likely impacts the right S1 foramen anteriorly. There is distention of the urinary bladder with wall thickening. This may be neurogenic. Disc levels: Diffuse extraosseous tumor is present in the posterior  epidural space from L2-3 through L3-4. The enhancing soft tissue mass measures 4.6 x 0.8 cm on the sagittal images. The tumor is 1.3 cm wide. This results in severe central canal stenosis at L3. The canal is narrowed to 3 mm in the midline. L1-2: Facet hypertrophy contributes to mild foraminal narrowing bilaterally, worse on the right. L2-3: A broad-based disc protrusion is present. Mild facet hypertrophy is noted. There is no significant stenosis at this level. L3-4: In addition to tumor, a broad-based disc protrusion and facet hypertrophy contribute to moderate central canal stenosis. Moderate foraminal narrowing is worse on the left. L4-5: Extraosseous tumor extends into the right neural foramen resulting in severe right foraminal stenosis. Moderate left foraminal narrowing is due to disc disease and facet hypertrophy. Tumor also contributes to moderate right subarticular stenosis. L5-S1: A broad-based disc protrusion is present. Advanced facet hypertrophy is noted. This results an moderate subarticular stenosis bilaterally. Moderate left and mild right foraminal narrowing is present. Extraosseous tumor extends from the posterior elements into the right posterolateral aspect of the canal the superior right foramen. S1-2: Anterolisthesis results in uncovering of the disc with right greater than left subarticular and foraminal stenosis. IMPRESSION: 1. 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 The posterior tumor at L2-3 measures 4.6 x 0.8 x 1.3 cm. 2. Extraosseous tumor extends into the right foramen at L4-5 with severe right foraminal stenosis. 3. Extraosseous tumor and degenerative change contribute to moderate right subarticular and mild right foraminal stenosis at L5-S1. 4. Typical degenerative changes are also present in the lumbar spine resulting in moderate foraminal narrowing bilaterally at L3-4, left greater than right. 5. Degenerate changes contribute to  moderate left foraminal narrowing at L4-5. 6. Degenerative changes contribute to moderate left subarticular and foraminal narrowing at L5-S1. 7. Degenerative subarticular and foraminal narrowing at S1-2 is worse on the right. Prominent extraosseous tumor from the right sacral ala extends into the ventral soft tissues and likely impacts the ventral S1 foramen. 8. Diffuse osseous metastatic disease throughout the thoracic and lumbar spine. 9. Extraosseous tumor extends into the right neural foramen at T7-8 with moderate right foraminal stenosis. 10. Extraosseous tumor is noted just beyond the left foramen at T8-9. This could impact the left T8 nerve root. 11. No other focal stenosis of the thoracic spine due to tumor. 12. Multilevel degenerative change and foraminal narrowing in the thoracic spine due to the endplate change and facet disease. Critical Value/emergent results were called by telephone at the time of interpretation on 08/13/2017 at 6:34 pm to Dr. Randa Spike , who verbally acknowledged these results. Electronically Signed   By: San Morelle M.D.   On: 08/13/2017 18:55   Dg Foot Complete Right  Result Date: 08/12/2017 CLINICAL DATA:  Bruising of the right toes after fall today. EXAM: RIGHT FOOT COMPLETE - 3+ VIEW COMPARISON:  None. FINDINGS: Hallux rigidus of the great toe with marked joint space narrowing and spurring of the first MTP. No acute fracture or malalignment is noted. There is dorsal soft tissue  swelling of the forefoot. There is a tiny plantar calcaneal enthesophyte. IMPRESSION: 1. Hallux rigidus of the first MTP with marked joint space narrowing and spurring. 2. No acute osseous abnormality. 3. Dorsal soft tissue swelling over the forefoot. 4. Small plantar calcaneal enthesophyte. Electronically Signed   By: Ashley Royalty M.D.   On: 08/12/2017 23:32   Dg Hip Unilat With Pelvis 1v Left  Result Date: 08/12/2017 CLINICAL DATA:  Loss of balance and fell off bed.  Left hip pain.  EXAM: DG HIP (WITH OR WITHOUT PELVIS) 1V*L* COMPARISON:  None. FINDINGS: There is no evidence of hip fracture or dislocation. There is degenerative disc space narrowing at L4-5. L5-S1 degenerative facet hypertrophy and sclerosis. No suspicious osseous lesions of the bony pelvis and hips. Intact sacroiliac joints and pubic symphysis. IMPRESSION: No acute fracture or malalignment of either hip. There is lower lumbar facet arthropathy and disc disease. Electronically Signed   By: Ashley Royalty M.D.   On: 08/12/2017 23:30        Scheduled Meds: . apixaban  5 mg Oral BID  . aspirin EC  81 mg Oral Daily  . dexamethasone  4 mg Intravenous Q12H  . escitalopram  5 mg Oral Daily  . gabapentin  300 mg Oral TID  . lidocaine  1 patch Transdermal Q24H  . multivitamin with minerals  1 tablet Oral Daily  . omega-3 acid ethyl esters  2 g Oral Daily  . pantoprazole  20 mg Oral Daily  . QUEtiapine  25 mg Oral QHS  . simvastatin  20 mg Oral q1800  . sodium chloride flush  3 mL Intravenous Q12H  . sodium chloride flush  3 mL Intravenous Q12H  . tamsulosin  0.4 mg Oral Daily   Continuous Infusions: . sodium chloride 250 mL (08/14/17 0018)  . ampicillin (OMNIPEN) IV Stopped (08/14/17 1145)  . lactated ringers 10 mL/hr at 08/13/17 1514     LOS: 0 days    Time spent: 17min    Domenic Polite, MD [facility] Page via [email or web address], password [email or web address] After 7PM please contact night-coverage  08/14/2017, 1:30 PM

## 2017-08-14 NOTE — Consult Note (Signed)
Radiation Oncology         [phone] ________________________________  Name: Christian Munoz MRN: [id number]  Date: 08/12/2017  DOB: 25-Jul-1935  VO:[id number], [name], NP  No ref. provider found     REFERRING PHYSICIAN: No ref. provider found   DIAGNOSIS: The primary encounter diagnosis was Fall, initial encounter. Diagnoses of Pain, Radicular leg pain, and Lower urinary tract infectious disease were also pertinent to this visit.   HISTORY OF PRESENT ILLNESS::Christian Munoz is a 82 y.o. male who is seen for an initial consultation visit regarding the patient's diagnosis of small cell lung cancer. Patient has had recent UTI, low back pain and pain/ weakness in right lower extremity. The patient unfortuantely has evidence of stage IV disease currently, after chemoradiation to the lung this summer for definitive management of his disease at that time. Extensive disease is seen in the thoracolumbar spine, with the most prominent disease in the lumbar spine. No cord compression present, but canal stenosis is present in L-spine and foraminal involvement present at multiple levels.    PREVIOUS RADIATION THERAPY: Yes , thoracic XRT this summer as above   PAST MEDICAL HISTORY:  has a past medical history of Allergic rhinitis due to pollen; Anginal pain ([place]); Cancer associated pain (02/08/2017); Coronary atherosclerosis of native coronary artery; Depressive disorder, not elsewhere classified; Dysuria (01/18/2017); Encounter for antineoplastic chemotherapy (11/30/2016); GERD (gastroesophageal reflux disease); Goals of care, counseling/discussion (11/30/2016); Headache; Herpes genitalia; Hypertrophy of prostate without urinary obstruction and other lower urinary tract symptoms (LUTS); Lack of coordination; Lumbago; Nausea and vomiting (11/30/2016); Osteoarthrosis, unspecified whether generalized or localized, unspecified site; Other and unspecified hyperlipidemia; Other malaise and fatigue;  Pneumonia; Reflux esophagitis; Skin cancer; Sleep apnea; Syncope and collapse; Urinary frequency; and Vitamin D deficiency.     PAST SURGICAL HISTORY: Past Surgical History:  Procedure Laterality Date  . CARDIAC CATHETERIZATION  2000   exact date unclear, was done in [place], [place]. No PCI  . COLONOSCOPY    . EYE SURGERY    . IR GENERIC HISTORICAL  12/04/2016   IR US GUIDE VASC ACCESS RIGHT 12/04/2016 Greggory Keen, MD [facility]  . IR GENERIC HISTORICAL  12/04/2016   IR FLUORO GUIDE PORT INSERTION RIGHT 12/04/2016 Greggory Keen, MD [facility]  . poly vocal cords  1985  . SKIN CANCER EXCISION  2011  . TONSILLECTOMY    . VASECTOMY    . VIDEO BRONCHOSCOPY WITH ENDOBRONCHIAL ULTRASOUND N/A 11/20/2016   Procedure: VIDEO BRONCHOSCOPY WITH ENDOBRONCHIAL ULTRASOUND;  Surgeon: Collene Gobble, MD;  Location: [facility] OR;  Service: Thoracic;  Laterality: N/A;     FAMILY HISTORY: family history includes Cancer in his brother, brother, and father; Heart disease in his father.   SOCIAL HISTORY:  reports that he quit smoking about a year ago. His smoking use included Cigarettes. He has a 30.00 pack-year smoking history. He has never used smokeless tobacco. He reports that he does not drink alcohol or use drugs.   ALLERGIES: Chantix [varenicline] and Zoloft [sertraline hcl]   MEDICATIONS:  Current Facility-Administered Medications  Medication Dose Route Frequency Provider Last Rate Last Dose  . 0.9 %  sodium chloride infusion  250 mL Intravenous PRN Lady Deutscher, MD 10 mL/hr at 08/14/17 0018 250 mL at 08/14/17 0018  . acetaminophen (TYLENOL) tablet 650 mg  650 mg Oral Q6H PRN Lady Deutscher, MD       Or  . acetaminophen (TYLENOL) suppository 650 mg  650 mg Rectal Q6H PRN Randa Spike  C, MD      . ampicillin (OMNIPEN) 1 g in sodium chloride 0.9 % 50 mL IVPB  1 g Intravenous Q6H Lady Deutscher, MD   Stopped at 08/14/17 1838  . apixaban (ELIQUIS) tablet 5 mg  5 mg Oral BID Lady Deutscher, MD   5 mg at 08/14/17 3790  . aspirin EC tablet 81 mg  81 mg Oral Daily Lady Deutscher, MD   81 mg at 08/14/17 0913  . dexamethasone (DECADRON) injection 4 mg  4 mg Intravenous Q12H Domenic Polite, MD      . escitalopram (LEXAPRO) tablet 5 mg  5 mg Oral Daily Lady Deutscher, MD   5 mg at 08/14/17 0912  . gabapentin (NEURONTIN) capsule 300 mg  300 mg Oral TID Lady Deutscher, MD   300 mg at 08/14/17 1554  . ketorolac (TORADOL) 30 MG/ML injection 30 mg  30 mg Intravenous Q6H PRN Lady Deutscher, MD   30 mg at 08/13/17 2012  . lactated ringers infusion   Intravenous Continuous Nolon Nations, MD 10 mL/hr at 08/13/17 1514    . lidocaine (LIDODERM) 5 % 1 patch  1 patch Transdermal Q24H Loistine Chance, MD   1 patch at 08/14/17 1120  . multivitamin with minerals tablet 1 tablet  1 tablet Oral Daily Lady Deutscher, MD   1 tablet at 08/14/17 0914  . omega-3 acid ethyl esters (LOVAZA) capsule 2 g  2 g Oral Daily Lady Deutscher, MD   2 g at 08/14/17 0915  . ondansetron (ZOFRAN) tablet 4 mg  4 mg Oral Q6H PRN Lady Deutscher, MD       Or  . ondansetron [facility]) injection 4 mg  4 mg Intravenous Q6H PRN Lady Deutscher, MD      . oxyCODONE-acetaminophen (PERCOCET/ROXICET) 5-325 MG per tablet 1 tablet  1 tablet Oral Q6H PRN Lady Deutscher, MD   1 tablet at 08/14/17 [phone]  . pantoprazole (PROTONIX) EC tablet 20 mg  20 mg Oral Daily Lady Deutscher, MD   20 mg at 08/14/17 0913  . QUEtiapine (SEROQUEL) tablet 25 mg  25 mg Oral QHS Lady Deutscher, MD   25 mg at 08/13/17 2040  . simvastatin (ZOCOR) tablet 20 mg  20 mg Oral q1800 Lady Deutscher, MD      . sodium chloride flush (NS) 0.9 % injection 3 mL  3 mL Intravenous Q12H Lady Deutscher, MD   3 mL at 08/13/17 1230  . sodium chloride flush (NS) 0.9 % injection 3 mL  3 mL Intravenous Q12H Lady Deutscher, MD      . sodium chloride flush (NS) 0.9 % injection 3 mL  3 mL Intravenous PRN Lady Deutscher, MD       . tamsulosin [facility]) capsule 0.4 mg  0.4 mg Oral Daily Lady Deutscher, MD   0.4 mg at 08/14/17 7353     REVIEW OF SYSTEMS:  A 15 point review of systems is documented in the electronic medical record. This was obtained by the nursing staff. However, I reviewed this with the patient to discuss relevant findings and make appropriate changes.  Pertinent items are noted in HPI.    PHYSICAL EXAM:  height is 5\' 10"  (1.778 m) and weight is 160 lb (72.6 kg). His oral temperature is 98.4 F (36.9 C). His blood pressure is 130/65 and his pulse is 88. His respiration is 17 and oxygen saturation is 92%.  ECOG = 2-3  0 - Asymptomatic (Fully active, able to carry on all predisease activities without restriction)  1 - Symptomatic but completely ambulatory (Restricted in physically strenuous activity but ambulatory and able to carry out work of a light or sedentary nature. For example, light housework, office work)  2 - Symptomatic, <50% in bed during the day (Ambulatory and capable of all self care but unable to carry out any work activities. Up and about more than 50% of waking hours)  3 - Symptomatic, >50% in bed, but not bedbound (Capable of only limited self-care, confined to bed or chair 50% or more of waking hours)  4 - Bedbound (Completely disabled. Cannot carry on any self-care. Totally confined to bed or chair)  5 - Death   Eustace Pen MM, Creech RH, Tormey DC, et al. [phone]). "Toxicity and response criteria of the [facility]". [place] Oncol. 5 (6): 649-55  Neuro - RLE 4/5; 5/5 on left   LABORATORY DATA:  Lab Results  Component Value Date   WBC 7.4 08/14/2017   HGB 8.6 (L) 08/14/2017   HCT 28.7 (L) 08/14/2017   MCV 84.2 08/14/2017   PLT 155 08/14/2017   Lab Results  Component Value Date   NA 136 08/14/2017   K 4.3 08/14/2017   CL 99 (L) 08/14/2017   CO2 24 08/14/2017   Lab Results  Component Value Date   ALT 16 07/29/2017   AST 72 (H) 07/29/2017     ALKPHOS 70 07/29/2017   BILITOT 0.52 07/29/2017      RADIOGRAPHY: Dg Chest 2 View  Result Date: 08/12/2017 CLINICAL DATA:  Right-sided pain after fall from bed. History of pneumonia. Former smoker. EXAM: CHEST  2 VIEW COMPARISON:  07/13/2017 chest CT FINDINGS: Top-normal size heart with aortic atherosclerosis. Port catheter is noted with tip in the distal SVC. Mild hyperinflation of the upper lobes with crowding of interstitial lung markings in the lower lobes. Streaky parenchymal opacities at the left base some which likely reflect chronic post radiation change and/or atelectasis. Superimposed pneumonia would be difficult to entirely exclude but based on history of fall, believed less likely. No acute nor suspicious osseous abnormalities. No pneumothorax. IMPRESSION: 1. Aortic atherosclerosis. 2. Left basilar atelectasis, scarring and/or post radiated change. Superimposed subtle pneumonia is not entirely excluded but based on clinical history, believed less likely. 3. No acute fracture or pneumothorax. Electronically Signed   By: Ashley Royalty M.D.   On: 08/12/2017 23:41   Dg Lumbar Spine Complete  Result Date: 08/12/2017 CLINICAL DATA:  Pain after fall EXAM: LUMBAR SPINE - COMPLETE 4+ VIEW COMPARISON:  CXR 10/23/2016 FINDINGS: Lumbosacral transitional vertebral body. S1-S2 disc noted based on 12 ribbed thoracic vertebral bodies on the chest radiograph. The lowest square vertebral body will be labeled S1. No acute compression fracture. There is moderate disc space narrowing L4-5, L5-S1 and S1-S2 with associated degenerative facet arthropathy. Minimal grade 1 anterolisthesis of L5 on S1. Aortoiliac atherosclerosis noted without definite aneurysm. IMPRESSION: 1. Transitional lumbosacral vertebral body. 2. Lower lumbar degenerative disc and facet arthropathy L4 through S2. 3. No acute osseous abnormality. 4. Minimal grade 1 anterolisthesis of L5 on S1. Electronically Signed   By: Ashley Royalty M.D.   On:  08/12/2017 23:38   Ct Head Wo Contrast  Result Date: 08/12/2017 CLINICAL DATA:  Fall while on anti coagulation therapy. EXAM: CT HEAD WITHOUT CONTRAST TECHNIQUE: Contiguous axial images were obtained from the base of the skull through the vertex without  intravenous contrast. COMPARISON:  Brain MRI 06/04/2017 FINDINGS: Brain: No mass lesion, intraparenchymal hemorrhage or extra-axial collection. No evidence of acute cortical infarct. There is periventricular hypoattenuation compatible with chronic microvascular disease. Mild atrophy Vascular: No hyperdense vessel or unexpected calcification. Skull: Normal visualized skull base, calvarium and extracranial soft tissues. Sinuses/Orbits: No sinus fluid levels or advanced mucosal thickening. No mastoid effusion. Normal orbits. IMPRESSION: Mild atrophy and chronic microvascular disease without acute abnormality. Electronically Signed   By: Ulyses Jarred M.D.   On: 08/12/2017 23:29   Mr Thoracic Spine W Wo Contrast  Result Date: 08/13/2017 CLINICAL DATA:  Small cell lung cancer. Increased falls over the last 2 weeks. Right lower extremity pain and severe low back pain. Progressive numbness and weakness in the right lower extremity. Unable to walk. EXAM: MRI THORACIC AND LUMBAR SPINE WITHOUT AND WITH CONTRAST TECHNIQUE: Multiplanar and multiecho pulse sequences of the thoracic and lumbar spine were obtained without and with intravenous contrast. CONTRAST:  11mL MULTIHANCE GADOBENATE DIMEGLUMINE 529 MG/ML IV SOLN COMPARISON:  Lumbar spine radiographs 08/12/2017. PET scan 12/03/2016. FINDINGS: MRI THORACIC SPINE FINDINGS Alignment: AP alignment is anatomic. Rightward curvature of the thoracic spine is centered at T7-8. Vertebrae: Diffuse osseous metastatic disease is present. There is involvement of posterior elements on the right at T1 and bilaterally at T2. There is diffuse involvement of vertebral bodies throughout the remainder of the thoracic spine. Cord:  Normal  signal is present throughout the thoracic spinal cord Paraspinal and other soft tissues: The left infrahilar mass is increased. There is further collapse or metastatic disease into the left lung. Dependent right low lower lobe atelectasis is present. Bilateral effusions are noted, right greater than left. Numerous cystic lesions are again noted in the liver. Disc levels: T1-2: Facet hypertrophy contributes to foraminal narrowing, right greater than left. T2-3: Mild facet hypertrophy is present on the left without significant stenosis. T3-4:  No significant disc disease or stenosis. T4-5: Mild facet hypertrophy is present bilaterally without focal stenosis. T5-6: Facet hypertrophy is present bilaterally without focal stenosis. T6-7: Facet hypertrophy is present. Extraosseous tumor is present on the right without foraminal invasion. T7-8: Extraosseous tumor extends into the right neural foramen with moderate right foraminal stenosis. The left foramen is patent. T8-9: Extraosseous tumor is noted beyond the left foramen. Facet hypertrophy contributes to mild foraminal narrowing, left greater than right. T9-10: Disc disease and facet hypertrophy contributes to mild foraminal narrowing bilaterally. T10-11: Disc disease and facet hypertrophy contributes to mild right foraminal narrowing. A perineural root sleeve cyst is present on the left. T11-12: Metastatic disease involves the right rib proximally without focal stenosis. Facet spurring contributes to mild left foraminal narrowing. A perineural root sleeve cyst is present on the right. T12-L1: Schmorl's nodes are present without significant stenosis. MRI LUMBAR SPINE FINDINGS Segmentation:  Transitional anatomy is present at S1. Alignment: Degenerative anterolisthesis is present at S1-2 and L5-S1. There is slight degenerative retrolisthesis at L4-5. Vertebrae: Extensive osseous metastatic disease is present throughout the lumbar spine and sacrum. Conus medullaris:  Extends to the L1 level and appears normal. Paraspinal and other soft tissues: Extraosseous tumor is noted on the right at S1. This likely impacts the right S1 foramen anteriorly. There is distention of the urinary bladder with wall thickening. This may be neurogenic. Disc levels: Diffuse extraosseous tumor is present in the posterior epidural space from L2-3 through L3-4. The enhancing soft tissue mass measures 4.6 x 0.8 cm on the sagittal images. The tumor is 1.3 cm wide.  This results in severe central canal stenosis at L3. The canal is narrowed to 3 mm in the midline. L1-2: Facet hypertrophy contributes to mild foraminal narrowing bilaterally, worse on the right. L2-3: A broad-based disc protrusion is present. Mild facet hypertrophy is noted. There is no significant stenosis at this level. L3-4: In addition to tumor, a broad-based disc protrusion and facet hypertrophy contribute to moderate central canal stenosis. Moderate foraminal narrowing is worse on the left. L4-5: Extraosseous tumor extends into the right neural foramen resulting in severe right foraminal stenosis. Moderate left foraminal narrowing is due to disc disease and facet hypertrophy. Tumor also contributes to moderate right subarticular stenosis. L5-S1: A broad-based disc protrusion is present. Advanced facet hypertrophy is noted. This results an moderate subarticular stenosis bilaterally. Moderate left and mild right foraminal narrowing is present. Extraosseous tumor extends from the posterior elements into the right posterolateral aspect of the canal the superior right foramen. S1-2: Anterolisthesis results in uncovering of the disc with right greater than left subarticular and foraminal stenosis. IMPRESSION: 1. 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 The posterior tumor at L2-3 measures 4.6 x 0.8 x 1.3 cm. 2. Extraosseous tumor extends into the right foramen at L4-5 with severe right foraminal  stenosis. 3. Extraosseous tumor and degenerative change contribute to moderate right subarticular and mild right foraminal stenosis at L5-S1. 4. Typical degenerative changes are also present in the lumbar spine resulting in moderate foraminal narrowing bilaterally at L3-4, left greater than right. 5. Degenerate changes contribute to moderate left foraminal narrowing at L4-5. 6. Degenerative changes contribute to moderate left subarticular and foraminal narrowing at L5-S1. 7. Degenerative subarticular and foraminal narrowing at S1-2 is worse on the right. Prominent extraosseous tumor from the right sacral ala extends into the ventral soft tissues and likely impacts the ventral S1 foramen. 8. Diffuse osseous metastatic disease throughout the thoracic and lumbar spine. 9. Extraosseous tumor extends into the right neural foramen at T7-8 with moderate right foraminal stenosis. 10. Extraosseous tumor is noted just beyond the left foramen at T8-9. This could impact the left T8 nerve root. 11. No other focal stenosis of the thoracic spine due to tumor. 12. Multilevel degenerative change and foraminal narrowing in the thoracic spine due to the endplate change and facet disease. Critical Value/emergent results were called by telephone at the time of interpretation on 08/13/2017 at 6:34 pm to Dr. Randa Spike , who verbally acknowledged these results. Electronically Signed   By: San Morelle M.D.   On: 08/13/2017 18:55   Mr Lumbar Spine W Wo Contrast  Result Date: 08/13/2017 CLINICAL DATA:  Small cell lung cancer. Increased falls over the last 2 weeks. Right lower extremity pain and severe low back pain. Progressive numbness and weakness in the right lower extremity. Unable to walk. EXAM: MRI THORACIC AND LUMBAR SPINE WITHOUT AND WITH CONTRAST TECHNIQUE: Multiplanar and multiecho pulse sequences of the thoracic and lumbar spine were obtained without and with intravenous contrast. CONTRAST:  23mL MULTIHANCE  GADOBENATE DIMEGLUMINE 529 MG/ML IV SOLN COMPARISON:  Lumbar spine radiographs 08/12/2017. PET scan 12/03/2016. FINDINGS: MRI THORACIC SPINE FINDINGS Alignment: AP alignment is anatomic. Rightward curvature of the thoracic spine is centered at T7-8. Vertebrae: Diffuse osseous metastatic disease is present. There is involvement of posterior elements on the right at T1 and bilaterally at T2. There is diffuse involvement of vertebral bodies throughout the remainder of the thoracic spine. Cord:  Normal signal is present throughout the thoracic spinal cord  Paraspinal and other soft tissues: The left infrahilar mass is increased. There is further collapse or metastatic disease into the left lung. Dependent right low lower lobe atelectasis is present. Bilateral effusions are noted, right greater than left. Numerous cystic lesions are again noted in the liver. Disc levels: T1-2: Facet hypertrophy contributes to foraminal narrowing, right greater than left. T2-3: Mild facet hypertrophy is present on the left without significant stenosis. T3-4:  No significant disc disease or stenosis. T4-5: Mild facet hypertrophy is present bilaterally without focal stenosis. T5-6: Facet hypertrophy is present bilaterally without focal stenosis. T6-7: Facet hypertrophy is present. Extraosseous tumor is present on the right without foraminal invasion. T7-8: Extraosseous tumor extends into the right neural foramen with moderate right foraminal stenosis. The left foramen is patent. T8-9: Extraosseous tumor is noted beyond the left foramen. Facet hypertrophy contributes to mild foraminal narrowing, left greater than right. T9-10: Disc disease and facet hypertrophy contributes to mild foraminal narrowing bilaterally. T10-11: Disc disease and facet hypertrophy contributes to mild right foraminal narrowing. A perineural root sleeve cyst is present on the left. T11-12: Metastatic disease involves the right rib proximally without focal stenosis.  Facet spurring contributes to mild left foraminal narrowing. A perineural root sleeve cyst is present on the right. T12-L1: Schmorl's nodes are present without significant stenosis. MRI LUMBAR SPINE FINDINGS Segmentation:  Transitional anatomy is present at S1. Alignment: Degenerative anterolisthesis is present at S1-2 and L5-S1. There is slight degenerative retrolisthesis at L4-5. Vertebrae: Extensive osseous metastatic disease is present throughout the lumbar spine and sacrum. Conus medullaris: Extends to the L1 level and appears normal. Paraspinal and other soft tissues: Extraosseous tumor is noted on the right at S1. This likely impacts the right S1 foramen anteriorly. There is distention of the urinary bladder with wall thickening. This may be neurogenic. Disc levels: Diffuse extraosseous tumor is present in the posterior epidural space from L2-3 through L3-4. The enhancing soft tissue mass measures 4.6 x 0.8 cm on the sagittal images. The tumor is 1.3 cm wide. This results in severe central canal stenosis at L3. The canal is narrowed to 3 mm in the midline. L1-2: Facet hypertrophy contributes to mild foraminal narrowing bilaterally, worse on the right. L2-3: A broad-based disc protrusion is present. Mild facet hypertrophy is noted. There is no significant stenosis at this level. L3-4: In addition to tumor, a broad-based disc protrusion and facet hypertrophy contribute to moderate central canal stenosis. Moderate foraminal narrowing is worse on the left. L4-5: Extraosseous tumor extends into the right neural foramen resulting in severe right foraminal stenosis. Moderate left foraminal narrowing is due to disc disease and facet hypertrophy. Tumor also contributes to moderate right subarticular stenosis. L5-S1: A broad-based disc protrusion is present. Advanced facet hypertrophy is noted. This results an moderate subarticular stenosis bilaterally. Moderate left and mild right foraminal narrowing is present.  Extraosseous tumor extends from the posterior elements into the right posterolateral aspect of the canal the superior right foramen. S1-2: Anterolisthesis results in uncovering of the disc with right greater than left subarticular and foraminal stenosis. IMPRESSION: 1. 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 The posterior tumor at L2-3 measures 4.6 x 0.8 x 1.3 cm. 2. Extraosseous tumor extends into the right foramen at L4-5 with severe right foraminal stenosis. 3. Extraosseous tumor and degenerative change contribute to moderate right subarticular and mild right foraminal stenosis at L5-S1. 4. Typical degenerative changes are also present in the lumbar spine resulting in moderate foraminal  narrowing bilaterally at L3-4, left greater than right. 5. Degenerate changes contribute to moderate left foraminal narrowing at L4-5. 6. Degenerative changes contribute to moderate left subarticular and foraminal narrowing at L5-S1. 7. Degenerative subarticular and foraminal narrowing at S1-2 is worse on the right. Prominent extraosseous tumor from the right sacral ala extends into the ventral soft tissues and likely impacts the ventral S1 foramen. 8. Diffuse osseous metastatic disease throughout the thoracic and lumbar spine. 9. Extraosseous tumor extends into the right neural foramen at T7-8 with moderate right foraminal stenosis. 10. Extraosseous tumor is noted just beyond the left foramen at T8-9. This could impact the left T8 nerve root. 11. No other focal stenosis of the thoracic spine due to tumor. 12. Multilevel degenerative change and foraminal narrowing in the thoracic spine due to the endplate change and facet disease. Critical Value/emergent results were called by telephone at the time of interpretation on 08/13/2017 at 6:34 pm to Dr. Randa Spike , who verbally acknowledged these results. Electronically Signed   By: San Morelle M.D.   On: 08/13/2017 18:55    Dg Foot Complete Right  Result Date: 08/12/2017 CLINICAL DATA:  Bruising of the right toes after fall today. EXAM: RIGHT FOOT COMPLETE - 3+ VIEW COMPARISON:  None. FINDINGS: Hallux rigidus of the great toe with marked joint space narrowing and spurring of the first MTP. No acute fracture or malalignment is noted. There is dorsal soft tissue swelling of the forefoot. There is a tiny plantar calcaneal enthesophyte. IMPRESSION: 1. Hallux rigidus of the first MTP with marked joint space narrowing and spurring. 2. No acute osseous abnormality. 3. Dorsal soft tissue swelling over the forefoot. 4. Small plantar calcaneal enthesophyte. Electronically Signed   By: Ashley Royalty M.D.   On: 08/12/2017 23:32   Dg Hip Unilat With Pelvis 1v Left  Result Date: 08/12/2017 CLINICAL DATA:  Loss of balance and fell off bed.  Left hip pain. EXAM: DG HIP (WITH OR WITHOUT PELVIS) 1V*L* COMPARISON:  None. FINDINGS: There is no evidence of hip fracture or dislocation. There is degenerative disc space narrowing at L4-5. L5-S1 degenerative facet hypertrophy and sclerosis. No suspicious osseous lesions of the bony pelvis and hips. Intact sacroiliac joints and pubic symphysis. IMPRESSION: No acute fracture or malalignment of either hip. There is lower lumbar facet arthropathy and disc disease. Electronically Signed   By: Ashley Royalty M.D.   On: 08/12/2017 23:30       IMPRESSION:  The patient now is diagnosed with stage IV small cell lung cancer. This is a new diagnosis, with extensive spine involvement. I discussed possible XRT with the patient to the L-spine, primarily for possible improvement in quality of life. After discussing the rationale of possible treatment vs. Alternatives, the patient does wish to proceed with XRT. We will plan to proceed with simulation Monday for treatment planning, with XRT beginning Monday as well. Pain appears much improved since admission.   PLAN:  -continue decadron -simulation and 1st XRT  Monday to L-spine -possible transfer to [facility] Monday, staying at [facility] after simulation, if patient remains an inpatient through the weekend -f/u with medical oncology as outpatient    ________________________________   Jodelle Gross, MD, PhD   **Disclaimer: This note was dictated with voice recognition software. Similar sounding words can inadvertently be transcribed and this note may contain transcription errors which may not have been corrected upon publication of note.**

## 2017-08-14 NOTE — Discharge Instructions (Signed)
Information on my medicine - ELIQUIS (apixaban)  This medication education was reviewed with me or my healthcare representative as part of my discharge preparation.  The pharmacist that spoke with me during my hospital stay was:  Saundra Shelling, [facility]  Why was Eliquis prescribed for you? Eliquis was prescribed for you to reduce the risk of a blood clot forming that can cause a stroke if you have a medical condition called atrial fibrillation (a type of irregular heartbeat).  Also for history of recent PE.  What do You need to know about Eliquis ? Take your Eliquis TWICE DAILY - one tablet in the morning and one tablet in the evening with or without food. If you have difficulty swallowing the tablet whole please discuss with your pharmacist how to take the medication safely.  Take Eliquis exactly as prescribed by your doctor and DO NOT stop taking Eliquis without talking to the doctor who prescribed the medication.  Stopping may increase your risk of developing a stroke.  Refill your prescription before you run out.  After discharge, you should have regular check-up appointments with your healthcare provider that is prescribing your Eliquis.  In the future your dose may need to be changed if your kidney function or weight changes by a significant amount or as you get older.  What do you do if you miss a dose? If you miss a dose, take it as soon as you remember on the same day and resume taking twice daily.  Do not take more than one dose of ELIQUIS at the same time to make up a missed dose.  Important Safety Information A possible side effect of Eliquis is bleeding. You should call your healthcare provider right away if you experience any of the following: ? Bleeding from an injury or your nose that does not stop. ? Unusual colored urine (red or dark brown) or unusual colored stools (red or black). ? Unusual bruising for unknown reasons. ? A serious fall or if you hit your head (even if  there is no bleeding).  Some medicines may interact with Eliquis and might increase your risk of bleeding or clotting while on Eliquis. To help avoid this, consult your healthcare provider or pharmacist prior to using any new prescription or non-prescription medications, including herbals, vitamins, non-steroidal anti-inflammatory drugs (NSAIDs) and supplements.  This website has more information on Eliquis (apixaban): http://www.eliquis.com/eliquis/home

## 2017-08-14 NOTE — Consult Note (Signed)
Consultation Note Date: 08/14/2017   Patient Name: Christian Munoz  DOB: [birth year]  MRN: [id number]  Age / Sex: 82 y.o., male  PCP: Christian Chandler, NP Referring Physician: Domenic Polite, MD  Reason for Consultation: Establishing goals of care  HPI/Patient Profile: 82 y.o. male  with past medical history of small cell lung cancer, atrial fibrillation, pulmonary embolism  admitted on 08/12/2017 with acute back pain, weakness and right lower extremity, to undergo MRI for additional workup .   Clinical Assessment and Goals of Care:  82 year old with a history of small cell lung cancer atrial fibrillation and pulmonary embolism. His medical oncologist is Dr. Rogue Munoz. The patient lives alone in an apartment in [place], [place]. 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  The patient has been admitted with acute back pain, weakness in right lower extremity, urinary symptoms. MRI has shown epidural tumor at the level of L3, L4-L5, L5-S1, degenerative changes most pronounced at L3-L4, tumor involvement at right sacral ala, diffuse osseous metastatic disease, also involving thoracic spine at the level of T8.  Patient has been admitted for undergoing MRI and pain management. The patient has received IV morphine 1 mg 3 in the past 24 hours since admission. Additionally he is on oxycodone/Tylenol that he is receiving on an as-needed basis.  A palliative consultation has been requested for goals of care discussions.  I met with the patient and his son Christian Munoz at the bedside. I introduced myself and palliative care as follows:  Palliative medicine is specialized medical care for people living with serious illness. It focuses on providing relief from the symptoms and stress of a serious illness. The goal is to improve quality of life for both the patient and  the family.  The patient states that his biggest goal is to live in comfort and dignity. He doesn't want things done without meaning or reason. He does not want life prolongation if it comes at the cost of unbearable symptoms. Mostly, he wishes to be in his own surroundings and as independent as possible. Extensive discussions about goals wishes and values accomplished.  We discussed about seeking input from medical oncology with regards to what this MRI means. Additionally, we discussed about possible role of radiation oncology. We discussed about skilled nursing facility with rehabilitation attempt versus trying to maximize services at home through hospice agency.  We discussed about appropriate symptom management we discussed about appropriate disposition options. Hospice philosophy of care also explained in some detail. All of the patient's and son's questions answered to the best of my ability. Will add a low-dose Lidoderm patch for adjuvant pain relief. Continue opioids for now. Monitor hospital course. Additional recommendations listed below. Thank you for the consult.  NEXT OF KIN  Son Christian Munoz who lives locally, has a daughter Christian Munoz who lives in [place] with DNR DNI Recommend med onc follow up Recommend radiation onc consult to comment on MRI spine Disposition/Goals of care:  likely will need SNF rehab with palliative versus home with hospice on discharge.   Code Status/Advance Care Planning:  DNR    Symptom Management:      Palliative Prophylaxis:   Delirium Protocol  Psycho-social/Spiritual:   Desire for further Chaplaincy support:no  Additional Recommendations: Education on Hospice  Prognosis:   Unable to determine  Discharge Planning: To Be Determined      Primary Diagnoses: Present on Admission: . Leg weakness . Acute lower UTI . Benign prostatic hyperplasia with urinary obstruction . Atrial fibrillation, chronic ([place]) .  Hypertension . Hyperlipidemia . Small cell lung cancer in adult [facility])   I have reviewed the medical record, interviewed the patient and family, and examined the patient. The following aspects are pertinent.  Past Medical History:  Diagnosis Date  . Allergic rhinitis due to pollen   . Anginal pain ([place])    Past medical history " does not seem to be a problem at all now."  . Cancer associated pain 02/08/2017  . Coronary atherosclerosis of native coronary artery   . Depressive disorder, not elsewhere classified   . Dysuria 01/18/2017  . Encounter for antineoplastic chemotherapy 11/30/2016  . GERD (gastroesophageal reflux disease)   . Goals of care, counseling/discussion 11/30/2016  . Headache    PMH: Migraines  . Herpes genitalia   . Hypertrophy of prostate without urinary obstruction and other lower urinary tract symptoms (LUTS)   . Lack of coordination   . Lumbago   . Nausea and vomiting 11/30/2016  . Osteoarthrosis, unspecified whether generalized or localized, unspecified site   . Other and unspecified hyperlipidemia   . Other malaise and fatigue   . Pneumonia   . Reflux esophagitis   . Skin cancer   . Sleep apnea    does not wear CPAP  . Syncope and collapse   . Urinary frequency   . Vitamin D deficiency    Social History   Social History  . Marital status: Divorced    Spouse name: N/A  . Number of children: N/A  . Years of education: N/A   Occupational History  . Retired    Social History Main Topics  . Smoking status: Former Smoker    Packs/day: 1.00    Years: 30.00    Types: Cigarettes    Quit date: 08/12/2016  . Smokeless tobacco: Never Used     Comment: 3 per day  . Alcohol use No  . Drug use: No  . Sexual activity: Not Currently   Other Topics Concern  . None   Social History Narrative   Lives alone in apartment in [place]. Son lives locally.    Family History  Problem Relation Age of Onset  . Heart disease Father   . Cancer Father   . Cancer  Brother   . Cancer Brother    Scheduled Meds: . apixaban  5 mg Oral BID  . aspirin EC  81 mg Oral Daily  . dexamethasone  4 mg Intravenous Q6H  . escitalopram  5 mg Oral Daily  . gabapentin  300 mg Oral TID  . multivitamin with minerals  1 tablet Oral Daily  . omega-3 acid ethyl esters  2 g Oral Daily  . pantoprazole  20 mg Oral Daily  . QUEtiapine  25 mg Oral QHS  . simvastatin  20 mg Oral q1800  . sodium chloride flush  3 mL Intravenous Q12H  . sodium chloride flush  3 mL Intravenous Q12H  . tamsulosin  0.4 mg  Oral Daily   Continuous Infusions: . sodium chloride 250 mL (08/14/17 0018)  . ampicillin (OMNIPEN) IV Stopped (08/14/17 6734)  . lactated ringers 10 mL/hr at 08/13/17 1514   PRN Meds:.sodium chloride, acetaminophen **OR** acetaminophen, ketorolac, ondansetron **OR** ondansetron (ZOFRAN) IV, oxyCODONE-acetaminophen, sodium chloride flush Medications Prior to Admission:  Prior to Admission medications   Medication Sig Start Date End Date Taking? Authorizing Provider  apixaban (ELIQUIS) 5 MG TABS tablet Take 5 mg by mouth 2 (two) times daily.   Yes [provider]  aspirin 81 MG tablet Take 81 mg by mouth daily. Take one tablet once a day   Yes [provider]  escitalopram (LEXAPRO) 5 MG tablet TAKE 1 TABLET(5 MG) BY MOUTH EVERY MORNING 06/15/17  Yes Rainey Pines, MD  fish oil-omega-3 fatty acids 1000 MG capsule Take 2 g by mouth daily.    Yes [provider]  gabapentin (NEURONTIN) 300 MG capsule Take 1 capsule (300 mg total) by mouth 3 (three) times daily. 04/08/17  Yes Christian Chandler, NP  Glucosamine-Chondroitin (GLUCOSAMINE CHONDR COMPLEX PO) Take 1 tablet by mouth daily.   Yes [provider]  Glycerin-Polysorbate 80 (REFRESH DRY EYE THERAPY OP) Place 1 drop into both eyes as needed (dry eye).   Yes [provider]  Multiple Vitamins-Minerals (EYE VITAMINS) CAPS Take 1 capsule by mouth daily.   Yes [provider]    Multiple Vitamins-Minerals (MULTIVITAMIN WITH MINERALS) tablet Take 1 tablet by mouth daily.   Yes [provider]  oxyCODONE-acetaminophen (PERCOCET/ROXICET) 5-325 MG tablet Take 1 tablet by mouth every 6 (six) hours as needed for severe pain. 08/06/17  Yes Curcio, Roselie Awkward, NP  pantoprazole (PROTONIX) 20 MG tablet TAKE 1 TABLET(20 MG) BY MOUTH DAILY 06/09/17  Yes Christian Chandler, NP  QUEtiapine (SEROQUEL) 25 MG tablet Take 1 tablet (25 mg total) by mouth at bedtime. 06/15/17  Yes Rainey Pines, MD  simvastatin (ZOCOR) 20 MG tablet TAKE 1 TABLET(20 MG) BY MOUTH DAILY 03/11/17  Yes Estill Dooms, MD  carvedilol (COREG) 3.125 MG tablet Take 1 tablet (3.125 mg total) by mouth 2 (two) times daily with a meal. Patient not taking: Reported on 08/12/2017 07/15/17   Allie Bossier, MD  tamsulosin (FLOMAX) 0.4 MG CAPS capsule TAKE 1 CAPSULE(0.4 MG) BY MOUTH DAILY 06/09/17   Christian Chandler, NP   Allergies  Allergen Reactions  . Chantix [Varenicline] Other (See Comments)    Makes patient suicidal  . Zoloft [Sertraline Hcl] Other (See Comments)    Makes patient suicidal   Review of Systems +pain in back +weakness in R leg  Physical Exam Weak appearing elderly gentleman sitting up in bed S1-S2 Clear breath sounds Abdomen soft Thin, muscle wasting Chronic vision changes, Somewhat hard of hearing Awake alert oriented  Vital Signs: BP 113/67   Pulse 90   Temp 97.9 F (36.6 C) (Oral)   Resp 18   Ht _0  (1.778 m)   Wt 72.6 kg (160 lb)   SpO2 93%   BMI 22.96 kg/m  Pain Assessment: 0-10   Pain Score: 1    SpO2: SpO2: 93 % O2 Device:SpO2: 93 % O2 Flow Rate: .O2 Flow Rate (L/min): 2 L/min  IO: Intake/output summary:  Intake/Output Summary (Last 24 hours) at 08/14/17 1036 Last data filed at 08/14/17 0230  Gross per 24 hour  Intake              850 ml  Output  501 ml  Net              349 ml   PPS 40% LBM: Last BM Date: 08/12/17 Baseline Weight:  Weight: 72.6 kg (160 lb) Most recent weight: Weight: 72.6 kg (160 lb)     Palliative Assessment/Data:   Flowsheet Rows     Most Recent Value  Intake Tab  Referral Department  Hospitalist  Unit at Time of Referral  Cardiac/Telemetry Unit  Palliative Care Primary Diagnosis  Cancer  Palliative Care Type  New Palliative care  Reason for referral  Pain, Clarify Goals of Care, Counsel Regarding Hospice  Date first seen by Palliative Care  08/14/17  Clinical Assessment  Palliative Performance Scale Score  30%  Pain Max last 24 hours  6  Pain Min Last 24 hours  5  Dyspnea Max Last 24 Hours  4  Dyspnea Min Last 24 hours  3  Nausea Max Last 24 Hours  2  Nausea Min Last 24 Hours  1  Anxiety Max Last 24 Hours  5  Anxiety Min Last 24 Hours  4  Psychosocial & Spiritual Assessment  Palliative Care Outcomes  Patient/Family meeting held?  Yes  Who was at the meeting?  patient and son.   Palliative Care Outcomes  Clarified goals of care, Improved pain interventions      Time In:  10 Time Out:  11.10 Time Total:  70 min  Greater than 50%  of this time was spent counseling and coordinating care related to the above assessment and plan.  Signed by: Loistine Chance, MD  [phone]  Please contact Palliative Medicine Team phone at [phone] for questions and concerns.  For individual provider: See Shea Evans

## 2017-08-15 ENCOUNTER — Other Ambulatory Visit: Payer: Self-pay

## 2017-08-15 LAB — URINE CULTURE
Culture: >=100000 — AB
Special Requests: NORMAL

## 2017-08-15 MED ORDER — HYDROMORPHONE HCL 1 MG/ML IJ SOLN
1.0000 mg | INTRAMUSCULAR | Status: DC | PRN
  Administered 2017-08-15 – 2017-08-21 (×6): 1 mg via INTRAVENOUS
  Filled 2017-08-15 (×7): qty 1

## 2017-08-15 MED ORDER — MORPHINE SULFATE 15 MG PO TABS
15.0000 mg | ORAL_TABLET | ORAL | Status: DC | PRN
  Administered 2017-08-15 – 2017-08-21 (×13): 15 mg via ORAL
  Filled 2017-08-15 (×14): qty 1

## 2017-08-15 MED ORDER — KETOROLAC TROMETHAMINE 30 MG/ML IJ SOLN: 15.0000 mg | Freq: Four times a day (QID) | INTRAMUSCULAR | Status: DC | PRN

## 2017-08-15 NOTE — Progress Notes (Signed)
PROGRESS NOTE    [facility]oz  WGN:[id number] DOB: [birth year] DOA: 08/12/2017 PCP: [name], NP  Brief Narrative: Christian Munoz is a 82 y.o. male with medical history significant of Stage 3A small cell lung cancer completed concurrent chemotherapy/radiation in June 2018), atrial fibrillation and pulmonary embolism 1 month ago currently on Eliquis, balance difficulties over the past 2 weeks with falling from a sitting position on 2 occasions at home comes in with calf pain right leg pain and severe back pain.  His right leg is more of a numbness and a weakness.  This is gotten worse over the past 2 weeks and was relieved by nothing, he was admitted for further workup. Finally MRI after multiple attempts was completed under anesthesia last evening, noted extensive extraosseous epidural tumor extending through the tobacco lumbar spine, severe central canal stenosis at some levels, and foraminal stenosis at others, no evidence of spinal cord compression noted on MRI  Assessment & Plan:   1. Back pain/leg pain and leg weakness  -secondary to extensive thoracolumbar extraosseous epidural tumor, no spinal cord compression -Continue IV Decadron 4 mg every 12 for symptom relief -Patient with known stage IIIa small cell lung cancer status post concurrent chemoradiation in June 2 0 18 subsequently under observation per Dr. Julien Nordmann, now stage IV disease based on above MRI findings -Radiation oncology consult by Dr. Lisbeth Renshaw greatly appreciated, plan for XRT simulation and treatment from tomorrow -Will transfer to [facility] hospital tomorrow morning -Pain suboptimally controlled, we'll add morphine IR 60milligrams every 4 hours and IV Dilaudid when necessary in addition to lidocaine patch and gabapentin , physical therapy evaluation -Palliative consult requested per patient request, consult appreciated  2.  Acute lower urinary tract infection:  Recently completed multiple antibiotic  courses, urine culture from 10/10 with enterococcus sensitive to ampicillin and vancomycin  - continue IV ampicillin to complete 3 day course -Cultures with enterococcus again  3. Falls in the past 2 weeks:  -Due to problem #1  -Await physical therapy evaluation   4.  Chronic atrial fibrillation: -  resumed Eliquis - in sinus rhythm, rate controlled at this time   7.stage IV non-small cell lung cancer  - previously was stage IIIa status post chemoradiation in June 2 018  - now metastatic with above MRI findings  - radiation oncology consult, will notify Dr. Julien Nordmann on Monday  - palliative consulted yesterday per patient request   7.  Hyperlipidemia: Continue home medications.  8.  Hypertension: Continue home regimen.  9.  Benign prostatic hyperplasia with urinary obstruction: Likely cause of patient's recurrent urinary tract infection in addition to requiring complex antibiotic management due to having enterococcus.  10.  Goals of care counseling and discussion: -DO NOT RESUSCITATE, palliative following   DVT prophylaxis: On Eliquis Code Status: DNR. Family Communication: son Vicente Males at bedside Disposition Plan: may need SNF, transfer to [facility] long hospital tomorrow  Consultants:   Palliative care  Radiation oncology/Dr. Lisbeth Renshaw   Procedures:   Antimicrobials:    Subjective: -has severe pain in the low backand right upper thigh -Much sleep last night, no nausea or vomiting  Objective: Vitals:   08/14/17 0433 08/14/17 1240 08/14/17 2128 08/15/17 0435  BP: 113/67 130/65 134/76 (!) 159/84  Pulse: 90 88 79 74  Resp: 18 17 18 19   Temp: 97.9 F (36.6 C) 98.4 F (36.9 C) 97.8 F (36.6 C) 98.5 F (36.9 C)  TempSrc: Oral Oral Oral   SpO2: 93% 92% 95% 95%  Weight:  Height:        Intake/Output Summary (Last 24 hours) at 08/15/2017 1136 Last data filed at 08/15/2017 0820 Gross per 24 hour  Intake 971 ml  Output 1750 ml  Net -779 ml   Filed Weights     08/13/17 1513  Weight: 72.6 kg (160 lb 0 oz)    Examination:  Gen: Awake, Alert, Oriented X 3, thinly built male, no distress HEENT: PERRLA, Neck supple, no JVD Lungs: Good air movement bilaterally, CTAB CVS: RRR,No Gallops,Rubs or new Murmurs Abd: soft, Non tender, non distended, BS present Extremities: No Cyanosis, Clubbing or edema Skin: no new rashes Neuro: cranial nerves and higher functions intact,  right lower extremity is 4 out of 5 strength, left lower extremity is 5 out of 5 slightly increased hyperesthesia and right high and lower leg, upper extremities and cranial nerves unremarkable Psychiatric, appropriate mood and affect    Data Reviewed:   CBC: Recent Labs  Lab 08/12/17 2129 08/14/17 0631  WBC 7.2 7.4  NEUTROABS 6.0  --   HGB 9.2* 8.6*  HCT 30.6* 28.7*  MCV 84.3 84.2  PLT 192 578   Basic Metabolic Panel: Recent Labs  Lab 08/12/17 2129 08/14/17 0631  NA 137 136  K 4.0 4.3  CL 101 99*  CO2 25 24  GLUCOSE 86 130*  BUN 19 20  CREATININE 1.03 1.09  CALCIUM 9.3 8.8*   GFR: Estimated Creatinine Clearance: 55.5 mL/min (by C-G formula based on SCr of 1.09 mg/dL). Liver Function Tests: No results for input(s): AST, ALT, ALKPHOS, BILITOT, PROT, ALBUMIN in the last 168 hours. No results for input(s): LIPASE, AMYLASE in the last 168 hours. No results for input(s): AMMONIA in the last 168 hours. Coagulation Profile: No results for input(s): INR, PROTIME in the last 168 hours. Cardiac Enzymes: No results for input(s): CKTOTAL, CKMB, CKMBINDEX, TROPONINI in the last 168 hours. BNP (last 3 results) No results for input(s): PROBNP in the last 8760 hours. HbA1C: No results for input(s): HGBA1C in the last 72 hours. CBG: No results for input(s): GLUCAP in the last 168 hours. Lipid Profile: No results for input(s): CHOL, HDL, LDLCALC, TRIG, CHOLHDL, LDLDIRECT in the last 72 hours. Thyroid Function Tests: No results for input(s): TSH, T4TOTAL, FREET4,  T3FREE, THYROIDAB in the last 72 hours. Anemia Panel: No results for input(s): VITAMINB12, FOLATE, FERRITIN, TIBC, IRON, RETICCTPCT in the last 72 hours. Urine analysis:    Component Value Date/Time   COLORURINE YELLOW 08/12/2017 1842   APPEARANCEUR CLOUDY (A) 08/12/2017 1842   LABSPEC 1.011 08/12/2017 1842   LABSPEC 1.005 01/12/2017 1239   PHURINE 7.0 08/12/2017 1842   GLUCOSEU NEGATIVE 08/12/2017 1842   GLUCOSEU Negative 01/12/2017 1239   HGBUR NEGATIVE 08/12/2017 1842   BILIRUBINUR NEGATIVE 08/12/2017 1842   BILIRUBINUR neg 07/21/2017 1542   BILIRUBINUR Negative 01/12/2017 1239   KETONESUR 5 (A) 08/12/2017 1842   PROTEINUR 30 (A) 08/12/2017 1842   UROBILINOGEN 0.2 07/21/2017 1542   UROBILINOGEN 0.2 01/12/2017 1239   NITRITE NEGATIVE 08/12/2017 1842   LEUKOCYTESUR LARGE (A) 08/12/2017 1842   LEUKOCYTESUR Negative 01/12/2017 1239   Sepsis Labs: @LABRCNTIP (procalcitonin:4,lacticidven:4)  ) Recent Results (from the past 240 hour(s))  Urine culture     Status: Abnormal   Collection Time: 08/13/17  2:15 AM  Result Value Ref Range Status   Specimen Description URINE, CLEAN CATCH  Final   Special Requests Normal  Final   Culture >=100,000 COLONIES/mL ENTEROCOCCUS FAECALIS (A)  Final   Report Status 08/15/2017  FINAL  Final   Organism ID, Bacteria ENTEROCOCCUS FAECALIS (A)  Final      Susceptibility   Enterococcus faecalis - MIC*    AMPICILLIN <=2 SENSITIVE Sensitive     LEVOFLOXACIN 1 SENSITIVE Sensitive     NITROFURANTOIN 32 SENSITIVE Sensitive     VANCOMYCIN 2 SENSITIVE Sensitive     * >=100,000 COLONIES/mL ENTEROCOCCUS FAECALIS         Radiology Studies: Mr Thoracic Spine W Wo Contrast  Result Date: 08/13/2017 CLINICAL DATA:  Small cell lung cancer. Increased falls over the last 2 weeks. Right lower extremity pain and severe low back pain. Progressive numbness and weakness in the right lower extremity. Unable to walk. EXAM: MRI THORACIC AND LUMBAR SPINE WITHOUT AND  WITH CONTRAST TECHNIQUE: Multiplanar and multiecho pulse sequences of the thoracic and lumbar spine were obtained without and with intravenous contrast. CONTRAST:  53mL MULTIHANCE GADOBENATE DIMEGLUMINE 529 MG/ML IV SOLN COMPARISON:  Lumbar spine radiographs 08/12/2017. PET scan 12/03/2016. FINDINGS: MRI THORACIC SPINE FINDINGS Alignment: AP alignment is anatomic. Rightward curvature of the thoracic spine is centered at T7-8. Vertebrae: Diffuse osseous metastatic disease is present. There is involvement of posterior elements on the right at T1 and bilaterally at T2. There is diffuse involvement of vertebral bodies throughout the remainder of the thoracic spine. Cord:  Normal signal is present throughout the thoracic spinal cord Paraspinal and other soft tissues: The left infrahilar mass is increased. There is further collapse or metastatic disease into the left lung. Dependent right low lower lobe atelectasis is present. Bilateral effusions are noted, right greater than left. Numerous cystic lesions are again noted in the liver. Disc levels: T1-2: Facet hypertrophy contributes to foraminal narrowing, right greater than left. T2-3: Mild facet hypertrophy is present on the left without significant stenosis. T3-4:  No significant disc disease or stenosis. T4-5: Mild facet hypertrophy is present bilaterally without focal stenosis. T5-6: Facet hypertrophy is present bilaterally without focal stenosis. T6-7: Facet hypertrophy is present. Extraosseous tumor is present on the right without foraminal invasion. T7-8: Extraosseous tumor extends into the right neural foramen with moderate right foraminal stenosis. The left foramen is patent. T8-9: Extraosseous tumor is noted beyond the left foramen. Facet hypertrophy contributes to mild foraminal narrowing, left greater than right. T9-10: Disc disease and facet hypertrophy contributes to mild foraminal narrowing bilaterally. T10-11: Disc disease and facet hypertrophy  contributes to mild right foraminal narrowing. A perineural root sleeve cyst is present on the left. T11-12: Metastatic disease involves the right rib proximally without focal stenosis. Facet spurring contributes to mild left foraminal narrowing. A perineural root sleeve cyst is present on the right. T12-L1: Schmorl's nodes are present without significant stenosis. MRI LUMBAR SPINE FINDINGS Segmentation:  Transitional anatomy is present at S1. Alignment: Degenerative anterolisthesis is present at S1-2 and L5-S1. There is slight degenerative retrolisthesis at L4-5. Vertebrae: Extensive osseous metastatic disease is present throughout the lumbar spine and sacrum. Conus medullaris: Extends to the L1 level and appears normal. Paraspinal and other soft tissues: Extraosseous tumor is noted on the right at S1. This likely impacts the right S1 foramen anteriorly. There is distention of the urinary bladder with wall thickening. This may be neurogenic. Disc levels: Diffuse extraosseous tumor is present in the posterior epidural space from L2-3 through L3-4. The enhancing soft tissue mass measures 4.6 x 0.8 cm on the sagittal images. The tumor is 1.3 cm wide. This results in severe central canal stenosis at L3. The canal is narrowed to 3 mm  in the midline. L1-2: Facet hypertrophy contributes to mild foraminal narrowing bilaterally, worse on the right. L2-3: A broad-based disc protrusion is present. Mild facet hypertrophy is noted. There is no significant stenosis at this level. L3-4: In addition to tumor, a broad-based disc protrusion and facet hypertrophy contribute to moderate central canal stenosis. Moderate foraminal narrowing is worse on the left. L4-5: Extraosseous tumor extends into the right neural foramen resulting in severe right foraminal stenosis. Moderate left foraminal narrowing is due to disc disease and facet hypertrophy. Tumor also contributes to moderate right subarticular stenosis. L5-S1: A broad-based disc  protrusion is present. Advanced facet hypertrophy is noted. This results an moderate subarticular stenosis bilaterally. Moderate left and mild right foraminal narrowing is present. Extraosseous tumor extends from the posterior elements into the right posterolateral aspect of the canal the superior right foramen. S1-2: Anterolisthesis results in uncovering of the disc with right greater than left subarticular and foraminal stenosis. IMPRESSION: 1. 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 The posterior tumor at L2-3 measures 4.6 x 0.8 x 1.3 cm. 2. Extraosseous tumor extends into the right foramen at L4-5 with severe right foraminal stenosis. 3. Extraosseous tumor and degenerative change contribute to moderate right subarticular and mild right foraminal stenosis at L5-S1. 4. Typical degenerative changes are also present in the lumbar spine resulting in moderate foraminal narrowing bilaterally at L3-4, left greater than right. 5. Degenerate changes contribute to moderate left foraminal narrowing at L4-5. 6. Degenerative changes contribute to moderate left subarticular and foraminal narrowing at L5-S1. 7. Degenerative subarticular and foraminal narrowing at S1-2 is worse on the right. Prominent extraosseous tumor from the right sacral ala extends into the ventral soft tissues and likely impacts the ventral S1 foramen. 8. Diffuse osseous metastatic disease throughout the thoracic and lumbar spine. 9. Extraosseous tumor extends into the right neural foramen at T7-8 with moderate right foraminal stenosis. 10. Extraosseous tumor is noted just beyond the left foramen at T8-9. This could impact the left T8 nerve root. 11. No other focal stenosis of the thoracic spine due to tumor. 12. Multilevel degenerative change and foraminal narrowing in the thoracic spine due to the endplate change and facet disease. Critical Value/emergent results were called by telephone at the time of  interpretation on 08/13/2017 at 6:34 pm to Dr. Randa Spike , who verbally acknowledged these results. Electronically Signed   By: San Morelle M.D.   On: 08/13/2017 18:55   Mr Lumbar Spine W Wo Contrast  Result Date: 08/13/2017 CLINICAL DATA:  Small cell lung cancer. Increased falls over the last 2 weeks. Right lower extremity pain and severe low back pain. Progressive numbness and weakness in the right lower extremity. Unable to walk. EXAM: MRI THORACIC AND LUMBAR SPINE WITHOUT AND WITH CONTRAST TECHNIQUE: Multiplanar and multiecho pulse sequences of the thoracic and lumbar spine were obtained without and with intravenous contrast. CONTRAST:  65mL MULTIHANCE GADOBENATE DIMEGLUMINE 529 MG/ML IV SOLN COMPARISON:  Lumbar spine radiographs 08/12/2017. PET scan 12/03/2016. FINDINGS: MRI THORACIC SPINE FINDINGS Alignment: AP alignment is anatomic. Rightward curvature of the thoracic spine is centered at T7-8. Vertebrae: Diffuse osseous metastatic disease is present. There is involvement of posterior elements on the right at T1 and bilaterally at T2. There is diffuse involvement of vertebral bodies throughout the remainder of the thoracic spine. Cord:  Normal signal is present throughout the thoracic spinal cord Paraspinal and other soft tissues: The left infrahilar mass is increased. There is further collapse or  metastatic disease into the left lung. Dependent right low lower lobe atelectasis is present. Bilateral effusions are noted, right greater than left. Numerous cystic lesions are again noted in the liver. Disc levels: T1-2: Facet hypertrophy contributes to foraminal narrowing, right greater than left. T2-3: Mild facet hypertrophy is present on the left without significant stenosis. T3-4:  No significant disc disease or stenosis. T4-5: Mild facet hypertrophy is present bilaterally without focal stenosis. T5-6: Facet hypertrophy is present bilaterally without focal stenosis. T6-7: Facet hypertrophy is  present. Extraosseous tumor is present on the right without foraminal invasion. T7-8: Extraosseous tumor extends into the right neural foramen with moderate right foraminal stenosis. The left foramen is patent. T8-9: Extraosseous tumor is noted beyond the left foramen. Facet hypertrophy contributes to mild foraminal narrowing, left greater than right. T9-10: Disc disease and facet hypertrophy contributes to mild foraminal narrowing bilaterally. T10-11: Disc disease and facet hypertrophy contributes to mild right foraminal narrowing. A perineural root sleeve cyst is present on the left. T11-12: Metastatic disease involves the right rib proximally without focal stenosis. Facet spurring contributes to mild left foraminal narrowing. A perineural root sleeve cyst is present on the right. T12-L1: Schmorl's nodes are present without significant stenosis. MRI LUMBAR SPINE FINDINGS Segmentation:  Transitional anatomy is present at S1. Alignment: Degenerative anterolisthesis is present at S1-2 and L5-S1. There is slight degenerative retrolisthesis at L4-5. Vertebrae: Extensive osseous metastatic disease is present throughout the lumbar spine and sacrum. Conus medullaris: Extends to the L1 level and appears normal. Paraspinal and other soft tissues: Extraosseous tumor is noted on the right at S1. This likely impacts the right S1 foramen anteriorly. There is distention of the urinary bladder with wall thickening. This may be neurogenic. Disc levels: Diffuse extraosseous tumor is present in the posterior epidural space from L2-3 through L3-4. The enhancing soft tissue mass measures 4.6 x 0.8 cm on the sagittal images. The tumor is 1.3 cm wide. This results in severe central canal stenosis at L3. The canal is narrowed to 3 mm in the midline. L1-2: Facet hypertrophy contributes to mild foraminal narrowing bilaterally, worse on the right. L2-3: A broad-based disc protrusion is present. Mild facet hypertrophy is noted. There is no  significant stenosis at this level. L3-4: In addition to tumor, a broad-based disc protrusion and facet hypertrophy contribute to moderate central canal stenosis. Moderate foraminal narrowing is worse on the left. L4-5: Extraosseous tumor extends into the right neural foramen resulting in severe right foraminal stenosis. Moderate left foraminal narrowing is due to disc disease and facet hypertrophy. Tumor also contributes to moderate right subarticular stenosis. L5-S1: A broad-based disc protrusion is present. Advanced facet hypertrophy is noted. This results an moderate subarticular stenosis bilaterally. Moderate left and mild right foraminal narrowing is present. Extraosseous tumor extends from the posterior elements into the right posterolateral aspect of the canal the superior right foramen. S1-2: Anterolisthesis results in uncovering of the disc with right greater than left subarticular and foraminal stenosis. IMPRESSION: 1. 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 The posterior tumor at L2-3 measures 4.6 x 0.8 x 1.3 cm. 2. Extraosseous tumor extends into the right foramen at L4-5 with severe right foraminal stenosis. 3. Extraosseous tumor and degenerative change contribute to moderate right subarticular and mild right foraminal stenosis at L5-S1. 4. Typical degenerative changes are also present in the lumbar spine resulting in moderate foraminal narrowing bilaterally at L3-4, left greater than right. 5. Degenerate changes contribute to moderate left foraminal  narrowing at L4-5. 6. Degenerative changes contribute to moderate left subarticular and foraminal narrowing at L5-S1. 7. Degenerative subarticular and foraminal narrowing at S1-2 is worse on the right. Prominent extraosseous tumor from the right sacral ala extends into the ventral soft tissues and likely impacts the ventral S1 foramen. 8. Diffuse osseous metastatic disease throughout the thoracic and lumbar  spine. 9. Extraosseous tumor extends into the right neural foramen at T7-8 with moderate right foraminal stenosis. 10. Extraosseous tumor is noted just beyond the left foramen at T8-9. This could impact the left T8 nerve root. 11. No other focal stenosis of the thoracic spine due to tumor. 12. Multilevel degenerative change and foraminal narrowing in the thoracic spine due to the endplate change and facet disease. Critical Value/emergent results were called by telephone at the time of interpretation on 08/13/2017 at 6:34 pm to Dr. Randa Spike , who verbally acknowledged these results. Electronically Signed   By: San Morelle M.D.   On: 08/13/2017 18:55        Scheduled Meds: . apixaban  5 mg Oral BID  . aspirin EC  81 mg Oral Daily  . dexamethasone  4 mg Intravenous Q12H  . escitalopram  5 mg Oral Daily  . gabapentin  300 mg Oral TID  . lidocaine  1 patch Transdermal Q24H  . multivitamin with minerals  1 tablet Oral Daily  . omega-3 acid ethyl esters  2 g Oral Daily  . pantoprazole  20 mg Oral Daily  . QUEtiapine  25 mg Oral QHS  . simvastatin  20 mg Oral q1800  . sodium chloride flush  3 mL Intravenous Q12H  . sodium chloride flush  3 mL Intravenous Q12H  . tamsulosin  0.4 mg Oral Daily   Continuous Infusions: . sodium chloride 250 mL (08/14/17 0018)  . ampicillin (OMNIPEN) IV Stopped (08/15/17 0544)  . lactated ringers 10 mL/hr at 08/13/17 1514     LOS: 1 day    Time spent: 61min    Domenic Polite, MD [facility] Page via [email or web address], password [email or web address] After 7PM please contact night-coverage  08/15/2017, 11:36 AM

## 2017-08-16 ENCOUNTER — Telehealth: Payer: Self-pay | Admitting: Oncology

## 2017-08-16 ENCOUNTER — Ambulatory Visit
Admit: 2017-08-16 | Discharge: 2017-08-16 | Disposition: A | Discharge status: Home / Self Care | Payer: Medicare Other | Attending: Radiation Oncology | Admitting: Radiation Oncology

## 2017-08-16 ENCOUNTER — Ambulatory Visit: Payer: Medicare Other | Admitting: Psychiatry

## 2017-08-16 ENCOUNTER — Ambulatory Visit
Admit: 2017-08-16 | Discharge: 2017-08-16 | Disposition: A | Discharge status: Home / Self Care | Payer: Medicare Other | Source: Ambulatory Visit | Attending: Radiation Oncology | Admitting: Radiation Oncology

## 2017-08-16 ENCOUNTER — Encounter (HOSPITAL_COMMUNITY): Payer: Self-pay | Admitting: Radiology

## 2017-08-16 DIAGNOSIS — Z510 Encounter for antineoplastic radiation therapy: Secondary | ICD-10-CM | POA: Insufficient documentation

## 2017-08-16 DIAGNOSIS — C801 Malignant (primary) neoplasm, unspecified: Secondary | ICD-10-CM | POA: Insufficient documentation

## 2017-08-16 DIAGNOSIS — C7951 Secondary malignant neoplasm of bone: Secondary | ICD-10-CM | POA: Insufficient documentation

## 2017-08-16 LAB — CBC
HEMATOCRIT: 28.8 % — AB (ref 39.0–52.0)
HEMOGLOBIN: 8.6 g/dL — AB (ref 13.0–17.0)
MCH: 25.4 pg — ABNORMAL LOW (ref 26.0–34.0)
MCHC: 29.9 g/dL — ABNORMAL LOW (ref 30.0–36.0)
MCV: 85.2 fL (ref 78.0–100.0)
Platelets: 139 10*3/uL — ABNORMAL LOW (ref 150–400)
RBC: 3.38 MIL/uL — AB (ref 4.22–5.81)
RDW: 19.4 % — ABNORMAL HIGH (ref 11.5–15.5)
WBC: 8.3 10*3/uL (ref 4.0–10.5)

## 2017-08-16 LAB — BASIC METABOLIC PANEL
ANION GAP: 11 (ref 5–15)
BUN: 24 mg/dL — ABNORMAL HIGH (ref 6–20)
CHLORIDE: 101 mmol/L (ref 101–111)
CO2: 27 mmol/L (ref 22–32)
Calcium: 8.9 mg/dL (ref 8.9–10.3)
Creatinine, Ser: 0.99 mg/dL (ref 0.61–1.24)
GFR calc Af Amer: >60 mL/min (ref >60)
GLUCOSE: 136 mg/dL — AB (ref 65–99)
POTASSIUM: 3.7 mmol/L (ref 3.5–5.1)
Sodium: 139 mmol/L (ref 135–145)

## 2017-08-16 MED ORDER — AMPICILLIN 250 MG PO CAPS
250.0000 mg | ORAL_CAPSULE | Freq: Four times a day (QID) | ORAL | Status: DC
  Filled 2017-08-16 (×2): qty 1

## 2017-08-16 MED ORDER — FENTANYL 25 MCG/HR TD PT72
25.0000 ug | MEDICATED_PATCH | TRANSDERMAL | Status: DC
  Administered 2017-08-16 – 2017-08-19 (×2): 25 ug via TRANSDERMAL
  Filled 2017-08-16 (×3): qty 1

## 2017-08-16 MED ORDER — AMOXICILLIN 250 MG PO CAPS
250.0000 mg | ORAL_CAPSULE | Freq: Three times a day (TID) | ORAL | Status: AC
  Administered 2017-08-16 – 2017-08-18 (×8): 250 mg via ORAL
  Filled 2017-08-16 (×8): qty 1

## 2017-08-16 NOTE — Progress Notes (Signed)
  Radiation Oncology         [phone] ________________________________  Name: Josias Tomerlin MRN: [id number]  Date: 08/16/2017  DOB: [birth year]  INPATIENT  SIMULATION AND TREATMENT PLANNING NOTE    ICD-10-CM   1. Spine metastasis ([place]) C79.51     DIAGNOSIS:  82 y.o. yo man patient with lumbar metastasis  NARRATIVE:  The patient was brought to the [facility].  Identity was confirmed.  All relevant records and images related to the planned course of therapy were reviewed.  The patient freely provided informed written consent to proceed with treatment after reviewing the details related to the planned course of therapy. The consent form was witnessed and verified by the simulation staff.  Then, the patient was set-up in a stable reproducible  supine position for radiation therapy.  CT images were obtained.  Surface markings were placed.  The CT images were loaded into the planning software.  Then the target and avoidance structures were contoured including kidneys.  Treatment planning then occurred.  The radiation prescription was entered and confirmed.  Then, I designed and supervised the construction of a total of 3 medically necessary complex treatment devices with VacLoc positioner and 2 MLCs to shield kidneys.  I have requested : 3D Simulation  I have requested a DVH of the following structures: Left Kidney, Right Kidney and target.  PLAN:  The patient will receive 30 Gy in 10 fractions.  ________________________________  Sheral Apley Tammi Klippel, M.D.

## 2017-08-16 NOTE — Progress Notes (Signed)
Pt arrived to the floor from radiation. He transferred from [facility] to be marked for radiation, and then came up to the floor.

## 2017-08-16 NOTE — Progress Notes (Signed)
Patient transported by [facility] to [facility], report given to Oncology Nurse and the unit he is going to be admitted to [facility]. Family aware of transfer and which unit he will be going to. IV will remain intact for transfer. All belongings taken with patient.

## 2017-08-16 NOTE — Telephone Encounter (Signed)
Shawnee Knapp, RN on 6N for report on patient.  Advised her that he needs to be at Radiation Oncology by 11 am this morning for mark and start.  Advised her that [name] has been given a "heads up" but that she needs to call them to arrange transportation.  She said he is alert and oriented, on room air, has a right forearm IV saline locked, has been medicated during the night with morphine, dilaudid and toradol.  He has not had any pain medication this morning.  Vitals stable.  Also asked if he has a room assignment yet at [facility].  She said she is not aware of a room assignment yet.

## 2017-08-16 NOTE — Telephone Encounter (Signed)
Opened in error

## 2017-08-16 NOTE — Progress Notes (Signed)
Pt resting in bed quietly. Easily aroused. C/o bilat hip pain and became irritated when pain score was asked. Pt  is educated on the necessity of utilizing the pain score to assess effectiveness of pain management. Pt's dilaudid administered per request and effective. Continent of b/b with urinal use noted that is 150 ml and is clear yellow. [place]bell within reach. Safety maintained. Will continue to monitor.

## 2017-08-16 NOTE — Progress Notes (Signed)
Daily Progress Note   Patient Name: Christian Munoz       Date: 08/16/2017 DOB: [birth year]  Age: 82 y.o. MRN#: [id number] Attending Physician: Domenic Polite, MD Primary Care Physician: [name], NP Admit Date: 08/12/2017  Reason for Consultation/Follow-up: Establishing goals of care  Subjective:  awake alert Is now at [facility], has met with rad onc, has undergone simulation, to start radiation treatments soon  Son Derek at bedside  See below:   Length of Stay: 2  Current Medications: Scheduled Meds:  . amoxicillin  250 mg Oral Q8H  . apixaban  5 mg Oral BID  . aspirin EC  81 mg Oral Daily  . dexamethasone  4 mg Intravenous Q12H  . escitalopram  5 mg Oral Daily  . fentaNYL  25 mcg Transdermal Q72H  . gabapentin  300 mg Oral TID  . lidocaine  1 patch Transdermal Q24H  . multivitamin with minerals  1 tablet Oral Daily  . omega-3 acid ethyl esters  2 g Oral Daily  . pantoprazole  20 mg Oral Daily  . QUEtiapine  25 mg Oral QHS  . simvastatin  20 mg Oral q1800  . sodium chloride flush  3 mL Intravenous Q12H  . sodium chloride flush  3 mL Intravenous Q12H  . tamsulosin  0.4 mg Oral Daily    Continuous Infusions: . sodium chloride 250 mL (08/14/17 0018)  . lactated ringers 10 mL/hr at 08/13/17 1514    PRN Meds: sodium chloride, acetaminophen **OR** acetaminophen, HYDROmorphone (DILAUDID) injection, morphine, ondansetron **OR** ondansetron (ZOFRAN) IV, sodium chloride flush PPS 40%  Physical Exam         Awake alert Still with severe ongoing weakness in RLE Clear Abdomen soft S1 S2 Pain in back Mood congruent  Vital Signs: BP (!) 148/72 (BP Location: Left Arm)   Pulse 78   Temp 97.9 F (36.6 C) (Oral)   Resp 18   Ht 5' 10"  (1.778 m)   Wt 72.6 kg  (160 lb 0 oz)   SpO2 91%   BMI 22.96 kg/m  SpO2: SpO2: 91 % O2 Device: O2 Device: Not Delivered O2 Flow Rate: O2 Flow Rate (L/min): 2 L/min  Intake/output summary:   Intake/Output Summary (Last 24 hours) at 08/16/2017 1405 Last data filed at 08/16/2017 1131 Gross per 24 hour  Intake 1147 ml  Output 550 ml  Net 597 ml   LBM: Last BM Date: 08/14/17 Baseline Weight: Weight: 72.6 kg (160 lb 0 oz) Most recent weight: Weight: 72.6 kg (160 lb 0 oz)       Palliative Assessment/Data:    Flowsheet Rows     Most Recent Value  Intake Tab  Referral Department  Hospitalist  Unit at Time of Referral  Cardiac/Telemetry Unit  Palliative Care Primary Diagnosis  Cancer  Palliative Care Type  New Palliative care  Reason for referral  Pain, Clarify Goals of Care, Counsel Regarding Hospice  Date first seen by Palliative Care  08/14/17  Clinical Assessment  Palliative Performance Scale Score  30%  Pain Max last 24 hours  6  Pain Min Last 24 hours  5  Dyspnea Max Last 24 Hours  4  Dyspnea Min Last 24 hours  3  Nausea Max Last 24 Hours  2  Nausea Min Last 24 Hours  1  Anxiety Max Last 24 Hours  5  Anxiety Min Last 24 Hours  4  Psychosocial & Spiritual Assessment  Palliative Care Outcomes  Patient/Family meeting held?  Yes  Who was at the meeting?  patient and son.   Palliative Care Outcomes  Clarified goals of care, Improved pain interventions      Patient Active Problem List   Diagnosis Date Noted  . Spine metastasis ([place]) 08/16/2017  . Fall   . Radicular leg pain   . Encounter for palliative care   . Leg weakness 08/13/2017  . Acute back pain less than 4 weeks duration 08/13/2017  . Acute lower UTI   . Small cell lung cancer in adult [facility])   . Anemia   . Hypoxemia   . Pulmonary embolism ([place]) 07/13/2017  . Atrial fibrillation, chronic ([place]) 07/13/2017  . Lower urinary tract infectious disease 07/13/2017  . Chronic pain 07/13/2017  . Antineoplastic chemotherapy induced  anemia 03/01/2017  . Cancer associated pain 02/08/2017  . Dysuria 01/18/2017  . Fever 01/13/2017  . Port catheter in place 01/12/2017  . Small cell lung cancer, left ([place]) 11/30/2016  . Goals of care, counseling/discussion 11/30/2016  . Encounter for antineoplastic chemotherapy 11/30/2016  . History of fall 02/28/2016  . Hypertension 12/04/2014  . Benign prostatic hyperplasia with urinary obstruction 12/04/2014  . Depression 12/04/2014  . Hyperlipidemia 01/06/2013  . Reflux esophagitis   . Osteoarthrosis, unspecified whether generalized or localized, unspecified site     Palliative Care Assessment & Plan   Patient Profile:   Assessment:  small cell lung cancer Extensive thoraco lumbar osseous metastatic burden Uncontrolled pain Weakness in RLE UTI Has chronic A fib BPH History of falls and weakness lately.   Recommendations/Plan: Agree with PT evaluation To start radiation soon Agree with current pain and adjuvant medication regimen Recommend SNF rehab with palliative services following on discharge.  Continue supportive care Request CSW input as per son's request    Code Status:    Code Status Orders  (From admission, onward)        Start     Ordered   08/13/17 1216  Do not attempt resuscitation (DNR)  Continuous    Question Answer Comment  In the event of cardiac or respiratory ARREST Do not call a "code blue"   In the event of cardiac or respiratory ARREST Do not perform Intubation, CPR, defibrillation or ACLS   In the event of cardiac or respiratory ARREST Use medication by any route, position, wound care,  and other measures to relive pain and suffering. May use oxygen, suction and manual treatment of airway obstruction as needed for comfort.      08/13/17 1220    Code Status History    Date Active Date Inactive Code Status Order ID Comments User Context   07/13/2017 11:30 07/15/2017 18:13 Full Code [id number]  Waldemar Dickens, MD ED   01/14/2017 13:41  07/13/2017 03:44 DNR [id number]  [name], NP Outpatient   04/04/2015 14:44 10/23/2016 15:07 DNR [id number]  [name], NP Outpatient    Advance Directive Documentation     Most Recent Value  Type of Advance Directive  Healthcare Power of [place], Living will, Out of facility DNR (pink MOST or yellow form)  Pre-existing out of facility DNR order (yellow form or pink MOST form)  Yellow form placed in chart (order not valid for inpatient use)  "MOST" Form in Place?  No data       Prognosis:   guarded   Discharge Planning:  [facility] for rehab with Palliative care service follow-up  Care plan was discussed with  Patient son case management/CSW.  Thank you for allowing the Palliative Medicine Team to assist in the care of this patient.   Time In: 1330 Time Out: 1405 Total Time 35 Prolonged Time Billed  no       Greater than 50%  of this time was spent counseling and coordinating care related to the above assessment and plan.  Loistine Chance, MD [phone]  Please contact Palliative Medicine Team phone at [phone] for questions and concerns.

## 2017-08-16 NOTE — Progress Notes (Signed)
PROGRESS NOTE    Christian Munoz  NWG:[id number] DOB: 1935/02/14 DOA: 08/12/2017 PCP: [name], NP  Brief Narrative: Christian Munoz is a 82 y.o. male with medical history significant of Stage 3A small cell lung cancer completed concurrent chemotherapy/radiation in June 2018), atrial fibrillation and pulmonary embolism 1 month ago currently on Eliquis, balance difficulties over the past 2 weeks with falling from a sitting position on 2 occasions at home comes in with calf pain right leg pain and severe back pain.  His right leg is more of a numbness and a weakness.  This is gotten worse over the past 2 weeks and was relieved by nothing, he was admitted for further workup. Finally MRI after multiple attempts was completed under anesthesia on admission, noted extensive extraosseous epidural tumor extending through the thoraco lumbar spine, severe central canal stenosis at some levels, and foraminal stenosis at others, no evidence of spinal cord compression noted on MRI. Rad Onc consulted, plan for XRT/Simulation today, Tx to [place]:   1. Extensive Thoracolumbar extraosseous epidural metastasis -presenting with back pain/leg pain and leg weakness  -secondary to extensive thoracolumbar extraosseous epidural tumor, no spinal cord compression -Continue IV Decadron 4 mg every 12 for symptom relief -Patient with known stage IIIa small cell lung cancer status post concurrent chemoradiation in June 2 0 18 subsequently under observation per Dr. Julien Nordmann, now stage IV disease based on above MRI findings -Radiation oncology consult by Dr. Lisbeth Renshaw greatly appreciated, plan for XRT simulation and treatment from today -Will transfer to [facility] long hospital this morning -Pain suboptimally controlled, continue morphine IR 78milligrams every 4 hours PRN, add fentanyl patch today -continue lidocaine patch and gabapentin , physical therapy evaluation -Palliative consult requested  per patient request, consult appreciated  2.  Acute lower urinary tract infection:  Recently completed multiple antibiotic courses, urine culture from 10/10 with enterococcus sensitive to ampicillin and vancomycin  -Cultures with enterococcus again -change ampicillin to PO to complete 5day course, Day 3/5 now  3. Falls in the past 2 weeks:  -Due to problem #1  -Await physical therapy evaluation   4.  Chronic atrial fibrillation: -  resumed Eliquis - in sinus rhythm, rate controlled at this time   7.stage IV non-small cell lung cancer  - previously was stage IIIa status post chemoradiation in June 2 018  - now metastatic with above MRI findings  - radiation oncology consult, discussed with Dr. Julien Nordmann this morning - palliative consulted per patient request   7.  Hyperlipidemia: Continue home medications.  8.  Hypertension: Continue home regimen.  9.  Benign prostatic hyperplasia with urinary obstruction: Likely cause of patient's recurrent urinary tract infection in addition to requiring complex antibiotic management due to having enterococcus.  10.  Goals of care counseling and discussion: -DO NOT RESUSCITATE, palliative following   DVT prophylaxis: On Eliquis Code Status: DNR. Family Communication: son Vicente Males at bedside yesterday Disposition Plan: may need SNF, transfer to [facility] long hospital today  Consultants:   Palliative care  Radiation oncology/Dr. Lisbeth Renshaw   Procedures:   Antimicrobials:    Subjective: -pain better controlled on this regimen, requiring meds Q4-5h, slept some, +BM  Objective: Vitals:   08/15/17 0435 08/15/17 1520 08/15/17 2144 08/16/17 0344  BP: (!) 159/84 131/81 (!) 152/88 (!) 148/72  Pulse: 74 86 63 78  Resp: 19 18 20 18   Temp: 98.5 F (36.9 C) 98.6 F (37 C) 98.7 F (37.1 C) 97.9 F (36.6 C)  TempSrc:  Oral Oral Oral  SpO2: 95% 99% 92% 91%  Weight:      Height:        Intake/Output Summary (Last 24 hours) at 08/16/2017  1139 Last data filed at 08/16/2017 1131 Gross per 24 hour  Intake 1286 ml  Output 700 ml  Net 586 ml   Filed Weights   08/13/17 1513  Weight: 72.6 kg (160 lb 0 oz)    Examination:  Gen: Awake, Alert, Oriented X 3, thinly built male, no distress HEENT: PERRLA, Neck supple, no JVD Lungs: Good air movement bilaterally, CTAB CVS: RRR,No Gallops,Rubs or new Murmurs Abd: soft, Non tender, non distended, BS present Extremities: No Cyanosis, Clubbing or edema Skin: no new rashes Neuro: cranial nerves and higher functions intact,  right lower extremity is 4 out of 5 strength, left lower extremity is 5 out of 5 slightly increased hyperesthesia and right high and lower leg, upper extremities and cranial nerves unremarkable Psychiatric, appropriate mood and affect    Data Reviewed:   CBC: Recent Labs  Lab 08/12/17 2129 08/14/17 0631 08/16/17 0613  WBC 7.2 7.4 8.3  NEUTROABS 6.0  --   --   HGB 9.2* 8.6* 8.6*  HCT 30.6* 28.7* 28.8*  MCV 84.3 84.2 85.2  PLT 192 155 128*   Basic Metabolic Panel: Recent Labs  Lab 08/12/17 2129 08/14/17 0631 08/16/17 0613  NA 137 136 139  K 4.0 4.3 3.7  CL 101 99* 101  CO2 25 24 27   GLUCOSE 86 130* 136*  BUN 19 20 24*  CREATININE 1.03 1.09 0.99  CALCIUM 9.3 8.8* 8.9   GFR: Estimated Creatinine Clearance: 61.1 mL/min (by C-G formula based on SCr of 0.99 mg/dL). Liver Function Tests: No results for input(s): AST, ALT, ALKPHOS, BILITOT, PROT, ALBUMIN in the last 168 hours. No results for input(s): LIPASE, AMYLASE in the last 168 hours. No results for input(s): AMMONIA in the last 168 hours. Coagulation Profile: No results for input(s): INR, PROTIME in the last 168 hours. Cardiac Enzymes: No results for input(s): CKTOTAL, CKMB, CKMBINDEX, TROPONINI in the last 168 hours. BNP (last 3 results) No results for input(s): PROBNP in the last 8760 hours. HbA1C: No results for input(s): HGBA1C in the last 72 hours. CBG: No results for input(s):  GLUCAP in the last 168 hours. Lipid Profile: No results for input(s): CHOL, HDL, LDLCALC, TRIG, CHOLHDL, LDLDIRECT in the last 72 hours. Thyroid Function Tests: No results for input(s): TSH, T4TOTAL, FREET4, T3FREE, THYROIDAB in the last 72 hours. Anemia Panel: No results for input(s): VITAMINB12, FOLATE, FERRITIN, TIBC, IRON, RETICCTPCT in the last 72 hours. Urine analysis:    Component Value Date/Time   COLORURINE YELLOW 08/12/2017 1842   APPEARANCEUR CLOUDY (A) 08/12/2017 1842   LABSPEC 1.011 08/12/2017 1842   LABSPEC 1.005 01/12/2017 1239   PHURINE 7.0 08/12/2017 1842   GLUCOSEU NEGATIVE 08/12/2017 1842   GLUCOSEU Negative 01/12/2017 1239   HGBUR NEGATIVE 08/12/2017 1842   BILIRUBINUR NEGATIVE 08/12/2017 1842   BILIRUBINUR neg 07/21/2017 1542   BILIRUBINUR Negative 01/12/2017 1239   KETONESUR 5 (A) 08/12/2017 1842   PROTEINUR 30 (A) 08/12/2017 1842   UROBILINOGEN 0.2 07/21/2017 1542   UROBILINOGEN 0.2 01/12/2017 1239   NITRITE NEGATIVE 08/12/2017 1842   LEUKOCYTESUR LARGE (A) 08/12/2017 1842   LEUKOCYTESUR Negative 01/12/2017 1239   Sepsis Labs: @LABRCNTIP (procalcitonin:4,lacticidven:4)  ) Recent Results (from the past 240 hour(s))  Urine culture     Status: Abnormal   Collection Time: 08/13/17  2:15 AM  Result  Value Ref Range Status   Specimen Description URINE, CLEAN CATCH  Final   Special Requests Normal  Final   Culture >=100,000 COLONIES/mL ENTEROCOCCUS FAECALIS (A)  Final   Report Status 08/15/2017 FINAL  Final   Organism ID, Bacteria ENTEROCOCCUS FAECALIS (A)  Final      Susceptibility   Enterococcus faecalis - MIC*    AMPICILLIN <=2 SENSITIVE Sensitive     LEVOFLOXACIN 1 SENSITIVE Sensitive     NITROFURANTOIN 32 SENSITIVE Sensitive     VANCOMYCIN 2 SENSITIVE Sensitive     * >=100,000 COLONIES/mL ENTEROCOCCUS FAECALIS         Radiology Studies: No results found.      Scheduled Meds: . apixaban  5 mg Oral BID  . aspirin EC  81 mg Oral Daily    . dexamethasone  4 mg Intravenous Q12H  . escitalopram  5 mg Oral Daily  . gabapentin  300 mg Oral TID  . lidocaine  1 patch Transdermal Q24H  . multivitamin with minerals  1 tablet Oral Daily  . omega-3 acid ethyl esters  2 g Oral Daily  . pantoprazole  20 mg Oral Daily  . QUEtiapine  25 mg Oral QHS  . simvastatin  20 mg Oral q1800  . sodium chloride flush  3 mL Intravenous Q12H  . sodium chloride flush  3 mL Intravenous Q12H  . tamsulosin  0.4 mg Oral Daily   Continuous Infusions: . sodium chloride 250 mL (08/14/17 0018)  . ampicillin (OMNIPEN) IV Stopped (08/16/17 0556)  . lactated ringers 10 mL/hr at 08/13/17 1514     LOS: 2 days    Time spent: 37min    Domenic Polite, MD [facility] Page via [email or web address], password [email or web address] After 7PM please contact night-coverage  08/16/2017, 11:39 AM

## 2017-08-16 NOTE — Progress Notes (Signed)
PT Note  Patient Details Name: Evonte Prestage MRN: [id number] DOB: [birth year]     Noted pt with PT eval transferred from [facility] this morning. Will try to see this afternoon if possible, if not, will see tomorrow. Please page if needs before then.    Clide Dales 08/16/2017, 2:24 PM  Clide Dales, PT Pager: [phone] 08/16/2017

## 2017-08-16 NOTE — Progress Notes (Signed)
PT Cancellation Note  Patient Details Name: Christian Munoz MRN: [id number] DOB: January 27, 1935   Cancelled Treatment:     Attempted to eval pt this morning. Per RN, pt is being transported  to [facility] mid morning for continuation of care. Notified acute rehab services at [facility].   Reinaldo Berber, PT, DPT Acute Rehab Services Pager: [phone]   Reinaldo Berber 08/16/2017, 10:54 AM

## 2017-08-17 ENCOUNTER — Ambulatory Visit
Admit: 2017-08-17 | Discharge: 2017-08-17 | Disposition: A | Discharge status: Home / Self Care | Payer: Medicare Other | Attending: Radiation Oncology | Admitting: Radiation Oncology

## 2017-08-17 DIAGNOSIS — G893 Neoplasm related pain (acute) (chronic): Secondary | ICD-10-CM

## 2017-08-17 MED ORDER — LIP MEDEX EX OINT
TOPICAL_OINTMENT | CUTANEOUS | Status: AC
Start: 2017-08-17 — End: 2017-08-17
  Administered 2017-08-17: 21:00:00
  Filled 2017-08-17: qty 7

## 2017-08-17 MED FILL — Succinylcholine Chloride Inj 20 MG/ML: INTRAMUSCULAR | Qty: 10 | Status: AC

## 2017-08-17 MED FILL — Fentanyl Citrate Preservative Free (PF) Inj 100 MCG/2ML: INTRAMUSCULAR | Qty: 2 | Status: AC

## 2017-08-17 MED FILL — Ondansetron HCl Inj 4 MG/2ML (2 MG/ML): INTRAMUSCULAR | Qty: 2 | Status: AC

## 2017-08-17 MED FILL — Lactated Ringer's Solution: INTRAVENOUS | Qty: 1000 | Status: AC

## 2017-08-17 MED FILL — Phenylephrine-NaCl IV Solution 10 MG/250ML-0.9%: INTRAVENOUS | Qty: 250 | Status: AC

## 2017-08-17 MED FILL — Lidocaine HCl IV Inj 20 MG/ML: INTRAVENOUS | Qty: 5 | Status: AC

## 2017-08-17 MED FILL — Dexamethasone Sodium Phosphate Inj 10 MG/ML: INTRAMUSCULAR | Qty: 0.8 | Status: AC

## 2017-08-17 MED FILL — Propofol IV Emul 200 MG/20ML (10 MG/ML): INTRAVENOUS | Qty: 20 | Status: AC

## 2017-08-17 NOTE — Care Management Important Message (Signed)
Important Message  Patient Details  Name: Christian Munoz MRN: [id number] Date of Birth: 10-25-1934   Medicare Important Message Given:  Yes    Kerin Salen 08/17/2017, 11:38 AMImportant Message  Patient Details  Name: Christian Munoz MRN: [id number] Date of Birth: Feb 24, 1935   Medicare Important Message Given:  Yes    Kerin Salen 08/17/2017, 11:37 AM

## 2017-08-17 NOTE — Progress Notes (Signed)
CSW spoke with pt's son re disposition plans. Son asks if pt should be able to go home or to SNF at [place]. CSW explained that PT session will help guide care decisions. Son agreeable to "whatever he needs." CSW explained briefly the SNF referral process if pt in need of ST therapy, also explaining that radiation/chemo/oncology appts often a barrier to SNF placement. Son understanding and states he will available for assistance once more of pt's care needs known and pt is progressing toward DC planning.  Sharren Bridge, MSW, LCSW Clinical Social Work 08/17/2017 [phone]

## 2017-08-17 NOTE — Progress Notes (Signed)
PROGRESS NOTE    Christian Munoz  GYI:[id number] DOB: Aug 18, 1935 DOA: 08/12/2017 PCP: [name], NP  Brief Narrative: Christian Munoz is a 82 y.o. male with medical history significant of Stage 3A small cell lung cancer completed concurrent chemotherapy/radiation in June 2018), atrial fibrillation and pulmonary embolism 1 month ago currently on Eliquis, balance difficulties over the past 2 weeks with falls, calf pain right leg pain and severe back pain which has gotten worse over the past 2 weeks and was relieved by nothing, he was admitted for further workup. Finally MRI after multiple attempts was completed under anesthesia on admission, noted extensive extraosseous epidural tumor extending through the thoraco lumbar spine, severe central canal stenosis at some levels, and foraminal stenosis at others, no evidence of spinal cord compression noted on MRI. Rad Onc consulted, s/p for XRT/Simulation, Tx to [facility], started XRT Onc Dr.Mohamed notified  Assessment & Plan:   1. Extensive Thoracolumbar extraosseous epidural metastasis -presenting with back pain/leg pain and leg weakness  -secondary to extensive thoracolumbar extraosseous epidural tumor, no spinal cord compression -Continue IV Decadron 4 mg every 12 -Patient with known stage IIIa small cell lung cancer status post concurrent chemoradiation in June 2 0 18 subsequently under observation per Dr. Julien Nordmann, now stage IV disease based on above MRI findings -Radiation oncology consult by Dr. Lisbeth Renshaw greatly appreciated, s/p XRT simulation 11/5 and treatment started for 10Rx -Tx to [facility] long, pain suboptimally controlled, continue morphine IR 44milligrams every 4 hours PRN, started fentanyl patch -continue lidocaine patch and gabapentin , physical therapy evaluation -Palliative consult requested per patient request, consult appreciated, plan for possible SNF with palliative care FU in few days  2.  Acute lower urinary tract  infection:  Recently completed multiple antibiotic courses, urine culture from 10/10 with enterococcus sensitive to ampicillin and vancomycin  -Cultures with enterococcus again -changed ampicillin to PO to complete 5day course, Day 4/5 now  3. Falls in the past 2 weeks:  -Due to problem #1  -Await physical therapy evaluation   4.  Chronic atrial fibrillation: -  resumed Eliquis - in sinus rhythm, rate controlled at this time   7.stage IV small cell lung cancer  - previously was stage IIIa status post chemoradiation in June 2 018  - now metastatic with above MRI findings  - radiation oncology consult, called and discussed with Dr. Julien Nordmann 11/5 - palliative consulted per patient request   7.  Hyperlipidemia: Continue home medications.  8.  Hypertension: Continue home regimen.  9.  Benign prostatic hyperplasia with urinary obstruction: -stable, continue flomax  10.  Goals of care counseling and discussion: -DO NOT RESUSCITATE, palliative following   DVT prophylaxis: On Eliquis Code Status: DNR. Family Communication: son Vicente Males at bedside 11/4 and 11/3 Disposition Plan: possibly SNF in few days  Consultants:   Palliative care  Radiation oncology/Dr. Lisbeth Renshaw   Procedures:   Antimicrobials:    Subjective: -feels ok, continues to have low back and R thigh pain, but improving, +BM  Objective: Vitals:   08/16/17 0344 08/16/17 1220 08/16/17 2048 08/17/17 0518  BP: (!) 148/72 (!) 178/96 (!) 143/87 (!) 147/84  Pulse: 78 81 76 86  Resp: 18 18 18 20   Temp: 97.9 F (36.6 C) 97.9 F (36.6 C) 99 F (37.2 C) 97.9 F (36.6 C)  TempSrc: Oral Oral Oral Oral  SpO2: 91% 92% 98% 92%  Weight:      Height:        Intake/Output Summary (Last 24 hours) at  08/17/2017 1427 Last data filed at 08/17/2017 1113 Gross per 24 hour  Intake 1122.67 ml  Output 650 ml  Net 472.67 ml   Filed Weights   08/13/17 1513  Weight: 72.6 kg (160 lb 0 oz)    Examination:  Gen: Awake,  Alert, Oriented X 3,  HEENT: PERRLA, Neck supple, no JVD Lungs: Good air movement bilaterally, CTAB CVS: RRR,No Gallops,Rubs or new Murmurs Abd: soft, Non tender, non distended, BS present Extremities: No Cyanosis, Clubbing or edema Skin: no new rashes Neuro: cranial nerves and higher functions intact,  right lower extremity is 4 out of 5 strength, left lower extremity is 5 out of 5 slightly increased hyperesthesia and right high and lower leg, upper extremities and cranial nerves unremarkable Psychiatric: mood and affect appropriate    Data Reviewed:   CBC: Recent Labs  Lab 08/12/17 2129 08/14/17 0631 08/16/17 0613  WBC 7.2 7.4 8.3  NEUTROABS 6.0  --   --   HGB 9.2* 8.6* 8.6*  HCT 30.6* 28.7* 28.8*  MCV 84.3 84.2 85.2  PLT 192 155 756*   Basic Metabolic Panel: Recent Labs  Lab 08/12/17 2129 08/14/17 0631 08/16/17 0613  NA 137 136 139  K 4.0 4.3 3.7  CL 101 99* 101  CO2 25 24 27   GLUCOSE 86 130* 136*  BUN 19 20 24*  CREATININE 1.03 1.09 0.99  CALCIUM 9.3 8.8* 8.9   GFR: Estimated Creatinine Clearance: 61.1 mL/min (by C-G formula based on SCr of 0.99 mg/dL). Liver Function Tests: No results for input(s): AST, ALT, ALKPHOS, BILITOT, PROT, ALBUMIN in the last 168 hours. No results for input(s): LIPASE, AMYLASE in the last 168 hours. No results for input(s): AMMONIA in the last 168 hours. Coagulation Profile: No results for input(s): INR, PROTIME in the last 168 hours. Cardiac Enzymes: No results for input(s): CKTOTAL, CKMB, CKMBINDEX, TROPONINI in the last 168 hours. BNP (last 3 results) No results for input(s): PROBNP in the last 8760 hours. HbA1C: No results for input(s): HGBA1C in the last 72 hours. CBG: No results for input(s): GLUCAP in the last 168 hours. Lipid Profile: No results for input(s): CHOL, HDL, LDLCALC, TRIG, CHOLHDL, LDLDIRECT in the last 72 hours. Thyroid Function Tests: No results for input(s): TSH, T4TOTAL, FREET4, T3FREE, THYROIDAB in  the last 72 hours. Anemia Panel: No results for input(s): VITAMINB12, FOLATE, FERRITIN, TIBC, IRON, RETICCTPCT in the last 72 hours. Urine analysis:    Component Value Date/Time   COLORURINE YELLOW 08/12/2017 1842   APPEARANCEUR CLOUDY (A) 08/12/2017 1842   LABSPEC 1.011 08/12/2017 1842   LABSPEC 1.005 01/12/2017 1239   PHURINE 7.0 08/12/2017 1842   GLUCOSEU NEGATIVE 08/12/2017 1842   GLUCOSEU Negative 01/12/2017 1239   HGBUR NEGATIVE 08/12/2017 1842   BILIRUBINUR NEGATIVE 08/12/2017 1842   BILIRUBINUR neg 07/21/2017 1542   BILIRUBINUR Negative 01/12/2017 1239   KETONESUR 5 (A) 08/12/2017 1842   PROTEINUR 30 (A) 08/12/2017 1842   UROBILINOGEN 0.2 07/21/2017 1542   UROBILINOGEN 0.2 01/12/2017 1239   NITRITE NEGATIVE 08/12/2017 1842   LEUKOCYTESUR LARGE (A) 08/12/2017 1842   LEUKOCYTESUR Negative 01/12/2017 1239   Sepsis Labs: @LABRCNTIP (procalcitonin:4,lacticidven:4)  ) Recent Results (from the past 240 hour(s))  Urine culture     Status: Abnormal   Collection Time: 08/13/17  2:15 AM  Result Value Ref Range Status   Specimen Description URINE, CLEAN CATCH  Final   Special Requests Normal  Final   Culture >=100,000 COLONIES/mL ENTEROCOCCUS FAECALIS (A)  Final   Report Status  08/15/2017 FINAL  Final   Organism ID, Bacteria ENTEROCOCCUS FAECALIS (A)  Final      Susceptibility   Enterococcus faecalis - MIC*    AMPICILLIN <=2 SENSITIVE Sensitive     LEVOFLOXACIN 1 SENSITIVE Sensitive     NITROFURANTOIN 32 SENSITIVE Sensitive     VANCOMYCIN 2 SENSITIVE Sensitive     * >=100,000 COLONIES/mL ENTEROCOCCUS FAECALIS         Radiology Studies: No results found.      Scheduled Meds: . amoxicillin  250 mg Oral Q8H  . apixaban  5 mg Oral BID  . aspirin EC  81 mg Oral Daily  . dexamethasone  4 mg Intravenous Q12H  . escitalopram  5 mg Oral Daily  . fentaNYL  25 mcg Transdermal Q72H  . gabapentin  300 mg Oral TID  . lidocaine  1 patch Transdermal Q24H  . multivitamin  with minerals  1 tablet Oral Daily  . omega-3 acid ethyl esters  2 g Oral Daily  . pantoprazole  20 mg Oral Daily  . QUEtiapine  25 mg Oral QHS  . simvastatin  20 mg Oral q1800  . sodium chloride flush  3 mL Intravenous Q12H  . sodium chloride flush  3 mL Intravenous Q12H  . tamsulosin  0.4 mg Oral Daily   Continuous Infusions: . sodium chloride 250 mL (08/14/17 0018)  . lactated ringers 10 mL/hr at 08/13/17 1514     LOS: 3 days    Time spent: 17min    Domenic Polite, MD [facility] Page via [email or web address], password [email or web address] After 7PM please contact night-coverage  08/17/2017, 2:27 PM

## 2017-08-17 NOTE — Progress Notes (Signed)
Daily Progress Note   Patient Name: Christian Munoz       Date: 08/17/2017 DOB: 1935-03-24  Age: 82 y.o. MRN#: [id number] Attending Physician: Domenic Polite, MD Primary Care Physician: [name], NP Admit Date: 08/12/2017  Reason for Consultation/Follow-up: Establishing goals of care  Subjective:  Met with Christian Munoz this afternoon.  Reports that pain has been fairly well controlled.  States some pain with radiation today.  Still having regular BMs.  See below:   Length of Stay: 3  Current Medications: Scheduled Meds:  . amoxicillin  250 mg Oral Q8H  . apixaban  5 mg Oral BID  . aspirin EC  81 mg Oral Daily  . dexamethasone  4 mg Intravenous Q12H  . escitalopram  5 mg Oral Daily  . fentaNYL  25 mcg Transdermal Q72H  . gabapentin  300 mg Oral TID  . lidocaine  1 patch Transdermal Q24H  . multivitamin with minerals  1 tablet Oral Daily  . omega-3 acid ethyl esters  2 g Oral Daily  . pantoprazole  20 mg Oral Daily  . QUEtiapine  25 mg Oral QHS  . simvastatin  20 mg Oral q1800  . sodium chloride flush  3 mL Intravenous Q12H  . sodium chloride flush  3 mL Intravenous Q12H  . tamsulosin  0.4 mg Oral Daily    Continuous Infusions: . sodium chloride 250 mL (08/14/17 0018)  . lactated ringers 10 mL/hr at 08/13/17 1514    PRN Meds: sodium chloride, acetaminophen **OR** acetaminophen, HYDROmorphone (DILAUDID) injection, morphine, ondansetron **OR** ondansetron (ZOFRAN) IV, sodium chloride flush PPS 40%  Physical Exam         Awake alert Still with ongoing weakness in RLE Clear Abdomen soft S1 S2 Pain in back Mood congruent  Vital Signs: BP (!) 147/84 (BP Location: Left Arm)   Pulse 86   Temp 97.9 F (36.6 C) (Oral)   Resp 20   Ht 5' 10"  (1.778 m)    Wt 72.6 kg (160 lb 0 oz)   SpO2 92%   BMI 22.96 kg/m  SpO2: SpO2: 92 % O2 Device: O2 Device: Not Delivered O2 Flow Rate: O2 Flow Rate (L/min): 2 L/min  Intake/output summary:   Intake/Output Summary (Last 24 hours) at 08/17/2017 1539 Last data filed at 08/17/2017 1113 Gross per 24 hour  Intake 1122.67 ml  Output 650 ml  Net 472.67 ml   LBM: Last BM Date: 08/16/17 Baseline Weight: Weight: 72.6 kg (160 lb 0 oz) Most recent weight: Weight: 72.6 kg (160 lb 0 oz)       Palliative Assessment/Data:    Flowsheet Rows     Most Recent Value  Intake Tab  Referral Department  Hospitalist  Unit at Time of Referral  Cardiac/Telemetry Unit  Palliative Care Primary Diagnosis  Cancer  Palliative Care Type  New Palliative care  Reason for referral  Pain, Clarify Goals of Care, Counsel Regarding Hospice  Date first seen by Palliative Care  08/14/17  Clinical Assessment  Palliative Performance Scale Score  30%  Pain Max last 24 hours  6  Pain Min Last 24 hours  5  Dyspnea Max Last 24 Hours  4  Dyspnea Min Last 24 hours  3  Nausea Max Last 24 Hours  2  Nausea Min Last 24 Hours  1  Anxiety Max Last 24 Hours  5  Anxiety Min Last 24 Hours  4  Psychosocial & Spiritual Assessment  Palliative Care Outcomes  Patient/Family meeting held?  Yes  Who was at the meeting?  patient and son.   Palliative Care Outcomes  Clarified goals of care, Improved pain interventions      Patient Active Problem List   Diagnosis Date Noted  . Spine metastasis ([place]) 08/16/2017  . Fall   . Radicular leg pain   . Encounter for palliative care   . Leg weakness 08/13/2017  . Acute back pain less than 4 weeks duration 08/13/2017  . Acute lower UTI   . Small cell lung cancer in adult [facility])   . Anemia   . Hypoxemia   . Pulmonary embolism ([place]) 07/13/2017  . Atrial fibrillation, chronic ([place]) 07/13/2017  . Lower urinary tract infectious disease 07/13/2017  . Chronic pain 07/13/2017  . Antineoplastic  chemotherapy induced anemia 03/01/2017  . Cancer associated pain 02/08/2017  . Dysuria 01/18/2017  . Fever 01/13/2017  . Port catheter in place 01/12/2017  . Small cell lung cancer, left ([place]) 11/30/2016  . Goals of care, counseling/discussion 11/30/2016  . Encounter for antineoplastic chemotherapy 11/30/2016  . History of fall 02/28/2016  . Hypertension 12/04/2014  . Benign prostatic hyperplasia with urinary obstruction 12/04/2014  . Depression 12/04/2014  . Hyperlipidemia 01/06/2013  . Reflux esophagitis   . Osteoarthrosis, unspecified whether generalized or localized, unspecified site     Palliative Care Assessment & Plan   Patient Profile:   Assessment:  small cell lung cancer Extensive thoraco lumbar osseous metastatic burden Uncontrolled pain Weakness in RLE UTI Has chronic A fib BPH History of falls and weakness lately.   Recommendations/Plan: Await PT evaluation Continue radiation.  We discussed pretreating with rescue med prior to going for treatment if pain continues to be an issue Pain well controlled with one dose rescue MSIR this AM.  Continue with current pain and adjuvant medication regimen.  Constipation currently not an issue.  Miralax as needed if constipation becomes an issue. Recommend SNF rehab with palliative services following on discharge.  Continue supportive care    Code Status:    Code Status Orders  (From admission, onward)        Start     Ordered   08/13/17 1216  Do not attempt resuscitation (DNR)  Continuous    Question Answer Comment  In the event of cardiac or respiratory ARREST Do not call a "code blue"  In the event of cardiac or respiratory ARREST Do not perform Intubation, CPR, defibrillation or ACLS   In the event of cardiac or respiratory ARREST Use medication by any route, position, wound care, and other measures to relive pain and suffering. May use oxygen, suction and manual treatment of airway obstruction as needed for  comfort.      08/13/17 1220    Code Status History    Date Active Date Inactive Code Status Order ID Comments User Context   07/13/2017 11:30 07/15/2017 18:13 Full Code [id number]  Waldemar Dickens, MD ED   01/14/2017 13:41 07/13/2017 03:44 DNR [id number]  [name], NP Outpatient   04/04/2015 14:44 10/23/2016 15:07 DNR [id number]  [name], NP Outpatient    Advance Directive Documentation     Most Recent Value  Type of Advance Directive  Healthcare Power of [place], Living will, Out of facility DNR (pink MOST or yellow form)  Pre-existing out of facility DNR order (yellow form or pink MOST form)  Yellow form placed in chart (order not valid for inpatient use)  "MOST" Form in Place?  No data       Prognosis:   guarded   Discharge Planning:  [place] for rehab with Palliative care service follow-up  Care plan was discussed with  Patient.  Thank you for allowing the Palliative Medicine Team to assist in the care of this patient.   Time In: 1500 Time Out: 20 Total Time 20 Prolonged Time Billed  no       Greater than 50%  of this time was spent counseling and coordinating care related to the above assessment and plan.  Micheline Rough, MD [place] Team [phone]   Please contact Palliative Medicine Team phone at [phone] for questions and concerns.

## 2017-08-18 ENCOUNTER — Ambulatory Visit
Admit: 2017-08-18 | Discharge: 2017-08-18 | Disposition: A | Discharge status: Home / Self Care | Payer: Medicare Other | Attending: Radiation Oncology | Admitting: Radiation Oncology

## 2017-08-18 DIAGNOSIS — R29898 Other symptoms and signs involving the musculoskeletal system: Secondary | ICD-10-CM

## 2017-08-18 NOTE — Progress Notes (Signed)
CHART NOTE  The patient is seen today.  He was lying in bed and feeling fine.  He was recently admitted with low back pain and MRI of the thoracic and lumbar spines showed severe central canal stenosis at L3 secondary to extraosseous posterior epidural tumor.  There was also extraosseous tumor extends into the right foramen at L4-5 with severe right foraminal stenosis.  The patient also has a lot of degenerative changes in the spine. He was seen by radiation oncology and started on palliative radiation. His last visit in the office he was considering palliative care and hospice. I had a lengthy discussion with the patient today about his condition.  I strongly recommended for him to consider palliative care and hospice at this point. He does not think he would be able to go home with his current condition.  He probably will need discharge to a skilled nursing facility or hospice facility. Thank you for taken good care of Christian Munoz.  I would continue to follow up the patient with you and assist in his management on as-needed basis.

## 2017-08-18 NOTE — Progress Notes (Addendum)
PROGRESS NOTE    Christian Munoz  OEU:[id number] DOB: Aug 03, 1935 DOA: 08/12/2017 PCP: [name], NP   Brief Narrative:  82 y.o.malewith medical history significant of Stage 3A small cell lung cancer completed concurrent chemotherapy/radiation in June 2018), atrial fibrillation and pulmonary embolism 1 month ago currently on Eliquis, balance difficulties over the past 2 weeks with falls, calf pain right leg pain and severe back pain which has gotten worse over the past 2 weeks and was relieved by nothing, he was admitted for further workup. Finally MRI after multiple attempts was completed under anesthesia on admission, noted extensive extraosseous epidural tumor extending through the thoraco lumbar spine, severe central canal stenosis at some levels, and foraminal stenosis at others, no evidence of spinal cord compression noted on MRI. Rad Onc consulted, s/p for XRT/Simulation, Tx to [facility], started XRT Onc Dr.Mohamed notified  Assessment & Plan:   Principal Problem:   Leg weakness Active Problems:   Hyperlipidemia   Hypertension   Benign prostatic hyperplasia with urinary obstruction   History of fall   Goals of care, counseling/discussion   Atrial fibrillation, chronic (HCC)   Acute lower UTI   Small cell lung cancer in adult [facility])   Acute back pain less than 4 weeks duration   Fall   Radicular leg pain   Encounter for palliative care   1. Extensive Thoracolumbar extraosseous epidural metastasis -presenting with back pain/leg pain and leg weakness  -secondary to extensive thoracolumbar extraosseous epidural tumor, no spinal cord compression -Continue IV Decadron 4 mg every 12 -Patient with known stage IIIa small cell lung cancer status post concurrent chemoradiation in June 2 0 18 subsequently under observation per Dr. Julien Nordmann, now stage IV disease based on above MRI findings -Radiation oncology consult by Dr. Lisbeth Renshaw greatly appreciated, s/p XRT simulation  11/5 and treatment started for 10Rx -Tx to [facility] long, pain suboptimally controlled, continue morphine IR 53milligrams every 4 hours PRN, started fentanyl patch -continue lidocaine patch and gabapentin , physical therapy evaluation -Palliative consult requested per patient request, consult appreciated, plan for possible SNF with palliative care FU in few days [ ]  discussed LE weakness with neurosurgery as this seems to be getting worse, they will see him on 11/8.   2. Acute lower urinary tract infection: Recently completed multiple antibiotic courses, urine culture from 10/10 with enterococcus sensitive to ampicillin and vancomycin  -Cultures with enterococcus again -changed ampicillin to PO to complete 5day course (tonight)  3. Falls in the past 2 weeks: -Due to problem #1  -Await physical therapy evaluation   4. Chronic atrial fibrillation: - resumed Eliquis - in sinus rhythm, rate controlled at this time   7.stage IV small cell lung cancer  - previously was stage IIIa status post chemoradiation in June 2 018  - now metastatic with above MRI findings  - radiation oncology consult, called and discussed with Dr. Julien Nordmann 11/5 - palliative consulted per patient request   7. Hyperlipidemia:Continue home medications.  8. Hypertension:Continue home regimen.  9. Benign prostatic hyperplasia with urinary obstruction: -stable, continue flomax  10. Goals of care counseling and discussion: -DO NOT RESUSCITATE, palliative following       DVT prophylaxis: (Lovenox/Heparin/SCD's/anticoagulated/None (if comfort care) Code Status: (Full/Partial - specify details) Family Communication: (Specify name, relationship & date discussed. NO "discussed with patient") Disposition Plan: (specify when and where you expect patient to be discharged). Include barriers to DC in this tab.   Consultants:   Palliative and radiation oncology  Procedures: (Don't include  imaging  studies which can be auto populated. Include things that cannot be auto populated i.e. Echo, Carotid and venous dopplers, Foley, Bipap, HD, tubes/drains, wound vac, central lines etc)  Radiation therapy  Antimicrobials: (specify start and planned stop date. Auto populated tables are space occupying and do not give end dates) Anti-infectives (From admission, onward)   Start     Dose/Rate Route Frequency Ordered Stop   08/16/17 1400  amoxicillin (AMOXIL) capsule 250 mg     250 mg Oral Every 8 hours 08/16/17 1239 08/19/17 0559   08/16/17 1200  ampicillin (PRINCIPEN) capsule 250 mg  Status:  Discontinued     250 mg Oral Every 6 hours 08/16/17 1143 08/16/17 1239   08/14/17 0600  ampicillin (OMNIPEN) 1 g in sodium chloride 0.9 % 50 mL IVPB  Status:  Discontinued     1 g 150 mL/hr over 20 Minutes Intravenous Every 6 hours 08/13/17 1242 08/16/17 1143   08/13/17 2300  vancomycin (VANCOCIN) IVPB 1000 mg/200 mL premix  Status:  Discontinued     1,000 mg 200 mL/hr over 60 Minutes Intravenous Every 12 hours 08/13/17 1224 08/13/17 1228   08/13/17 1230  vancomycin (VANCOCIN) IVPB 1000 mg/200 mL premix  Status:  Discontinued     1,000 mg 200 mL/hr over 60 Minutes Intravenous Every 12 hours 08/13/17 1223 08/13/17 1224   08/13/17 1100  vancomycin (VANCOCIN) IVPB 1000 mg/200 mL premix     1,000 mg 200 mL/hr over 60 Minutes Intravenous  Once 08/13/17 1055 08/13/17 1233         Subjective: Some back pain, seems Timm Bonenberger bit better, but still present.   Objective: Vitals:   08/17/17 0518 08/17/17 1407 08/17/17 2037 08/18/17 0506  BP: (!) 147/84 (!) 146/96 119/87 (!) 154/92  Pulse: 86 95 (!) 112 87  Resp: 20 16 18 18   Temp: 97.9 F (36.6 C) 98.1 F (36.7 C) 98 F (36.7 C) 98.3 F (36.8 C)  TempSrc: Oral Oral Oral Oral  SpO2: 92% 92% 98% 98%  Weight:      Height:        Intake/Output Summary (Last 24 hours) at 08/18/2017 0955 Last data filed at 08/18/2017 0100 Gross per 24 hour  Intake 720 ml    Output 1000 ml  Net -280 ml   Filed Weights   08/13/17 1513  Weight: 72.6 kg (160 lb 0 oz)    Examination:  General exam: Appears calm and comfortable  Respiratory system: Clear to auscultation. Respiratory effort normal. Cardiovascular system: S1 & S2 heard, RRR. No JVD, murmurs, rubs, gallops or clicks. No pedal edema. Gastrointestinal system: Abdomen is nondistended, soft and nontender. No organomegaly or masses felt. Normal bowel sounds heard. Central nervous system: Alert and oriented. No focal neurological deficits. Extremities: RLE weakness with decreased sensation to RLE.  Skin: No rashes, lesions or ulcers Psychiatry: Judgement and insight appear normal. Mood & affect appropriate.     Data Reviewed: I have personally reviewed following labs and imaging studies  CBC: Recent Labs  Lab 08/12/17 2129 08/14/17 0631 08/16/17 0613  WBC 7.2 7.4 8.3  NEUTROABS 6.0  --   --   HGB 9.2* 8.6* 8.6*  HCT 30.6* 28.7* 28.8*  MCV 84.3 84.2 85.2  PLT 192 155 779*   Basic Metabolic Panel: Recent Labs  Lab 08/12/17 2129 08/14/17 0631 08/16/17 0613  NA 137 136 139  K 4.0 4.3 3.7  CL 101 99* 101  CO2 25 24 27   GLUCOSE 86 130* 136*  BUN 19 20 24*  CREATININE 1.03 1.09 0.99  CALCIUM 9.3 8.8* 8.9   GFR: Estimated Creatinine Clearance: 61.1 mL/min (by C-G formula based on SCr of 0.99 mg/dL). Liver Function Tests: No results for input(s): AST, ALT, ALKPHOS, BILITOT, PROT, ALBUMIN in the last 168 hours. No results for input(s): LIPASE, AMYLASE in the last 168 hours. No results for input(s): AMMONIA in the last 168 hours. Coagulation Profile: No results for input(s): INR, PROTIME in the last 168 hours. Cardiac Enzymes: No results for input(s): CKTOTAL, CKMB, CKMBINDEX, TROPONINI in the last 168 hours. BNP (last 3 results) No results for input(s): PROBNP in the last 8760 hours. HbA1C: No results for input(s): HGBA1C in the last 72 hours. CBG: No results for input(s):  GLUCAP in the last 168 hours. Lipid Profile: No results for input(s): CHOL, HDL, LDLCALC, TRIG, CHOLHDL, LDLDIRECT in the last 72 hours. Thyroid Function Tests: No results for input(s): TSH, T4TOTAL, FREET4, T3FREE, THYROIDAB in the last 72 hours. Anemia Panel: No results for input(s): VITAMINB12, FOLATE, FERRITIN, TIBC, IRON, RETICCTPCT in the last 72 hours. Sepsis Labs: No results for input(s): PROCALCITON, LATICACIDVEN in the last 168 hours.  Recent Results (from the past 240 hour(s))  Urine culture     Status: Abnormal   Collection Time: 08/13/17  2:15 AM  Result Value Ref Range Status   Specimen Description URINE, CLEAN CATCH  Final   Special Requests Normal  Final   Culture >=100,000 COLONIES/mL ENTEROCOCCUS FAECALIS (Maheen Cwikla)  Final   Report Status 08/15/2017 FINAL  Final   Organism ID, Bacteria ENTEROCOCCUS FAECALIS (Allan Minotti)  Final      Susceptibility   Enterococcus faecalis - MIC*    AMPICILLIN <=2 SENSITIVE Sensitive     LEVOFLOXACIN 1 SENSITIVE Sensitive     NITROFURANTOIN 32 SENSITIVE Sensitive     VANCOMYCIN 2 SENSITIVE Sensitive     * >=100,000 COLONIES/mL ENTEROCOCCUS FAECALIS         Radiology Studies: No results found.      Scheduled Meds: . amoxicillin  250 mg Oral Q8H  . apixaban  5 mg Oral BID  . aspirin EC  81 mg Oral Daily  . dexamethasone  4 mg Intravenous Q12H  . escitalopram  5 mg Oral Daily  . fentaNYL  25 mcg Transdermal Q72H  . gabapentin  300 mg Oral TID  . lidocaine  1 patch Transdermal Q24H  . multivitamin with minerals  1 tablet Oral Daily  . omega-3 acid ethyl esters  2 g Oral Daily  . pantoprazole  20 mg Oral Daily  . QUEtiapine  25 mg Oral QHS  . simvastatin  20 mg Oral q1800  . sodium chloride flush  3 mL Intravenous Q12H  . sodium chloride flush  3 mL Intravenous Q12H  . tamsulosin  0.4 mg Oral Daily   Continuous Infusions: . sodium chloride 250 mL (08/14/17 0018)  . lactated ringers 10 mL/hr at 08/13/17 1514     LOS: 4 days     Time spent: over 85 min    Fayrene Helper, MD [facility] Pager [phone]   If 7PM-7AM, please contact night-coverage [email or web address] Password [email or web address] 08/18/2017, 9:55 AM

## 2017-08-18 NOTE — Progress Notes (Signed)
CSW following to assist with disposition plan. After discussion with palliative team and PT evaluation, pt and son have agreed to pursue SNF for ST rehab with palliative following there and at [facility] after rehab. Pt has daily radiation appts at [facility] on weekdays until 08/27/17- will need transportation by facility per son.  CSW made referrals and will follow up with bed offers.   Sharren Bridge, MSW, LCSW Clinical Social Work 08/18/2017 [phone]

## 2017-08-18 NOTE — Progress Notes (Signed)
Rehab Admissions Coordinator Note:  Patient was screened by Retta Diones for appropriateness for an Inpatient Acute Rehab Consult.  At this time, we are recommending Hospice or SNF placement.  Please see oncology recommendations from today.  Doubt that inpatient rehab admission would change overall patient status.  I will follow along for progress.  Retta Diones 08/18/2017, 12:04 PM  I can be reached at [phone].

## 2017-08-18 NOTE — NC FL2 (Signed)
[place] LEVEL OF CARE SCREENING TOOL     IDENTIFICATION  Patient Name: Christian Munoz Birthdate: 1935/05/02 Sex: male Admission Date (Current Location): 08/12/2017  [facility] and [place] Number:  Herbalist and Address:  [facility],  [address], [place]      Provider Number: [id number]  Attending Physician Name and Address:  Elodia Florence., *  Relative Name and Phone Number:       Current Level of Care: Hospital Recommended Level of Care: [place] Prior Approval Number:    Date Approved/Denied:   PASRR Number: Pending  Discharge Plan: SNF    Current Diagnoses: Patient Active Problem List   Diagnosis Date Noted  . Spine metastasis ([place]) 08/16/2017  . Fall   . Radicular leg pain   . Encounter for palliative care   . Leg weakness 08/13/2017  . Acute back pain less than 4 weeks duration 08/13/2017  . Acute lower UTI   . Small cell lung cancer in adult [facility])   . Anemia   . Hypoxemia   . Pulmonary embolism ([place]) 07/13/2017  . Atrial fibrillation, chronic ([place]) 07/13/2017  . Lower urinary tract infectious disease 07/13/2017  . Chronic pain 07/13/2017  . Antineoplastic chemotherapy induced anemia 03/01/2017  . Cancer associated pain 02/08/2017  . Dysuria 01/18/2017  . Fever 01/13/2017  . Port catheter in place 01/12/2017  . Small cell lung cancer, left ([place]) 11/30/2016  . Goals of care, counseling/discussion 11/30/2016  . Encounter for antineoplastic chemotherapy 11/30/2016  . History of fall 02/28/2016  . Hypertension 12/04/2014  . Benign prostatic hyperplasia with urinary obstruction 12/04/2014  . Depression 12/04/2014  . Hyperlipidemia 01/06/2013  . Reflux esophagitis   . Osteoarthrosis, unspecified whether generalized or localized, unspecified site     Orientation RESPIRATION BLADDER Height & Weight     Self  Normal Continent Weight: 160 lb 0 oz (72.6 kg) Height:  5\' 10"   (177.8 cm)  BEHAVIORAL SYMPTOMS/MOOD NEUROLOGICAL BOWEL NUTRITION STATUS      Continent Diet(Regular)  AMBULATORY STATUS COMMUNICATION OF NEEDS Skin   Extensive Assist Verbally Normal                       Personal Care Assistance Level of Assistance  Bathing, Feeding, Dressing Bathing Assistance: Limited assistance Feeding assistance: Independent Dressing Assistance: Limited assistance     Functional Limitations Info  Sight, Hearing, Speech Sight Info: Impaired Hearing Info: Impaired Speech Info: Adequate    SPECIAL CARE FACTORS FREQUENCY  PT (By licensed PT), OT (By licensed OT)     PT Frequency: 5x/week OT Frequency: 5x/week            Contractures Contractures Info: Not present    Additional Factors Info  Code Status, Allergies Code Status Info: DNR Allergies Info: Chantix Varenicline, Zoloft Sertraline Hcl           Current Medications (08/18/2017):  This is the current hospital active medication list Current Facility-Administered Medications  Medication Dose Route Frequency Provider Last Rate Last Dose  . 0.9 %  sodium chloride infusion  250 mL Intravenous PRN Lady Deutscher, MD 10 mL/hr at 08/14/17 0018 250 mL at 08/14/17 0018  . acetaminophen (TYLENOL) tablet 650 mg  650 mg Oral Q6H PRN Lady Deutscher, MD   650 mg at 08/15/17 0037   Or  . acetaminophen (TYLENOL) suppository 650 mg  650 mg Rectal Q6H PRN Lady Deutscher, MD      .  amoxicillin (AMOXIL) capsule 250 mg  250 mg Oral Q8H Domenic Polite, MD   250 mg at 08/18/17 1530  . apixaban (ELIQUIS) tablet 5 mg  5 mg Oral BID Lady Deutscher, MD   5 mg at 08/18/17 1024  . aspirin EC tablet 81 mg  81 mg Oral Daily Lady Deutscher, MD   81 mg at 08/16/17 6294  . dexamethasone (DECADRON) injection 4 mg  4 mg Intravenous Q12H Domenic Polite, MD   4 mg at 08/18/17 1022  . escitalopram (LEXAPRO) tablet 5 mg  5 mg Oral Daily Lady Deutscher, MD   5 mg at 08/18/17 1024  . fentaNYL  (DURAGESIC - dosed mcg/hr) patch 25 mcg  25 mcg Transdermal Q72H Domenic Polite, MD   25 mcg at 08/16/17 1407  . gabapentin (NEURONTIN) capsule 300 mg  300 mg Oral TID Lady Deutscher, MD   300 mg at 08/18/17 1530  . HYDROmorphone (DILAUDID) injection 1 mg  1 mg Intravenous Q3H PRN Domenic Polite, MD   1 mg at 08/17/17 2344  . lactated ringers infusion   Intravenous Continuous Nolon Nations, MD 10 mL/hr at 08/13/17 1514    . lidocaine (LIDODERM) 5 % 1 patch  1 patch Transdermal Q24H Loistine Chance, MD   1 patch at 08/18/17 1022  . morphine (MSIR) tablet 15 mg  15 mg Oral Q4H PRN Domenic Polite, MD   15 mg at 08/18/17 1333  . multivitamin with minerals tablet 1 tablet  1 tablet Oral Daily Lady Deutscher, MD   1 tablet at 08/18/17 1023  . omega-3 acid ethyl esters (LOVAZA) capsule 2 g  2 g Oral Daily Lady Deutscher, MD   2 g at 08/18/17 1023  . ondansetron (ZOFRAN) tablet 4 mg  4 mg Oral Q6H PRN Lady Deutscher, MD       Or  . ondansetron [facility]) injection 4 mg  4 mg Intravenous Q6H PRN Lady Deutscher, MD      . pantoprazole (PROTONIX) EC tablet 20 mg  20 mg Oral Daily Lady Deutscher, MD   20 mg at 08/18/17 1024  . QUEtiapine (SEROQUEL) tablet 25 mg  25 mg Oral QHS Lady Deutscher, MD   25 mg at 08/17/17 2200  . simvastatin (ZOCOR) tablet 20 mg  20 mg Oral q1800 Lady Deutscher, MD   20 mg at 08/17/17 1818  . sodium chloride flush (NS) 0.9 % injection 3 mL  3 mL Intravenous Q12H Lady Deutscher, MD   3 mL at 08/17/17 2200  . sodium chloride flush (NS) 0.9 % injection 3 mL  3 mL Intravenous Q12H Lady Deutscher, MD   3 mL at 08/17/17 1059  . sodium chloride flush (NS) 0.9 % injection 3 mL  3 mL Intravenous PRN Lady Deutscher, MD      . tamsulosin [facility]) capsule 0.4 mg  0.4 mg Oral Daily Lady Deutscher, MD   0.4 mg at 08/18/17 1024     Discharge Medications: Please see discharge summary for a list of discharge medications.  Relevant Imaging  Results:  Relevant Lab Results:   Additional Information SSN:[id number]/ Needs Pallative care to follow and has radiation appt. until 11/16 needs transportation.   Lia Hopping, LCSW

## 2017-08-18 NOTE — Progress Notes (Signed)
Daily Progress Note   Patient Name: Christian Munoz       Date: 08/18/2017 DOB: [birth year]  Age: 82 y.o. MRN#: [id number] Attending Physician: Elodia Florence., * Primary Care Physician: [name], NP Admit Date: 08/12/2017  Reason for Consultation/Follow-up: Establishing goals of care  Subjective:  Met with Mr. Draheim and his son, Vicente Males, this afternoon.  Reports that pain has been fairly well controlled on current regimen.  Still having regular BMs.  He is in agreement that a good plan would be to plan to pursue SNF for rehab. He has done well with rehabbing in the past. If he does well and continues to thrive, I encouraged they continue with this plan. If, however he is unable to regain function and he continues to decline, I recommended that he speak with his PCP to determine if he may be better served by focusing his care on symptom management with support of organization such as hospice.  See below:   Length of Stay: 4  Current Medications: Scheduled Meds:  . amoxicillin  250 mg Oral Q8H  . apixaban  5 mg Oral BID  . aspirin EC  81 mg Oral Daily  . dexamethasone  4 mg Intravenous Q12H  . escitalopram  5 mg Oral Daily  . fentaNYL  25 mcg Transdermal Q72H  . gabapentin  300 mg Oral TID  . lidocaine  1 patch Transdermal Q24H  . multivitamin with minerals  1 tablet Oral Daily  . omega-3 acid ethyl esters  2 g Oral Daily  . pantoprazole  20 mg Oral Daily  . QUEtiapine  25 mg Oral QHS  . simvastatin  20 mg Oral q1800  . sodium chloride flush  3 mL Intravenous Q12H  . sodium chloride flush  3 mL Intravenous Q12H  . tamsulosin  0.4 mg Oral Daily    Continuous Infusions: . sodium chloride 250 mL (08/14/17 0018)  . lactated ringers 10 mL/hr at 08/13/17 1514     PRN Meds: sodium chloride, acetaminophen **OR** acetaminophen, HYDROmorphone (DILAUDID) injection, morphine, ondansetron **OR** ondansetron (ZOFRAN) IV, sodium chloride flush PPS 40%  Physical Exam         Awake alert Still with ongoing weakness in RLE Clear Abdomen soft S1 S2 Pain in back Mood congruent  Vital Signs: BP 133/85 (BP Location:  Right Arm)   Pulse 99   Temp 98 F (36.7 C) (Oral)   Resp 16   Ht 5' 10"  (1.778 m)   Wt 72.6 kg (160 lb 0 oz)   SpO2 93%   BMI 22.96 kg/m  SpO2: SpO2: 93 % O2 Device: O2 Device: Not Delivered O2 Flow Rate: O2 Flow Rate (L/min): 2 L/min  Intake/output summary:   Intake/Output Summary (Last 24 hours) at 08/18/2017 1545 Last data filed at 08/18/2017 1405 Gross per 24 hour  Intake 840 ml  Output 650 ml  Net 190 ml   LBM: Last BM Date: 08/18/17 Baseline Weight: Weight: 72.6 kg (160 lb 0 oz) Most recent weight: Weight: 72.6 kg (160 lb 0 oz)       Palliative Assessment/Data:    Flowsheet Rows     Most Recent Value  Intake Tab  Referral Department  Hospitalist  Unit at Time of Referral  Cardiac/Telemetry Unit  Palliative Care Primary Diagnosis  Cancer  Palliative Care Type  New Palliative care  Reason for referral  Pain, Clarify Goals of Care, Counsel Regarding Hospice  Date first seen by Palliative Care  08/14/17  Clinical Assessment  Palliative Performance Scale Score  30%  Pain Max last 24 hours  6  Pain Min Last 24 hours  5  Dyspnea Max Last 24 Hours  4  Dyspnea Min Last 24 hours  3  Nausea Max Last 24 Hours  2  Nausea Min Last 24 Hours  1  Anxiety Max Last 24 Hours  5  Anxiety Min Last 24 Hours  4  Psychosocial & Spiritual Assessment  Palliative Care Outcomes  Patient/Family meeting held?  Yes  Who was at the meeting?  patient and son.   Palliative Care Outcomes  Clarified goals of care, Improved pain interventions      Patient Active Problem List   Diagnosis Date Noted  . Spine metastasis ([place])  08/16/2017  . Fall   . Radicular leg pain   . Encounter for palliative care   . Leg weakness 08/13/2017  . Acute back pain less than 4 weeks duration 08/13/2017  . Acute lower UTI   . Small cell lung cancer in adult [facility])   . Anemia   . Hypoxemia   . Pulmonary embolism ([place]) 07/13/2017  . Atrial fibrillation, chronic ([place]) 07/13/2017  . Lower urinary tract infectious disease 07/13/2017  . Chronic pain 07/13/2017  . Antineoplastic chemotherapy induced anemia 03/01/2017  . Cancer associated pain 02/08/2017  . Dysuria 01/18/2017  . Fever 01/13/2017  . Port catheter in place 01/12/2017  . Small cell lung cancer, left ([place]) 11/30/2016  . Goals of care, counseling/discussion 11/30/2016  . Encounter for antineoplastic chemotherapy 11/30/2016  . History of fall 02/28/2016  . Hypertension 12/04/2014  . Benign prostatic hyperplasia with urinary obstruction 12/04/2014  . Depression 12/04/2014  . Hyperlipidemia 01/06/2013  . Reflux esophagitis   . Osteoarthrosis, unspecified whether generalized or localized, unspecified site     Palliative Care Assessment & Plan   Patient Profile:   Assessment:  small cell lung cancer Extensive thoraco lumbar osseous metastatic burden Uncontrolled pain Weakness in RLE UTI Has chronic A fib BPH History of falls and weakness lately.   Recommendations/Plan: Continue radiation.  We discussed pretreating with rescue med prior to going for treatment if pain continues to be an issue Pain well controlled with current rescue MSIR this AM.  Continue with current pain and adjuvant medication regimen.  Constipation currently not an issue.  Miralax as needed if constipation becomes an issue. - I met today with patient and his son, Vicente Males.  We discussed options moving forward including trial of rehab vs electing his hospice benefit.  He would like to pursue trial of rehab to see how much functional status he can gain while continuing his course of radiation  therapy.  If he is making functional gains, I recommended that he continue with rehab even after he finishes radiation until he hits a plateau or completes his rehab days.  He may then consider electing his hospice benefit at that time.  I recommended palliative to follow him at SNF to assist in decision making moving forward.    Code Status:    Code Status Orders  (From admission, onward)        Start     Ordered   08/13/17 1216  Do not attempt resuscitation (DNR)  Continuous    Question Answer Comment  In the event of cardiac or respiratory ARREST Do not call a "code blue"   In the event of cardiac or respiratory ARREST Do not perform Intubation, CPR, defibrillation or ACLS   In the event of cardiac or respiratory ARREST Use medication by any route, position, wound care, and other measures to relive pain and suffering. May use oxygen, suction and manual treatment of airway obstruction as needed for comfort.      08/13/17 1220    Code Status History    Date Active Date Inactive Code Status Order ID Comments User Context   07/13/2017 11:30 07/15/2017 18:13 Full Code [id number]  Waldemar Dickens, MD ED   01/14/2017 13:41 07/13/2017 03:44 DNR [id number]  [name], NP Outpatient   04/04/2015 14:44 10/23/2016 15:07 DNR [id number]  [name], NP Outpatient    Advance Directive Documentation     Most Recent Value  Type of Advance Directive  Healthcare Power of [place], Living will, Out of facility DNR (pink MOST or yellow form)  Pre-existing out of facility DNR order (yellow form or pink MOST form)  Yellow form placed in chart (order not valid for inpatient use)  "MOST" Form in Place?  No data       Prognosis:   guarded   Discharge Planning:  [place] for rehab with Palliative care service follow-up  Care plan was discussed with  Patient.  Thank you for allowing the Palliative Medicine Team to assist in the care of this patient.   Time In:  1350 Time Out: 1420 Total Time 30 Prolonged Time Billed  no       Greater than 50%  of this time was spent counseling and coordinating care related to the above assessment and plan.  Micheline Rough, MD [place] Team [phone]   Please contact Palliative Medicine Team phone at [phone] for questions and concerns.

## 2017-08-18 NOTE — Evaluation (Signed)
Physical Therapy Evaluation Patient Details Name: Christian Munoz MRN: [id number] DOB: 01-21-1935 Today's Date: 08/18/2017   History of Present Illness  PAtient is an 82 y/o male admitted with history significant of Stage 3A small cell lung cancer completed concurrent chemotherapy/radiation in June 2018), atrial fibrillation and pulmonary embolism 1 month ago currently on Eliquis, balance difficulties over the past 2 weeks with falling from a sitting position on 2 occasions at home. Now with right leg pain and severe back pain. MRI of the thoracic and lumbar spines showed severe central canal stenosis at L3 secondary to extraosseous posterior epidural tumor.  There was also extraosseous tumor extends into the right foramen at L4-5 with severe right foraminal stenosis.    Clinical Impression  Patient presents with decreased independence with mobility currently +2 mod A for OOB and very limited ambulation.  Previously was independent with ambulation with cane and lived alone.  Son local to assist PRN.  Current limitations include decreased sensation and strength R LE, pain, decreased activity tolerance, decreased awareness of deficits and very high fall risk.  Feel pt may be good candidate for CIR level rehab prior to d/c home depending on level of assist from family.  Will follow acutely as well to address issues and progress mobility as able.     Follow Up Recommendations CIR    Equipment Recommendations  Rolling walker with 5" wheels;Wheelchair (measurements PT);Wheelchair cushion (measurements PT)    Recommendations for Other Services Rehab consult     Precautions / Restrictions Precautions Precautions: Fall Precaution Comments: decreased vision and hearing      Mobility  Bed Mobility Overal bed mobility: Needs Assistance Bed Mobility: Rolling;Sidelying to Sit Rolling: Min assist Sidelying to sit: Mod assist       General bed mobility comments: increased time and max cues for  technique, assist for flexing R LE and pulling to roll and for legs off bed and trunk to sit  Transfers Overall transfer level: Needs assistance Equipment used: Rolling walker (2 wheeled) Transfers: Sit to/from Stand Sit to Stand: Mod assist;+2 physical assistance;From elevated surface         General transfer comment: assist to place R LE, cues for hand placement, assist to lift into standing and cues for hand placement, pt able to lower to chair   Ambulation/Gait Ambulation/Gait assistance: Mod assist;+2 physical assistance Ambulation Distance (Feet): 3 Feet Assistive device: Rolling walker (2 wheeled) Gait Pattern/deviations: Step-to pattern;Decreased stride length;Decreased dorsiflexion - right;Narrow base of support     General Gait Details: assist to move R LE, assist to extend knee, pt able to weight bear when stepping with L with UE support on walker and assist forbalance  Stairs            Wheelchair Mobility    Modified Rankin (Stroke Patients Only)       Balance Overall balance assessment: Needs assistance   Sitting balance-Leahy Scale: Fair     Standing balance support: Bilateral upper extremity supported Standing balance-Leahy Scale: Poor Standing balance comment: unable to stand unaided                             Pertinent Vitals/Pain Pain Assessment: 0-10 Pain Score: 7  Pain Location: between R knee and hip and PSIS Pain Descriptors / Indicators: Dull;Aching Pain Intervention(s): Monitored during session    Home Living Family/patient expects to be discharged to:: Inpatient rehab Living Arrangements: Alone(son lives in town and checks periodically)  Type of Home: House Home Access: Stairs to enter   [employer] of Steps: 1 Home Layout: One level Home Equipment: Cane - single point      Prior Function Level of Independence: Independent with assistive device(s)         Comments: falls at home due to  medications, vision and recently due to R leg slipping out from under him; son takes to grocery shop, to medical appointments sometimes, and also gets rides through cancer [employer]     Hand Dominance   Dominant Hand: Right    Extremity/Trunk Assessment   Upper Extremity Assessment Upper Extremity Assessment: Overall WFL for tasks assessed    Lower Extremity Assessment Lower Extremity Assessment: RLE deficits/detail RLE Deficits / Details: AAROM WFL, strength hip flexion <2/5, knee extension 2-/5, ankle DF 0/5 and absent sensation to light touch in foot and lower leg, reports pain in R ant thigh RLE Sensation: decreased light touch;decreased proprioception       Communication   Communication: HOH  Cognition Arousal/Alertness: Awake/alert Behavior During Therapy: WFL for tasks assessed/performed Overall Cognitive Status: No family/caregiver present to determine baseline cognitive functioning                                 General Comments: patient tangential, difficulty answering questions directly      General Comments General comments (skin integrity, edema, etc.): Patient able to read some, but reports loses his place, already has resources through [employer] [facility] [employer] [facility].    Exercises     Assessment/Plan    PT Assessment Patient needs continued PT services  PT Problem List Decreased strength;Decreased mobility;Decreased safety awareness;Decreased activity tolerance;Decreased balance;Decreased knowledge of use of DME;Pain;Decreased knowledge of precautions;Impaired sensation       PT Treatment Interventions DME instruction;Therapeutic activities;Patient/family education;Therapeutic exercise;Balance training;Wheelchair mobility training;Functional mobility training;Neuromuscular re-education    PT Goals (Current goals can be found in the Care Plan section)  Acute Rehab PT Goals Patient Stated Goal: To return to independent PT Goal Formulation: With  patient Time For Goal Achievement: 09/01/17 Potential to Achieve Goals: Fair    Frequency Min 3X/week   Barriers to discharge Decreased caregiver support      Co-evaluation               AM-PAC PT "6 Clicks" Daily Activity  Outcome Measure Difficulty turning over in bed (including adjusting bedclothes, sheets and blankets)?: Unable Difficulty moving from lying on back to sitting on the side of the bed? : Unable Difficulty sitting down on and standing up from a chair with arms (e.g., wheelchair, bedside commode, etc,.)?: Unable Help needed moving to and from a bed to chair (including a wheelchair)?: A Lot Help needed walking in hospital room?: A Lot Help needed climbing 3-5 steps with a railing? : Total 6 Click Score: 8    End of Session Equipment Utilized During Treatment: Gait belt Activity Tolerance: Patient limited by fatigue Patient left: in chair;with chair alarm set Nurse Communication: Mobility status PT Visit Diagnosis: Muscle weakness (generalized) (M62.81);History of falling (Z91.81);Other abnormalities of gait and mobility (R26.89);Pain Pain - Right/Left: Right Pain - part of body: Leg    Time: 8527-7824 PT Time Calculation (min) (ACUTE ONLY): 42 min   Charges:   PT Evaluation $PT Eval Moderate Complexity: 1 Mod PT Treatments $Therapeutic Activity: 23-37 mins   PT G CodesMagda Kiel, [place] [phone] 08/18/2017  Reginia Naas 08/18/2017, 11:45 AM

## 2017-08-19 ENCOUNTER — Ambulatory Visit
Admit: 2017-08-19 | Discharge: 2017-08-19 | Disposition: A | Discharge status: Home / Self Care | Payer: Medicare Other | Attending: Radiation Oncology | Admitting: Radiation Oncology

## 2017-08-19 LAB — CBC
HCT: 28.7 % — ABNORMAL LOW (ref 39.0–52.0)
Hemoglobin: 8.7 g/dL — ABNORMAL LOW (ref 13.0–17.0)
MCH: 25.6 pg — ABNORMAL LOW (ref 26.0–34.0)
MCHC: 30.3 g/dL (ref 30.0–36.0)
MCV: 84.4 fL (ref 78.0–100.0)
PLATELETS: 149 10*3/uL — AB (ref 150–400)
RBC: 3.4 MIL/uL — ABNORMAL LOW (ref 4.22–5.81)
RDW: 19.6 % — AB (ref 11.5–15.5)
WBC: 10.4 10*3/uL (ref 4.0–10.5)

## 2017-08-19 LAB — BASIC METABOLIC PANEL
ANION GAP: 10 (ref 5–15)
BUN: 32 mg/dL — ABNORMAL HIGH (ref 6–20)
CALCIUM: 9.2 mg/dL (ref 8.9–10.3)
CO2: 28 mmol/L (ref 22–32)
CREATININE: 1.06 mg/dL (ref 0.61–1.24)
Chloride: 100 mmol/L — ABNORMAL LOW (ref 101–111)
Glucose, Bld: 127 mg/dL — ABNORMAL HIGH (ref 65–99)
Potassium: 4.3 mmol/L (ref 3.5–5.1)
Sodium: 138 mmol/L (ref 135–145)

## 2017-08-19 NOTE — Progress Notes (Signed)
PROGRESS NOTE    Christian Munoz  RKY:[id number] DOB: November 21, 1934 DOA: 08/12/2017 PCP: Christian Chandler, NP   Brief Narrative:  82 y.o.malewith medical history significant of Stage 3A small cell lung cancer completed concurrent chemotherapy/radiation in June 2018), atrial fibrillation and pulmonary embolism 1 month ago currently on Eliquis, balance difficulties over the past 2 weeks with falls, calf pain right leg pain and severe back pain which has gotten worse over the past 2 weeks and was relieved by nothing, he was admitted for further workup. Finally MRI after multiple attempts was completed under anesthesia on admission, noted extensive extraosseous epidural tumor extending through the thoraco lumbar spine, severe central canal stenosis at some levels, and foraminal stenosis at others, no evidence of spinal cord compression noted on MRI. Rad Onc consulted, s/p for XRT/Simulation, Tx to Christian Munoz, started XRT Onc Dr.Mohamed notified  Assessment & Plan:   Principal Problem:   Leg weakness Active Problems:   Hyperlipidemia   Hypertension   Benign prostatic hyperplasia with urinary obstruction   History of fall   Goals of care, counseling/discussion   Atrial fibrillation, chronic (HCC)   Acute lower UTI   Small cell lung cancer in adult [facility])   Acute back pain less than 4 weeks duration   Fall   Radicular leg pain   Encounter for palliative care   1. Extensive Thoracolumbar extraosseous epidural metastasis -presenting with back pain/leg pain and leg weakness  -secondary to extensive thoracolumbar extraosseous epidural tumor, no spinal cord compression -Continue IV Decadron 4 mg every 12 -Patient with known stage IIIa small cell lung cancer status post concurrent chemoradiation in June 2 0 18 subsequently under observation per Dr. Julien Munoz, now stage IV disease based on above MRI findings -Radiation oncology consult by Dr. Lisbeth Munoz greatly appreciated, s/p XRT simulation  11/5 and treatment started for 10Rx -Tx to Christian Munoz long, pain suboptimally controlled, continue morphine IR 80milligrams every 4 hours PRN, started fentanyl patch -continue lidocaine patch and gabapentin , physical therapy evaluation -Palliative consult requested per patient request, consult appreciated, plan for possible SNF with palliative care FU in few days [ ]  discussed LE weakness with neurosurgery as this seems to be getting worse, they will see him on 11/8.   2. Acute lower urinary tract infection: Recently completed multiple antibiotic courses, urine culture from 10/10 with enterococcus sensitive to ampicillin and vancomycin  -Cultures with enterococcus again -Completed 5 day course of antibiotics  3. Falls in the past 2 weeks: -Due to problem #1  -PT  4. Chronic atrial fibrillation: - resumed Eliquis - in sinus rhythm, rate controlled at this time   7.stage IV small cell lung cancer  - previously was stage IIIa status post chemoradiation in June 2 018  - now metastatic with above MRI findings  - radiation oncology consult, called and discussed with Dr. Julien Munoz 11/5 - palliative consulted per patient request   7. Hyperlipidemia:Continue home medications.  8. Hypertension:Continue home regimen.  9. Benign prostatic hyperplasia with urinary obstruction: -stable, continue flomax  10. Hemorrhoids:  Hx of hemorrhoids and noted blood in depends Christian Munoz few days ago he thinks was due to this.  H/H stable.  Resolved at this point.  Discussed rectal steroid which he declines.  Will ctm.   11. Goals of care counseling and discussion: -DO NOT RESUSCITATE, palliative following   DVT prophylaxis: eliquis Code Status: DNR Family Communication: none at bedside Disposition Plan: (pending   Consultants:   Palliative and radiation oncology  Procedures: (Don't  include imaging studies which can be auto populated. Include things that cannot be auto populated i.e. Echo,  Carotid and venous dopplers, Foley, Bipap, HD, tubes/drains, wound vac, central lines etc)  Radiation therapy  Antimicrobials: (specify start and planned stop date. Auto populated tables are space occupying and do not give end dates) Anti-infectives (From admission, onward)   Start     Dose/Rate Route Frequency Ordered Stop   08/16/17 1400  amoxicillin (AMOXIL) capsule 250 mg     250 mg Oral Every 8 hours 08/16/17 1239 08/18/17 2245   08/16/17 1200  ampicillin (PRINCIPEN) capsule 250 mg  Status:  Discontinued     250 mg Oral Every 6 hours 08/16/17 1143 08/16/17 1239   08/14/17 0600  ampicillin (OMNIPEN) 1 g in sodium chloride 0.9 % 50 mL IVPB  Status:  Discontinued     1 g 150 mL/hr over 20 Minutes Intravenous Every 6 hours 08/13/17 1242 08/16/17 1143   08/13/17 2300  vancomycin (VANCOCIN) IVPB 1000 mg/200 mL premix  Status:  Discontinued     1,000 mg 200 mL/hr over 60 Minutes Intravenous Every 12 hours 08/13/17 1224 08/13/17 1228   08/13/17 1230  vancomycin (VANCOCIN) IVPB 1000 mg/200 mL premix  Status:  Discontinued     1,000 mg 200 mL/hr over 60 Minutes Intravenous Every 12 hours 08/13/17 1223 08/13/17 1224   08/13/17 1100  vancomycin (VANCOCIN) IVPB 1000 mg/200 mL premix     1,000 mg 200 mL/hr over 60 Minutes Intravenous  Once 08/13/17 1055 08/13/17 1233         Subjective: Still with weakness.  Seems worse.   No saddle anesthesia or incontinence.  Objective: Vitals:   08/18/17 0506 08/18/17 1405 08/18/17 2100 08/19/17 0416  BP: (!) 154/92 133/85 132/78 (!) 145/92  Pulse: 87 99 89 80  Resp: 18 16 18 18   Temp: 98.3 F (36.8 C) 98 F (36.7 C) 98.9 F (37.2 C) 98 F (36.7 C)  TempSrc: Oral Oral Oral Oral  SpO2: 98% 93% 97% 92%  Weight:      Height:        Intake/Output Summary (Last 24 hours) at 08/19/2017 1020 Last data filed at 08/19/2017 0902 Gross per 24 hour  Intake 720 ml  Output 1100 ml  Net -380 ml   Filed Weights   08/13/17 1513  Weight: 72.6 kg (160  lb 0 oz)    Examination:  General: No acute distress. Cardiovascular: Heart sounds show Christian Munoz regular rate, and rhythm. No gallops or rubs. No murmurs. No JVD. Lungs: Clear to auscultation bilaterally with good air movement. No rales, rhonchi or wheezes. Abdomen: Soft, nontender, nondistended with normal active bowel sounds. No masses. No hepatosplenomegaly. Neurological: Alert and oriented 3. RLE unable to lift leg against gravity.  Decreased sensation to RLE.  Skin: Warm and dry. No rashes or lesions. Extremities: No clubbing or cyanosis. No edema. Pedal pulses 2+. Psychiatric: Mood and affect are normal. Insight and judgment are appropriate.  Data Reviewed: I have personally reviewed following labs and imaging studies  CBC: Recent Labs  Lab 08/12/17 2129 08/14/17 0631 08/16/17 0613 08/19/17 0414  WBC 7.2 7.4 8.3 10.4  NEUTROABS 6.0  --   --   --   HGB 9.2* 8.6* 8.6* 8.7*  HCT 30.6* 28.7* 28.8* 28.7*  MCV 84.3 84.2 85.2 84.4  PLT 192 155 139* 518*   Basic Metabolic Panel: Recent Labs  Lab 08/12/17 2129 08/14/17 0631 08/16/17 0613 08/19/17 0414  NA 137 136 139 138  K 4.0 4.3 3.7 4.3  CL 101 99* 101 100*  CO2 25 24 27 28   GLUCOSE 86 130* 136* 127*  BUN 19 20 24* 32*  CREATININE 1.03 1.09 0.99 1.06  CALCIUM 9.3 8.8* 8.9 9.2   GFR: Estimated Creatinine Clearance: 57.1 mL/min (by C-G formula based on SCr of 1.06 mg/dL). Liver Function Tests: No results for input(s): AST, ALT, ALKPHOS, BILITOT, PROT, ALBUMIN in the last 168 hours. No results for input(s): LIPASE, AMYLASE in the last 168 hours. No results for input(s): AMMONIA in the last 168 hours. Coagulation Profile: No results for input(s): INR, PROTIME in the last 168 hours. Cardiac Enzymes: No results for input(s): CKTOTAL, CKMB, CKMBINDEX, TROPONINI in the last 168 hours. BNP (last 3 results) No results for input(s): PROBNP in the last 8760 hours. HbA1C: No results for input(s): HGBA1C in the last 72  hours. CBG: No results for input(s): GLUCAP in the last 168 hours. Lipid Profile: No results for input(s): CHOL, HDL, LDLCALC, TRIG, CHOLHDL, LDLDIRECT in the last 72 hours. Thyroid Function Tests: No results for input(s): TSH, T4TOTAL, FREET4, T3FREE, THYROIDAB in the last 72 hours. Anemia Panel: No results for input(s): VITAMINB12, FOLATE, FERRITIN, TIBC, IRON, RETICCTPCT in the last 72 hours. Sepsis Labs: No results for input(s): PROCALCITON, LATICACIDVEN in the last 168 hours.  Recent Results (from the past 240 hour(s))  Urine culture     Status: Abnormal   Collection Time: 08/13/17  2:15 AM  Result Value Ref Range Status   Specimen Description URINE, CLEAN CATCH  Final   Special Requests Normal  Final   Culture >=100,000 COLONIES/mL ENTEROCOCCUS FAECALIS ([place])  Final   Report Status 08/15/2017 FINAL  Final   Organism ID, Bacteria ENTEROCOCCUS FAECALIS ([facility]era [facility])  Final      Susceptibility   Enterococcus faecalis - MIC*    AMPICILLIN <=2 SENSITIVE Sensitive     LEVOFLOXACIN 1 SENSITIVE Sensitive     NITROFURANTOIN 32 SENSITIVE Sensitive     VANCOMYCIN 2 SENSITIVE Sensitive     * >=100,000 COLONIES/mL ENTEROCOCCUS FAECALIS         Radiology Studies: No results found.      Scheduled Meds: . apixaban  5 mg Oral BID  . aspirin EC  81 mg Oral Daily  . dexamethasone  4 mg Intravenous Q12H  . escitalopram  5 mg Oral Daily  . fentaNYL  25 mcg Transdermal Q72H  . gabapentin  300 mg Oral TID  . lidocaine  1 patch Transdermal Q24H  . multivitamin with minerals  1 tablet Oral Daily  . omega-3 acid ethyl esters  2 g Oral Daily  . pantoprazole  20 mg Oral Daily  . QUEtiapine  25 mg Oral QHS  . simvastatin  20 mg Oral q1800  . sodium chloride flush  3 mL Intravenous Q12H  . sodium chloride flush  3 mL Intravenous Q12H  . tamsulosin  0.4 mg Oral Daily   Continuous Infusions: . sodium chloride 250 mL (08/14/17 0018)  . lactated ringers 10 mL/hr at 08/13/17 1514     LOS: 5  days    Time spent: over 16 min    Fayrene Helper, MD [facility] Pager [phone]   If 7PM-7AM, please contact night-coverage [email or web address] Password [email or web address] 08/19/2017, 10:20 AM

## 2017-08-19 NOTE — Progress Notes (Signed)
I did come by to see Mr. Christian Munoz. He has no interest in undergoing surgery of any type. He does feel he is slightly weaker, but has no interest in surgery.

## 2017-08-19 NOTE — Clinical Social Work Placement (Signed)
   CLINICAL SOCIAL WORK PLACEMENT  NOTE  Date:  08/19/2017  Patient Details  Name: Murrel Bertram MRN: [id number] Date of Birth: 10-Feb-1935  Clinical Social Work is seeking post-discharge placement for this patient at the [place] level of care (*CSW will initial, date and re-position this form in  chart as items are completed):  Yes   Patient/family provided with [place] Work Department's list of facilities offering this level of care within the geographic area requested by the patient (or if unable, by the patient's family).  Yes   Patient/family informed of their freedom to choose among providers that offer the needed level of care, that participate in Medicare, Medicaid or managed care program needed by the patient, have an available bed and are willing to accept the patient.  Yes   Patient/family informed of [place]'s ownership interest in [facility] and [facility], as well as of the fact that they are under no obligation to receive care at these facilities.  PASRR submitted to EDS on 08/18/17     PASRR number received on       Existing PASRR number confirmed on       FL2 transmitted to all facilities in geographic area requested by pt/family on 08/18/17     FL2 transmitted to all facilities within larger geographic area on       Patient informed that his/her managed care company has contracts with or will negotiate with certain facilities, including the following:        Yes   Patient/family informed of bed offers received.  Patient chooses bed at       Physician recommends and patient chooses bed at      Patient to be transferred to   on  .  Patient to be transferred to facility by       Patient family notified on   of transfer.  Name of family member notified:        PHYSICIAN       Additional Comment:    _______________________________________________ Burnis Medin, LCSW 08/19/2017, 2:59 PM

## 2017-08-20 ENCOUNTER — Ambulatory Visit
Admit: 2017-08-20 | Discharge: 2017-08-20 | Disposition: A | Discharge status: Home / Self Care | Payer: Medicare Other | Attending: Radiation Oncology | Admitting: Radiation Oncology

## 2017-08-20 DIAGNOSIS — M549 Dorsalgia, unspecified: Secondary | ICD-10-CM

## 2017-08-20 DIAGNOSIS — F329 Major depressive disorder, single episode, unspecified: Secondary | ICD-10-CM

## 2017-08-20 LAB — CBC
HEMATOCRIT: 30.2 % — AB (ref 39.0–52.0)
HEMOGLOBIN: 9.3 g/dL — AB (ref 13.0–17.0)
MCH: 25.9 pg — AB (ref 26.0–34.0)
MCHC: 30.8 g/dL (ref 30.0–36.0)
MCV: 84.1 fL (ref 78.0–100.0)
Platelets: 157 10*3/uL (ref 150–400)
RBC: 3.59 MIL/uL — ABNORMAL LOW (ref 4.22–5.81)
RDW: 19.7 % — AB (ref 11.5–15.5)
WBC: 10.8 10*3/uL — ABNORMAL HIGH (ref 4.0–10.5)

## 2017-08-20 LAB — BASIC METABOLIC PANEL
Anion gap: 13 (ref 5–15)
BUN: 28 mg/dL — AB (ref 6–20)
CALCIUM: 9.1 mg/dL (ref 8.9–10.3)
CHLORIDE: 94 mmol/L — AB (ref 101–111)
CO2: 27 mmol/L (ref 22–32)
CREATININE: 0.9 mg/dL (ref 0.61–1.24)
GFR calc Af Amer: >60 mL/min (ref >60)
GFR calc non Af Amer: >60 mL/min (ref >60)
GLUCOSE: 134 mg/dL — AB (ref 65–99)
Potassium: 4.1 mmol/L (ref 3.5–5.1)
Sodium: 134 mmol/L — ABNORMAL LOW (ref 135–145)

## 2017-08-20 LAB — URINALYSIS, ROUTINE W REFLEX MICROSCOPIC
BACTERIA UA: NONE SEEN
BILIRUBIN URINE: NEGATIVE
Glucose, UA: NEGATIVE mg/dL
Ketones, ur: NEGATIVE mg/dL
Leukocytes, UA: NEGATIVE
Nitrite: NEGATIVE
PH: 7 (ref 5.0–8.0)
Protein, ur: NEGATIVE mg/dL
SPECIFIC GRAVITY, URINE: 1.011 (ref 1.005–1.030)
SQUAMOUS EPITHELIAL / LPF: NONE SEEN

## 2017-08-20 MED ORDER — DEXAMETHASONE 4 MG PO TABS
4.0000 mg | ORAL_TABLET | Freq: Two times a day (BID) | ORAL | Status: DC
  Administered 2017-08-20 – 2017-08-21 (×2): 4 mg via ORAL
  Filled 2017-08-20 (×2): qty 1

## 2017-08-20 MED ORDER — OLANZAPINE 5 MG PO TABS
5.0000 mg | ORAL_TABLET | Freq: Every day | ORAL | Status: DC
  Administered 2017-08-20: 5 mg via ORAL
  Filled 2017-08-20: qty 1

## 2017-08-20 MED ORDER — ESCITALOPRAM OXALATE 10 MG PO TABS
10.0000 mg | ORAL_TABLET | Freq: Every day | ORAL | Status: DC
  Administered 2017-08-21: 10 mg via ORAL
  Filled 2017-08-20: qty 1

## 2017-08-20 NOTE — Progress Notes (Signed)
[facility] Therapy Treatment Record Phone [phone]   Radiation Therapy was administered to Lossie Faes on: 08/20/2017  12:44 PM and was treatment # 5 out of a planned course of 10 treatments.  Radiation Treatment  1). Beam photons with 6-10 energy  2). Brachytherapy None  3). Stereotactic Radiosurgery None  4). Other Radiation None     Kikuye Korenek A Cherrill Scrima, RT (T)

## 2017-08-20 NOTE — Progress Notes (Signed)
Physical Therapy Treatment Patient Details Name: Christian Munoz MRN: [id number] DOB: Nov 14, 1934 Today's Date: 08/20/2017    History of Present Illness PAtient is an 82 y/o male admitted with history significant of Stage 3A small cell lung cancer completed concurrent chemotherapy/radiation in June 2018), atrial fibrillation and pulmonary embolism 1 month ago currently on Eliquis, balance difficulties over the past 2 weeks with falling from a sitting position on 2 occasions at home. Now with right leg pain and severe back pain. MRI of the thoracic and lumbar spines showed severe central canal stenosis at L3 secondary to extraosseous posterior epidural tumor.  There was also extraosseous tumor extends into the right foramen at L4-5 with severe right foraminal stenosis.      PT Comments    Pt with some STM deficits today(does not recall having had any meals today, oriented to date/situation, does not recall his RN ) appears  weaker RLE this session than last, very motivated to work with PT and pleased to be up in recliner;   Follow Up Recommendations  SNF;Other (comment);Supervision/Assistance - 24 hour(per SW note pt  planning for SNF)     Equipment Recommendations  Rolling walker with 5" wheels;Wheelchair (measurements PT);Wheelchair cushion (measurements PT)    Recommendations for Other Services       Precautions / Restrictions Precautions Precautions: Fall Precaution Comments: decreased vision and hearing Restrictions Weight Bearing Restrictions: No    Mobility  Bed Mobility Overal bed mobility: Needs Assistance Bed Mobility: Rolling;Sidelying to Sit Rolling: Min assist Sidelying to sit: Mod assist       General bed mobility comments: increased time and max cues for technique, assist for flexing R LE and pulling to roll and for legs off bed and trunk to sit  Transfers Overall transfer level: Needs assistance Equipment used: Rolling walker (2 wheeled) Transfers: Sit  to/from [facility] Sit to Stand: +2 physical assistance;Mod assist Stand pivot transfers: +2 physical assistance;Mod assist       General transfer comment: attempt initial sit to stand with RW, pt unable to wt shift any wt on to  LEs, difficulty processign cues to scoot back on bed; sit to stand with bil 2 person assist and knee and bil knee support, repeated x2 for instruction and activity tolerance; +2 assist to perform stand pivot to pt left side, cues for technique and R knee/foot/ankle support throughout   Ambulation/Gait                 Stairs            Wheelchair Mobility    Modified Rankin (Stroke Patients Only)       Balance Overall balance assessment: Needs assistance Sitting-balance support: No upper extremity supported;Feet supported Sitting balance-Leahy Scale: Fair       Standing balance-Leahy Scale: Zero Standing balance comment: unable to stand unaided                            Cognition Arousal/Alertness: Awake/alert Behavior During Therapy: WFL for tasks assessed/performed Overall Cognitive Status: No family/caregiver present to determine baseline cognitive functioning                                 General Comments: pt seems to have STM deficits, did not recall eating breakfast, did not recall RN      Exercises      General Comments  Pertinent Vitals/Pain Pain Assessment: No/denies pain    Home Living                      Prior Function            PT Goals (current goals can now be found in the care plan section) Acute Rehab PT Goals Patient Stated Goal: To return to independent PT Goal Formulation: With patient Time For Goal Achievement: 09/01/17 Potential to Achieve Goals: Fair Progress towards PT goals: Progressing toward goals    Frequency    Min 3X/week      PT Plan Current plan remains appropriate    Co-evaluation              AM-PAC PT "6  Clicks" Daily Activity  Outcome Measure  Difficulty turning over in bed (including adjusting bedclothes, sheets and blankets)?: Unable Difficulty moving from lying on back to sitting on the side of the bed? : Unable Difficulty sitting down on and standing up from a chair with arms (e.g., wheelchair, bedside commode, etc,.)?: Unable Help needed moving to and from a bed to chair (including a wheelchair)?: A Lot Help needed walking in hospital room?: Total Help needed climbing 3-5 steps with a railing? : Total 6 Click Score: 7    End of Session Equipment Utilized During Treatment: Gait belt Activity Tolerance: Patient tolerated treatment well Patient left: in chair;with call bell/phone within reach;with chair alarm set Nurse Communication: Mobility status PT Visit Diagnosis: Muscle weakness (generalized) (M62.81);History of falling (Z91.81);Other abnormalities of gait and mobility (R26.89);Pain     Time: 1354-1425 PT Time Calculation (min) (ACUTE ONLY): 31 min  Charges:  $Therapeutic Activity: 23-37 mins                    G Codes:          Makaylin Carlo September 19, 2017, 2:39 PM

## 2017-08-20 NOTE — Clinical Social Work Placement (Addendum)
10:28 AM Patient is medically stable for discharge. Patient going to [facility] and confirmed with facility. Bed: 215 Report: [phone] Patient will transport by EMS.  LCSW will notify family once paperwork has been completed.  Will follow up.   2:09 PM Paperwork completed, family has completed paperwork at facility. Patient to transport by EMS. No other needs. DC to SNF [facility] today. CLINICAL SOCIAL WORK PLACEMENT  NOTE  Date:  08/20/2017  Patient Details  Name: Christian Munoz MRN: [id number] Date of Birth: December 13, 1934  Clinical Social Work is seeking post-discharge placement for this patient at the [place] level of care (*CSW will initial, date and re-position this form in  chart as items are completed):  Yes   Patient/family provided with [place] Work Department's list of facilities offering this level of care within the geographic area requested by the patient (or if unable, by the patient's family).  Yes   Patient/family informed of their freedom to choose among providers that offer the needed level of care, that participate in Medicare, Medicaid or managed care program needed by the patient, have an available bed and are willing to accept the patient.  Yes   Patient/family informed of [place]'s ownership interest in [facility] and [facility], as well as of the fact that they are under no obligation to receive care at these facilities.  PASRR submitted to EDS on 08/18/17     PASRR number received on       Existing PASRR number confirmed on       FL2 transmitted to all facilities in geographic area requested by pt/family on 08/18/17     FL2 transmitted to all facilities within larger geographic area on       Patient informed that his/her managed care company has contracts with or will negotiate with certain facilities, including the following:  [place] [facility]     Yes   Patient/family informed of bed  offers received.  Patient chooses bed at [place] recommends and patient chooses bed at      Patient to be transferred to [facility] on 08/21/17.  Patient to be transferred to facility by      EMS  Patient family notified on   of transfer. Family in room   Name of family member notified:        PHYSICIAN Please sign DNR, Please prepare priority discharge summary, including medications     Additional Comment:    _______________________________________________ Lia Hopping, LCSW 08/20/2017, 3:00 PM

## 2017-08-20 NOTE — Progress Notes (Signed)
Daily Progress Note   Patient Name: Christian Munoz       Date: 08/20/2017 DOB: 1935/02/13  Age: 82 y.o. MRN#: [id number] Attending Physician: Christian Munoz., * Primary Care Physician: Christian Chandler, NP Admit Date: 08/12/2017  Reason for Consultation/Follow-up: Establishing goals of care  Subjective:  Met with Christian Munoz briefly when he was working with PT and again later in the day.  He reports that his pain has been well controlled on current regimen.  His only complaint is that he has been feeling more "down."  He is also discussed this with Christian Munoz who called and talk with me about it as well.  In talking with him, he is not actively suicidal but reports that it is difficult at times coming to terms with the fact that he has incurable illness that is not going to get better.  He states that he is up and down periods with his feelings in regard to this.  He does not want to "get into a lot" with medications but is agreeable to changes of I think they would be helpful.  Still having regular BM.  See below:   Length of Stay: 6  Current Medications: Scheduled Meds:  . apixaban  5 mg Oral BID  . aspirin EC  81 mg Oral Daily  . dexamethasone  4 mg Intravenous Q12H  . [START ON 08/21/2017] escitalopram  10 mg Oral Daily  . fentaNYL  25 mcg Transdermal Q72H  . gabapentin  300 mg Oral TID  . lidocaine  1 patch Transdermal Q24H  . multivitamin with minerals  1 tablet Oral Daily  . OLANZapine  5 mg Oral QHS  . omega-3 acid ethyl esters  2 g Oral Daily  . pantoprazole  20 mg Oral Daily  . simvastatin  20 mg Oral q1800  . sodium chloride flush  3 mL Intravenous Q12H  . sodium chloride flush  3 mL Intravenous Q12H  . tamsulosin  0.4 mg Oral Daily    Continuous  Infusions: . sodium chloride 250 mL (08/14/17 0018)  . lactated ringers 10 mL/hr at 08/13/17 1514    PRN Meds: sodium chloride, acetaminophen **OR** acetaminophen, HYDROmorphone (DILAUDID) injection, morphine, ondansetron **OR** ondansetron (ZOFRAN) IV, sodium chloride flush PPS 40%  Physical Exam  Awake alert Still with ongoing weakness in RLE Clear Abdomen soft S1 S2 Pain in back Mood congruent  Vital Signs: BP 126/65 (BP Location: Right Arm)   Pulse 89   Temp 98 F (36.7 C) (Oral)   Resp 18   Ht _0  (1.778 m)   Wt 72.6 kg (160 lb 0 oz)   SpO2 95%   BMI 22.96 kg/m  SpO2: SpO2: 95 % O2 Device: O2 Device: Not Delivered O2 Flow Rate: O2 Flow Rate (L/min): 2 L/min  Intake/output summary:   Intake/Output Summary (Last 24 hours) at 08/20/2017 1543 Last data filed at 08/20/2017 1000 Gross per 24 hour  Intake 792.83 ml  Output 850 ml  Net -57.17 ml   LBM: Last BM Date: 08/18/17 Baseline Weight: Weight: 72.6 kg (160 lb 0 oz) Most recent weight: Weight: 72.6 kg (160 lb 0 oz)       Palliative Assessment/Data:    Flowsheet Rows     Most Recent Value  Intake Tab  Referral Department  Hospitalist  Unit at Time of Referral  Cardiac/Telemetry Unit  Palliative Care Primary Diagnosis  Cancer  Palliative Care Type  New Palliative care  Reason for referral  Pain, Clarify Goals of Care, Counsel Regarding Hospice  Date first seen by Palliative Care  08/14/17  Clinical Assessment  Palliative Performance Scale Score  30%  Pain Max last 24 hours  6  Pain Min Last 24 hours  5  Dyspnea Max Last 24 Hours  4  Dyspnea Min Last 24 hours  3  Nausea Max Last 24 Hours  2  Nausea Min Last 24 Hours  1  Anxiety Max Last 24 Hours  5  Anxiety Min Last 24 Hours  4  Psychosocial & Spiritual Assessment  Palliative Care Outcomes  Patient/Family meeting held?  Yes  Who was at the meeting?  patient and son.   Palliative Care Outcomes  Clarified goals of care, Improved pain  interventions      Patient Active Problem List   Diagnosis Date Noted  . Spine metastasis ([place]) 08/16/2017  . Fall   . Radicular leg pain   . Encounter for palliative care   . Leg weakness 08/13/2017  . Acute back pain less than 4 weeks duration 08/13/2017  . Acute lower UTI   . Small cell lung cancer in adult [facility])   . Anemia   . Hypoxemia   . Pulmonary embolism ([place]) 07/13/2017  . Atrial fibrillation, chronic ([place]) 07/13/2017  . Lower urinary tract infectious disease 07/13/2017  . Chronic pain 07/13/2017  . Antineoplastic chemotherapy induced anemia 03/01/2017  . Cancer associated pain 02/08/2017  . Dysuria 01/18/2017  . Fever 01/13/2017  . Port catheter in place 01/12/2017  . Small cell lung cancer, left ([place]) 11/30/2016  . Goals of care, counseling/discussion 11/30/2016  . Encounter for antineoplastic chemotherapy 11/30/2016  . History of fall 02/28/2016  . Hypertension 12/04/2014  . Benign prostatic hyperplasia with urinary obstruction 12/04/2014  . Depression 12/04/2014  . Hyperlipidemia 01/06/2013  . Reflux esophagitis   . Osteoarthrosis, unspecified whether generalized or localized, unspecified site     Palliative Care Assessment & Plan   Patient Profile:   Assessment:  small cell lung cancer Extensive thoraco lumbar osseous metastatic burden Uncontrolled pain Weakness in RLE UTI Has chronic A fib BPH History of falls and weakness lately.   Recommendations/Plan: -Continue radiation.  We discussed pretreating with rescue med prior to going for treatment if pain continues to be an issue -  Pain well controlled with current rescue MSIR this AM.  Continue with current pain and adjuvant medication regimen.  -Constipation currently not an issue.  Miralax as needed if constipation becomes an issue. -Plan for trial of rehab to see how much functional status he can gain while continuing his course of radiation therapy.  If he is making functional gains, I  recommended that he continue with rehab even after he finishes radiation until he hits a plateau or completes his rehab days.  He may then consider electing his hospice benefit at that time.  I recommended palliative to follow him at SNF to assist in decision making moving forward. -Depression: Currently on Lexapro.  I did increase this to 10 mg daily.  In addition to this he reports he has not been sleeping as well.  We discussed trying a trial of Zyprexa rather than Seroquel and that he is agreeable.  I therefore transition from Seroquel to Zyprexa 5 mg nightly.    Code Status:    Code Status Orders  (From admission, onward)        Start     Ordered   08/13/17 1216  Do not attempt resuscitation (DNR)  Continuous    Question Answer Comment  In the event of cardiac or respiratory ARREST Do not call a "code blue"   In the event of cardiac or respiratory ARREST Do not perform Intubation, CPR, defibrillation or ACLS   In the event of cardiac or respiratory ARREST Use medication by any route, position, wound care, and other measures to relive pain and suffering. May use oxygen, suction and manual treatment of airway obstruction as needed for comfort.      08/13/17 1220    Code Status History    Date Active Date Inactive Code Status Order ID Comments User Context   07/13/2017 11:30 07/15/2017 18:13 Full Code [id number]  Waldemar Dickens, MD ED   01/14/2017 13:41 07/13/2017 03:44 DNR [id number]  Christian Chandler, NP Outpatient   04/04/2015 14:44 10/23/2016 15:07 DNR [id number]  Christian Chandler, NP Outpatient    Advance Directive Documentation     Most Recent Value  Type of Advance Directive  Healthcare Power of [place], Living will, Out of facility DNR (pink MOST or yellow form)  Pre-existing out of facility DNR order (yellow form or pink MOST form)  Yellow form placed in chart (order not valid for inpatient use)  "MOST" Form in Place?  No data       Prognosis:   guarded   Discharge  Planning:  [place] for rehab with Palliative care service follow-up  Care plan was discussed with  Patient.  Thank you for allowing the Palliative Medicine Team to assist in the care of this patient.   Total Time 30 Prolonged Time Billed  no       Greater than 50%  of this time was spent counseling and coordinating care related to the above assessment and plan.  Micheline Rough, MD [facility] Team [phone]   Please contact Palliative Medicine Team phone at [phone] for questions and concerns.

## 2017-08-20 NOTE — Progress Notes (Signed)
Patient and son provided with bed offers 11/8. Patient referred CSW to talk with his son about SNF choice. CSW spoke with Vicente Males, he plans to visit facilities today and inform CSW with choice.   Kathrin Greathouse, Latanya Presser, MSW Clinical Social Worker  [phone] 08/20/2017  10:52 AM

## 2017-08-20 NOTE — Progress Notes (Signed)
PROGRESS NOTE    Christian Munoz  ION:[id number] DOB: August 09, 1935 DOA: 08/12/2017 PCP: [name], NP   Brief Narrative:  82 y.o.malewith medical history significant of Stage 3A small cell lung cancer completed concurrent chemotherapy/radiation in June 2018), atrial fibrillation and pulmonary embolism 1 month ago currently on Eliquis, balance difficulties over the past 2 weeks with falls, calf pain right leg pain and severe back pain which has gotten worse over the past 2 weeks and was relieved by nothing, he was admitted for further workup. Finally MRI after multiple attempts was completed under anesthesia on admission, noted extensive extraosseous epidural tumor extending through the thoraco lumbar spine, severe central canal stenosis at some levels, and foraminal stenosis at others, no evidence of spinal cord compression noted on MRI. Rad Onc consulted, s/p for XRT/Simulation, Tx to [facility], started XRT Onc Dr.Mohamed notified  Assessment & Plan:   Principal Problem:   Leg weakness Active Problems:   Hyperlipidemia   Hypertension   Benign prostatic hyperplasia with urinary obstruction   History of fall   Goals of care, counseling/discussion   Atrial fibrillation, chronic (HCC)   Acute lower UTI   Small cell lung cancer in adult [facility])   Acute back pain less than 4 weeks duration   Fall   Radicular leg pain   Encounter for palliative care   1. Extensive Thoracolumbar extraosseous epidural metastasis -presenting with back pain/leg pain and leg weakness  -secondary to extensive thoracolumbar extraosseous epidural tumor, no spinal cord compression -Continue IV Decadron 4 mg every 12 -Patient with known stage IIIa small cell lung cancer status post concurrent chemoradiation in June 2 0 18 subsequently under observation per Dr. Julien Nordmann, now stage IV disease based on above MRI findings -Radiation oncology consult by Dr. Lisbeth Renshaw greatly appreciated, s/p XRT simulation  11/5 and treatment started for 10Rx -Tx to [facility] long, pain suboptimally controlled, continue morphine IR 9milligrams every 4 hours PRN, started fentanyl patch -continue lidocaine patch and gabapentin , physical therapy evaluation -Palliative consult requested per patient request, consult appreciated, plan for possible SNF with palliative care FU in few days [ ]  discussed LE weakness with neurosurgery as this seems to be getting worse, they saw him on 11/8, but not interested in any surgical procedure.  2. Acute lower urinary tract infection: Recently completed multiple antibiotic courses, urine culture from 10/10 with enterococcus sensitive to ampicillin and vancomycin  -Cultures with enterococcus again -Completed 5 day course of antibiotics Denies dysuria, but notes persistent freq.  Will repeat UA/cx.  3. Falls in the past 2 weeks: -Due to problem #1  -PT  4. Chronic atrial fibrillation: - resumed Eliquis - in sinus rhythm, rate controlled at this time   7.stage IV small cell lung cancer  - previously was stage IIIa status post chemoradiation in June 2 018  - now metastatic with above MRI findings  - radiation oncology consult, called and discussed with Dr. Julien Nordmann 11/5 - palliative consulted per patient request   7. Hyperlipidemia:Continue home medications.  8. Hypertension:Continue home regimen.  9. Benign prostatic hyperplasia with urinary obstruction: -stable, continue flomax  Leukocytosis: mild, ctm  10. Hemorrhoids:  Hx of hemorrhoids and noted blood in depends Makale Pindell few days ago he thinks was due to this.  H/H stable.  Resolved at this point.  Discussed rectal steroid which he declines.  Will ctm.   Depression:  Passive SI, noting that this is no way to live, no active SI.  Frustrated with his current situation,  being dependent on others, not being as independent as he was previously. Will have palliative see patient again and consider adjusting meds.    11. Goals of care counseling and discussion: -DO NOT RESUSCITATE, palliative following   DVT prophylaxis: eliquis Code Status: DNR Family Communication: none at bedside Disposition Plan: (pending   Consultants:   Palliative and radiation oncology  Procedures: (Don't include imaging studies which can be auto populated. Include things that cannot be auto populated i.e. Echo, Carotid and venous dopplers, Foley, Bipap, HD, tubes/drains, wound vac, central lines etc)  Radiation therapy  Antimicrobials: (specify start and planned stop date. Auto populated tables are space occupying and do not give end dates) Anti-infectives (From admission, onward)   Start     Dose/Rate Route Frequency Ordered Stop   08/16/17 1400  amoxicillin (AMOXIL) capsule 250 mg     250 mg Oral Every 8 hours 08/16/17 1239 08/18/17 2245   08/16/17 1200  ampicillin (PRINCIPEN) capsule 250 mg  Status:  Discontinued     250 mg Oral Every 6 hours 08/16/17 1143 08/16/17 1239   08/14/17 0600  ampicillin (OMNIPEN) 1 g in sodium chloride 0.9 % 50 mL IVPB  Status:  Discontinued     1 g 150 mL/hr over 20 Minutes Intravenous Every 6 hours 08/13/17 1242 08/16/17 1143   08/13/17 2300  vancomycin (VANCOCIN) IVPB 1000 mg/200 mL premix  Status:  Discontinued     1,000 mg 200 mL/hr over 60 Minutes Intravenous Every 12 hours 08/13/17 1224 08/13/17 1228   08/13/17 1230  vancomycin (VANCOCIN) IVPB 1000 mg/200 mL premix  Status:  Discontinued     1,000 mg 200 mL/hr over 60 Minutes Intravenous Every 12 hours 08/13/17 1223 08/13/17 1224   08/13/17 1100  vancomycin (VANCOCIN) IVPB 1000 mg/200 mL premix     1,000 mg 200 mL/hr over 60 Minutes Intravenous  Once 08/13/17 1055 08/13/17 1233         Subjective: "I just want to die" Doesn't want to hurt or harm self.  But frustrated with situation.  Feels this is no way to live.  Objective: Vitals:   08/19/17 0416 08/19/17 1400 08/19/17 2041 08/20/17 0556  BP: (!) 145/92 138/80  115/70 129/84  Pulse: 80 98 82 79  Resp: 18 18 18 18   Temp: 98 F (36.7 C) 98 F (36.7 C) 98.2 F (36.8 C) 98.2 F (36.8 C)  TempSrc: Oral Oral Oral Oral  SpO2: 92% 95% 93% 96%  Weight:      Height:        Intake/Output Summary (Last 24 hours) at 08/20/2017 0856 Last data filed at 08/19/2017 2043 Gross per 24 hour  Intake -  Output 1150 ml  Net -1150 ml   Filed Weights   08/13/17 1513  Weight: 72.6 kg (160 lb 0 oz)    Examination:  General: No acute distress. Cardiovascular: Heart sounds show Justina Bertini regular rate, and rhythm. No gallops or rubs. No murmurs. No JVD. Lungs: Clear to auscultation bilaterally with good air movement. No rales, rhonchi or wheezes. Abdomen: Soft, nontender, nondistended with normal active bowel sounds. No masses. No hepatosplenomegaly. Neurological: Alert and oriented 3. RLE unable to lift leg against gravity.  Skin: Warm and dry. No rashes or lesions. Extremities: No clubbing or cyanosis. No edema. Pedal pulses 2+. Psychiatric: Mood and affect are depressed. .  Data Reviewed: I have personally reviewed following labs and imaging studies  CBC: Recent Labs  Lab 08/14/17 0631 08/16/17 4332 08/19/17 0414 08/20/17 0410  WBC 7.4 8.3 10.4 10.8*  HGB 8.6* 8.6* 8.7* 9.3*  HCT 28.7* 28.8* 28.7* 30.2*  MCV 84.2 85.2 84.4 84.1  PLT 155 139* 149* 267   Basic Metabolic Panel: Recent Labs  Lab 08/14/17 0631 08/16/17 0613 08/19/17 0414 08/20/17 0410  NA 136 139 138 134*  K 4.3 3.7 4.3 4.1  CL 99* 101 100* 94*  CO2 24 27 28 27   GLUCOSE 130* 136* 127* 134*  BUN 20 24* 32* 28*  CREATININE 1.09 0.99 1.06 0.90  CALCIUM 8.8* 8.9 9.2 9.1   GFR: Estimated Creatinine Clearance: 67.2 mL/min (by C-G formula based on SCr of 0.9 mg/dL). Liver Function Tests: No results for input(s): AST, ALT, ALKPHOS, BILITOT, PROT, ALBUMIN in the last 168 hours. No results for input(s): LIPASE, AMYLASE in the last 168 hours. No results for input(s): AMMONIA in the  last 168 hours. Coagulation Profile: No results for input(s): INR, PROTIME in the last 168 hours. Cardiac Enzymes: No results for input(s): CKTOTAL, CKMB, CKMBINDEX, TROPONINI in the last 168 hours. BNP (last 3 results) No results for input(s): PROBNP in the last 8760 hours. HbA1C: No results for input(s): HGBA1C in the last 72 hours. CBG: No results for input(s): GLUCAP in the last 168 hours. Lipid Profile: No results for input(s): CHOL, HDL, LDLCALC, TRIG, CHOLHDL, LDLDIRECT in the last 72 hours. Thyroid Function Tests: No results for input(s): TSH, T4TOTAL, FREET4, T3FREE, THYROIDAB in the last 72 hours. Anemia Panel: No results for input(s): VITAMINB12, FOLATE, FERRITIN, TIBC, IRON, RETICCTPCT in the last 72 hours. Sepsis Labs: No results for input(s): PROCALCITON, LATICACIDVEN in the last 168 hours.  Recent Results (from the past 240 hour(s))  Urine culture     Status: Abnormal   Collection Time: 08/13/17  2:15 AM  Result Value Ref Range Status   Specimen Description URINE, CLEAN CATCH  Final   Special Requests Normal  Final   Culture >=100,000 COLONIES/mL ENTEROCOCCUS FAECALIS (Reka Wist)  Final   Report Status 08/15/2017 FINAL  Final   Organism ID, Bacteria ENTEROCOCCUS FAECALIS (Xela Oregel)  Final      Susceptibility   Enterococcus faecalis - MIC*    AMPICILLIN <=2 SENSITIVE Sensitive     LEVOFLOXACIN 1 SENSITIVE Sensitive     NITROFURANTOIN 32 SENSITIVE Sensitive     VANCOMYCIN 2 SENSITIVE Sensitive     * >=100,000 COLONIES/mL ENTEROCOCCUS FAECALIS         Radiology Studies: No results found.      Scheduled Meds: . apixaban  5 mg Oral BID  . aspirin EC  81 mg Oral Daily  . dexamethasone  4 mg Intravenous Q12H  . escitalopram  5 mg Oral Daily  . fentaNYL  25 mcg Transdermal Q72H  . gabapentin  300 mg Oral TID  . lidocaine  1 patch Transdermal Q24H  . multivitamin with minerals  1 tablet Oral Daily  . omega-3 acid ethyl esters  2 g Oral Daily  . pantoprazole  20 mg  Oral Daily  . QUEtiapine  25 mg Oral QHS  . simvastatin  20 mg Oral q1800  . sodium chloride flush  3 mL Intravenous Q12H  . sodium chloride flush  3 mL Intravenous Q12H  . tamsulosin  0.4 mg Oral Daily   Continuous Infusions: . sodium chloride 250 mL (08/14/17 0018)  . lactated ringers 10 mL/hr at 08/13/17 1514     LOS: 6 days    Time spent: over 20 min    Fayrene Helper, MD [facility] Pager  [phone]   If 7PM-7AM, please contact night-coverage [email or web address] Password [email or web address] 08/20/2017, 8:56 AM

## 2017-08-21 DIAGNOSIS — C3490 Malignant neoplasm of unspecified part of unspecified bronchus or lung: Secondary | ICD-10-CM

## 2017-08-21 DIAGNOSIS — N390 Urinary tract infection, site not specified: Secondary | ICD-10-CM

## 2017-08-21 DIAGNOSIS — C799 Secondary malignant neoplasm of unspecified site: Secondary | ICD-10-CM

## 2017-08-21 LAB — BASIC METABOLIC PANEL
Anion gap: 10 (ref 5–15)
BUN: 32 mg/dL — AB (ref 6–20)
CO2: 28 mmol/L (ref 22–32)
CREATININE: 1.04 mg/dL (ref 0.61–1.24)
Calcium: 8.9 mg/dL (ref 8.9–10.3)
Chloride: 98 mmol/L — ABNORMAL LOW (ref 101–111)
GFR calc Af Amer: >60 mL/min (ref >60)
GLUCOSE: 176 mg/dL — AB (ref 65–99)
Potassium: 4 mmol/L (ref 3.5–5.1)
SODIUM: 136 mmol/L (ref 135–145)

## 2017-08-21 LAB — CBC
HCT: 27.7 % — ABNORMAL LOW (ref 39.0–52.0)
Hemoglobin: 8.4 g/dL — ABNORMAL LOW (ref 13.0–17.0)
MCH: 25.8 pg — AB (ref 26.0–34.0)
MCHC: 30.3 g/dL (ref 30.0–36.0)
MCV: 85 fL (ref 78.0–100.0)
PLATELETS: 166 10*3/uL (ref 150–400)
RBC: 3.26 MIL/uL — ABNORMAL LOW (ref 4.22–5.81)
RDW: 19.9 % — ABNORMAL HIGH (ref 11.5–15.5)
WBC: 8.8 10*3/uL (ref 4.0–10.5)

## 2017-08-21 MED ORDER — ESCITALOPRAM OXALATE 10 MG PO TABS: 10.0000 mg | ORAL_TABLET | Freq: Every day | ORAL | 0 refills | Status: AC

## 2017-08-21 MED ORDER — OLANZAPINE 5 MG PO TABS: 5.0000 mg | ORAL_TABLET | Freq: Every day | ORAL | 0 refills | Status: AC

## 2017-08-21 MED ORDER — DEXAMETHASONE 4 MG PO TABS: 4.0000 mg | ORAL_TABLET | Freq: Two times a day (BID) | ORAL | 0 refills | Status: AC

## 2017-08-21 MED ORDER — FENTANYL 25 MCG/HR TD PT72: 25.0000 ug | MEDICATED_PATCH | TRANSDERMAL | 0 refills | Status: DC

## 2017-08-21 MED ORDER — MORPHINE SULFATE 15 MG PO TABS: 15.0000 mg | ORAL_TABLET | ORAL | 0 refills | Status: DC | PRN

## 2017-08-21 MED ORDER — FENTANYL 25 MCG/HR TD PT72: 25.0000 ug | MEDICATED_PATCH | TRANSDERMAL | 0 refills | Status: AC

## 2017-08-21 MED ORDER — MORPHINE SULFATE 15 MG PO TABS: 15.0000 mg | ORAL_TABLET | ORAL | 0 refills | Status: AC | PRN

## 2017-08-21 NOTE — Discharge Summary (Addendum)
Physician Discharge Summary  Christian Munoz XBD:[id number] DOB: August 21, 1935 DOA: 08/12/2017  PCP: Christian Chandler, NP  Admit date: 08/12/2017 Discharge date: 08/21/2017  Time spent: over 30 minutes  Recommendations for Outpatient Follow-up:  1. Palliative care should follow up at SNF - palliative recommended continuing with rehab if making functional gains even after radiation until plateau or completes rehab days.  "consider electing hospice benefit at that time". 2. Follow up outpatient CBC/BMP (11/9 was treatment 5 out of 10 planned treatment) - should return to [facility] for completion of treatment course.  Dexamethasone continued PO and should be weaned after completion by radiation oncology. 3. Follow up urine culture (treat only if sx, UA not suggestive of infection) 4. Continue to monitor mood, adjust medications prn 5. Follow up outpatient CBC/BMP 6. CTM pain.  Current regimen with fentanyl patch 25 mcg and MSIR 15 mg q 4 hours prn adjust as needed.  Watch for constipation and add miralax as needed.  Pretreat with rescue med if needed prior to radiation therapy.   Discharge Diagnoses:  Principal Problem:   Leg weakness Active Problems:   Hyperlipidemia   Hypertension   Benign prostatic hyperplasia with urinary obstruction   History of fall   Goals of care, counseling/discussion   Atrial fibrillation, chronic (HCC)   Acute lower UTI   Small cell lung cancer in adult [facility])   Acute back pain less than 4 weeks duration   Fall   Radicular leg pain   Encounter for palliative care   Discharge Condition: stable  Diet recommendation: heart healthy  Filed Weights   08/13/17 1513  Weight: 72.6 kg (160 lb 0 oz)    History of present illness:  82 y.o.malewith medical history significant of Stage 3A small cell lung cancer completed concurrent chemotherapy/radiation in June 2018), atrial fibrillation and pulmonary embolism 1 month ago currently on  Eliquis, balance difficulties over the past 2 weeks with falls,calf pain right leg pain and severe back painwhich hasgotten worse over the past 2 weeks and was relieved by nothing, he was admitted for further workup. Finally MRI after multiple attempts was completed under anesthesia on admission, noted extensive extraosseous epidural tumor extending through the thoraco lumbar spine, severe central canal stenosis at some levels, and foraminal stenosis at others, no evidence of spinal cord compression noted on MRI. Rad Onc consulted,s/pfor XRT/Simulation, Tx to [employer], started XRT Onc Dr.Mohamed notified  Hospital Course:  1. Extensive Thoracolumbar extraosseous epidural metastasis -presenting with back pain/leg pain and leg weakness  -secondary to extensive thoracolumbar extraosseous epidural tumor, no spinal cord compression -Started on IV decadron 4 mg BID, converted to 4 mg PO BID to continue until radiation complete (rad onc to wean) -Patient with known stage IIIa small cell lung cancer status post concurrent chemoradiation in June 2 0 18 subsequently under observation per Dr. Julien Nordmann, now stage IV disease based on above MRI findings -Radiation oncology consult by Dr. Lisbeth Renshaw greatly appreciated,s/pXRT simulation 11/5and treatmentstarted for 10Rx - Pain control with fentanyl patch, Morphine IR 15 mg q 4 hours PRN  -continue lidocaine patch and gabapentin , physical therapy evaluation -Palliative consult followed [ ]  discussed LE weakness with neurosurgery as this seems to be getting worse, they saw him on 11/8, but not interested in any surgical procedure. He has had progressive weakness of his L leg now as well.  Discussed potential MRI brain, but at this point will hold off.  Can consider after completion of radiation therapy if  sx worsens.    2. Acute lower urinary tract infection: Recently completed multiple antibiotic courses, urine culture from 10/10 with enterococcus  sensitive to ampicillin and vancomycin  -Cultures with enterococcus again -Completed 5 day course of antibiotics Denies dysuria, but notes persistent freq.  Repeat UA not suggestive of UTI, follow up culture.    3. Falls in the past 2 weeks: -Due to problem #1  -PT  4. Chronic atrial fibrillation: -resumed Eliquis - in sinus rhythm, rate controlled at this time   7.stage IV small cell lung cancer  - previously was stage IIIa status post chemoradiation in June 2 018  - now metastatic with above MRI findings  - radiation oncology consult,called anddiscussed with Dr. Heron Sabins - palliative consulted   7. Hyperlipidemia:Continue home medications.  8. Hypertension:Continue home regimen.  9. Benign prostatic hyperplasia with urinary obstruction: -stable, continue flomax  Leukocytosis: resolved  10. Hemorrhoids:  Hx of hemorrhoids and noted blood in depends Christian Munoz few days ago he thinks was due to this.  H/H stable.  Resolved at this point.  Discussed rectal steroid which he declines.  Will ctm.   Depression:  Had some passive SI on 11/9, but in much better spirits today.  Did not ever express any active SI.  Meds adjusted.  Zyprexa added, lexapro increased.  Continue to monitor mood.  11. Goals of care counseling and discussion: -DNR, palliative following     Procedures:  Radiation therapy (i.e. Studies not automatically included, echos, thoracentesis, etc; not x-rays)  Consultations:  Oncology, palliative, radiation oncology  Discharge Exam: Vitals:   08/20/17 2009 08/21/17 0444  BP: 132/71 137/82  Pulse: 84 80  Resp: 18 18  Temp: 98 F (36.7 C) 97.6 F (36.4 C)  SpO2: 96% 98%   Feeling in better spirits today.  Some LLE weakness that's worse than before.  Otherwise, things seem about the same.  General: No acute distress. Cardiovascular: Heart sounds show Christian Munoz regular rate, and rhythm. No gallops or rubs. No murmurs. No JVD. Lungs: Clear to  auscultation bilaterally with good air movement. No rales, rhonchi or wheezes. Abdomen: Soft, nontender, nondistended with normal active bowel sounds. No masses. No hepatosplenomegaly. Neurological: Alert and oriented 3. Cranial nerves II through XII grossly intact.  Antigravity to LLE.  Unable to lift RLE against gravity. Skin: Warm and dry. No rashes or lesions. Extremities: No clubbing or cyanosis. No edema. Pedal pulses 2+. Psychiatric: Mood and affect are normal. Insight and judgment are appropriate.  Discharge Instructions   Discharge Instructions    Call MD for:  difficulty breathing, headache or visual disturbances   Complete by:  As directed    Call MD for:  extreme fatigue   Complete by:  As directed    Call MD for:  persistant dizziness or light-headedness   Complete by:  As directed    Call MD for:  persistant nausea and vomiting   Complete by:  As directed    Call MD for:  redness, tenderness, or signs of infection (pain, swelling, redness, odor or green/yellow discharge around incision site)   Complete by:  As directed    Call MD for:  severe uncontrolled pain   Complete by:  As directed    Call MD for:  temperature >100.4   Complete by:  As directed    Diet - low sodium heart healthy   Complete by:  As directed    Discharge instructions   Complete by:  As directed    You  were seen for back pain and lower extremity weakness.  You cancer has spread to your spine.  You started steroids and radiation treatment.  The radiation treatment will be continued for Jalynne Persico total of 10 treatments.  Your steroid will eventually be tapered by the radiation oncologist.  We made some adjustments with your medications.  You've been started on zyprexa and your lexapro was increased.  We have you on Rawley Harju fentanyl patch for pain with morphine as needed.  Palliative care should follow you at the SNF.   Increase activity slowly   Complete by:  As directed      Current Discharge Medication List     START taking these medications   Details  dexamethasone (DECADRON) 4 MG tablet Take 1 tablet (4 mg total) 2 (two) times daily by mouth. To be tapered/discontinued by radiation oncology Qty: 60 tablet, Refills: 0    fentaNYL (DURAGESIC - DOSED MCG/HR) 25 MCG/HR patch Place 1 patch (25 mcg total) every 3 (three) days onto the skin. Qty: 5 patch, Refills: 0    morphine (MSIR) 15 MG tablet Take 1 tablet (15 mg total) every 4 (four) hours as needed for up to 3 days by mouth for severe pain. Qty: 18 tablet, Refills: 0    OLANZapine (ZYPREXA) 5 MG tablet Take 1 tablet (5 mg total) at bedtime by mouth. Qty: 30 tablet, Refills: 0      CONTINUE these medications which have CHANGED   Details  escitalopram (LEXAPRO) 10 MG tablet Take 1 tablet (10 mg total) daily by mouth. Qty: 30 tablet, Refills: 0      CONTINUE these medications which have NOT CHANGED   Details  apixaban (ELIQUIS) 5 MG TABS tablet Take 5 mg by mouth 2 (two) times daily.    aspirin 81 MG tablet Take 81 mg by mouth daily. Take one tablet once Jamille Yoshino day    gabapentin (NEURONTIN) 300 MG capsule Take 1 capsule (300 mg total) by mouth 3 (three) times daily. Qty: 90 capsule, Refills: 3    Glucosamine-Chondroitin (GLUCOSAMINE CHONDR COMPLEX PO) Take 1 tablet by mouth daily.    Glycerin-Polysorbate 80 (REFRESH DRY EYE THERAPY OP) Place 1 drop into both eyes as needed (dry eye).    Multiple Vitamins-Minerals (EYE VITAMINS) CAPS Take 1 capsule by mouth daily.    Multiple Vitamins-Minerals (MULTIVITAMIN WITH MINERALS) tablet Take 1 tablet by mouth daily.    pantoprazole (PROTONIX) 20 MG tablet TAKE 1 TABLET(20 MG) BY MOUTH DAILY Qty: 90 tablet, Refills: 1    tamsulosin (FLOMAX) 0.4 MG CAPS capsule TAKE 1 CAPSULE(0.4 MG) BY MOUTH DAILY Qty: 90 capsule, Refills: 1      STOP taking these medications     fish oil-omega-3 fatty acids 1000 MG capsule      oxyCODONE-acetaminophen (PERCOCET/ROXICET) 5-325 MG tablet      QUEtiapine  (SEROQUEL) 25 MG tablet      simvastatin (ZOCOR) 20 MG tablet      carvedilol (COREG) 3.125 MG tablet        Allergies  Allergen Reactions  . Chantix [Varenicline] Other (See Comments)    Makes patient suicidal  . Zoloft [Sertraline Hcl] Other (See Comments)    Makes patient suicidal   Contact information for after-discharge care    Destination    [facility] .   Service:  Skilled Nursing Contact information: [address] [place] [phone]  The results of significant diagnostics from this hospitalization (including imaging, microbiology, ancillary and laboratory) are listed below for reference.    Significant Diagnostic Studies: Dg Chest 2 View  Result Date: 08/12/2017 CLINICAL DATA:  Right-sided pain after fall from bed. History of pneumonia. Former smoker. EXAM: CHEST  2 VIEW COMPARISON:  07/13/2017 chest CT FINDINGS: Top-normal size heart with aortic atherosclerosis. Port catheter is noted with tip in the distal SVC. Mild hyperinflation of the upper lobes with crowding of interstitial lung markings in the lower lobes. Streaky parenchymal opacities at the left base some which likely reflect chronic post radiation change and/or atelectasis. Superimposed pneumonia would be difficult to entirely exclude but based on history of fall, believed less likely. No acute nor suspicious osseous abnormalities. No pneumothorax. IMPRESSION: 1. Aortic atherosclerosis. 2. Left basilar atelectasis, scarring and/or post radiated change. Superimposed subtle pneumonia is not entirely excluded but based on clinical history, believed less likely. 3. No acute fracture or pneumothorax. Electronically Signed   By: Ashley Royalty M.D.   On: 08/12/2017 23:41   Dg Lumbar Spine Complete  Result Date: 08/12/2017 CLINICAL DATA:  Pain after fall EXAM: LUMBAR SPINE - COMPLETE 4+ VIEW COMPARISON:  CXR 10/23/2016 FINDINGS: Lumbosacral  transitional vertebral body. S1-S2 disc noted based on 12 ribbed thoracic vertebral bodies on the chest radiograph. The lowest square vertebral body will be labeled S1. No acute compression fracture. There is moderate disc space narrowing L4-5, L5-S1 and S1-S2 with associated degenerative facet arthropathy. Minimal grade 1 anterolisthesis of L5 on S1. Aortoiliac atherosclerosis noted without definite aneurysm. IMPRESSION: 1. Transitional lumbosacral vertebral body. 2. Lower lumbar degenerative disc and facet arthropathy L4 through S2. 3. No acute osseous abnormality. 4. Minimal grade 1 anterolisthesis of L5 on S1. Electronically Signed   By: Ashley Royalty M.D.   On: 08/12/2017 23:38   Ct Head Wo Contrast  Result Date: 08/12/2017 CLINICAL DATA:  Fall while on anti coagulation therapy. EXAM: CT HEAD WITHOUT CONTRAST TECHNIQUE: Contiguous axial images were obtained from the base of the skull through the vertex without intravenous contrast. COMPARISON:  Brain MRI 06/04/2017 FINDINGS: Brain: No mass lesion, intraparenchymal hemorrhage or extra-axial collection. No evidence of acute cortical infarct. There is periventricular hypoattenuation compatible with chronic microvascular disease. Mild atrophy Vascular: No hyperdense vessel or unexpected calcification. Skull: Normal visualized skull base, calvarium and extracranial soft tissues. Sinuses/Orbits: No sinus fluid levels or advanced mucosal thickening. No mastoid effusion. Normal orbits. IMPRESSION: Mild atrophy and chronic microvascular disease without acute abnormality. Electronically Signed   By: Ulyses Jarred M.D.   On: 08/12/2017 23:29   Mr Thoracic Spine W Wo Contrast  Result Date: 08/13/2017 CLINICAL DATA:  Small cell lung cancer. Increased falls over the last 2 weeks. Right lower extremity pain and severe low back pain. Progressive numbness and weakness in the right lower extremity. Unable to walk. EXAM: MRI THORACIC AND LUMBAR SPINE WITHOUT AND WITH  CONTRAST TECHNIQUE: Multiplanar and multiecho pulse sequences of the thoracic and lumbar spine were obtained without and with intravenous contrast. CONTRAST:  44mL MULTIHANCE GADOBENATE DIMEGLUMINE 529 MG/ML IV SOLN COMPARISON:  Lumbar spine radiographs 08/12/2017. PET scan 12/03/2016. FINDINGS: MRI THORACIC SPINE FINDINGS Alignment: AP alignment is anatomic. Rightward curvature of the thoracic spine is centered at T7-8. Vertebrae: Diffuse osseous metastatic disease is present. There is involvement of posterior elements on the right at T1 and bilaterally at T2. There is diffuse involvement of vertebral bodies throughout the remainder of the thoracic spine. Cord:  Normal signal is  present throughout the thoracic spinal cord Paraspinal and other soft tissues: The left infrahilar mass is increased. There is further collapse or metastatic disease into the left lung. Dependent right low lower lobe atelectasis is present. Bilateral effusions are noted, right greater than left. Numerous cystic lesions are again noted in the liver. Disc levels: T1-2: Facet hypertrophy contributes to foraminal narrowing, right greater than left. T2-3: Mild facet hypertrophy is present on the left without significant stenosis. T3-4:  No significant disc disease or stenosis. T4-5: Mild facet hypertrophy is present bilaterally without focal stenosis. T5-6: Facet hypertrophy is present bilaterally without focal stenosis. T6-7: Facet hypertrophy is present. Extraosseous tumor is present on the right without foraminal invasion. T7-8: Extraosseous tumor extends into the right neural foramen with moderate right foraminal stenosis. The left foramen is patent. T8-9: Extraosseous tumor is noted beyond the left foramen. Facet hypertrophy contributes to mild foraminal narrowing, left greater than right. T9-10: Disc disease and facet hypertrophy contributes to mild foraminal narrowing bilaterally. T10-11: Disc disease and facet hypertrophy contributes to  mild right foraminal narrowing. Kaceton Vieau perineural root sleeve cyst is present on the left. T11-12: Metastatic disease involves the right rib proximally without focal stenosis. Facet spurring contributes to mild left foraminal narrowing. Tennis Mckinnon perineural root sleeve cyst is present on the right. T12-L1: Schmorl's nodes are present without significant stenosis. MRI LUMBAR SPINE FINDINGS Segmentation:  Transitional anatomy is present at S1. Alignment: Degenerative anterolisthesis is present at S1-2 and L5-S1. There is slight degenerative retrolisthesis at L4-5. Vertebrae: Extensive osseous metastatic disease is present throughout the lumbar spine and sacrum. Conus medullaris: Extends to the L1 level and appears normal. Paraspinal and other soft tissues: Extraosseous tumor is noted on the right at S1. This likely impacts the right S1 foramen anteriorly. There is distention of the urinary bladder with wall thickening. This may be neurogenic. Disc levels: Diffuse extraosseous tumor is present in the posterior epidural space from L2-3 through L3-4. The enhancing soft tissue mass measures 4.6 x 0.8 cm on the sagittal images. The tumor is 1.3 cm wide. This results in severe central canal stenosis at L3. The canal is narrowed to 3 mm in the midline. L1-2: Facet hypertrophy contributes to mild foraminal narrowing bilaterally, worse on the right. L2-3: Ronalda Walpole broad-based disc protrusion is present. Mild facet hypertrophy is noted. There is no significant stenosis at this level. L3-4: In addition to tumor, Jerolyn Flenniken broad-based disc protrusion and facet hypertrophy contribute to moderate central canal stenosis. Moderate foraminal narrowing is worse on the left. L4-5: Extraosseous tumor extends into the right neural foramen resulting in severe right foraminal stenosis. Moderate left foraminal narrowing is due to disc disease and facet hypertrophy. Tumor also contributes to moderate right subarticular stenosis. L5-S1: Chidubem Chaires broad-based disc protrusion is  present. Advanced facet hypertrophy is noted. This results an moderate subarticular stenosis bilaterally. Moderate left and mild right foraminal narrowing is present. Extraosseous tumor extends from the posterior elements into the right posterolateral aspect of the canal the superior right foramen. S1-2: Anterolisthesis results in uncovering of the disc with right greater than left subarticular and foraminal stenosis. IMPRESSION: 1. 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 The posterior tumor at L2-3 measures 4.6 x 0.8 x 1.3 cm. 2. Extraosseous tumor extends into the right foramen at L4-5 with severe right foraminal stenosis. 3. Extraosseous tumor and degenerative change contribute to moderate right subarticular and mild right foraminal stenosis at L5-S1. 4. Typical degenerative changes are also present in the  lumbar spine resulting in moderate foraminal narrowing bilaterally at L3-4, left greater than right. 5. Degenerate changes contribute to moderate left foraminal narrowing at L4-5. 6. Degenerative changes contribute to moderate left subarticular and foraminal narrowing at L5-S1. 7. Degenerative subarticular and foraminal narrowing at S1-2 is worse on the right. Prominent extraosseous tumor from the right sacral ala extends into the ventral soft tissues and likely impacts the ventral S1 foramen. 8. Diffuse osseous metastatic disease throughout the thoracic and lumbar spine. 9. Extraosseous tumor extends into the right neural foramen at T7-8 with moderate right foraminal stenosis. 10. Extraosseous tumor is noted just beyond the left foramen at T8-9. This could impact the left T8 nerve root. 11. No other focal stenosis of the thoracic spine due to tumor. 12. Multilevel degenerative change and foraminal narrowing in the thoracic spine due to the endplate change and facet disease. Critical Value/emergent results were called by telephone at the time of interpretation on  08/13/2017 at 6:34 pm to Dr. Randa Spike , who verbally acknowledged these results. Electronically Signed   By: San Morelle M.D.   On: 08/13/2017 18:55   Mr Lumbar Spine W Wo Contrast  Result Date: 08/13/2017 CLINICAL DATA:  Small cell lung cancer. Increased falls over the last 2 weeks. Right lower extremity pain and severe low back pain. Progressive numbness and weakness in the right lower extremity. Unable to walk. EXAM: MRI THORACIC AND LUMBAR SPINE WITHOUT AND WITH CONTRAST TECHNIQUE: Multiplanar and multiecho pulse sequences of the thoracic and lumbar spine were obtained without and with intravenous contrast. CONTRAST:  78mL MULTIHANCE GADOBENATE DIMEGLUMINE 529 MG/ML IV SOLN COMPARISON:  Lumbar spine radiographs 08/12/2017. PET scan 12/03/2016. FINDINGS: MRI THORACIC SPINE FINDINGS Alignment: AP alignment is anatomic. Rightward curvature of the thoracic spine is centered at T7-8. Vertebrae: Diffuse osseous metastatic disease is present. There is involvement of posterior elements on the right at T1 and bilaterally at T2. There is diffuse involvement of vertebral bodies throughout the remainder of the thoracic spine. Cord:  Normal signal is present throughout the thoracic spinal cord Paraspinal and other soft tissues: The left infrahilar mass is increased. There is further collapse or metastatic disease into the left lung. Dependent right low lower lobe atelectasis is present. Bilateral effusions are noted, right greater than left. Numerous cystic lesions are again noted in the liver. Disc levels: T1-2: Facet hypertrophy contributes to foraminal narrowing, right greater than left. T2-3: Mild facet hypertrophy is present on the left without significant stenosis. T3-4:  No significant disc disease or stenosis. T4-5: Mild facet hypertrophy is present bilaterally without focal stenosis. T5-6: Facet hypertrophy is present bilaterally without focal stenosis. T6-7: Facet hypertrophy is present.  Extraosseous tumor is present on the right without foraminal invasion. T7-8: Extraosseous tumor extends into the right neural foramen with moderate right foraminal stenosis. The left foramen is patent. T8-9: Extraosseous tumor is noted beyond the left foramen. Facet hypertrophy contributes to mild foraminal narrowing, left greater than right. T9-10: Disc disease and facet hypertrophy contributes to mild foraminal narrowing bilaterally. T10-11: Disc disease and facet hypertrophy contributes to mild right foraminal narrowing. Nyleah Mcginnis perineural root sleeve cyst is present on the left. T11-12: Metastatic disease involves the right rib proximally without focal stenosis. Facet spurring contributes to mild left foraminal narrowing. Brooklyne Radke perineural root sleeve cyst is present on the right. T12-L1: Schmorl's nodes are present without significant stenosis. MRI LUMBAR SPINE FINDINGS Segmentation:  Transitional anatomy is present at S1. Alignment: Degenerative anterolisthesis is present at S1-2 and L5-S1.  There is slight degenerative retrolisthesis at L4-5. Vertebrae: Extensive osseous metastatic disease is present throughout the lumbar spine and sacrum. Conus medullaris: Extends to the L1 level and appears normal. Paraspinal and other soft tissues: Extraosseous tumor is noted on the right at S1. This likely impacts the right S1 foramen anteriorly. There is distention of the urinary bladder with wall thickening. This may be neurogenic. Disc levels: Diffuse extraosseous tumor is present in the posterior epidural space from L2-3 through L3-4. The enhancing soft tissue mass measures 4.6 x 0.8 cm on the sagittal images. The tumor is 1.3 cm wide. This results in severe central canal stenosis at L3. The canal is narrowed to 3 mm in the midline. L1-2: Facet hypertrophy contributes to mild foraminal narrowing bilaterally, worse on the right. L2-3: Rudolpho Claxton broad-based disc protrusion is present. Mild facet hypertrophy is noted. There is no  significant stenosis at this level. L3-4: In addition to tumor, Sinaya Minogue broad-based disc protrusion and facet hypertrophy contribute to moderate central canal stenosis. Moderate foraminal narrowing is worse on the left. L4-5: Extraosseous tumor extends into the right neural foramen resulting in severe right foraminal stenosis. Moderate left foraminal narrowing is due to disc disease and facet hypertrophy. Tumor also contributes to moderate right subarticular stenosis. L5-S1: Gera Inboden broad-based disc protrusion is present. Advanced facet hypertrophy is noted. This results an moderate subarticular stenosis bilaterally. Moderate left and mild right foraminal narrowing is present. Extraosseous tumor extends from the posterior elements into the right posterolateral aspect of the canal the superior right foramen. S1-2: Anterolisthesis results in uncovering of the disc with right greater than left subarticular and foraminal stenosis. IMPRESSION: 1. 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 The posterior tumor at L2-3 measures 4.6 x 0.8 x 1.3 cm. 2. Extraosseous tumor extends into the right foramen at L4-5 with severe right foraminal stenosis. 3. Extraosseous tumor and degenerative change contribute to moderate right subarticular and mild right foraminal stenosis at L5-S1. 4. Typical degenerative changes are also present in the lumbar spine resulting in moderate foraminal narrowing bilaterally at L3-4, left greater than right. 5. Degenerate changes contribute to moderate left foraminal narrowing at L4-5. 6. Degenerative changes contribute to moderate left subarticular and foraminal narrowing at L5-S1. 7. Degenerative subarticular and foraminal narrowing at S1-2 is worse on the right. Prominent extraosseous tumor from the right sacral ala extends into the ventral soft tissues and likely impacts the ventral S1 foramen. 8. Diffuse osseous metastatic disease throughout the thoracic and lumbar  spine. 9. Extraosseous tumor extends into the right neural foramen at T7-8 with moderate right foraminal stenosis. 10. Extraosseous tumor is noted just beyond the left foramen at T8-9. This could impact the left T8 nerve root. 11. No other focal stenosis of the thoracic spine due to tumor. 12. Multilevel degenerative change and foraminal narrowing in the thoracic spine due to the endplate change and facet disease. Critical Value/emergent results were called by telephone at the time of interpretation on 08/13/2017 at 6:34 pm to Dr. Randa Spike , who verbally acknowledged these results. Electronically Signed   By: San Morelle M.D.   On: 08/13/2017 18:55   Dg Foot Complete Right  Result Date: 08/12/2017 CLINICAL DATA:  Bruising of the right toes after fall today. EXAM: RIGHT FOOT COMPLETE - 3+ VIEW COMPARISON:  None. FINDINGS: Hallux rigidus of the great toe with marked joint space narrowing and spurring of the first MTP. No acute fracture or malalignment is noted. There is dorsal soft  tissue swelling of the forefoot. There is Willim Turnage tiny plantar calcaneal enthesophyte. IMPRESSION: 1. Hallux rigidus of the first MTP with marked joint space narrowing and spurring. 2. No acute osseous abnormality. 3. Dorsal soft tissue swelling over the forefoot. 4. Small plantar calcaneal enthesophyte. Electronically Signed   By: Ashley Royalty M.D.   On: 08/12/2017 23:32   Dg Hip Unilat With Pelvis 1v Left  Result Date: 08/12/2017 CLINICAL DATA:  Loss of balance and fell off bed.  Left hip pain. EXAM: DG HIP (WITH OR WITHOUT PELVIS) 1V*L* COMPARISON:  None. FINDINGS: There is no evidence of hip fracture or dislocation. There is degenerative disc space narrowing at L4-5. L5-S1 degenerative facet hypertrophy and sclerosis. No suspicious osseous lesions of the bony pelvis and hips. Intact sacroiliac joints and pubic symphysis. IMPRESSION: No acute fracture or malalignment of either hip. There is lower lumbar facet arthropathy  and disc disease. Electronically Signed   By: Ashley Royalty M.D.   On: 08/12/2017 23:30    Microbiology: Recent Results (from the past 240 hour(s))  Urine culture     Status: Abnormal   Collection Time: 08/13/17  2:15 AM  Result Value Ref Range Status   Specimen Description URINE, CLEAN CATCH  Final   Special Requests Normal  Final   Culture >=100,000 COLONIES/mL ENTEROCOCCUS FAECALIS (Glenis Musolf)  Final   Report Status 08/15/2017 FINAL  Final   Organism ID, Bacteria ENTEROCOCCUS FAECALIS (Josselyne Onofrio)  Final      Susceptibility   Enterococcus faecalis - MIC*    AMPICILLIN <=2 SENSITIVE Sensitive     LEVOFLOXACIN 1 SENSITIVE Sensitive     NITROFURANTOIN 32 SENSITIVE Sensitive     VANCOMYCIN 2 SENSITIVE Sensitive     * >=100,000 COLONIES/mL ENTEROCOCCUS FAECALIS     Labs: Basic Metabolic Panel: Recent Labs  Lab 08/16/17 0613 08/19/17 0414 08/20/17 0410 08/21/17 0419  NA 139 138 134* 136  K 3.7 4.3 4.1 4.0  CL 101 100* 94* 98*  CO2 27 28 27 28   GLUCOSE 136* 127* 134* 176*  BUN 24* 32* 28* 32*  CREATININE 0.99 1.06 0.90 1.04  CALCIUM 8.9 9.2 9.1 8.9   Liver Function Tests: No results for input(s): AST, ALT, ALKPHOS, BILITOT, PROT, ALBUMIN in the last 168 hours. No results for input(s): LIPASE, AMYLASE in the last 168 hours. No results for input(s): AMMONIA in the last 168 hours. CBC: Recent Labs  Lab 08/16/17 0613 08/19/17 0414 08/20/17 0410 08/21/17 0419  WBC 8.3 10.4 10.8* 8.8  HGB 8.6* 8.7* 9.3* 8.4*  HCT 28.8* 28.7* 30.2* 27.7*  MCV 85.2 84.4 84.1 85.0  PLT 139* 149* 157 166   Cardiac Enzymes: No results for input(s): CKTOTAL, CKMB, CKMBINDEX, TROPONINI in the last 168 hours. BNP: BNP (last 3 results) Recent Labs    10/23/16 1523 07/13/17 1130  BNP 133.4* 158.1*    ProBNP (last 3 results) No results for input(s): PROBNP in the last 8760 hours.  CBG: No results for input(s): GLUCAP in the last 168 hours.     Signed:  Fayrene Helper MD.  [facility] 08/21/2017, 1:51 PM

## 2017-08-21 NOTE — Progress Notes (Signed)
Discussed with patient discharge instructions, he verbalized agreement and understanding.  Patient to go to [facility] by ambulance. Report called to [place].  Patient living with all belongings.

## 2017-08-21 NOTE — Progress Notes (Signed)
Daily Progress Note   Patient Name: Christian Munoz       Date: 08/21/2017 DOB: 1935/10/02  Age: 82 y.o. MRN#: [id number] Attending Physician: Elodia Florence., * Primary Care Physician: [name], NP Admit Date: 08/12/2017  Reason for Consultation/Follow-up: Establishing goals of care  Subjective: I met with Christian Munoz, his son, and his daughter in conjunction with Dr. Florene Glen.  We reviewed care plan including continued weakness and hope that this is something that will improve with radiation.  Discussed potential for further imaging, and he is in agreement with plan to defer further imaging at this time.    We discussed his pain regimen as well as plan for follow-up by palliative at SNF and possible enrollment in hospice in the near future.   Still having regular BM.  See below:   Length of Stay: 7  Current Medications: Scheduled Meds:  . apixaban  5 mg Oral BID  . aspirin EC  81 mg Oral Daily  . dexamethasone  4 mg Oral Q12H  . escitalopram  10 mg Oral Daily  . fentaNYL  25 mcg Transdermal Q72H  . gabapentin  300 mg Oral TID  . lidocaine  1 patch Transdermal Q24H  . multivitamin with minerals  1 tablet Oral Daily  . OLANZapine  5 mg Oral QHS  . omega-3 acid ethyl esters  2 g Oral Daily  . pantoprazole  20 mg Oral Daily  . simvastatin  20 mg Oral q1800  . sodium chloride flush  3 mL Intravenous Q12H  . sodium chloride flush  3 mL Intravenous Q12H  . tamsulosin  0.4 mg Oral Daily    Continuous Infusions: . sodium chloride 250 mL (08/14/17 0018)  . lactated ringers 10 mL/hr at 08/13/17 1514    PRN Meds: sodium chloride, acetaminophen **OR** acetaminophen, HYDROmorphone (DILAUDID) injection, morphine, ondansetron **OR** ondansetron (ZOFRAN) IV,  sodium chloride flush PPS 40%  Physical Exam         Awake alert Still with ongoing weakness in RLE Clear Abdomen soft S1 S2 Pain in back Mood congruent  Vital Signs: BP 137/82 (BP Location: Right Arm)   Pulse 80   Temp 97.6 F (36.4 C) (Oral)   Resp 18   Ht _0  (1.778 m)   Wt 72.6 kg (160 lb 0 oz)  SpO2 98%   BMI 22.96 kg/m  SpO2: SpO2: 98 % O2 Device: O2 Device: Not Delivered O2 Flow Rate: O2 Flow Rate (L/min): 2 L/min  Intake/output summary:   Intake/Output Summary (Last 24 hours) at 08/21/2017 1346 Last data filed at 08/21/2017 0444 Gross per 24 hour  Intake 240 ml  Output 400 ml  Net -160 ml   LBM: Last BM Date: 08/21/17 Baseline Weight: Weight: 72.6 kg (160 lb 0 oz) Most recent weight: Weight: 72.6 kg (160 lb 0 oz)       Palliative Assessment/Data:    Flowsheet Rows     Most Recent Value  Intake Tab  Referral Department  Hospitalist  Unit at Time of Referral  Cardiac/Telemetry Unit  Palliative Care Primary Diagnosis  Cancer  Palliative Care Type  New Palliative care  Reason for referral  Pain, Clarify Goals of Care, Counsel Regarding Hospice  Date first seen by Palliative Care  08/14/17  Clinical Assessment  Palliative Performance Scale Score  30%  Pain Max last 24 hours  6  Pain Min Last 24 hours  5  Dyspnea Max Last 24 Hours  4  Dyspnea Min Last 24 hours  3  Nausea Max Last 24 Hours  2  Nausea Min Last 24 Hours  1  Anxiety Max Last 24 Hours  5  Anxiety Min Last 24 Hours  4  Psychosocial & Spiritual Assessment  Palliative Care Outcomes  Patient/Family meeting held?  Yes  Who was at the meeting?  patient and son.   Palliative Care Outcomes  Clarified goals of care, Improved pain interventions      Patient Active Problem List   Diagnosis Date Noted  . Spine metastasis ([place]) 08/16/2017  . Fall   . Radicular leg pain   . Encounter for palliative care   . Leg weakness 08/13/2017  . Acute back pain less than 4 weeks duration  08/13/2017  . Acute lower UTI   . Small cell lung cancer in adult [facility])   . Anemia   . Hypoxemia   . Pulmonary embolism ([place]) 07/13/2017  . Atrial fibrillation, chronic ([place]) 07/13/2017  . Lower urinary tract infectious disease 07/13/2017  . Chronic pain 07/13/2017  . Antineoplastic chemotherapy induced anemia 03/01/2017  . Cancer associated pain 02/08/2017  . Dysuria 01/18/2017  . Fever 01/13/2017  . Port catheter in place 01/12/2017  . Small cell lung cancer, left ([place]) 11/30/2016  . Goals of care, counseling/discussion 11/30/2016  . Encounter for antineoplastic chemotherapy 11/30/2016  . History of fall 02/28/2016  . Hypertension 12/04/2014  . Benign prostatic hyperplasia with urinary obstruction 12/04/2014  . Depression 12/04/2014  . Hyperlipidemia 01/06/2013  . Reflux esophagitis   . Osteoarthrosis, unspecified whether generalized or localized, unspecified site     Palliative Care Assessment & Plan   Patient Profile:   Assessment:  small cell lung cancer Extensive thoraco lumbar osseous metastatic burden Uncontrolled pain Weakness in RLE UTI Has chronic A fib BPH History of falls and weakness lately.   Recommendations/Plan: -Continue radiation.  We discussed pretreating with rescue med prior to going for treatment if pain continues to be an issue -Pain well controlled with current rescue MSIR this AM.  Continue with current pain and adjuvant medication regimen.  -Constipation currently not an issue.  Miralax as needed if constipation becomes an issue. -Plan for trial of rehab to see how much functional status he can gain while continuing his course of radiation therapy.  If he is making functional gains,  I recommended that he continue with rehab even after he finishes radiation until he hits a plateau or completes his rehab days.  He may then consider electing his hospice benefit at that time.  I recommended palliative to follow him at SNF to assist in decision making  moving forward. -Depression: Currently on Lexapro 10 mg daily and Zyprexa 5 mg nightly.    Code Status:    Code Status Orders  (From admission, onward)        Start     Ordered   08/13/17 1216  Do not attempt resuscitation (DNR)  Continuous    Question Answer Comment  In the event of cardiac or respiratory ARREST Do not call a "code blue"   In the event of cardiac or respiratory ARREST Do not perform Intubation, CPR, defibrillation or ACLS   In the event of cardiac or respiratory ARREST Use medication by any route, position, wound care, and other measures to relive pain and suffering. May use oxygen, suction and manual treatment of airway obstruction as needed for comfort.      08/13/17 1220    Code Status History    Date Active Date Inactive Code Status Order ID Comments User Context   07/13/2017 11:30 07/15/2017 18:13 Full Code [id number]  Waldemar Dickens, MD ED   01/14/2017 13:41 07/13/2017 03:44 DNR [id number]  [name], NP Outpatient   04/04/2015 14:44 10/23/2016 15:07 DNR [id number]  [name], NP Outpatient    Advance Directive Documentation     Most Recent Value  Type of Advance Directive  Healthcare Power of [place], Living will, Out of facility DNR (pink MOST or yellow form)  Pre-existing out of facility DNR order (yellow form or pink MOST form)  Yellow form placed in chart (order not valid for inpatient use)  "MOST" Form in Place?  No data       Prognosis:  < 6 months  Discharge Planning:  [place] for rehab with Palliative care service follow-up  Care plan was discussed with  Patient.  Thank you for allowing the Palliative Medicine Team to assist in the care of this patient.   Total Time 30 Prolonged Time Billed  no       Greater than 50%  of this time was spent counseling and coordinating care related to the above assessment and plan.  Micheline Rough, MD [facility] [phone]   Please  contact Palliative Medicine Team phone at [phone] for questions and concerns.

## 2017-08-22 LAB — URINE CULTURE: CULTURE: NO GROWTH

## 2017-08-23 ENCOUNTER — Encounter: Payer: Self-pay | Admitting: Adult Health

## 2017-08-23 ENCOUNTER — Non-Acute Institutional Stay (SKILLED_NURSING_FACILITY): Payer: Medicare Other | Admitting: Adult Health

## 2017-08-23 ENCOUNTER — Telehealth: Payer: Self-pay

## 2017-08-23 ENCOUNTER — Telehealth: Payer: Self-pay | Admitting: *Deleted

## 2017-08-23 ENCOUNTER — Ambulatory Visit
Admission: RE | Admit: 2017-08-23 | Discharge: 2017-08-23 | Disposition: A | Discharge status: Home / Self Care | Payer: Medicare Other | Source: Ambulatory Visit | Attending: Radiation Oncology | Admitting: Radiation Oncology

## 2017-08-23 DIAGNOSIS — Z510 Encounter for antineoplastic radiation therapy: Secondary | ICD-10-CM | POA: Diagnosis present

## 2017-08-23 DIAGNOSIS — F329 Major depressive disorder, single episode, unspecified: Secondary | ICD-10-CM | POA: Diagnosis not present

## 2017-08-23 DIAGNOSIS — I482 Chronic atrial fibrillation, unspecified: Secondary | ICD-10-CM

## 2017-08-23 DIAGNOSIS — D492 Neoplasm of unspecified behavior of bone, soft tissue, and skin: Secondary | ICD-10-CM | POA: Diagnosis not present

## 2017-08-23 DIAGNOSIS — G893 Neoplasm related pain (acute) (chronic): Secondary | ICD-10-CM

## 2017-08-23 DIAGNOSIS — F32A Depression, unspecified: Secondary | ICD-10-CM

## 2017-08-23 DIAGNOSIS — C3492 Malignant neoplasm of unspecified part of left bronchus or lung: Secondary | ICD-10-CM

## 2017-08-23 DIAGNOSIS — N390 Urinary tract infection, site not specified: Secondary | ICD-10-CM

## 2017-08-23 DIAGNOSIS — N401 Enlarged prostate with lower urinary tract symptoms: Secondary | ICD-10-CM | POA: Diagnosis not present

## 2017-08-23 DIAGNOSIS — N138 Other obstructive and reflux uropathy: Secondary | ICD-10-CM | POA: Diagnosis not present

## 2017-08-23 DIAGNOSIS — C7951 Secondary malignant neoplasm of bone: Secondary | ICD-10-CM | POA: Diagnosis not present

## 2017-08-23 DIAGNOSIS — K2100 Gastro-esophageal reflux disease with esophagitis, without bleeding: Secondary | ICD-10-CM

## 2017-08-23 DIAGNOSIS — C801 Malignant (primary) neoplasm, unspecified: Secondary | ICD-10-CM | POA: Diagnosis not present

## 2017-08-23 NOTE — Progress Notes (Signed)
DATE:  08/23/2017   MRN:  [id number]  BIRTHDAY: [birth year]  Facility:  Nursing Home Location:  [facility] Room Number: 215-A  LEVEL OF CARE:  SNF (31)  Contact Information    Name Relation Home Work Mobile   [place] Son [phone]         Code Status History    Date Active Date Inactive Code Status Order ID Comments User Context   08/13/2017 12:20 08/21/2017 19:08 DNR [id number]  Lady Deutscher, MD ED   07/13/2017 11:30 07/15/2017 18:13 Full Code [id number]  Waldemar Dickens, MD ED   01/14/2017 13:41 07/13/2017 03:44 DNR [id number]  [name], NP Outpatient   04/04/2015 14:44 10/23/2016 15:07 DNR [id number]  [name], NP Outpatient    Questions for Most Recent Historical Code Status (Order [id number])    Question Answer Comment   In the event of cardiac or respiratory ARREST Do not call a "code blue"    In the event of cardiac or respiratory ARREST Do not perform Intubation, CPR, defibrillation or ACLS    In the event of cardiac or respiratory ARREST Use medication by any route, position, wound care, and other measures to relive pain and suffering. May use oxygen, suction and manual treatment of airway obstruction as needed for comfort.        Chief Complaint  Patient presents with  . Acute Visit    Hospital followup, status post admission hospitalization at [facility] 08/12/17-08/21/17    HISTORY OF PRESENT ILLNESS:  This is an 45-YO male seen for hospital follow-up.  He was admitted to [facility] for short-term rehabilitation on 08/21/17 following an admission at [facility] 11/1-11/10/18 for falls.  He has a PMH of HLD, HTN, BPH, chronic A. Fib, and small cell lung cancer. He has stage 3 A small cell lung cancer and completed concurrent chemotherapy/radiation in June 2018. He has atrial fibrillation and had pulmonary embolism a month ago, currently on Eliquis. He has been having falls, calf pain on his right  leg and severe back pain which has gotten worse. He was then admitted to the hospital. MRI showed extensive extraosseous epidural tumor extending through the thoracco lumbar spine, severe central canal stenosis at some levels and foraminal stenosis at others, no evidence of spinal cord compression. Rad Onc consulted and Tx to [facility] , started XRT.  He was seen in his room today in his room.    PAST MEDICAL HISTORY:  Past Medical History:  Diagnosis Date  . Allergic rhinitis due to pollen   . Anginal pain ([place])    Past medical history " does not seem to be a problem at all now."  . Cancer associated pain 02/08/2017  . Coronary atherosclerosis of native coronary artery   . Depressive disorder, not elsewhere classified   . Dysuria 01/18/2017  . Encounter for antineoplastic chemotherapy 11/30/2016  . GERD (gastroesophageal reflux disease)   . Goals of care, counseling/discussion 11/30/2016  . Headache    PMH: Migraines  . Herpes genitalia   . Hypertrophy of prostate without urinary obstruction and other lower urinary tract symptoms (LUTS)   . Lack of coordination   . Lumbago   . Nausea and vomiting 11/30/2016  . Osteoarthrosis, unspecified whether generalized or localized, unspecified site   . Other and unspecified hyperlipidemia   . Other malaise and fatigue   . Pneumonia   . Reflux esophagitis   . Skin cancer   . Sleep  apnea    does not wear CPAP  . Syncope and collapse   . Urinary frequency   . Vitamin D deficiency      CURRENT MEDICATIONS: Reviewed    Medication List        Accurate as of 08/23/17 11:31 AM. Always use your most recent med list.          aspirin 81 MG tablet   dexamethasone 4 MG tablet Commonly known as:  DECADRON Take 1 tablet (4 mg total) 2 (two) times daily by mouth. To be tapered/discontinued by radiation oncology   ELIQUIS 5 MG Tabs tablet Generic drug:  apixaban   escitalopram 10 MG tablet Commonly known as:  LEXAPRO Take 1 tablet (10 mg  total) daily by mouth.   fentaNYL 25 MCG/HR patch Commonly known as:  DURAGESIC - dosed mcg/hr Place 1 patch (25 mcg total) every 3 (three) days onto the skin.   gabapentin 300 MG capsule Commonly known as:  NEURONTIN Take 1 capsule (300 mg total) by mouth 3 (three) times daily.   GLUCOSAMINE CHONDR COMPLEX PO   morphine 15 MG tablet Commonly known as:  MSIR Take 1 tablet (15 mg total) every 4 (four) hours as needed for up to 3 days by mouth for severe pain.   multivitamin with minerals tablet   * NUTRITIONAL SUPPLEMENT PO   * NUTRITIONAL SUPPLEMENT Liqd   OLANZapine 5 MG tablet Commonly known as:  ZYPREXA Take 1 tablet (5 mg total) at bedtime by mouth.   pantoprazole 20 MG tablet Commonly known as:  PROTONIX TAKE 1 TABLET(20 MG) BY MOUTH DAILY   REFRESH DRY EYE THERAPY OP   tamsulosin 0.4 MG Caps capsule Commonly known as:  FLOMAX TAKE 1 CAPSULE(0.4 MG) BY MOUTH DAILY      * This list has 2 medication(s) that are the same as other medications prescribed for you. Read the directions carefully, and ask your doctor or other care provider to review them with you.           Allergies  Allergen Reactions  . Chantix [Varenicline] Other (See Comments)    Makes patient suicidal  . Zoloft [Sertraline Hcl] Other (See Comments)    Makes patient suicidal     REVIEW OF SYSTEMS:  GENERAL: no change in appetite, no fatigue, no weight changes, no fever, chills MOUTH and THROAT: Denies oral discomfort, gingival pain  RESPIRATORY: no cough, SOB, DOE, wheezing, hemoptysis CARDIAC: no chest pain, edema or palpitations GI: no abdominal pain, diarrhea, constipation, heart burn, nausea or vomiting PSYCHIATRIC: Denies feeling of depression or anxiety. No report of hallucinations, insomnia, paranoia, or agitation    PHYSICAL EXAMINATION  GENERAL APPEARANCE:  In no acute distress.  SKIN:  Skin is warm and dry.  MOUTH and THROAT: Lips are without lesions. Oral mucosa is moist  and without lesions. RESPIRATORY: breathing is even & unlabored, BS CTAB CARDIAC: RRR, no murmur,no extra heart sounds, no edema GI: abdomen soft, normal BS, no masses, no tenderness, no hepatomegaly, no splenomegaly EXTREMITIES:  Able to move X 4 extremities, RLE weakness PSYCHIATRIC: Alert and oriented X 3. Affect and behavior are appropriate   LABS/RADIOLOGY: Labs reviewed: Basic Metabolic Panel: Recent Labs    07/15/17 0503  08/19/17 0414 08/20/17 0410 08/21/17 0419  NA 139   < > 138 134* 136  K 3.6   < > 4.3 4.1 4.0  CL 104   < > 100* 94* 98*  CO2 26   < > 28  27 28  GLUCOSE 94   < > 127* 134* 176*  BUN 14   < > 32* 28* 32*  CREATININE 1.09   < > 1.06 0.90 1.04  CALCIUM 8.6*   < > 9.2 9.1 8.9  MG 1.5*  --   --   --   --    < > = values in this interval not displayed.   Liver Function Tests: Recent Labs    04/26/17 0747 07/13/17 0405 07/29/17 0926  AST 17 37 72*  ALT 10 11* 16  ALKPHOS 72 64 70  BILITOT 0.45 0.8 0.52  PROT 6.8 7.0 7.1  ALBUMIN 3.4* 3.5 3.1*   Recent Labs    07/13/17 0405  LIPASE 31   No results for input(s): AMMONIA in the last 8760 hours. CBC: Recent Labs    04/26/17 0747  07/29/17 0926 08/12/17 2129  08/19/17 0414 08/20/17 0410 08/21/17 0419  WBC 5.5   < > 7.8 7.2   < > 10.4 10.8* 8.8  NEUTROABS 3.7  --  5.7 6.0  --   --   --   --   HGB 10.4*   < > 9.9* 9.2*   < > 8.7* 9.3* 8.4*  HCT 33.6*   < > 32.6* 30.6*   < > 28.7* 30.2* 27.7*  MCV 100.6*   < > 84.2 84.3   < > 84.4 84.1 85.0  PLT 184   < > 180 192   < > 149* 157 166   < > = values in this interval not displayed.   Lipid Panel: Recent Labs    02/15/17  HDL 47   Cardiac Enzymes: Recent Labs    07/13/17 1130 07/13/17 1752 07/13/17 2304  TROPONINI <0.03 <0.03 0.03*   CBG: Recent Labs    12/03/16 1251  GLUCAP 111*      Dg Chest 2 View  Result Date: 08/12/2017 CLINICAL DATA:  Right-sided pain after fall from bed. History of pneumonia. Former smoker. EXAM: CHEST   2 VIEW COMPARISON:  07/13/2017 chest CT FINDINGS: Top-normal size heart with aortic atherosclerosis. Port catheter is noted with tip in the distal SVC. Mild hyperinflation of the upper lobes with crowding of interstitial lung markings in the lower lobes. Streaky parenchymal opacities at the left base some which likely reflect chronic post radiation change and/or atelectasis. Superimposed pneumonia would be difficult to entirely exclude but based on history of fall, believed less likely. No acute nor suspicious osseous abnormalities. No pneumothorax. IMPRESSION: 1. Aortic atherosclerosis. 2. Left basilar atelectasis, scarring and/or post radiated change. Superimposed subtle pneumonia is not entirely excluded but based on clinical history, believed less likely. 3. No acute fracture or pneumothorax. Electronically Signed   By: Ashley Royalty M.D.   On: 08/12/2017 23:41   Dg Lumbar Spine Complete  Result Date: 08/12/2017 CLINICAL DATA:  Pain after fall EXAM: LUMBAR SPINE - COMPLETE 4+ VIEW COMPARISON:  CXR 10/23/2016 FINDINGS: Lumbosacral transitional vertebral body. S1-S2 disc noted based on 12 ribbed thoracic vertebral bodies on the chest radiograph. The lowest square vertebral body will be labeled S1. No acute compression fracture. There is moderate disc space narrowing L4-5, L5-S1 and S1-S2 with associated degenerative facet arthropathy. Minimal grade 1 anterolisthesis of L5 on S1. Aortoiliac atherosclerosis noted without definite aneurysm. IMPRESSION: 1. Transitional lumbosacral vertebral body. 2. Lower lumbar degenerative disc and facet arthropathy L4 through S2. 3. No acute osseous abnormality. 4. Minimal grade 1 anterolisthesis of L5 on S1. Electronically Signed   By:  Ashley Royalty M.D.   On: 08/12/2017 23:38   Ct Head Wo Contrast  Result Date: 08/12/2017 CLINICAL DATA:  Fall while on anti coagulation therapy. EXAM: CT HEAD WITHOUT CONTRAST TECHNIQUE: Contiguous axial images were obtained from the base of  the skull through the vertex without intravenous contrast. COMPARISON:  Brain MRI 06/04/2017 FINDINGS: Brain: No mass lesion, intraparenchymal hemorrhage or extra-axial collection. No evidence of acute cortical infarct. There is periventricular hypoattenuation compatible with chronic microvascular disease. Mild atrophy Vascular: No hyperdense vessel or unexpected calcification. Skull: Normal visualized skull base, calvarium and extracranial soft tissues. Sinuses/Orbits: No sinus fluid levels or advanced mucosal thickening. No mastoid effusion. Normal orbits. IMPRESSION: Mild atrophy and chronic microvascular disease without acute abnormality. Electronically Signed   By: Ulyses Jarred M.D.   On: 08/12/2017 23:29   Mr Thoracic Spine W Wo Contrast  Result Date: 08/13/2017 CLINICAL DATA:  Small cell lung cancer. Increased falls over the last 2 weeks. Right lower extremity pain and severe low back pain. Progressive numbness and weakness in the right lower extremity. Unable to walk. EXAM: MRI THORACIC AND LUMBAR SPINE WITHOUT AND WITH CONTRAST TECHNIQUE: Multiplanar and multiecho pulse sequences of the thoracic and lumbar spine were obtained without and with intravenous contrast. CONTRAST:  72mL MULTIHANCE GADOBENATE DIMEGLUMINE 529 MG/ML IV SOLN COMPARISON:  Lumbar spine radiographs 08/12/2017. PET scan 12/03/2016. FINDINGS: MRI THORACIC SPINE FINDINGS Alignment: AP alignment is anatomic. Rightward curvature of the thoracic spine is centered at T7-8. Vertebrae: Diffuse osseous metastatic disease is present. There is involvement of posterior elements on the right at T1 and bilaterally at T2. There is diffuse involvement of vertebral bodies throughout the remainder of the thoracic spine. Cord:  Normal signal is present throughout the thoracic spinal cord Paraspinal and other soft tissues: The left infrahilar mass is increased. There is further collapse or metastatic disease into the left lung. Dependent right low lower  lobe atelectasis is present. Bilateral effusions are noted, right greater than left. Numerous cystic lesions are again noted in the liver. Disc levels: T1-2: Facet hypertrophy contributes to foraminal narrowing, right greater than left. T2-3: Mild facet hypertrophy is present on the left without significant stenosis. T3-4:  No significant disc disease or stenosis. T4-5: Mild facet hypertrophy is present bilaterally without focal stenosis. T5-6: Facet hypertrophy is present bilaterally without focal stenosis. T6-7: Facet hypertrophy is present. Extraosseous tumor is present on the right without foraminal invasion. T7-8: Extraosseous tumor extends into the right neural foramen with moderate right foraminal stenosis. The left foramen is patent. T8-9: Extraosseous tumor is noted beyond the left foramen. Facet hypertrophy contributes to mild foraminal narrowing, left greater than right. T9-10: Disc disease and facet hypertrophy contributes to mild foraminal narrowing bilaterally. T10-11: Disc disease and facet hypertrophy contributes to mild right foraminal narrowing. A perineural root sleeve cyst is present on the left. T11-12: Metastatic disease involves the right rib proximally without focal stenosis. Facet spurring contributes to mild left foraminal narrowing. A perineural root sleeve cyst is present on the right. T12-L1: Schmorl's nodes are present without significant stenosis. MRI LUMBAR SPINE FINDINGS Segmentation:  Transitional anatomy is present at S1. Alignment: Degenerative anterolisthesis is present at S1-2 and L5-S1. There is slight degenerative retrolisthesis at L4-5. Vertebrae: Extensive osseous metastatic disease is present throughout the lumbar spine and sacrum. Conus medullaris: Extends to the L1 level and appears normal. Paraspinal and other soft tissues: Extraosseous tumor is noted on the right at S1. This likely impacts the right S1 foramen anteriorly. There  is distention of the urinary bladder with  wall thickening. This may be neurogenic. Disc levels: Diffuse extraosseous tumor is present in the posterior epidural space from L2-3 through L3-4. The enhancing soft tissue mass measures 4.6 x 0.8 cm on the sagittal images. The tumor is 1.3 cm wide. This results in severe central canal stenosis at L3. The canal is narrowed to 3 mm in the midline. L1-2: Facet hypertrophy contributes to mild foraminal narrowing bilaterally, worse on the right. L2-3: A broad-based disc protrusion is present. Mild facet hypertrophy is noted. There is no significant stenosis at this level. L3-4: In addition to tumor, a broad-based disc protrusion and facet hypertrophy contribute to moderate central canal stenosis. Moderate foraminal narrowing is worse on the left. L4-5: Extraosseous tumor extends into the right neural foramen resulting in severe right foraminal stenosis. Moderate left foraminal narrowing is due to disc disease and facet hypertrophy. Tumor also contributes to moderate right subarticular stenosis. L5-S1: A broad-based disc protrusion is present. Advanced facet hypertrophy is noted. This results an moderate subarticular stenosis bilaterally. Moderate left and mild right foraminal narrowing is present. Extraosseous tumor extends from the posterior elements into the right posterolateral aspect of the canal the superior right foramen. S1-2: Anterolisthesis results in uncovering of the disc with right greater than left subarticular and foraminal stenosis. IMPRESSION: 1. 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 The posterior tumor at L2-3 measures 4.6 x 0.8 x 1.3 cm. 2. Extraosseous tumor extends into the right foramen at L4-5 with severe right foraminal stenosis. 3. Extraosseous tumor and degenerative change contribute to moderate right subarticular and mild right foraminal stenosis at L5-S1. 4. Typical degenerative changes are also present in the lumbar spine resulting in  moderate foraminal narrowing bilaterally at L3-4, left greater than right. 5. Degenerate changes contribute to moderate left foraminal narrowing at L4-5. 6. Degenerative changes contribute to moderate left subarticular and foraminal narrowing at L5-S1. 7. Degenerative subarticular and foraminal narrowing at S1-2 is worse on the right. Prominent extraosseous tumor from the right sacral ala extends into the ventral soft tissues and likely impacts the ventral S1 foramen. 8. Diffuse osseous metastatic disease throughout the thoracic and lumbar spine. 9. Extraosseous tumor extends into the right neural foramen at T7-8 with moderate right foraminal stenosis. 10. Extraosseous tumor is noted just beyond the left foramen at T8-9. This could impact the left T8 nerve root. 11. No other focal stenosis of the thoracic spine due to tumor. 12. Multilevel degenerative change and foraminal narrowing in the thoracic spine due to the endplate change and facet disease. Critical Value/emergent results were called by telephone at the time of interpretation on 08/13/2017 at 6:34 pm to Dr. Randa Spike , who verbally acknowledged these results. Electronically Signed   By: San Morelle M.D.   On: 08/13/2017 18:55   Mr Lumbar Spine W Wo Contrast  Result Date: 08/13/2017 CLINICAL DATA:  Small cell lung cancer. Increased falls over the last 2 weeks. Right lower extremity pain and severe low back pain. Progressive numbness and weakness in the right lower extremity. Unable to walk. EXAM: MRI THORACIC AND LUMBAR SPINE WITHOUT AND WITH CONTRAST TECHNIQUE: Multiplanar and multiecho pulse sequences of the thoracic and lumbar spine were obtained without and with intravenous contrast. CONTRAST:  22mL MULTIHANCE GADOBENATE DIMEGLUMINE 529 MG/ML IV SOLN COMPARISON:  Lumbar spine radiographs 08/12/2017. PET scan 12/03/2016. FINDINGS: MRI THORACIC SPINE FINDINGS Alignment: AP alignment is anatomic. Rightward curvature of the thoracic spine  is  centered at T7-8. Vertebrae: Diffuse osseous metastatic disease is present. There is involvement of posterior elements on the right at T1 and bilaterally at T2. There is diffuse involvement of vertebral bodies throughout the remainder of the thoracic spine. Cord:  Normal signal is present throughout the thoracic spinal cord Paraspinal and other soft tissues: The left infrahilar mass is increased. There is further collapse or metastatic disease into the left lung. Dependent right low lower lobe atelectasis is present. Bilateral effusions are noted, right greater than left. Numerous cystic lesions are again noted in the liver. Disc levels: T1-2: Facet hypertrophy contributes to foraminal narrowing, right greater than left. T2-3: Mild facet hypertrophy is present on the left without significant stenosis. T3-4:  No significant disc disease or stenosis. T4-5: Mild facet hypertrophy is present bilaterally without focal stenosis. T5-6: Facet hypertrophy is present bilaterally without focal stenosis. T6-7: Facet hypertrophy is present. Extraosseous tumor is present on the right without foraminal invasion. T7-8: Extraosseous tumor extends into the right neural foramen with moderate right foraminal stenosis. The left foramen is patent. T8-9: Extraosseous tumor is noted beyond the left foramen. Facet hypertrophy contributes to mild foraminal narrowing, left greater than right. T9-10: Disc disease and facet hypertrophy contributes to mild foraminal narrowing bilaterally. T10-11: Disc disease and facet hypertrophy contributes to mild right foraminal narrowing. A perineural root sleeve cyst is present on the left. T11-12: Metastatic disease involves the right rib proximally without focal stenosis. Facet spurring contributes to mild left foraminal narrowing. A perineural root sleeve cyst is present on the right. T12-L1: Schmorl's nodes are present without significant stenosis. MRI LUMBAR SPINE FINDINGS Segmentation:   Transitional anatomy is present at S1. Alignment: Degenerative anterolisthesis is present at S1-2 and L5-S1. There is slight degenerative retrolisthesis at L4-5. Vertebrae: Extensive osseous metastatic disease is present throughout the lumbar spine and sacrum. Conus medullaris: Extends to the L1 level and appears normal. Paraspinal and other soft tissues: Extraosseous tumor is noted on the right at S1. This likely impacts the right S1 foramen anteriorly. There is distention of the urinary bladder with wall thickening. This may be neurogenic. Disc levels: Diffuse extraosseous tumor is present in the posterior epidural space from L2-3 through L3-4. The enhancing soft tissue mass measures 4.6 x 0.8 cm on the sagittal images. The tumor is 1.3 cm wide. This results in severe central canal stenosis at L3. The canal is narrowed to 3 mm in the midline. L1-2: Facet hypertrophy contributes to mild foraminal narrowing bilaterally, worse on the right. L2-3: A broad-based disc protrusion is present. Mild facet hypertrophy is noted. There is no significant stenosis at this level. L3-4: In addition to tumor, a broad-based disc protrusion and facet hypertrophy contribute to moderate central canal stenosis. Moderate foraminal narrowing is worse on the left. L4-5: Extraosseous tumor extends into the right neural foramen resulting in severe right foraminal stenosis. Moderate left foraminal narrowing is due to disc disease and facet hypertrophy. Tumor also contributes to moderate right subarticular stenosis. L5-S1: A broad-based disc protrusion is present. Advanced facet hypertrophy is noted. This results an moderate subarticular stenosis bilaterally. Moderate left and mild right foraminal narrowing is present. Extraosseous tumor extends from the posterior elements into the right posterolateral aspect of the canal the superior right foramen. S1-2: Anterolisthesis results in uncovering of the disc with right greater than left  subarticular and foraminal stenosis. IMPRESSION: 1. 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 The posterior tumor at L2-3 measures 4.6 x 0.8 x  1.3 cm. 2. Extraosseous tumor extends into the right foramen at L4-5 with severe right foraminal stenosis. 3. Extraosseous tumor and degenerative change contribute to moderate right subarticular and mild right foraminal stenosis at L5-S1. 4. Typical degenerative changes are also present in the lumbar spine resulting in moderate foraminal narrowing bilaterally at L3-4, left greater than right. 5. Degenerate changes contribute to moderate left foraminal narrowing at L4-5. 6. Degenerative changes contribute to moderate left subarticular and foraminal narrowing at L5-S1. 7. Degenerative subarticular and foraminal narrowing at S1-2 is worse on the right. Prominent extraosseous tumor from the right sacral ala extends into the ventral soft tissues and likely impacts the ventral S1 foramen. 8. Diffuse osseous metastatic disease throughout the thoracic and lumbar spine. 9. Extraosseous tumor extends into the right neural foramen at T7-8 with moderate right foraminal stenosis. 10. Extraosseous tumor is noted just beyond the left foramen at T8-9. This could impact the left T8 nerve root. 11. No other focal stenosis of the thoracic spine due to tumor. 12. Multilevel degenerative change and foraminal narrowing in the thoracic spine due to the endplate change and facet disease. Critical Value/emergent results were called by telephone at the time of interpretation on 08/13/2017 at 6:34 pm to Dr. Randa Spike , who verbally acknowledged these results. Electronically Signed   By: San Morelle M.D.   On: 08/13/2017 18:55   Dg Foot Complete Right  Result Date: 08/12/2017 CLINICAL DATA:  Bruising of the right toes after fall today. EXAM: RIGHT FOOT COMPLETE - 3+ VIEW COMPARISON:  None. FINDINGS: Hallux rigidus of the great toe with  marked joint space narrowing and spurring of the first MTP. No acute fracture or malalignment is noted. There is dorsal soft tissue swelling of the forefoot. There is a tiny plantar calcaneal enthesophyte. IMPRESSION: 1. Hallux rigidus of the first MTP with marked joint space narrowing and spurring. 2. No acute osseous abnormality. 3. Dorsal soft tissue swelling over the forefoot. 4. Small plantar calcaneal enthesophyte. Electronically Signed   By: Ashley Royalty M.D.   On: 08/12/2017 23:32   Dg Hip Unilat With Pelvis 1v Left  Result Date: 08/12/2017 CLINICAL DATA:  Loss of balance and fell off bed.  Left hip pain. EXAM: DG HIP (WITH OR WITHOUT PELVIS) 1V*L* COMPARISON:  None. FINDINGS: There is no evidence of hip fracture or dislocation. There is degenerative disc space narrowing at L4-5. L5-S1 degenerative facet hypertrophy and sclerosis. No suspicious osseous lesions of the bony pelvis and hips. Intact sacroiliac joints and pubic symphysis. IMPRESSION: No acute fracture or malalignment of either hip. There is lower lumbar facet arthropathy and disc disease. Electronically Signed   By: Ashley Royalty M.D.   On: 08/12/2017 23:30    ASSESSMENT/PLAN:  1. Lumbar spine tumor  - follow-up with radiation oncology, palliative consult, continue dexamethasone 4 mg 1 tab twice a day   2. Urinary tract infection without hematuria, site unspecified - completed 5 day course of antibiotic   3.  Chronic depression - mood  is stable, continue Lexapro 10 mg 1 tab daily and Zyprexa 5 mg 1 tab daily at bedtime   4. Chronic pain due to neoplasm - continue morphine sulfate IR 15 mg 1 tab every 4 hours when necessary 3 days, fentanyl 25 g/hour 1 patch on the skin every 3 days, gabapentin 300 mg 1 capsule 3 times a day   5. Atrial fibrillation, chronic (HCC) - continue Eliquis 5 mg 1 tab twice a day   6. Small  cell lung cancer, left [facility]) - currently on radiation treatment, palliative consult   7.  Benign prostatic  hyperplasia with urinary obstruction - continue Flomax 0.4 mg 1 capsule daily   8. Reflux esophagitis - continue Protonix 20 mg 1 tab daily    Goals of care:  Short-term rehabilitation    Halli Equihua C. Fairbanks - NP    [employer] [phone]

## 2017-08-23 NOTE — Telephone Encounter (Signed)
This is a patient of [place], who was admitted to [facility] after hospitalization. [facility] F/U is needed. Hospital discharge from [facility] on 08/21/2017

## 2017-08-23 NOTE — Telephone Encounter (Signed)
Called [facility] at [phone] with Neoma Laming, his rad tx time is at noon today, asked if they are aware and are arrangint trasnportation for him "?, "yes, he will be here for his rad tx at noon", thanked Neoma Laming, called [facility], spoke with Anderson Malta RT therapist,  10:26 AM

## 2017-08-24 ENCOUNTER — Non-Acute Institutional Stay (SKILLED_NURSING_FACILITY): Payer: Medicare Other | Admitting: Internal Medicine

## 2017-08-24 ENCOUNTER — Encounter: Payer: Self-pay | Admitting: Internal Medicine

## 2017-08-24 ENCOUNTER — Ambulatory Visit
Admission: RE | Admit: 2017-08-24 | Discharge: 2017-08-24 | Disposition: A | Discharge status: Home / Self Care | Payer: Medicare Other | Source: Ambulatory Visit | Attending: Radiation Oncology | Admitting: Radiation Oncology

## 2017-08-24 DIAGNOSIS — R441 Visual hallucinations: Secondary | ICD-10-CM

## 2017-08-24 DIAGNOSIS — I482 Chronic atrial fibrillation, unspecified: Secondary | ICD-10-CM

## 2017-08-24 DIAGNOSIS — C3492 Malignant neoplasm of unspecified part of left bronchus or lung: Secondary | ICD-10-CM | POA: Diagnosis not present

## 2017-08-24 DIAGNOSIS — F331 Major depressive disorder, recurrent, moderate: Secondary | ICD-10-CM | POA: Diagnosis not present

## 2017-08-24 DIAGNOSIS — Z510 Encounter for antineoplastic radiation therapy: Secondary | ICD-10-CM | POA: Diagnosis not present

## 2017-08-24 NOTE — Patient Instructions (Signed)
See assessment and plan under each diagnosis in the problem list and acutely for this visit 

## 2017-08-24 NOTE — Addendum Note (Signed)
Encounter addended by: Kyung Rudd, MD on: 08/24/2017 6:10 PM  Actions taken: Sign clinical note

## 2017-08-24 NOTE — Assessment & Plan Note (Addendum)
08/24/17 clinically rhythm is regular. Continue Eliquis initiated 10/10 ; low-dose aspirin will be discontinued because of high risk of falls and questionable indication with the novel oral anticoagulant. ASA was started 01/06/13

## 2017-08-24 NOTE — Assessment & Plan Note (Signed)
Psych assessment @ SNF if hallucinations persist or progress

## 2017-08-24 NOTE — Assessment & Plan Note (Signed)
Psych follow-up at [facility]

## 2017-08-24 NOTE — Progress Notes (Signed)
NURSING HOME LOCATION:  [facility] ROOM NUMBER:  [id number]  CODE STATUS:  Full Code  PCP:  [name], NP  [place] [address]   This is a comprehensive admission note to [facility] performed on this date less than 30 days from date of admission. Included are preadmission medical/surgical history;reconciled medication list; family history; social history and comprehensive review of systems.  Corrections and additions to the records were documented . Comprehensive physical exam was also performed. Additionally a clinical summary was entered for each active diagnosis pertinent to this admission in the Problem List to enhance continuity of care.  HPI: Patient was hospitalized 11/1-11/10/18 admitted for intractable back pain which had been progressive over 2 weeks prior to admission. He has stage IIIa small cell lung cancer and had completed concurrent chemotherapy/radiation in June 2018. A month prior to admission he sustained a pulmonary embolism and was placed on Eliquis. MRI completion required anesthesia. Extensive extraosseous epidural tumor noted throughout the thoracolumbar spine with severe central canal stenosis at several levels and foraminal stenosis as well. No spinal cord compression was noted. Radiation therapy has been initiated for the spinal lesions. Addition to the back and leg pain he had weakness in the legs despite absence of spinal cord compression. IV Decadron was initiated and transitioned to oral Decadron in view of MRI findings the small cell lung cancer was now stage IV. Pain control required fentanyl patch and morphine 15 mg every 4 hours. Lidocaine patch and gabapentin were also prescribed. Neurosurgery did not recommend surgical intervention for the lower extremity weaknesses. The patient did have "passive suicidal ideation" on 11/9 but did not express any active suicidal ideation. This was addressed with addition of Zyprexa  and increase in Lexapro. Enterococcal UTI was documented 11/4; repeat C&S was negative 11/9 after 5 days of antibiotics. He did have persistent frequency.  Eliquis was resumed for chronic atrial fibrillation & hx of PTE. Palliative care with possible transition to hospice has been considered. Labs 11/10 revealed glucose of 176, BUN 32, hemoglobin 8.4, hematocrit 27.7. Indices were normal sedimentation rate, hypochromic.   Past medical and surgical history: Includes dyslipidemia, hypertension, sleep apnea, GERD with esophagitis, BPH with urinary obstruction, and depression.  Pulmonary performed video bronchoscopy with endobronchial ultrasound.  Social history:Nondrinker; one pack per day smoker 3 decades.  Family history:His father and 2 brothers had cancer.  Review of systems: When asked the date he consults his speaking wristwatch. When asked the name of the president, he replied "the great [place]". Responses were slow. He exhibited some difficulty with word retrieval. He does describe some visual hallucinations of seeing dogs and cats in the room. Also he described episodes where he thought he was in a fruit or vegetable store. He realizes these are hallucinations and does not find them frightening. He also has had some "fluttering" of his vision. He has markedly decreased vision in the right eye which he relates to macular degeneration which did not respond to surgical intervention. His major symptom or concern is numbness in the right lower extremity with associated profound weakness. He states he has difficulty telling where the leg is placed. He also describes pins and needles in the extremities.  Constitutional: No fever Eyes: No redness, discharge, pain ENT/mouth: No nasal congestion,  purulent discharge, earache,change in hearing ,sore throat  Cardiovascular: No chest pain, palpitations,paroxysmal nocturnal dyspnea, claudication, edema  Respiratory: No cough, sputum  production,hemoptysis, DOE , significant snoring,apnea  Gastrointestinal: No  heartburn,dysphagia,abdominal pain, nausea / vomiting,rectal bleeding, melena,change in bowels Genitourinary: No dysuria,hematuria, pyuria,  incontinence, nocturia Musculoskeletal: No joint stiffness, joint swelling, weakness,pain Dermatologic: No rash, pruritus, change in appearance of skin Neurologic: No dizziness,headache,syncope, seizures, numbness , tingling Psychiatric: No significant anxiety , depression, insomnia, anorexia Endocrine: No change in hair/skin/ nails, excessive thirst, excessive hunger, excessive urination  Hematologic/lymphatic: No significant bruising, lymphadenopathy,abnormal bleeding Allergy/immunology: No itchy/ watery eyes, significant sneezing, urticaria, angioedema  Physical exam:  Pertinent or positive findings: He has a hearing aid on the left but is still hard of hearing. He has pattern alopecia. Pupils are pinpoint. There is large keratosis of the right temple. Exotropia is present on the right. There is decreased vision to confrontation on the right. He has some bronchovesicular quality breath sounds in left lower lobe. He has scattered low-grade rales. Gallop cadence is present. He has profound weakness and essentially no range of motion of the right lower extremity. He has numbness to touch over the right lower extremity. He is unable to define the position of his right leg. There is some weakness in the left lower extremity as well. Pulses are decreased. He has tattoos in the form of script over the forearms.  General appearance: no acute distress , increased work of breathing is present.   Lymphatic: No lymphadenopathy about the head, neck, axilla . Eyes: No conjunctival inflammation or lid edema is present. There is no scleral icterus. Ears:  External ear exam shows no significant lesions or deformities.   Nose:  External nasal examination shows no deformity or inflammation. Nasal  mucosa are pink and moist without lesions ,exudates Oral exam: lips and gums are healthy appearing.There is no oropharyngeal erythema or exudate . Neck:  No thyromegaly, masses, tenderness noted.    Heart:  No murmur, click, rub .  Abdomen:Bowel sounds are normal. Abdomen is soft and nontender with no organomegaly, hernias,masses. GU: deferred  Extremities:  No cyanosis, clubbing,edema  Neurologic exam : Balance,Rhomberg,finger to nose testing could not be completed due to clinical state Skin: Warm & dry w/o tenting. No significant lesions or rash.  See clinical summary under each active problem in the Problem List with associated updated therapeutic plan

## 2017-08-24 NOTE — Progress Notes (Signed)
  Radiation Oncology         [phone] ________________________________  Name: Christian Munoz MRN: [id number]  Date: 08/16/2017  DOB: 1935-01-14  SIMULATION AND TREATMENT PLANNING NOTE  DIAGNOSIS:     ICD-10-CM   1. Spine metastasis ([place]) C79.51      Site: Lumbar spine  NARRATIVE:  The patient was brought to the [facility].  Identity was confirmed.  All relevant records and images related to the planned course of therapy were reviewed.   Written consent to proceed with treatment was confirmed which was freely given after reviewing the details related to the planned course of therapy had been reviewed with the patient.  Then, the patient was set-up in a stable reproducible  supine position for radiation therapy.  CT images were obtained.  Surface markings were placed.    Medically necessary complex treatment device(s) for immobilization: Customized Vac-Lok bag.   The CT images were loaded into the planning software.  Then the target and avoidance structures were contoured.  Treatment planning then occurred.  The radiation prescription was entered and confirmed.  A total of 3 complex treatment devices were fabricated which relate to the designed radiation treatment fields. Each of these customized fields/ complex treatment devices will be used on a daily basis during the radiation course. I have requested : 3D Simulation  I have requested a DVH of the following structures: A target volume, spinal cord, left kidney, right kidney.   PLAN:  The patient will receive 30 Gy in 10 fractions.  ________________________________   Jodelle Gross, MD, PhD

## 2017-08-24 NOTE — Assessment & Plan Note (Signed)
Radiation therapy to spine being conducted

## 2017-08-25 ENCOUNTER — Ambulatory Visit
Admission: RE | Admit: 2017-08-25 | Discharge: 2017-08-25 | Disposition: A | Discharge status: Home / Self Care | Payer: Medicare Other | Source: Ambulatory Visit | Attending: Radiation Oncology | Admitting: Radiation Oncology

## 2017-08-25 DIAGNOSIS — Z510 Encounter for antineoplastic radiation therapy: Secondary | ICD-10-CM | POA: Diagnosis not present

## 2017-08-26 ENCOUNTER — Ambulatory Visit
Admission: RE | Admit: 2017-08-26 | Discharge: 2017-08-26 | Disposition: A | Discharge status: Home / Self Care | Payer: Medicare Other | Source: Ambulatory Visit | Attending: Radiation Oncology | Admitting: Radiation Oncology

## 2017-08-26 DIAGNOSIS — Z510 Encounter for antineoplastic radiation therapy: Secondary | ICD-10-CM | POA: Diagnosis not present

## 2017-08-27 ENCOUNTER — Ambulatory Visit
Admission: RE | Admit: 2017-08-27 | Discharge: 2017-08-27 | Disposition: A | Discharge status: Home / Self Care | Payer: Medicare Other | Source: Ambulatory Visit | Attending: Radiation Oncology | Admitting: Radiation Oncology

## 2017-08-27 ENCOUNTER — Encounter: Payer: Self-pay | Admitting: Radiation Oncology

## 2017-08-27 DIAGNOSIS — Z510 Encounter for antineoplastic radiation therapy: Secondary | ICD-10-CM | POA: Diagnosis not present

## 2017-08-31 ENCOUNTER — Encounter: Payer: Self-pay | Admitting: Adult Health

## 2017-08-31 ENCOUNTER — Non-Acute Institutional Stay (SKILLED_NURSING_FACILITY): Payer: Medicare Other | Admitting: Adult Health

## 2017-08-31 DIAGNOSIS — N138 Other obstructive and reflux uropathy: Secondary | ICD-10-CM | POA: Diagnosis not present

## 2017-08-31 DIAGNOSIS — K2100 Gastro-esophageal reflux disease with esophagitis, without bleeding: Secondary | ICD-10-CM

## 2017-08-31 DIAGNOSIS — N390 Urinary tract infection, site not specified: Secondary | ICD-10-CM

## 2017-08-31 DIAGNOSIS — C3492 Malignant neoplasm of unspecified part of left bronchus or lung: Secondary | ICD-10-CM

## 2017-08-31 DIAGNOSIS — N401 Enlarged prostate with lower urinary tract symptoms: Secondary | ICD-10-CM | POA: Diagnosis not present

## 2017-08-31 DIAGNOSIS — G893 Neoplasm related pain (acute) (chronic): Secondary | ICD-10-CM

## 2017-08-31 DIAGNOSIS — F329 Major depressive disorder, single episode, unspecified: Secondary | ICD-10-CM | POA: Diagnosis not present

## 2017-08-31 DIAGNOSIS — I482 Chronic atrial fibrillation, unspecified: Secondary | ICD-10-CM

## 2017-08-31 DIAGNOSIS — F32A Depression, unspecified: Secondary | ICD-10-CM

## 2017-08-31 DIAGNOSIS — D492 Neoplasm of unspecified behavior of bone, soft tissue, and skin: Secondary | ICD-10-CM | POA: Diagnosis not present

## 2017-08-31 NOTE — Progress Notes (Signed)
DATE:  08/31/2017   MRN:  [id number]  BIRTHDAY: [birth year]  Facility:  Nursing Home Location:  [facility] Room Number: 215-A  LEVEL OF CARE:  SNF (31)  Contact Information    Name Relation Home Work Mobile   [place] Son [phone]         Code Status History    Date Active Date Inactive Code Status Order ID Comments User Context   08/13/2017 12:20 08/21/2017 19:08 DNR [id number]  Lady Deutscher, MD ED   07/13/2017 11:30 07/15/2017 18:13 Full Code [id number]  Waldemar Dickens, MD ED   01/14/2017 13:41 07/13/2017 03:44 DNR [id number]  [name], NP Outpatient   04/04/2015 14:44 10/23/2016 15:07 DNR [id number]  [name], NP Outpatient    Questions for Most Recent Historical Code Status (Order [id number])    Question Answer Comment   In the event of cardiac or respiratory ARREST Do not call a "code blue"    In the event of cardiac or respiratory ARREST Do not perform Intubation, CPR, defibrillation or ACLS    In the event of cardiac or respiratory ARREST Use medication by any route, position, wound care, and other measures to relive pain and suffering. May use oxygen, suction and manual treatment of airway obstruction as needed for comfort.        Chief Complaint  Patient presents with  . Discharge Note    Patient seen for discharge, is transferring to another SNF    HISTORY OF PRESENT ILLNESS:  This is an 82-YO male being seen for discharge.  He is transferring to [facility] per patient and family request.  He was seen in the room today.   He has been admitted to [facility] on 08/21/17 from [facility] admission date 11/1 to 08/21/17 due to falls He has a PMH of HLD, hypertension, BPH, chronic atrial fibrillation, and small cell lung cancer. He has a stage 3A small cell lung cancer. He has stage 3A small cell lung cancer and completed concurrent chemotherapy/radiation in June 2018. He has atrial  fibrillation and had pulmonary embolism a month ago, currently on Eliquis. He has been having falls,, calf pain on his right leg, and severe back pain which has gotten worse. He was then admitted to the hospital. MRI showed extensive extraosseous epidural tumor extending through the thoracolumbar spine, severe central canal stenosis at some levels, and foraminal stenosis at others, no evidence of spinal cord compression. Rad Onc consulted and Tx to [facility], started XRT.  He has been having rehabilitation @ [facility].   PAST MEDICAL HISTORY:  Past Medical History:  Diagnosis Date  . Allergic rhinitis due to pollen   . Anginal pain ([place])    Past medical history " does not seem to be a problem at all now."  . Cancer associated pain 02/08/2017  . Coronary atherosclerosis of native coronary artery   . Depressive disorder, not elsewhere classified   . Dysuria 01/18/2017  . Encounter for antineoplastic chemotherapy 11/30/2016  . GERD (gastroesophageal reflux disease)   . Goals of care, counseling/discussion 11/30/2016  . Headache    PMH: Migraines  . Herpes genitalia   . Hypertrophy of prostate without urinary obstruction and other lower urinary tract symptoms (LUTS)   . Lack of coordination   . Lumbago   . Nausea and vomiting 11/30/2016  . Osteoarthrosis, unspecified whether generalized or localized, unspecified site   . Other and unspecified  hyperlipidemia   . Other malaise and fatigue   . Pneumonia   . Reflux esophagitis   . Skin cancer   . Sleep apnea    does not wear CPAP  . Syncope and collapse   . Urinary frequency   . Vitamin D deficiency      CURRENT MEDICATIONS: Reviewed    Medication List        Accurate as of 08/31/17  1:21 PM. Always use your most recent med list.          dexamethasone 4 MG tablet Commonly known as:  DECADRON Take 1 tablet (4 mg total) 2 (two) times daily by mouth. To be tapered/discontinued by radiation oncology     ELIQUIS 5 MG Tabs tablet Generic drug:  apixaban   escitalopram 10 MG tablet Commonly known as:  LEXAPRO Take 1 tablet (10 mg total) daily by mouth.   fentaNYL 25 MCG/HR patch Commonly known as:  DURAGESIC - dosed mcg/hr Place 1 patch (25 mcg total) every 3 (three) days onto the skin.   gabapentin 300 MG capsule Commonly known as:  NEURONTIN Take 1 capsule (300 mg total) by mouth 3 (three) times daily.   GLUCOSAMINE CHONDR COMPLEX PO   morphine 15 MG tablet Commonly known as:  MSIR   multivitamin with minerals tablet   * NUTRITIONAL SUPPLEMENT PO   * NUTRITIONAL SUPPLEMENT Liqd   OLANZapine 5 MG tablet Commonly known as:  ZYPREXA Take 1 tablet (5 mg total) at bedtime by mouth.   pantoprazole 20 MG tablet Commonly known as:  PROTONIX TAKE 1 TABLET(20 MG) BY MOUTH DAILY   REFRESH DRY EYE THERAPY OP   tamsulosin 0.4 MG Caps capsule Commonly known as:  FLOMAX TAKE 1 CAPSULE(0.4 MG) BY MOUTH DAILY      * This list has 2 medication(s) that are the same as other medications prescribed for you. Read the directions carefully, and ask your doctor or other care provider to review them with you.           Allergies  Allergen Reactions  . Chantix [Varenicline] Other (See Comments)    Makes patient suicidal  . Zoloft [Sertraline Hcl] Other (See Comments)    Makes patient suicidal     REVIEW OF SYSTEMS:  GENERAL: no change in appetite, no fatigue, no weight changes, no fever, chills or weakness MOUTH and THROAT: Denies oral discomfort, gingival pain or bleeding, pain from teeth or hoarseness   RESPIRATORY: no cough, SOB, DOE, wheezing, hemoptysis CARDIAC: no chest pain, edema or palpitations GI: no abdominal pain, diarrhea, constipation, heart burn, nausea or vomiting GU: Denies dysuria, frequency, hematuria, incontinence, or discharge PSYCHIATRIC: Denies feeling of depression or anxiety. No report of hallucinations, insomnia, paranoia, or agitation   PHYSICAL  EXAMINATION  GENERAL APPEARANCE: Well nourished. In no acute distress. Normal body habitus SKIN:  Skin is warm and dry.  MOUTH and THROAT: Lips are without lesions. Oral mucosa is moist and without lesions.  RESPIRATORY: breathing is even & unlabored, BS CTAB CARDIAC: RRR, no murmur,no extra heart sounds, no edema, right chest porta cath GI: abdomen soft, normal BS, no masses, no tenderness EXTREMITIES: Able to move BUE and LLE, weakness on RLE PSYCHIATRIC: Alert and oriented X 3. Affect and behavior are appropriate   LABS/RADIOLOGY: Labs reviewed: Basic Metabolic Panel: Recent Labs    07/15/17 0503  08/19/17 0414 08/20/17 0410 08/21/17 0419  NA 139   < > 138 134* 136  K 3.6   < >  4.3 4.1 4.0  CL 104   < > 100* 94* 98*  CO2 26   < > 28 27 28   GLUCOSE 94   < > 127* 134* 176*  BUN 14   < > 32* 28* 32*  CREATININE 1.09   < > 1.06 0.90 1.04  CALCIUM 8.6*   < > 9.2 9.1 8.9  MG 1.5*  --   --   --   --    < > = values in this interval not displayed.   Liver Function Tests: Recent Labs    04/26/17 0747 07/13/17 0405 07/29/17 0926  AST 17 37 72*  ALT 10 11* 16  ALKPHOS 72 64 70  BILITOT 0.45 0.8 0.52  PROT 6.8 7.0 7.1  ALBUMIN 3.4* 3.5 3.1*   Recent Labs    07/13/17 0405  LIPASE 31   CBC: Recent Labs    04/26/17 0747  07/29/17 0926 08/12/17 2129  08/19/17 0414 08/20/17 0410 08/21/17 0419  WBC 5.5   < > 7.8 7.2   < > 10.4 10.8* 8.8  NEUTROABS 3.7  --  5.7 6.0  --   --   --   --   HGB 10.4*   < > 9.9* 9.2*   < > 8.7* 9.3* 8.4*  HCT 33.6*   < > 32.6* 30.6*   < > 28.7* 30.2* 27.7*  MCV 100.6*   < > 84.2 84.3   < > 84.4 84.1 85.0  PLT 184   < > 180 192   < > 149* 157 166   < > = values in this interval not displayed.   Lipid Panel: Recent Labs    02/15/17  HDL 47   Cardiac Enzymes: Recent Labs    07/13/17 1130 07/13/17 1752 07/13/17 2304  TROPONINI <0.03 <0.03 0.03*   CBG: Recent Labs    12/03/16 1251  GLUCAP 111*      Dg Chest 2  View  Result Date: 08/12/2017 CLINICAL DATA:  Right-sided pain after fall from bed. History of pneumonia. Former smoker. EXAM: CHEST  2 VIEW COMPARISON:  07/13/2017 chest CT FINDINGS: Top-normal size heart with aortic atherosclerosis. Port catheter is noted with tip in the distal SVC. Mild hyperinflation of the upper lobes with crowding of interstitial lung markings in the lower lobes. Streaky parenchymal opacities at the left base some which likely reflect chronic post radiation change and/or atelectasis. Superimposed pneumonia would be difficult to entirely exclude but based on history of fall, believed less likely. No acute nor suspicious osseous abnormalities. No pneumothorax. IMPRESSION: 1. Aortic atherosclerosis. 2. Left basilar atelectasis, scarring and/or post radiated change. Superimposed subtle pneumonia is not entirely excluded but based on clinical history, believed less likely. 3. No acute fracture or pneumothorax. Electronically Signed   By: Ashley Royalty M.D.   On: 08/12/2017 23:41   Dg Lumbar Spine Complete  Result Date: 08/12/2017 CLINICAL DATA:  Pain after fall EXAM: LUMBAR SPINE - COMPLETE 4+ VIEW COMPARISON:  CXR 10/23/2016 FINDINGS: Lumbosacral transitional vertebral body. S1-S2 disc noted based on 12 ribbed thoracic vertebral bodies on the chest radiograph. The lowest square vertebral body will be labeled S1. No acute compression fracture. There is moderate disc space narrowing L4-5, L5-S1 and S1-S2 with associated degenerative facet arthropathy. Minimal grade 1 anterolisthesis of L5 on S1. Aortoiliac atherosclerosis noted without definite aneurysm. IMPRESSION: 1. Transitional lumbosacral vertebral body. 2. Lower lumbar degenerative disc and facet arthropathy L4 through S2. 3. No acute osseous abnormality. 4. Minimal grade  1 anterolisthesis of L5 on S1. Electronically Signed   By: Ashley Royalty M.D.   On: 08/12/2017 23:38   Ct Head Wo Contrast  Result Date: 08/12/2017 CLINICAL DATA:  Fall  while on anti coagulation therapy. EXAM: CT HEAD WITHOUT CONTRAST TECHNIQUE: Contiguous axial images were obtained from the base of the skull through the vertex without intravenous contrast. COMPARISON:  Brain MRI 06/04/2017 FINDINGS: Brain: No mass lesion, intraparenchymal hemorrhage or extra-axial collection. No evidence of acute cortical infarct. There is periventricular hypoattenuation compatible with chronic microvascular disease. Mild atrophy Vascular: No hyperdense vessel or unexpected calcification. Skull: Normal visualized skull base, calvarium and extracranial soft tissues. Sinuses/Orbits: No sinus fluid levels or advanced mucosal thickening. No mastoid effusion. Normal orbits. IMPRESSION: Mild atrophy and chronic microvascular disease without acute abnormality. Electronically Signed   By: Ulyses Jarred M.D.   On: 08/12/2017 23:29   Mr Thoracic Spine W Wo Contrast  Result Date: 08/13/2017 CLINICAL DATA:  Small cell lung cancer. Increased falls over the last 2 weeks. Right lower extremity pain and severe low back pain. Progressive numbness and weakness in the right lower extremity. Unable to walk. EXAM: MRI THORACIC AND LUMBAR SPINE WITHOUT AND WITH CONTRAST TECHNIQUE: Multiplanar and multiecho pulse sequences of the thoracic and lumbar spine were obtained without and with intravenous contrast. CONTRAST:  50mL MULTIHANCE GADOBENATE DIMEGLUMINE 529 MG/ML IV SOLN COMPARISON:  Lumbar spine radiographs 08/12/2017. PET scan 12/03/2016. FINDINGS: MRI THORACIC SPINE FINDINGS Alignment: AP alignment is anatomic. Rightward curvature of the thoracic spine is centered at T7-8. Vertebrae: Diffuse osseous metastatic disease is present. There is involvement of posterior elements on the right at T1 and bilaterally at T2. There is diffuse involvement of vertebral bodies throughout the remainder of the thoracic spine. Cord:  Normal signal is present throughout the thoracic spinal cord Paraspinal and other soft tissues:  The left infrahilar mass is increased. There is further collapse or metastatic disease into the left lung. Dependent right low lower lobe atelectasis is present. Bilateral effusions are noted, right greater than left. Numerous cystic lesions are again noted in the liver. Disc levels: T1-2: Facet hypertrophy contributes to foraminal narrowing, right greater than left. T2-3: Mild facet hypertrophy is present on the left without significant stenosis. T3-4:  No significant disc disease or stenosis. T4-5: Mild facet hypertrophy is present bilaterally without focal stenosis. T5-6: Facet hypertrophy is present bilaterally without focal stenosis. T6-7: Facet hypertrophy is present. Extraosseous tumor is present on the right without foraminal invasion. T7-8: Extraosseous tumor extends into the right neural foramen with moderate right foraminal stenosis. The left foramen is patent. T8-9: Extraosseous tumor is noted beyond the left foramen. Facet hypertrophy contributes to mild foraminal narrowing, left greater than right. T9-10: Disc disease and facet hypertrophy contributes to mild foraminal narrowing bilaterally. T10-11: Disc disease and facet hypertrophy contributes to mild right foraminal narrowing. A perineural root sleeve cyst is present on the left. T11-12: Metastatic disease involves the right rib proximally without focal stenosis. Facet spurring contributes to mild left foraminal narrowing. A perineural root sleeve cyst is present on the right. T12-L1: Schmorl's nodes are present without significant stenosis. MRI LUMBAR SPINE FINDINGS Segmentation:  Transitional anatomy is present at S1. Alignment: Degenerative anterolisthesis is present at S1-2 and L5-S1. There is slight degenerative retrolisthesis at L4-5. Vertebrae: Extensive osseous metastatic disease is present throughout the lumbar spine and sacrum. Conus medullaris: Extends to the L1 level and appears normal. Paraspinal and other soft tissues: Extraosseous  tumor is noted on the  right at S1. This likely impacts the right S1 foramen anteriorly. There is distention of the urinary bladder with wall thickening. This may be neurogenic. Disc levels: Diffuse extraosseous tumor is present in the posterior epidural space from L2-3 through L3-4. The enhancing soft tissue mass measures 4.6 x 0.8 cm on the sagittal images. The tumor is 1.3 cm wide. This results in severe central canal stenosis at L3. The canal is narrowed to 3 mm in the midline. L1-2: Facet hypertrophy contributes to mild foraminal narrowing bilaterally, worse on the right. L2-3: A broad-based disc protrusion is present. Mild facet hypertrophy is noted. There is no significant stenosis at this level. L3-4: In addition to tumor, a broad-based disc protrusion and facet hypertrophy contribute to moderate central canal stenosis. Moderate foraminal narrowing is worse on the left. L4-5: Extraosseous tumor extends into the right neural foramen resulting in severe right foraminal stenosis. Moderate left foraminal narrowing is due to disc disease and facet hypertrophy. Tumor also contributes to moderate right subarticular stenosis. L5-S1: A broad-based disc protrusion is present. Advanced facet hypertrophy is noted. This results an moderate subarticular stenosis bilaterally. Moderate left and mild right foraminal narrowing is present. Extraosseous tumor extends from the posterior elements into the right posterolateral aspect of the canal the superior right foramen. S1-2: Anterolisthesis results in uncovering of the disc with right greater than left subarticular and foraminal stenosis. IMPRESSION: 1. 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 The posterior tumor at L2-3 measures 4.6 x 0.8 x 1.3 cm. 2. Extraosseous tumor extends into the right foramen at L4-5 with severe right foraminal stenosis. 3. Extraosseous tumor and degenerative change contribute to moderate right  subarticular and mild right foraminal stenosis at L5-S1. 4. Typical degenerative changes are also present in the lumbar spine resulting in moderate foraminal narrowing bilaterally at L3-4, left greater than right. 5. Degenerate changes contribute to moderate left foraminal narrowing at L4-5. 6. Degenerative changes contribute to moderate left subarticular and foraminal narrowing at L5-S1. 7. Degenerative subarticular and foraminal narrowing at S1-2 is worse on the right. Prominent extraosseous tumor from the right sacral ala extends into the ventral soft tissues and likely impacts the ventral S1 foramen. 8. Diffuse osseous metastatic disease throughout the thoracic and lumbar spine. 9. Extraosseous tumor extends into the right neural foramen at T7-8 with moderate right foraminal stenosis. 10. Extraosseous tumor is noted just beyond the left foramen at T8-9. This could impact the left T8 nerve root. 11. No other focal stenosis of the thoracic spine due to tumor. 12. Multilevel degenerative change and foraminal narrowing in the thoracic spine due to the endplate change and facet disease. Critical Value/emergent results were called by telephone at the time of interpretation on 08/13/2017 at 6:34 pm to Dr. Randa Spike , who verbally acknowledged these results. Electronically Signed   By: San Morelle M.D.   On: 08/13/2017 18:55   Mr Lumbar Spine W Wo Contrast  Result Date: 08/13/2017 CLINICAL DATA:  Small cell lung cancer. Increased falls over the last 2 weeks. Right lower extremity pain and severe low back pain. Progressive numbness and weakness in the right lower extremity. Unable to walk. EXAM: MRI THORACIC AND LUMBAR SPINE WITHOUT AND WITH CONTRAST TECHNIQUE: Multiplanar and multiecho pulse sequences of the thoracic and lumbar spine were obtained without and with intravenous contrast. CONTRAST:  31mL MULTIHANCE GADOBENATE DIMEGLUMINE 529 MG/ML IV SOLN COMPARISON:  Lumbar spine radiographs 08/12/2017.  PET scan 12/03/2016. FINDINGS: MRI THORACIC SPINE FINDINGS Alignment:  AP alignment is anatomic. Rightward curvature of the thoracic spine is centered at T7-8. Vertebrae: Diffuse osseous metastatic disease is present. There is involvement of posterior elements on the right at T1 and bilaterally at T2. There is diffuse involvement of vertebral bodies throughout the remainder of the thoracic spine. Cord:  Normal signal is present throughout the thoracic spinal cord Paraspinal and other soft tissues: The left infrahilar mass is increased. There is further collapse or metastatic disease into the left lung. Dependent right low lower lobe atelectasis is present. Bilateral effusions are noted, right greater than left. Numerous cystic lesions are again noted in the liver. Disc levels: T1-2: Facet hypertrophy contributes to foraminal narrowing, right greater than left. T2-3: Mild facet hypertrophy is present on the left without significant stenosis. T3-4:  No significant disc disease or stenosis. T4-5: Mild facet hypertrophy is present bilaterally without focal stenosis. T5-6: Facet hypertrophy is present bilaterally without focal stenosis. T6-7: Facet hypertrophy is present. Extraosseous tumor is present on the right without foraminal invasion. T7-8: Extraosseous tumor extends into the right neural foramen with moderate right foraminal stenosis. The left foramen is patent. T8-9: Extraosseous tumor is noted beyond the left foramen. Facet hypertrophy contributes to mild foraminal narrowing, left greater than right. T9-10: Disc disease and facet hypertrophy contributes to mild foraminal narrowing bilaterally. T10-11: Disc disease and facet hypertrophy contributes to mild right foraminal narrowing. A perineural root sleeve cyst is present on the left. T11-12: Metastatic disease involves the right rib proximally without focal stenosis. Facet spurring contributes to mild left foraminal narrowing. A perineural root sleeve cyst is  present on the right. T12-L1: Schmorl's nodes are present without significant stenosis. MRI LUMBAR SPINE FINDINGS Segmentation:  Transitional anatomy is present at S1. Alignment: Degenerative anterolisthesis is present at S1-2 and L5-S1. There is slight degenerative retrolisthesis at L4-5. Vertebrae: Extensive osseous metastatic disease is present throughout the lumbar spine and sacrum. Conus medullaris: Extends to the L1 level and appears normal. Paraspinal and other soft tissues: Extraosseous tumor is noted on the right at S1. This likely impacts the right S1 foramen anteriorly. There is distention of the urinary bladder with wall thickening. This may be neurogenic. Disc levels: Diffuse extraosseous tumor is present in the posterior epidural space from L2-3 through L3-4. The enhancing soft tissue mass measures 4.6 x 0.8 cm on the sagittal images. The tumor is 1.3 cm wide. This results in severe central canal stenosis at L3. The canal is narrowed to 3 mm in the midline. L1-2: Facet hypertrophy contributes to mild foraminal narrowing bilaterally, worse on the right. L2-3: A broad-based disc protrusion is present. Mild facet hypertrophy is noted. There is no significant stenosis at this level. L3-4: In addition to tumor, a broad-based disc protrusion and facet hypertrophy contribute to moderate central canal stenosis. Moderate foraminal narrowing is worse on the left. L4-5: Extraosseous tumor extends into the right neural foramen resulting in severe right foraminal stenosis. Moderate left foraminal narrowing is due to disc disease and facet hypertrophy. Tumor also contributes to moderate right subarticular stenosis. L5-S1: A broad-based disc protrusion is present. Advanced facet hypertrophy is noted. This results an moderate subarticular stenosis bilaterally. Moderate left and mild right foraminal narrowing is present. Extraosseous tumor extends from the posterior elements into the right posterolateral aspect of the  canal the superior right foramen. S1-2: Anterolisthesis results in uncovering of the disc with right greater than left subarticular and foraminal stenosis. IMPRESSION: 1. Severe central canal stenosis at L3 secondary to extraosseous posterior epidural  tumor. The posterior tumor at L2-3 measures 4.6 x 0.8 x 1.3 cm. 2. Extraosseous tumor extends into the right foramen at L4-5 with severe right foraminal stenosis. 3. Extraosseous tumor and degenerative change contribute to moderate right subarticular and mild right foraminal stenosis at L5-S1. 4. Typical degenerative changes are also present in the lumbar spine resulting in moderate foraminal narrowing bilaterally at L3-4, left greater than right. 5. Degenerate changes contribute to moderate left foraminal narrowing at L4-5. 6. Degenerative changes contribute to moderate left subarticular and foraminal narrowing at L5-S1. 7. Degenerative subarticular and foraminal narrowing at S1-2 is worse on the right. Prominent extraosseous tumor from the right sacral ala extends into the ventral soft tissues and likely impacts the ventral S1 foramen. 8. Diffuse osseous metastatic disease throughout the thoracic and lumbar spine. 9. Extraosseous tumor extends into the right neural foramen at T7-8 with moderate right foraminal stenosis. 10. Extraosseous tumor is noted just beyond the left foramen at T8-9. This could impact the left T8 nerve root. 11. No other focal stenosis of the thoracic spine due to tumor. 12. Multilevel degenerative change and foraminal narrowing in the thoracic spine due to the endplate change and facet disease. Critical Value/emergent results were called by telephone at the time of interpretation on 08/13/2017 at 6:34 pm to Dr. Randa Spike , who verbally acknowledged these results. Electronically Signed   By: San Morelle M.D.   On: 08/13/2017 18:55   Dg Foot Complete Right  Result Date: 08/12/2017 CLINICAL DATA:  Bruising of the right toes after  fall today. EXAM: RIGHT FOOT COMPLETE - 3+ VIEW COMPARISON:  None. FINDINGS: Hallux rigidus of the great toe with marked joint space narrowing and spurring of the first MTP. No acute fracture or malalignment is noted. There is dorsal soft tissue swelling of the forefoot. There is a tiny plantar calcaneal enthesophyte. IMPRESSION: 1. Hallux rigidus of the first MTP with marked joint space narrowing and spurring. 2. No acute osseous abnormality. 3. Dorsal soft tissue swelling over the forefoot. 4. Small plantar calcaneal enthesophyte. Electronically Signed   By: Ashley Royalty M.D.   On: 08/12/2017 23:32   Dg Hip Unilat With Pelvis 1v Left  Result Date: 08/12/2017 CLINICAL DATA:  Loss of balance and fell off bed.  Left hip pain. EXAM: DG HIP (WITH OR WITHOUT PELVIS) 1V*L* COMPARISON:  None. FINDINGS: There is no evidence of hip fracture or dislocation. There is degenerative disc space narrowing at L4-5. L5-S1 degenerative facet hypertrophy and sclerosis. No suspicious osseous lesions of the bony pelvis and hips. Intact sacroiliac joints and pubic symphysis. IMPRESSION: No acute fracture or malalignment of either hip. There is lower lumbar facet arthropathy and disc disease. Electronically Signed   By: Ashley Royalty M.D.   On: 08/12/2017 23:30    ASSESSMENT/PLAN:  1. Small cell lung cancer, left (HCC) - follow-up with radiation oncology, had palliative consult with [facility]   2. Lumbar spine tumor - follow-up with radiation oncology, continue dexamethasone 4 mg 1 tab twice a day, for rehabilitation with PT and OT for therapeutic strengthening exercises, fall precautions   3. Atrial fibrillation, chronic (HCC) - rate controlled, continue Eliquis 5 mg 1 tablet twice a day   4. Urinary tract infection without hematuria, site unspecified - completed 5 day course of antibiotic   5. Chronic depression  - mood is stable, continue Escitalopram 10 mg 1 tab daily and Zyprexa 5 mg 1 tab daily at bedtime   6. Reflux  esophagitis - stable,  continue Protonix 20 mg 1 tab daily   7. Benign prostatic hyperplasia with urinary obstruction - continue Flomax 0.4 mg 1 capsule daily   8. Chronic pain due to neoplasm - continue morphine IR 15 mg 1 tab every 6 hours when necessary, fentanyl 25 g/hour 1 patch transdermal every 3 days and gabapentin 300 mg 1 capsule 3 times a day     I have filled out patient's discharge paperwork and written prescriptions.    DME provided:  None  Total discharge time: Less than 30 minutes  Discharge time involved coordination of the discharge process with social worker, nursing staff and therapy department.     Monina C. [place] - NP    [employer] [phone]

## 2017-08-31 NOTE — Progress Notes (Signed)
  Radiation Oncology         [phone]) [phone] ________________________________  Name: Christian Munoz MRN: [id number]  Date: 08/27/2017  DOB: 07-05-1935  End of Treatment Note  Diagnosis:   82 y.o. man patient with lumbar metastasis     Indication for treatment:  Palliative      Radiation treatment dates:   08/16/2017 to 08/27/2017  Site/dose:   The L-Spine was treated to 30 Gy in 10 fractions of 3 Gy.  Beams/energy:   3D // 15X  Narrative: The patient tolerated radiation treatment relatively well.   Strength is stable in the lower extremities, 4 out of 5 in the right lower extremity and 5 out of 5 in the left lower extremity. The patient had no concerns or complaints with treatment. Stated appetite is fair.   Plan: The patient has completed radiation treatment. The patient will return to radiation oncology clinic for routine followup in one month. I advised them to call or return sooner if they have any questions or concerns related to their recovery or treatment.  ------------------------------------------------  Jodelle Gross, MD, PhD  This document serves as a record of services personally performed by Kyung Rudd, MD. It was created on his behalf by Arlyce Harman, a trained medical scribe. The creation of this record is based on the scribe's personal observations and the provider's statements to them. This document has been checked and approved by the attending provider.

## 2017-09-14 ENCOUNTER — Ambulatory Visit: Payer: Medicare Other | Admitting: Internal Medicine

## 2017-09-15 ENCOUNTER — Telehealth: Payer: Self-pay | Admitting: *Deleted

## 2017-09-15 NOTE — Telephone Encounter (Signed)
Pt additted to hospice

## 2017-10-12 DEATH — deceased

## 2017-10-13 ENCOUNTER — Inpatient Hospital Stay: Admission: RE | Admit: 2017-10-13 | Payer: Self-pay | Source: Ambulatory Visit | Admitting: Radiation Oncology

## 2017-11-22 ENCOUNTER — Telehealth: Payer: Self-pay

## 2017-11-22 ENCOUNTER — Ambulatory Visit: Payer: PRIVATE HEALTH INSURANCE | Admitting: Nurse Practitioner

## 2017-11-22 NOTE — Telephone Encounter (Signed)
I called patient's son, Vicente Males, to see if patient was still in SNF. Patient is on schedule for today at 2:15.

## 2017-11-23 NOTE — Telephone Encounter (Signed)
Patient passed in December 2018.

## 2017-12-23 ENCOUNTER — Other Ambulatory Visit: Payer: Self-pay | Admitting: Nurse Practitioner

## 2018-05-03 IMAGING — MR MR HEAD WO/W CM
10 of 13 series · 35 of 48 positions shown · IV contrast (multihance)
Comparison: Brain MRI 12/05/2016.

CLINICAL DATA: 81-year-old male with limited stage small cell lung
cancer. Restaging.

EXAM:
MRI HEAD WITHOUT AND WITH CONTRAST
TECHNIQUE: Multiplanar, multiecho pulse sequences of the brain and surrounding
structures were obtained without and with intravenous contrast.
CONTRAST:  14mL MULTIHANCE GADOBENATE DIMEGLUMINE 529 MG/ML IV SOLN

[Series 3: DWI · axial · 3.0mm · 1.09mm/px · z∈[-28,+134]mm · 8 of 110 slices shown (1 of 4)]
[im 1/110]
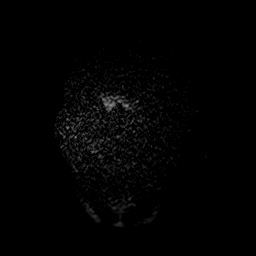
[im 13/110]
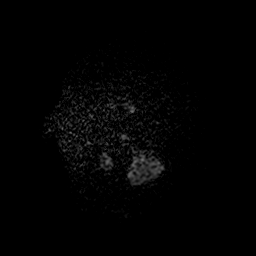
[im 37/110]
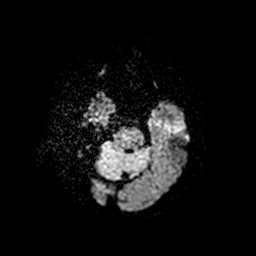
[im 49/110]
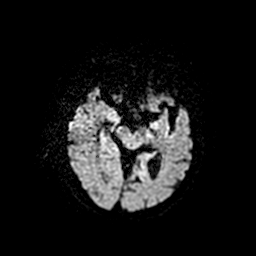
[im 61/110]
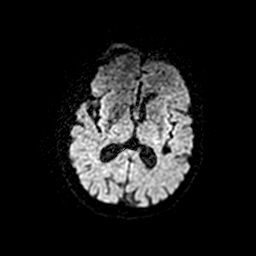
[im 73/110]
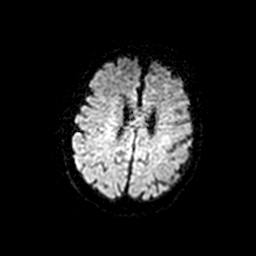
[im 97/110]
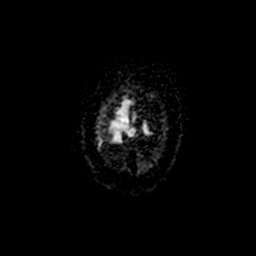
[im 110/110]
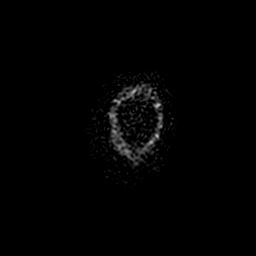

[Series 4: T1 · sagittal · 5.0mm · 0.47mm/px · 2 of 25 slices shown]
[im 1/25]
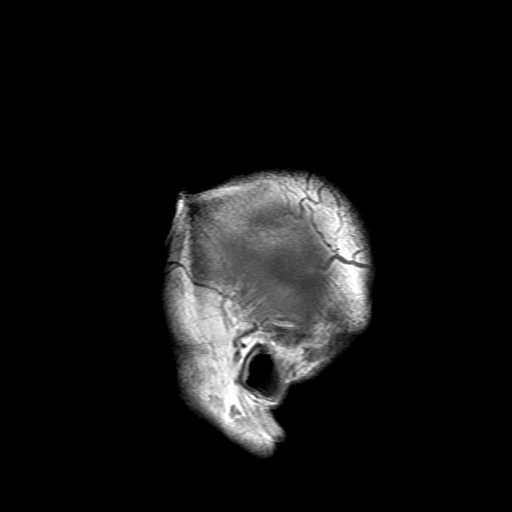
[im 13/25]
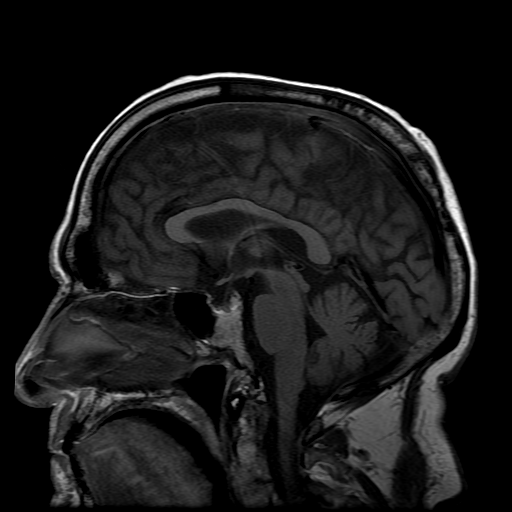

[Series 5: DWI · coronal · 5.0mm · 1.09mm/px · 7 of 78 slices shown (2 of 4)]
[im 1/78]
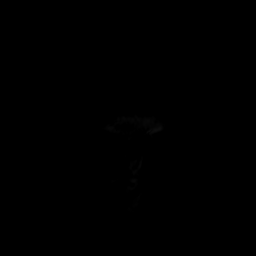
[im 13/78]
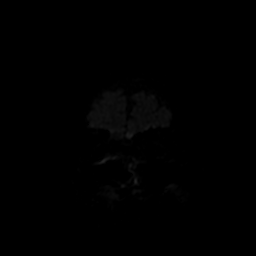
[im 26/78]
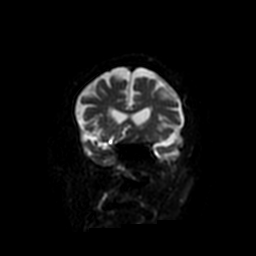
[im 39/78]
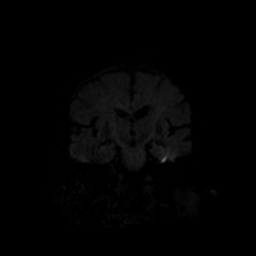
[im 52/78]
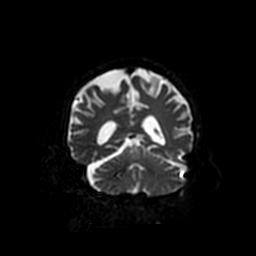
[im 65/78]
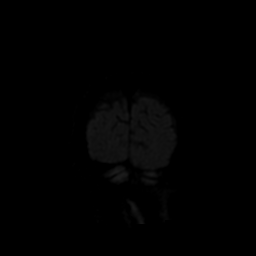
[im 78/78]
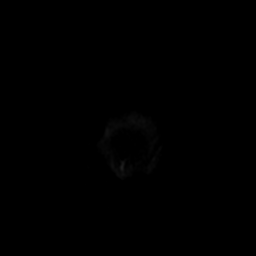

[Series 6: T2 · axial · 5.0mm · 0.43mm/px · z∈[+15,+166]mm · 2 of 23 slices shown]
[im 1/23]
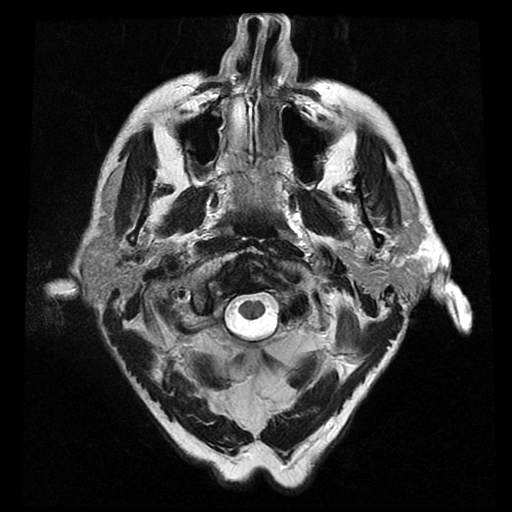
[im 23/23]
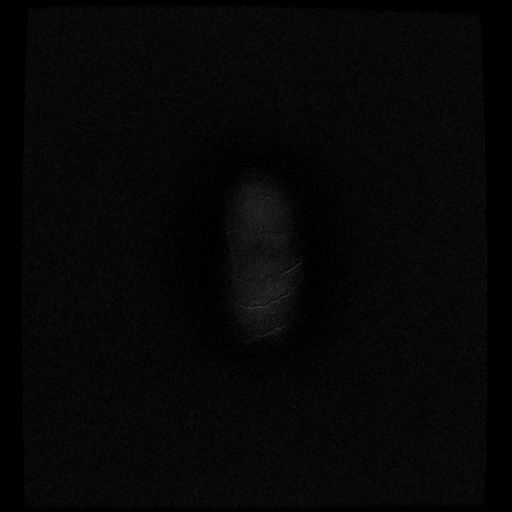

[Series 7: FLAIR · axial · 3.0mm · 0.43mm/px · z∈[+25,+166]mm · 2 of 25 slices shown]
[im 1/25]
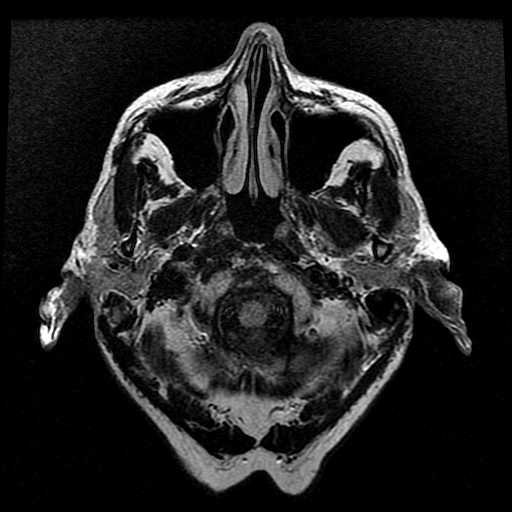
[im 25/25]
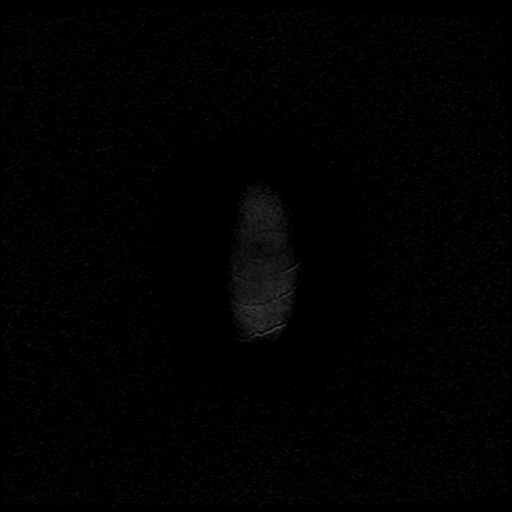

[Series 10: T2 post-contrast · coronal · 5.0mm · 0.45mm/px · 2 of 29 slices shown]
[im 1/29]
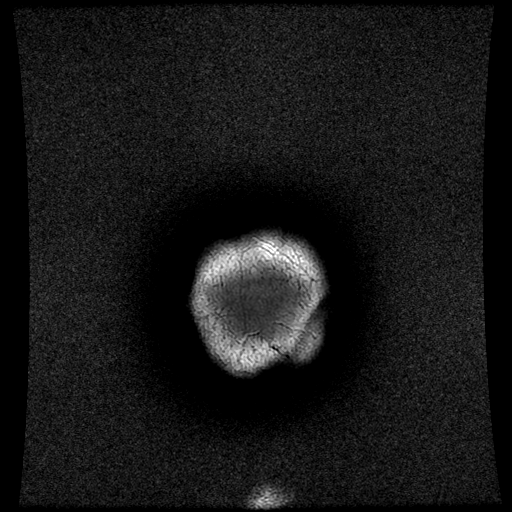
[im 29/29]
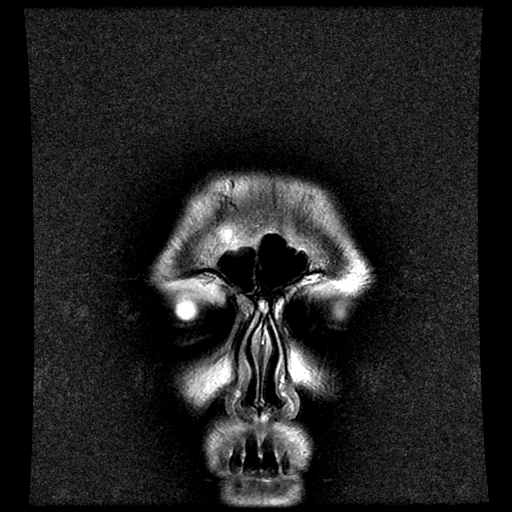

[Series 12: T1 post-contrast · coronal · 5.0mm · 0.45mm/px · 2 of 29 slices shown (1 of 2)]
[im 1/29]
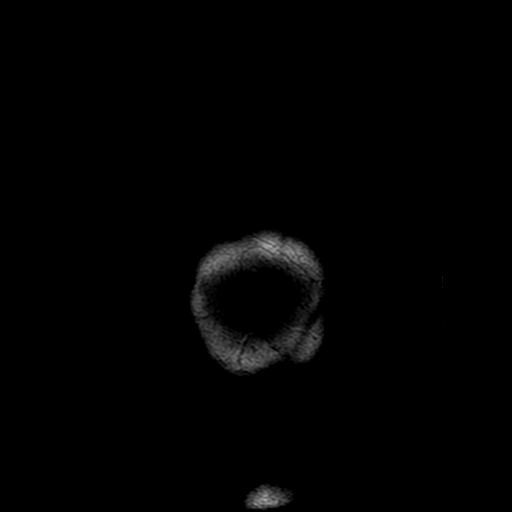
[im 29/29]
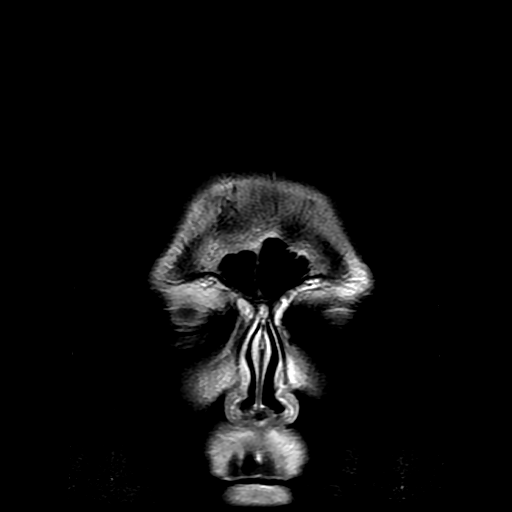

[Series 13: T1 post-contrast · sagittal · 5.0mm · 0.47mm/px · 2 of 25 slices shown (2 of 2)]
[im 1/25]
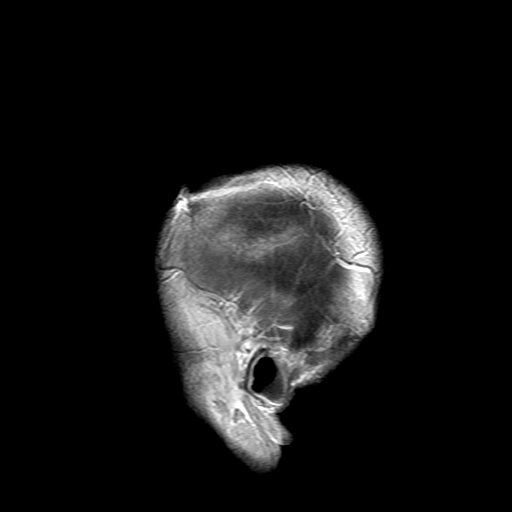
[im 25/25]
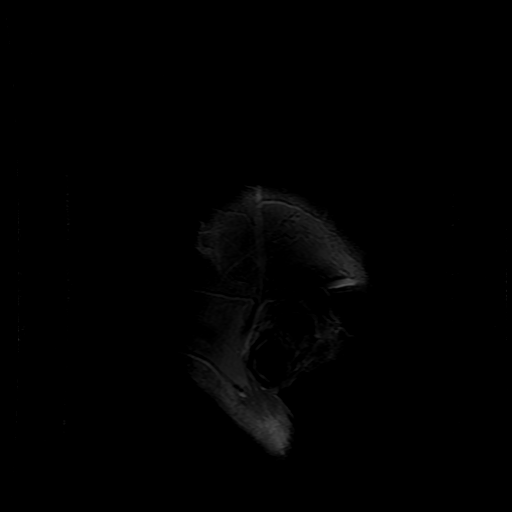

[Series 300: DWI · axial · 3.0mm · 1.09mm/px · z∈[-28,+134]mm · 5 of 55 slices shown (3 of 4)]
[im 1/55]
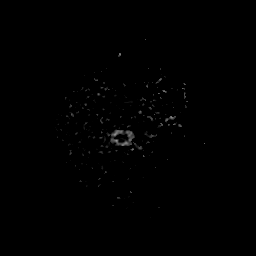
[im 14/55]
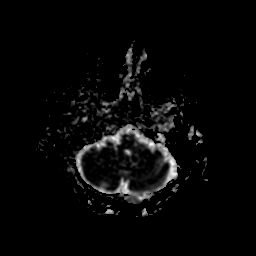
[im 28/55]
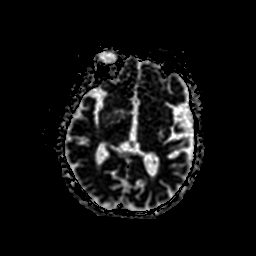
[im 41/55]
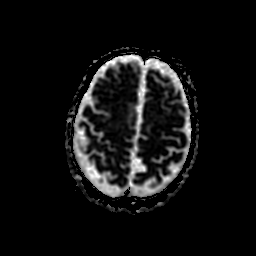
[im 55/55]
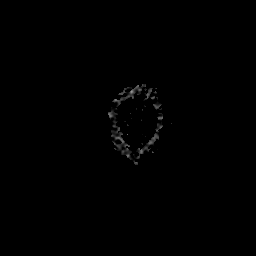

[Series 500: DWI · coronal · 5.0mm · 1.09mm/px · 3 of 39 slices shown (4 of 4)]
[im 1/39]
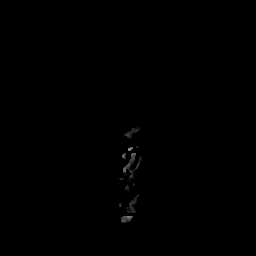
[im 20/39]
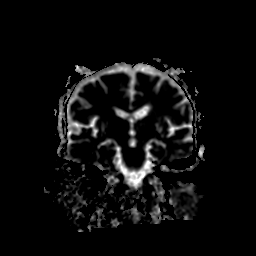
[im 39/39]
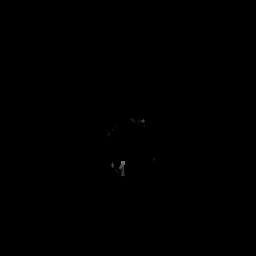

[35 of 48 positions shown; findings below may reference images not displayed]

FINDINGS: Brain: Asymmetric pachymeningeal thickening and enhancement over the
right superior convexity appears stable (series 11, image 33 and
series 12, image 16). No other dural thickening identified. No other
abnormal intracranial enhancement identified.

Stable cerebral volume. No restricted diffusion to suggest acute
infarction. No midline shift, mass effect, evidence of mass lesion,
ventriculomegaly, extra-axial collection or acute intracranial
hemorrhage. Cervicomedullary junction and pituitary are within
normal limits. Stable gray and white matter signal throughout the
brain. No chronic cerebral blood products.

Vascular: Major intracranial vascular flow voids are stable.

Skull and upper cervical spine: Stable visualized cervical spine and
spinal cord. Visualized bone marrow signal is within normal limits.

Sinuses/Orbits: Stable and negative.

Other: Visible internal auditory structures appear normal. Visible
scalp and face soft tissues appear negative.
IMPRESSION: 1. Mild smooth right superior convexity dural thickening and
enhancement is noted, but is stable since [REDACTED] and appears
benign. Perhaps this is the sequelae of prior trauma. Attention is
directed on future restaging MRI.
2.  No metastatic disease or acute intracranial abnormality.
